# Patient Record
Sex: Male | Born: 1950
Health system: Southern US, Community
[De-identification: ages and names within clinical notes are randomized; demographics above are authoritative.]

## PROBLEM LIST (undated history)

## (undated) DIAGNOSIS — I499 Cardiac arrhythmia, unspecified: Secondary | ICD-10-CM

## (undated) DIAGNOSIS — I10 Essential (primary) hypertension: Secondary | ICD-10-CM

## (undated) DIAGNOSIS — I209 Angina pectoris, unspecified: Secondary | ICD-10-CM

## (undated) DIAGNOSIS — M1990 Unspecified osteoarthritis, unspecified site: Secondary | ICD-10-CM

## (undated) DIAGNOSIS — I4901 Ventricular fibrillation: Secondary | ICD-10-CM

## (undated) DIAGNOSIS — I2510 Atherosclerotic heart disease of native coronary artery without angina pectoris: Secondary | ICD-10-CM

## (undated) DIAGNOSIS — R911 Solitary pulmonary nodule: Secondary | ICD-10-CM

## (undated) DIAGNOSIS — I4819 Other persistent atrial fibrillation: Secondary | ICD-10-CM

## (undated) DIAGNOSIS — G4733 Obstructive sleep apnea (adult) (pediatric): Secondary | ICD-10-CM

## (undated) DIAGNOSIS — I517 Cardiomegaly: Secondary | ICD-10-CM

## (undated) DIAGNOSIS — I77810 Thoracic aortic ectasia: Secondary | ICD-10-CM

## (undated) DIAGNOSIS — I340 Nonrheumatic mitral (valve) insufficiency: Secondary | ICD-10-CM

## (undated) DIAGNOSIS — Z9989 Dependence on other enabling machines and devices: Secondary | ICD-10-CM

## (undated) DIAGNOSIS — E785 Hyperlipidemia, unspecified: Secondary | ICD-10-CM

## (undated) DIAGNOSIS — I351 Nonrheumatic aortic (valve) insufficiency: Secondary | ICD-10-CM

## (undated) HISTORY — PX: CARTILAGE SURGERY: SHX1303

## (undated) HISTORY — PX: KNEE ARTHROSCOPY: SHX127

## (undated) HISTORY — DX: Essential (primary) hypertension: I10

## (undated) HISTORY — PX: TOTAL KNEE ARTHROPLASTY: SHX125

## (undated) HISTORY — PX: RETINAL DETACHMENT SURGERY: SHX105

## (undated) HISTORY — DX: Other persistent atrial fibrillation: I48.19

## (undated) HISTORY — DX: Hyperlipidemia, unspecified: E78.5

## (undated) HISTORY — DX: Nonrheumatic aortic (valve) insufficiency: I35.1

## (undated) HISTORY — PX: JOINT REPLACEMENT: SHX530

## (undated) HISTORY — PX: CORONARY ANGIOPLASTY WITH STENT PLACEMENT: SHX49

## (undated) HISTORY — PX: CATARACT EXTRACTION W/ INTRAOCULAR LENS  IMPLANT, BILATERAL: SHX1307

## (undated) HISTORY — DX: Solitary pulmonary nodule: R91.1

## (undated) HISTORY — DX: Nonrheumatic mitral (valve) insufficiency: I34.0

## (undated) HISTORY — DX: Cardiomegaly: I51.7

## (undated) HISTORY — DX: Thoracic aortic ectasia: I77.810

## (undated) HISTORY — PX: COLONOSCOPY: SHX174

---

## 2014-12-05 DIAGNOSIS — Z23 Encounter for immunization: Secondary | ICD-10-CM | POA: Diagnosis not present

## 2015-03-13 DIAGNOSIS — M542 Cervicalgia: Secondary | ICD-10-CM | POA: Diagnosis not present

## 2015-03-13 DIAGNOSIS — I10 Essential (primary) hypertension: Secondary | ICD-10-CM | POA: Diagnosis not present

## 2015-03-13 DIAGNOSIS — R29898 Other symptoms and signs involving the musculoskeletal system: Secondary | ICD-10-CM | POA: Diagnosis not present

## 2015-03-13 DIAGNOSIS — G4733 Obstructive sleep apnea (adult) (pediatric): Secondary | ICD-10-CM | POA: Diagnosis not present

## 2015-03-16 DIAGNOSIS — I10 Essential (primary) hypertension: Secondary | ICD-10-CM | POA: Diagnosis not present

## 2015-03-24 DIAGNOSIS — I10 Essential (primary) hypertension: Secondary | ICD-10-CM | POA: Diagnosis not present

## 2015-04-04 DIAGNOSIS — G4733 Obstructive sleep apnea (adult) (pediatric): Secondary | ICD-10-CM | POA: Diagnosis not present

## 2015-04-11 DIAGNOSIS — R809 Proteinuria, unspecified: Secondary | ICD-10-CM | POA: Insufficient documentation

## 2015-04-11 DIAGNOSIS — I10 Essential (primary) hypertension: Secondary | ICD-10-CM | POA: Insufficient documentation

## 2015-04-11 DIAGNOSIS — E785 Hyperlipidemia, unspecified: Secondary | ICD-10-CM | POA: Diagnosis not present

## 2015-04-20 DIAGNOSIS — R809 Proteinuria, unspecified: Secondary | ICD-10-CM | POA: Diagnosis not present

## 2015-05-17 DIAGNOSIS — R351 Nocturia: Secondary | ICD-10-CM | POA: Diagnosis not present

## 2015-05-17 DIAGNOSIS — I10 Essential (primary) hypertension: Secondary | ICD-10-CM | POA: Diagnosis not present

## 2015-06-16 ENCOUNTER — Encounter (HOSPITAL_COMMUNITY): Payer: Self-pay

## 2015-06-16 ENCOUNTER — Inpatient Hospital Stay (HOSPITAL_COMMUNITY)
Admission: EM | Admit: 2015-06-16 | Discharge: 2015-06-20 | DRG: 246 | Disposition: A | Discharge status: Home / Self Care | Payer: Medicare Other | Attending: Cardiology | Admitting: Cardiology

## 2015-06-16 ENCOUNTER — Emergency Department (HOSPITAL_COMMUNITY): Payer: Medicare Other

## 2015-06-16 DIAGNOSIS — M7989 Other specified soft tissue disorders: Secondary | ICD-10-CM | POA: Diagnosis not present

## 2015-06-16 DIAGNOSIS — I10 Essential (primary) hypertension: Secondary | ICD-10-CM | POA: Diagnosis not present

## 2015-06-16 DIAGNOSIS — I480 Paroxysmal atrial fibrillation: Secondary | ICD-10-CM | POA: Diagnosis present

## 2015-06-16 DIAGNOSIS — Z955 Presence of coronary angioplasty implant and graft: Secondary | ICD-10-CM

## 2015-06-16 DIAGNOSIS — R911 Solitary pulmonary nodule: Secondary | ICD-10-CM

## 2015-06-16 DIAGNOSIS — I97710 Intraoperative cardiac arrest during cardiac surgery: Secondary | ICD-10-CM | POA: Diagnosis not present

## 2015-06-16 DIAGNOSIS — Z7902 Long term (current) use of antithrombotics/antiplatelets: Secondary | ICD-10-CM

## 2015-06-16 DIAGNOSIS — Z96652 Presence of left artificial knee joint: Secondary | ICD-10-CM | POA: Diagnosis present

## 2015-06-16 DIAGNOSIS — I200 Unstable angina: Secondary | ICD-10-CM | POA: Diagnosis present

## 2015-06-16 DIAGNOSIS — E785 Hyperlipidemia, unspecified: Secondary | ICD-10-CM | POA: Diagnosis present

## 2015-06-16 DIAGNOSIS — Z7982 Long term (current) use of aspirin: Secondary | ICD-10-CM

## 2015-06-16 DIAGNOSIS — I4811 Longstanding persistent atrial fibrillation: Secondary | ICD-10-CM | POA: Diagnosis present

## 2015-06-16 DIAGNOSIS — R079 Chest pain, unspecified: Secondary | ICD-10-CM | POA: Diagnosis not present

## 2015-06-16 DIAGNOSIS — I2510 Atherosclerotic heart disease of native coronary artery without angina pectoris: Secondary | ICD-10-CM

## 2015-06-16 DIAGNOSIS — I4819 Other persistent atrial fibrillation: Secondary | ICD-10-CM | POA: Diagnosis present

## 2015-06-16 DIAGNOSIS — I4901 Ventricular fibrillation: Secondary | ICD-10-CM | POA: Diagnosis present

## 2015-06-16 DIAGNOSIS — Z79899 Other long term (current) drug therapy: Secondary | ICD-10-CM

## 2015-06-16 DIAGNOSIS — I25110 Atherosclerotic heart disease of native coronary artery with unstable angina pectoris: Secondary | ICD-10-CM | POA: Diagnosis not present

## 2015-06-16 DIAGNOSIS — I4891 Unspecified atrial fibrillation: Secondary | ICD-10-CM | POA: Diagnosis not present

## 2015-06-16 DIAGNOSIS — IMO0001 Reserved for inherently not codable concepts without codable children: Secondary | ICD-10-CM

## 2015-06-16 DIAGNOSIS — R0789 Other chest pain: Secondary | ICD-10-CM | POA: Diagnosis not present

## 2015-06-16 HISTORY — DX: Solitary pulmonary nodule: R91.1

## 2015-06-16 HISTORY — DX: Ventricular fibrillation: I49.01

## 2015-06-16 HISTORY — DX: Atherosclerotic heart disease of native coronary artery without angina pectoris: I25.10

## 2015-06-16 HISTORY — DX: Reserved for inherently not codable concepts without codable children: IMO0001

## 2015-06-16 LAB — COMPREHENSIVE METABOLIC PANEL
ALBUMIN: 4.1 g/dL (ref 3.5–5.0)
ALK PHOS: 35 U/L — AB (ref 38–126)
ALT: 38 U/L (ref 17–63)
ANION GAP: 6 (ref 5–15)
AST: 29 U/L (ref 15–41)
BILIRUBIN TOTAL: 0.8 mg/dL (ref 0.3–1.2)
BUN: 26 mg/dL — AB (ref 6–20)
CALCIUM: 8.7 mg/dL — AB (ref 8.9–10.3)
CO2: 25 mmol/L (ref 22–32)
Chloride: 109 mmol/L (ref 101–111)
Creatinine, Ser: 0.96 mg/dL (ref 0.61–1.24)
GFR calc non Af Amer: >60 mL/min (ref >60)
GLUCOSE: 109 mg/dL — AB (ref 65–99)
Potassium: 3.9 mmol/L (ref 3.5–5.1)
Sodium: 140 mmol/L (ref 135–145)
Total Protein: 7 g/dL (ref 6.5–8.1)

## 2015-06-16 LAB — PROTIME-INR
INR: 1.09 (ref 0.00–1.49)
PROTHROMBIN TIME: 14.3 s (ref 11.6–15.2)

## 2015-06-16 LAB — I-STAT TROPONIN, ED
TROPONIN I, POC: 0 ng/mL (ref 0.00–0.08)
Troponin i, poc: 0.01 ng/mL (ref 0.00–0.08)

## 2015-06-16 LAB — CBC WITH DIFFERENTIAL/PLATELET
BASOS PCT: 0 % (ref 0–1)
Basophils Absolute: 0 10*3/uL (ref 0.0–0.1)
EOS PCT: 2 % (ref 0–5)
Eosinophils Absolute: 0.1 10*3/uL (ref 0.0–0.7)
HCT: 40.8 % (ref 39.0–52.0)
HEMOGLOBIN: 13.5 g/dL (ref 13.0–17.0)
LYMPHS PCT: 35 % (ref 12–46)
Lymphs Abs: 2.1 10*3/uL (ref 0.7–4.0)
MCH: 29.2 pg (ref 26.0–34.0)
MCHC: 33.1 g/dL (ref 30.0–36.0)
MCV: 88.1 fL (ref 78.0–100.0)
Monocytes Absolute: 0.5 10*3/uL (ref 0.1–1.0)
Monocytes Relative: 8 % (ref 3–12)
NEUTROS ABS: 3.3 10*3/uL (ref 1.7–7.7)
NEUTROS PCT: 55 % (ref 43–77)
PLATELETS: 152 10*3/uL (ref 150–400)
RBC: 4.63 MIL/uL (ref 4.22–5.81)
RDW: 13.7 % (ref 11.5–15.5)
WBC: 6 10*3/uL (ref 4.0–10.5)

## 2015-06-16 LAB — TROPONIN I
Troponin I: <0.03 ng/mL (ref <0.031)
Troponin I: <0.03 ng/mL (ref <0.031)
Troponin I: <0.03 ng/mL (ref <0.031)

## 2015-06-16 LAB — BRAIN NATRIURETIC PEPTIDE: B Natriuretic Peptide: 314.3 pg/mL — ABNORMAL HIGH (ref 0.0–100.0)

## 2015-06-16 LAB — TSH: TSH: 0.899 u[IU]/mL (ref 0.350–4.500)

## 2015-06-16 LAB — APTT: APTT: 31 s (ref 24–37)

## 2015-06-16 LAB — HEPARIN LEVEL (UNFRACTIONATED): HEPARIN UNFRACTIONATED: 0.22 [IU]/mL — AB (ref 0.30–0.70)

## 2015-06-16 IMAGING — CT CT ANGIO CHEST
2 of 6 series · 17 of 46 positions shown · IV contrast (omnipaque)
Comparison: No priors.

CLINICAL DATA: 63-year-old male with chest tightness and shortness
of breath. Feet swelling.

EXAM:
CT ANGIOGRAPHY CHEST WITH CONTRAST
TECHNIQUE: Multidetector CT imaging of the chest was performed using the
standard protocol during bolus administration of intravenous
contrast. Multiplanar CT image reconstructions and MIPs were
obtained to evaluate the vascular anatomy.
CONTRAST:  100mL OMNIPAQUE IOHEXOL 350 MG/ML SOLN

[Series 6: thins for pacs · axial · 0.88mm/px · z∈[-395,-106]mm · 14 of 317 slices shown]
[im 14/317  lung]
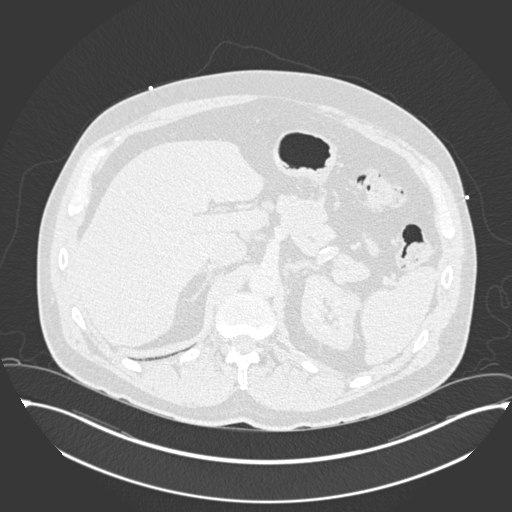
[im 42/317  soft-tissue]
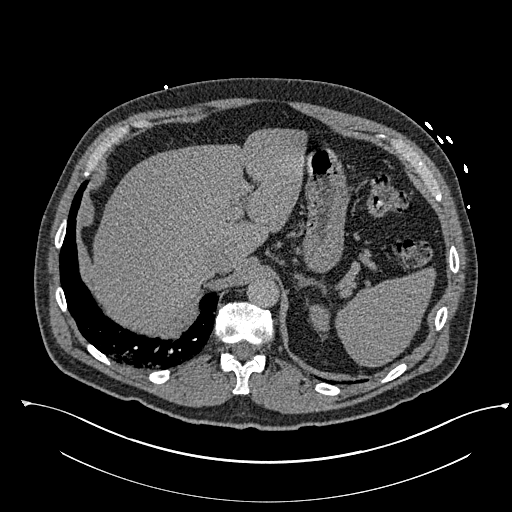
[im 55/317  lung]
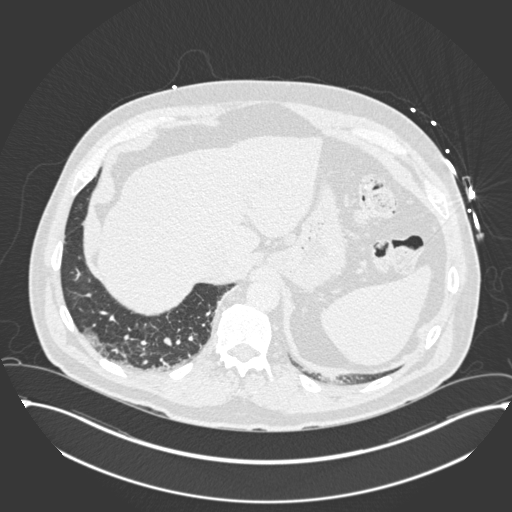
[im 83/317  soft-tissue]
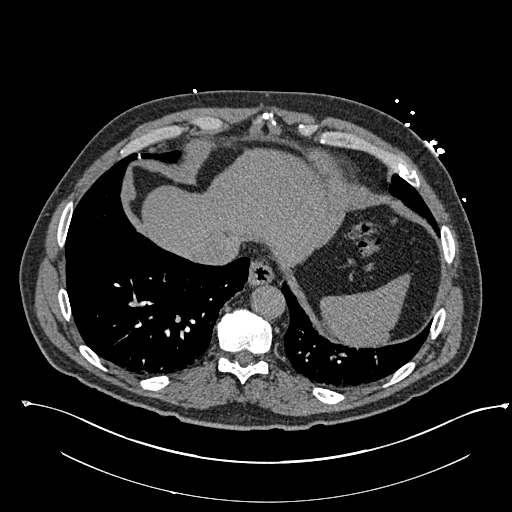
[im 110/317  lung]
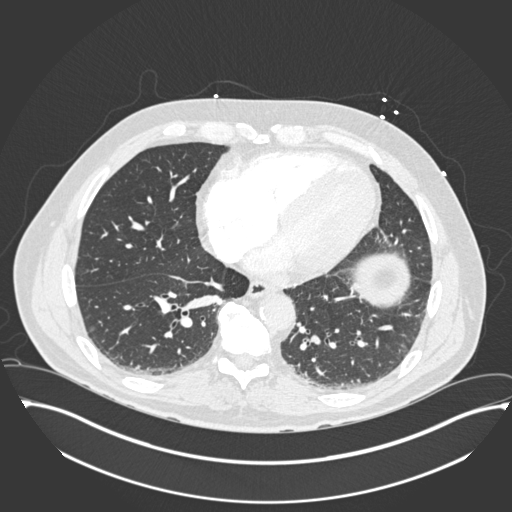
[im 124/317  soft-tissue]
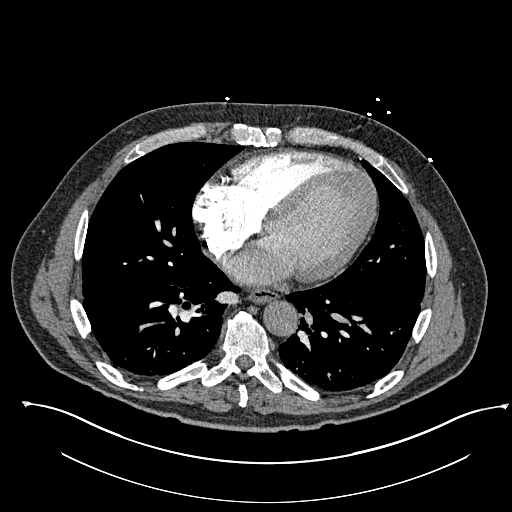
[im 152/317  lung]
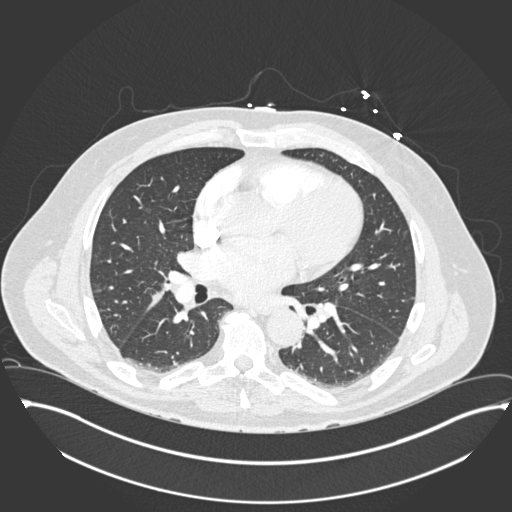
[im 165/317  soft-tissue]
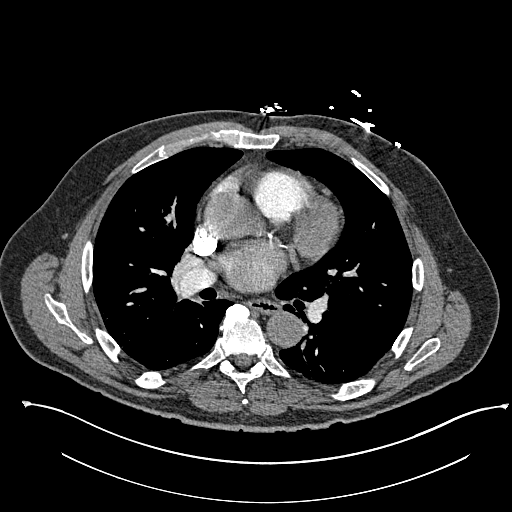
[im 193/317  lung]
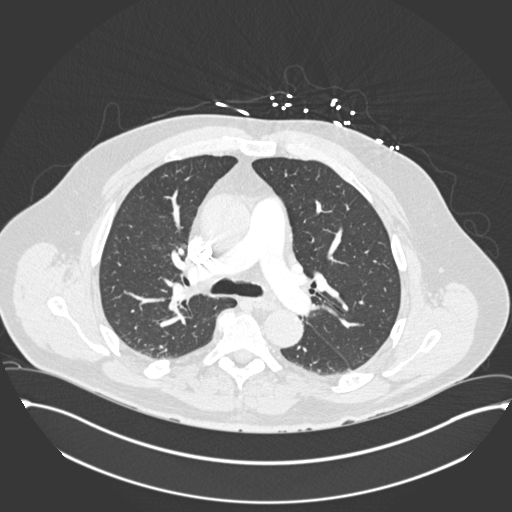
[im 207/317  soft-tissue]
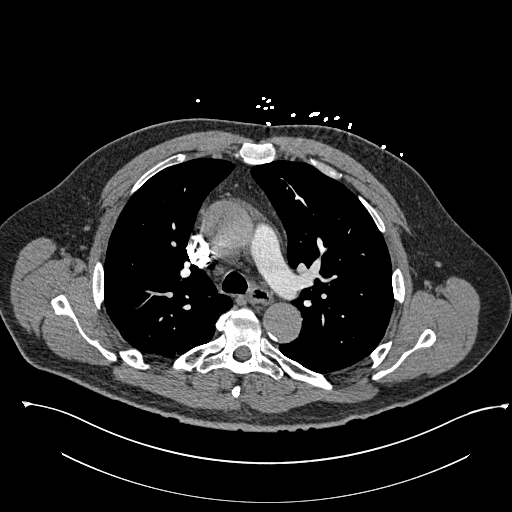
[im 234/317  lung]
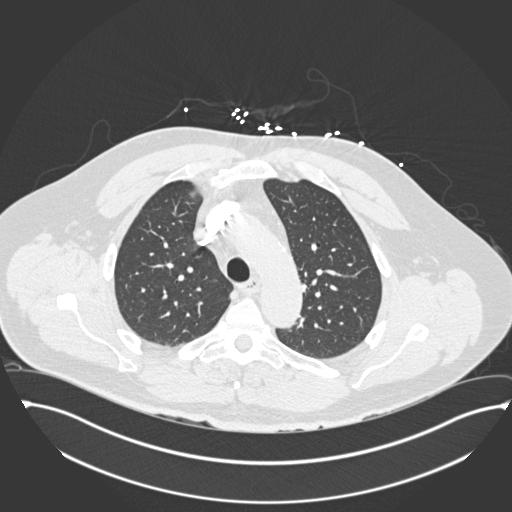
[im 262/317  soft-tissue]
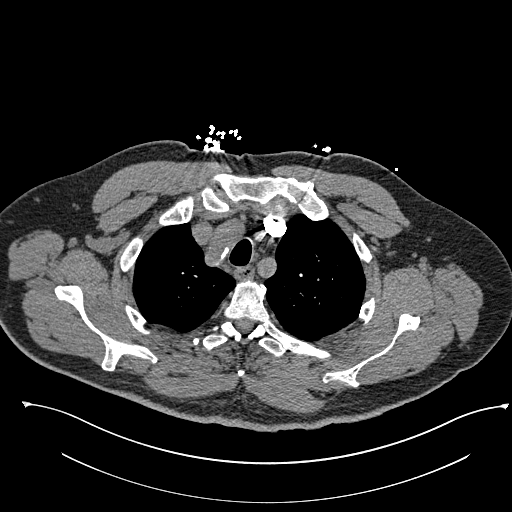
[im 275/317  lung]
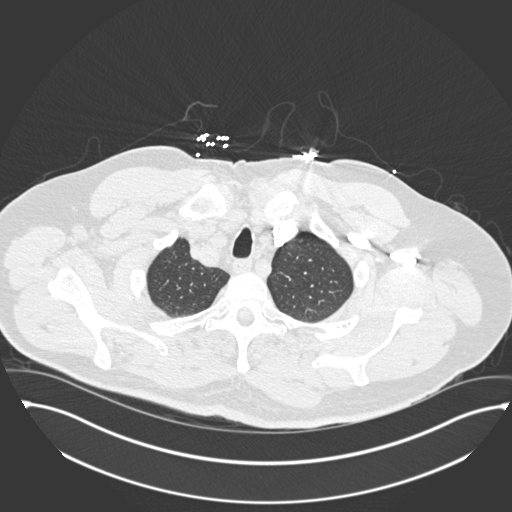
[im 303/317  soft-tissue]
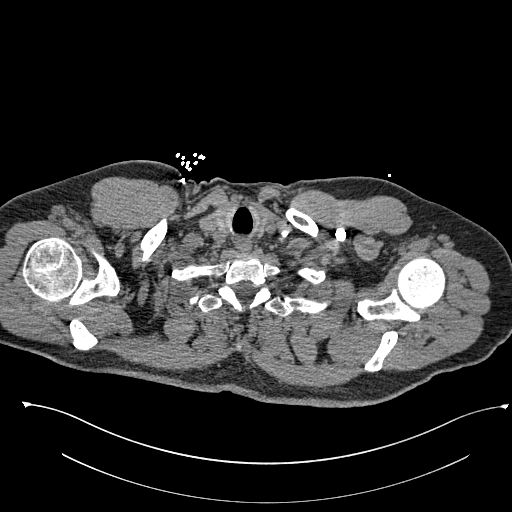

[Series 8: coronal mpr · coronal · 0.62mm/px · 3 of 151 slices shown]
[im 38/151  soft-tissue]
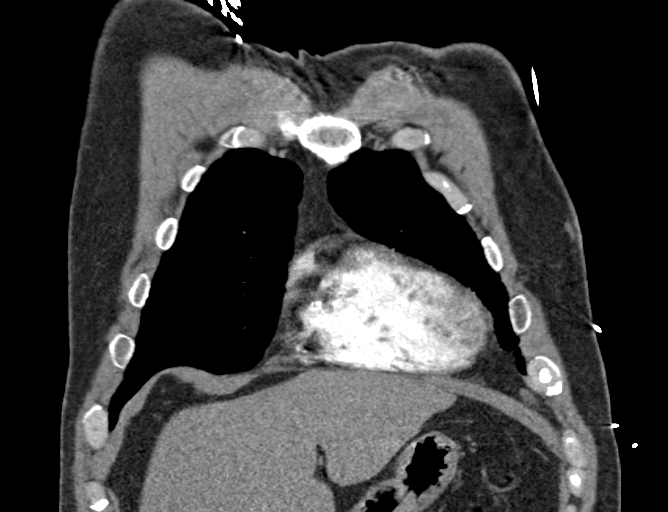
[im 76/151  soft-tissue]
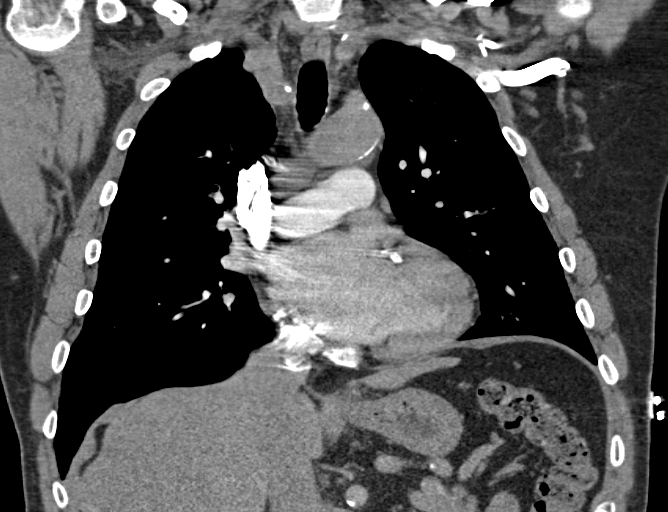
[im 113/151  soft-tissue]
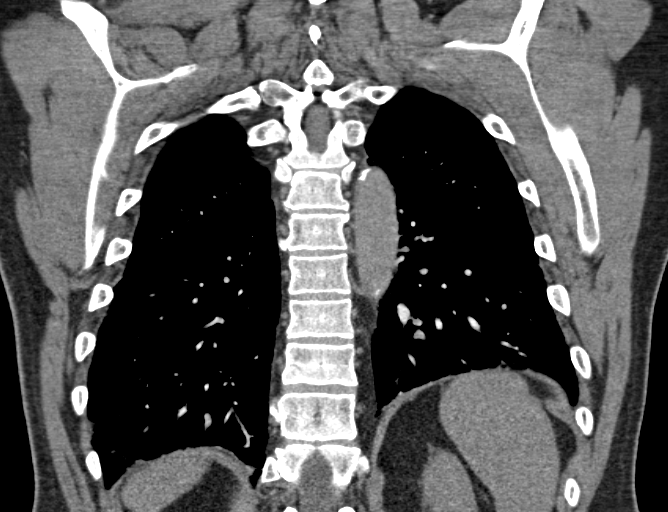

[17 of 46 positions shown; findings below may reference images not displayed]

FINDINGS: Mediastinum/Lymph Nodes: There are no filling defects within the
pulmonary arterial tree to suggest underlying pulmonary embolism.
Heart size is borderline enlarged. There is no significant
pericardial fluid, thickening or pericardial calcification. There is
atherosclerosis of the thoracic aorta, the great vessels of the
mediastinum and the coronary arteries, including calcified
atherosclerotic plaque in the left main, left anterior descending,
left circumflex and right coronary arteries. No pathologically
enlarged mediastinal or hilar lymph nodes. Esophagus is unremarkable
in appearance. No axillary lymphadenopathy.

Lungs/Pleura: In the central aspect of the right lower lobe (image
60 of series 7) there is a 1.5 x 1.2 cm smoothly marginated nodule.
No acute consolidative airspace disease. No pleural effusions. Mild
dependent subsegmental atelectasis is noted in the lower lobes of
the lungs bilaterally.

Upper Abdomen: Unremarkable.

Musculoskeletal/Soft Tissues: There are no aggressive appearing
lytic or blastic lesions noted in the visualized portions of the
skeleton.

Review of the MIP images confirms the above findings.
IMPRESSION: 1. No evidence of pulmonary embolism.
2. No acute findings in the thorax to account for the patient's
symptoms.
3. 1.5 x 1.2 cm smoothly marginated nodule in the central aspect of
the right lower lobe (image 60 of series 7). Correlation with
nonemergent PET-CT is recommended in the near future to exclude
neoplasm.
4. Atherosclerosis, including left main and 3 vessel coronary artery
disease. Please note that although the presence of coronary artery
calcium documents the presence of coronary artery disease, the
severity of this disease and any potential stenosis cannot be
assessed on this non-gated CT examination. Assessment for potential
risk factor modification, dietary therapy or pharmacologic therapy
may be warranted, if clinically indicated.

## 2015-06-16 MED ORDER — SODIUM CHLORIDE 0.9 % IV SOLN: INTRAVENOUS | Status: DC

## 2015-06-16 MED ORDER — HEPARIN BOLUS VIA INFUSION
4000.0000 [IU] | Freq: Once | INTRAVENOUS | Status: AC
  Administered 2015-06-16: 4000 [IU] via INTRAVENOUS
  Filled 2015-06-16: qty 4000

## 2015-06-16 MED ORDER — ATORVASTATIN CALCIUM 10 MG PO TABS
10.0000 mg | ORAL_TABLET | Freq: Every day | ORAL | Status: DC
  Administered 2015-06-16: 10 mg via ORAL
  Filled 2015-06-16: qty 1

## 2015-06-16 MED ORDER — HEPARIN (PORCINE) IN NACL 100-0.45 UNIT/ML-% IJ SOLN
1350.0000 [IU]/h | INTRAMUSCULAR | Status: DC
  Administered 2015-06-16 – 2015-06-17 (×2): 1350 [IU]/h via INTRAVENOUS
  Filled 2015-06-16: qty 250

## 2015-06-16 MED ORDER — SODIUM CHLORIDE 0.9 % IV SOLN
INTRAVENOUS | Status: DC
  Administered 2015-06-16 – 2015-06-18 (×6): via INTRAVENOUS

## 2015-06-16 MED ORDER — ONDANSETRON HCL 4 MG/2ML IJ SOLN
4.0000 mg | Freq: Four times a day (QID) | INTRAMUSCULAR | Status: DC | PRN
Start: 2015-06-16 — End: 2015-06-20

## 2015-06-16 MED ORDER — SODIUM CHLORIDE 0.9 % IV BOLUS (SEPSIS)
500.0000 mL | Freq: Once | INTRAVENOUS | Status: AC
  Administered 2015-06-16: 500 mL via INTRAVENOUS

## 2015-06-16 MED ORDER — METOPROLOL TARTRATE 25 MG PO TABS
25.0000 mg | ORAL_TABLET | Freq: Two times a day (BID) | ORAL | Status: DC
  Administered 2015-06-16 (×2): 25 mg via ORAL
  Filled 2015-06-16 (×2): qty 1

## 2015-06-16 MED ORDER — MORPHINE SULFATE 2 MG/ML IJ SOLN
2.0000 mg | INTRAMUSCULAR | Status: DC | PRN
  Administered 2015-06-18 – 2015-06-19 (×3): 2 mg via INTRAVENOUS
  Filled 2015-06-16 (×3): qty 1

## 2015-06-16 MED ORDER — ASPIRIN EC 325 MG PO TBEC
325.0000 mg | DELAYED_RELEASE_TABLET | Freq: Every day | ORAL | Status: DC
  Filled 2015-06-16: qty 1

## 2015-06-16 MED ORDER — HEPARIN (PORCINE) IN NACL 100-0.45 UNIT/ML-% IJ SOLN
1200.0000 [IU]/h | INTRAMUSCULAR | Status: DC
  Administered 2015-06-16: 1200 [IU]/h via INTRAVENOUS
  Filled 2015-06-16: qty 250

## 2015-06-16 MED ORDER — LOSARTAN POTASSIUM 50 MG PO TABS
100.0000 mg | ORAL_TABLET | Freq: Every day | ORAL | Status: DC
  Filled 2015-06-16: qty 2

## 2015-06-16 MED ORDER — GI COCKTAIL ~~LOC~~
30.0000 mL | Freq: Four times a day (QID) | ORAL | Status: DC | PRN
  Administered 2015-06-18 – 2015-06-19 (×2): 30 mL via ORAL
  Filled 2015-06-16 (×5): qty 30

## 2015-06-16 MED ORDER — NITROGLYCERIN 0.4 MG SL SUBL
0.4000 mg | SUBLINGUAL_TABLET | SUBLINGUAL | Status: DC | PRN
  Administered 2015-06-16: 0.4 mg via SUBLINGUAL
  Filled 2015-06-16: qty 1

## 2015-06-16 MED ORDER — SODIUM CHLORIDE (HYPERTONIC) 2 % OP SOLN: 1.0000 [drp] | OPHTHALMIC | Status: DC | PRN

## 2015-06-16 MED ORDER — OMEGA-3-ACID ETHYL ESTERS 1 G PO CAPS
1.0000 g | ORAL_CAPSULE | Freq: Two times a day (BID) | ORAL | Status: DC
  Administered 2015-06-16 – 2015-06-20 (×8): 1 g via ORAL
  Filled 2015-06-16 (×8): qty 1

## 2015-06-16 MED ORDER — FISH OIL 500 MG PO CAPS: 2000.0000 mg | ORAL_CAPSULE | Freq: Every day | ORAL | Status: DC

## 2015-06-16 MED ORDER — ACETAMINOPHEN 325 MG PO TABS: 650.0000 mg | ORAL_TABLET | ORAL | Status: DC | PRN

## 2015-06-16 MED ORDER — IOHEXOL 350 MG/ML SOLN
100.0000 mL | Freq: Once | INTRAVENOUS | Status: AC | PRN
  Administered 2015-06-16: 100 mL via INTRAVENOUS

## 2015-06-16 NOTE — ED Notes (Signed)
Awake. Verbally responsive. A/O x4. Resp even and unlabored. No audible adventitious breath sounds noted. ABC's intact. A.FIb on monitor. Family at bedside.

## 2015-06-16 NOTE — ED Provider Notes (Signed)
CSN: [id number]     Arrival date & time 06/16/15  0808 History   First MD Initiated Contact with Patient 06/16/15 0830     Chief Complaint  Patient presents with  . Chest Pain  . Shortness of Breath  . Foot Swelling     (Consider location/radiation/quality/duration/timing/severity/associated sxs/prior Treatment) HPI Troy [name] is a 64 y.o. male with history of hypertension and coronary artery disease, presents to emergency department complaining of chest tightness, shortness of breath, dizziness onset yesterday. Patient states he is watching television when his chest tightness began. He states pain is in the center of the chest, it is worse with deep breathing. He states he is unable to walk but 10 steps before getting short of breath. He states he has some dizziness as well. He denies diaphoresis. He denies cough or chest congestion. He denies nausea, vomiting. He states symptoms feel like when he had his stent placed approximately 8 years ago in [place]. He states he has not seen cardiologist or primary care doctor several years. He states he did just have physical few months ago and everything was normal. He denies recent stress test. He denies any recent EKGs. He states he has noticed over last several months that his heart rate would be high even without exerting himself. He denies palpitations however. He states that he travels a lot by car and has been driving back and forth to [place]. He reports mild swelling in bilateral legs. He denies any calf pain. Patient take 700 mg of aspirin this morning prior to coming in.  Past Medical History  Diagnosis Date  . Hypertension    Past Surgical History  Procedure Laterality Date  . Replacement total knee Left   . Coronary angioplasty with stent placement     History reviewed. No pertinent family history. History  Substance Use Topics  . Smoking status: Never Smoker   . Smokeless tobacco: Not on file  . Alcohol Use: Yes   Comment: occ    Review of Systems  Constitutional: Negative for fever and chills.  Respiratory: Positive for chest tightness and shortness of breath. Negative for cough.   Cardiovascular: Positive for chest pain and leg swelling. Negative for palpitations.  Gastrointestinal: Negative for nausea, vomiting, abdominal pain, diarrhea and abdominal distention.  Genitourinary: Negative for dysuria, urgency, frequency and hematuria.  Musculoskeletal: Negative for myalgias, arthralgias, neck pain and neck stiffness.  Skin: Negative for rash.  Allergic/Immunologic: Negative for immunocompromised state.  Neurological: Positive for dizziness and light-headedness. Negative for weakness, numbness and headaches.  All other systems reviewed and are negative.     Allergies  Review of patient's allergies indicates not on file.  Home Medications   Prior to Admission medications   Not on File   BP 151/103 mmHg  Pulse 118  Temp(Src) 97.5 F (36.4 C) (Oral)  Resp 19  SpO2 94% Physical Exam  Constitutional: He is oriented to person, place, and time. He appears well-developed and well-nourished. No distress.  HENT:  Head: Normocephalic and atraumatic.  Eyes: Conjunctivae are normal.  Neck: Neck supple.  Cardiovascular: An irregularly irregular rhythm present. Tachycardia present.   No murmur heard. Pulmonary/Chest: Effort normal. No respiratory distress. He has no wheezes. He has no rales.  Abdominal: Soft. Bowel sounds are normal. He exhibits no distension. There is no tenderness. There is no rebound.  Musculoskeletal: He exhibits no edema.  Neurological: He is alert and oriented to person, place, and time.  Skin: Skin is warm and dry.  Nursing note and vitals reviewed.   ED Course  Procedures (including critical care time) Labs Review Labs Reviewed  COMPREHENSIVE METABOLIC PANEL - Abnormal; Notable for the following:    Glucose, Bld 109 (*)    BUN 26 (*)    Calcium 8.7 (*)     Alkaline Phosphatase 35 (*)    All other components within normal limits  BRAIN NATRIURETIC PEPTIDE - Abnormal; Notable for the following:    B Natriuretic Peptide 314.3 (*)    All other components within normal limits  CBC WITH DIFFERENTIAL/PLATELET  I-STAT TROPOININ, ED  I-STAT TROPOININ, ED    Imaging Review Ct Angio Chest Pe W/cm &/or Wo Cm  06/16/2015   CLINICAL DATA:  64 year old male with chest tightness and shortness of breath. Feet swelling.  EXAM: CT ANGIOGRAPHY CHEST WITH CONTRAST  TECHNIQUE: Multidetector CT imaging of the chest was performed using the standard protocol during bolus administration of intravenous contrast. Multiplanar CT image reconstructions and MIPs were obtained to evaluate the vascular anatomy.  CONTRAST:  140mL OMNIPAQUE IOHEXOL 350 MG/ML SOLN  COMPARISON:  No priors.  FINDINGS: Mediastinum/Lymph Nodes: There are no filling defects within the pulmonary arterial tree to suggest underlying pulmonary embolism. Heart size is borderline enlarged. There is no significant pericardial fluid, thickening or pericardial calcification. There is atherosclerosis of the thoracic aorta, the great vessels of the mediastinum and the coronary arteries, including calcified atherosclerotic plaque in the left main, left anterior descending, left circumflex and right coronary arteries. No pathologically enlarged mediastinal or hilar lymph nodes. Esophagus is unremarkable in appearance. No axillary lymphadenopathy.  Lungs/Pleura: In the central aspect of the right lower lobe (image 60 of series 7) there is a 1.5 x 1.2 cm smoothly marginated nodule. No acute consolidative airspace disease. No pleural effusions. Mild dependent subsegmental atelectasis is noted in the lower lobes of the lungs bilaterally.  Upper Abdomen: Unremarkable.  Musculoskeletal/Soft Tissues: There are no aggressive appearing lytic or blastic lesions noted in the visualized portions of the skeleton.  Review of the MIP images  confirms the above findings.  IMPRESSION: 1. No evidence of pulmonary embolism. 2. No acute findings in the thorax to account for the patient's symptoms. 3. 1.5 x 1.2 cm smoothly marginated nodule in the central aspect of the right lower lobe (image 60 of series 7). Correlation with nonemergent PET-CT is recommended in the near future to exclude neoplasm. 4. Atherosclerosis, including left main and 3 vessel coronary artery disease. Please note that although the presence of coronary artery calcium documents the presence of coronary artery disease, the severity of this disease and any potential stenosis cannot be assessed on this non-gated CT examination. Assessment for potential risk factor modification, dietary therapy or pharmacologic therapy may be warranted, if clinically indicated.   Electronically Signed   By: Vinnie Langton M.D.   On: 06/16/2015 11:14     EKG Interpretation   Date/Time:  Saturday June 16 2015 08:19:23 EDT Ventricular Rate:  109 PR Interval:    QRS Duration: 105 QT Interval:  361 QTC Calculation: 486 R Axis:   45 Text Interpretation:  Atrial fibrillation Borderline prolonged QT interval  Non-specific abnormality, ST segment, and/or T-wave No previous ECGs  available Confirmed by KNOTT MD, DANIEL ([id number]) on 06/16/2015 8:44:56 AM      MDM   Final diagnoses:  Chest pain, unspecified chest pain type  Atrial fibrillation, unspecified    Patient emergency department complaining of chest tightness, pleuritic pain, shortness of breath, dizziness. Patient travels  long distances by car, he is tachycardic, oxygen saturation in low 90s on the monitor, concerning for possible PE. Will get CT angio of chest. Patient also was found to be in new onset of atrial fibrillation. Unclear how long he has been in this rhythm. Will check labs including troponin, BNP, patient took 700 mg aspirin this morning.  11:45 AM Spoke with Dr. Aundra Dubin, asked medicine to admit, they will consult  tomorrow morning. Pt's VS normal, pain improved with nitro.  Pt's CT as above, no PE. Will get delta trop, call cardiology. Symptoms concerning for unstable angina.  Spoke with cardiology, though consult tomorrow. Asked medicine to admit. Will call triad.      Jeannett Senior, PA-C 06/17/15 1954  Leo Grosser, MD 06/17/15 [phone]

## 2015-06-16 NOTE — ED Notes (Signed)
Awake. Verbally responsive. A/O x4. Resp even and unlabored. No audible adventitious breath sounds noted. ABC's intact. A. Fib on monitor. IV saline lock patent and intact. Family at bedside.

## 2015-06-16 NOTE — Progress Notes (Signed)
ANTICOAGULATION CONSULT NOTE - Initial Consult  Pharmacy Consult for heparin drip.  Indication: chest pain/ACS  Allergies  Allergen Reactions  . Gluten Meal Other (See Comments)    Celiac disease  . Whey     Celiac disease  . Tizanidine Anxiety    Depression    Patient Measurements: Height: 5\' 11"  (180.3 cm) Weight: 241 lb (109.317 kg) IBW/kg (Calculated) : 75.3 Heparin Dosing Weight: 100.7 kg   Vital Signs: Temp: 97.6 F (36.4 C) (08/06 1351) Temp Source: Oral (08/06 1351) BP: 125/89 mmHg (08/06 1351) Pulse Rate: 89 (08/06 1351)  Labs:  Recent Labs  06/16/15 0850  HGB 13.5  HCT 40.8  PLT 152  CREATININE 0.96    Estimated Creatinine Clearance: 96.5 mL/min (by C-G formula based on Cr of 0.96).   Medical History: Past Medical History  Diagnosis Date  . Hypertension     Medications:  Scheduled:  . aspirin EC  325 mg Oral Daily  . atorvastatin  10 mg Oral q1800  . heparin  4,000 Units Intravenous Once  . losartan  100 mg Oral Daily  . metoprolol tartrate  25 mg Oral BID  . omega-3 acid ethyl esters  1 g Oral BID    Assessment: Pt is  64 y.o. male with a PMH of establishedcoronary artery disease undergoing percutaneous intervention in 2010 and [place], [place]. Pt complains of chest discomfort that has become worse in last 24 hours. Concern for unstable angina. Pt also has been newly diagnosed with atrial fibrillation. No PTA anti-coagulants. Pt on aspirin as outpatient. CBC WNL. APTT, PT/INR WNL.     Goal of Therapy:  Heparin level 0.3-0.7 units/ml Monitor platelets by anticoagulation protocol: Yes   Plan:  Give 4000 units bolus x 1, and start heparin infusion at 1200 units/hr. Will order anti-Xa 6 hours after start of infusion and has been ordered daily. CBC ordered daily. Abigail Butts Lynkin Saini 06/16/15, 14:40

## 2015-06-16 NOTE — ED Notes (Signed)
Patient transported to CT 

## 2015-06-16 NOTE — Progress Notes (Signed)
ANTICOAGULATION CONSULT NOTE - Follow Up Consult  Pharmacy Consult for Heparin Indication: chest pain/ACS and atrial fibrillation  Allergies  Allergen Reactions  . Gluten Meal Other (See Comments)    Celiac disease  . Whey     Celiac disease  . Tizanidine Anxiety    Depression    Patient Measurements: Height: 5\' 11"  (180.3 cm) Weight: 241 lb (109.317 kg) IBW/kg (Calculated) : 75.3 Heparin Dosing Weight:   Vital Signs: Temp: 98.4 F (36.9 C) (08/06 2126) Temp Source: Oral (08/06 2126) BP: 119/81 mmHg (08/06 2126) Pulse Rate: 103 (08/06 2126)  Labs:  Recent Labs  06/16/15 0850 06/16/15 1406 06/16/15 1418 06/16/15 1708 06/16/15 1945 06/16/15 2116  HGB 13.5  --   --   --   --   --   HCT 40.8  --   --   --   --   --   PLT 152  --   --   --   --   --   APTT  --   --  31  --   --   --   LABPROT  --   --  14.3  --   --   --   INR  --   --  1.09  --   --   --   HEPARINUNFRC  --   --   --   --   --  0.22*  CREATININE 0.96  --   --   --   --   --   TROPONINI  --  <0.03  --  <0.03 <0.03  --     Estimated Creatinine Clearance: 96.5 mL/min (by C-G formula based on Cr of 0.96).   Medications:  Infusions:  . sodium chloride 125 mL/hr at 06/16/15 2319  . heparin 1,350 Units/hr (06/16/15 2319)    Assessment: Patient with heparin level low.  No heparin issues per RN.   Goal of Therapy:  Heparin level 0.3-0.7 units/ml Monitor platelets by anticoagulation protocol: Yes   Plan:  Increase heparin to 1350 units/hr Recheck level at [place], Shea Stakes [place] 06/16/2015,11:22 PM

## 2015-06-16 NOTE — ED Notes (Signed)
Pt c/o central chest tightness, stabbing chest pain w/ deep breathing, SOB w/ exertion, and BLE swelling x 1 day.  Pain score 5/10.  Pt reports that he was watching tv when the symptoms started.  Pt reports having stents placed 5-6 years ago.

## 2015-06-16 NOTE — H&P (Addendum)
[facility] History and Physical  Troy [name] DXA:[id number] DOB: January 19, 1951 DOA: 06/16/2015  Referring physician:  PCP: No PCP Per Patient   Chief Complaint: Chest Pain  HPI: Troy [name] is a 64 y.o. male with a past medical history of establishedcoronary artery disease undergoing percutaneous intervention in 2010 and [place], [place]. He presents to the emergency department with complaints of intermittent chest pain and shortness of breath the past 2-3 months becoming worse in the past 24 hours. He complains of retrosternal chest pain characterized as tight/pressure like, radiating to his right shoulder, precipitated by physical exertion. He also complains of significant dyspnea on exertion and states becoming significantly short of breath with ambulating 10-20 feet. He denies associated nausea or vomiting. In the emergency department he was found to be in atrial fibrillation. Workup included a CT scan of lungs with IV contrast which not reveal evidence for pulmonary embolism. He states that current symptoms are similar to symptoms he had back in 2010 and led to his cardiac catheterization.                                                                                                                           Review of Systems:  Constitutional:  No weight loss, night sweats, Fevers, chills, fatigue.  HEENT:  No headaches, Difficulty swallowing,Tooth/dental problems,Sore throat,  No sneezing, itching, ear ache, nasal congestion, post nasal drip,  Cardio-vascular:  Positive for chest pain, Orthopnea, PND, swelling in lower extremities, anasarca, dizziness, palpitations  GI:  No heartburn, indigestion, abdominal pain, nausea, vomiting, diarrhea, change in bowel habits, loss of appetite  Resp:  Positive for shortness of breath with exertion or at rest. No excess mucus, no productive cough, No non-productive cough, No coughing up of blood.No change in color of mucus.No  wheezing.No chest wall deformity  Skin:  no rash or lesions.  GU:  no dysuria, change in color of urine, no urgency or frequency. No flank pain.  Musculoskeletal:  No joint pain or swelling. No decreased range of motion. No back pain.  Psych:  No change in mood or affect. No depression or anxiety. No memory loss.   Past Medical History  Diagnosis Date  . Hypertension    Past Surgical History  Procedure Laterality Date  . Replacement total knee Left   . Coronary angioplasty with stent placement     Social History:  reports that he has never smoked. He does not have any smokeless tobacco history on file. He reports that he drinks alcohol. He reports that he does not use illicit drugs.  Allergies  Allergen Reactions  . Whey     Celiac disease  . Tizanidine Anxiety    Depression    History reviewed. No pertinent family history.   Prior to Admission medications   Medication Sig Start Date End Date Taking? Authorizing Provider  aspirin 325 MG EC tablet Take 700 mg by mouth once.   Yes Historical Provider, MD  atenolol (TENORMIN)  25 MG tablet Take 25 mg by mouth daily.   Yes Historical Provider, MD  losartan-hydrochlorothiazide (HYZAAR) 100-25 MG per tablet Take 1 tablet by mouth daily.   Yes Historical Provider, MD  Omega-3 Fatty Acids (FISH OIL) 500 MG CAPS Take 2,000 mg by mouth daily.    Yes Historical Provider, MD  sodium chloride (MURO 128) 2 % ophthalmic solution Place 1 drop into both eyes every 4 (four) hours as needed for irritation.   Yes Historical Provider, MD  atorvastatin (LIPITOR) 10 MG tablet Take 10 mg by mouth daily.  04/11/15 04/10/16  Historical Provider, MD   Physical Exam: Filed Vitals:   06/16/15 1030 06/16/15 1100 06/16/15 1115 06/16/15 1130  BP: 122/78 129/90 115/65 130/98  Pulse: 88 95 96 79  Temp:    97.5 F (36.4 C)  TempSrc:    Oral  Resp: 0 14 17 14   SpO2: 88% 94% 89% 94%    Wt Readings from Last 3 Encounters:  No data found for Wt     General:  Appears calm and comfortable, currently in no acute distress Eyes: PERRL, normal lids, irises & conjunctiva ENT: grossly normal hearing, lips & tongue Neck: no LAD, masses or thyromegaly Cardiovascular: Tachycardic, regular rate and rhythm, no m/r/g. His left ribs and right pedal edema Telemetry: SR, no arrhythmias  Respiratory: CTA bilaterally, no w/r/r. Normal respiratory effort. Abdomen: soft, ntnd Skin: no rash or induration seen on limited exam Musculoskeletal: grossly normal tone BUE/BLE Psychiatric: grossly normal mood and affect, speech fluent and appropriate Neurologic: grossly non-focal.          Labs on Admission:  Basic Metabolic Panel:  Recent Labs Lab 06/16/15 0850  NA 140  K 3.9  CL 109  CO2 25  GLUCOSE 109*  BUN 26*  CREATININE 0.96  CALCIUM 8.7*   Liver Function Tests:  Recent Labs Lab 06/16/15 0850  AST 29  ALT 38  ALKPHOS 35*  BILITOT 0.8  PROT 7.0  ALBUMIN 4.1   No results for input(s): LIPASE, AMYLASE in the last 168 hours. No results for input(s): AMMONIA in the last 168 hours. CBC:  Recent Labs Lab 06/16/15 0850  WBC 6.0  NEUTROABS 3.3  HGB 13.5  HCT 40.8  MCV 88.1  PLT 152   Cardiac Enzymes: No results for input(s): CKTOTAL, CKMB, CKMBINDEX, TROPONINI in the last 168 hours.  BNP (last 3 results)  Recent Labs  06/16/15 0850  BNP 314.3*    ProBNP (last 3 results) No results for input(s): PROBNP in the last 8760 hours.  CBG: No results for input(s): GLUCAP in the last 168 hours.  Radiological Exams on Admission: Ct Angio Chest Pe W/cm &/or Wo Cm  06/16/2015   CLINICAL DATA:  64 year old male with chest tightness and shortness of breath. Feet swelling.  EXAM: CT ANGIOGRAPHY CHEST WITH CONTRAST  TECHNIQUE: Multidetector CT imaging of the chest was performed using the standard protocol during bolus administration of intravenous contrast. Multiplanar CT image reconstructions and MIPs were obtained to evaluate  the vascular anatomy.  CONTRAST:  168mL OMNIPAQUE IOHEXOL 350 MG/ML SOLN  COMPARISON:  No priors.  FINDINGS: Mediastinum/Lymph Nodes: There are no filling defects within the pulmonary arterial tree to suggest underlying pulmonary embolism. Heart size is borderline enlarged. There is no significant pericardial fluid, thickening or pericardial calcification. There is atherosclerosis of the thoracic aorta, the great vessels of the mediastinum and the coronary arteries, including calcified atherosclerotic plaque in the left main, left anterior descending, left circumflex  and right coronary arteries. No pathologically enlarged mediastinal or hilar lymph nodes. Esophagus is unremarkable in appearance. No axillary lymphadenopathy.  Lungs/Pleura: In the central aspect of the right lower lobe (image 60 of series 7) there is a 1.5 x 1.2 cm smoothly marginated nodule. No acute consolidative airspace disease. No pleural effusions. Mild dependent subsegmental atelectasis is noted in the lower lobes of the lungs bilaterally.  Upper Abdomen: Unremarkable.  Musculoskeletal/Soft Tissues: There are no aggressive appearing lytic or blastic lesions noted in the visualized portions of the skeleton.  Review of the MIP images confirms the above findings.  IMPRESSION: 1. No evidence of pulmonary embolism. 2. No acute findings in the thorax to account for the patient's symptoms. 3. 1.5 x 1.2 cm smoothly marginated nodule in the central aspect of the right lower lobe (image 60 of series 7). Correlation with nonemergent PET-CT is recommended in the near future to exclude neoplasm. 4. Atherosclerosis, including left main and 3 vessel coronary artery disease. Please note that although the presence of coronary artery calcium documents the presence of coronary artery disease, the severity of this disease and any potential stenosis cannot be assessed on this non-gated CT examination. Assessment for potential risk factor modification, dietary  therapy or pharmacologic therapy may be warranted, if clinically indicated.   Electronically Signed   By: Vinnie Langton M.D.   On: 06/16/2015 11:14    EKG: Independently reviewed. A. fib  Assessment/Plan Active Problems:   A-fib   Chest pain   Essential hypertension   1. Suspected unstable angina. Patient with established history of coronary artery disease presenting with chest pain having typical features. He describes chest pressure occurring intermittently over the past several months worse in the last 24 hours. He reports having significant exertional dyspnea and states current symptoms are similar to symptoms back in 2010. He has an established history of coronary artery disease undergoing cardiac catheterization in 2010 with percutaneous intervention: Procedure was done in [place], [place] at [facility]. Will place patient on IV heparin with performance in consultation. Will continue metoprolol, Cozaar, Lipitor, aspirin and as needed nitroglycerin. Cardiology consulted. 2. Newly diagnosed atrial fibrillation. Patient presented with chest with evidence of symptoms, felt to be in A. fib in the emergency department. He was unaware of having a history of atrial fibrillation. Have ordered a TSH along with transthoracic echocardiogram. Cardiology has been consulted as a concern for possibility of unstable angina. Will consider beta blocker therapy with metoprolol 25 mg by mouth twice a day. Admit to telemetry. 3. Hypertension. We'll place him on metoprolol 25 mg by mouth twice a day along with valsartan 1 mg by mouth daily.  Blood pressures are stable in the emergency department. 4. Dyslipidemia. Check fasting lipid panel, continue Lipitor 10 mg by mouth daily 5. Lung Nodule. Radiology reporting a 1.5 x 1.2 cm nodule, recommending PET-CT in the near future  6. DVT prophylaxis. Patient started on IV heparin   Code Status: Full Code Family Communication: I spoke to his wife who  was present at bedside Disposition Plan: Will place in tele  Time spent: 35min  Troy [name] [facility] Pager [phone]

## 2015-06-16 NOTE — ED Notes (Signed)
Pt requested not to take any more Nitro at this time d/t feelings of throat soreness and coughing. Rates chest tightness 4/10.

## 2015-06-17 ENCOUNTER — Inpatient Hospital Stay (HOSPITAL_COMMUNITY): Payer: Medicare Other

## 2015-06-17 DIAGNOSIS — I200 Unstable angina: Secondary | ICD-10-CM

## 2015-06-17 DIAGNOSIS — R079 Chest pain, unspecified: Secondary | ICD-10-CM | POA: Diagnosis not present

## 2015-06-17 DIAGNOSIS — I481 Persistent atrial fibrillation: Secondary | ICD-10-CM | POA: Diagnosis not present

## 2015-06-17 DIAGNOSIS — I25110 Atherosclerotic heart disease of native coronary artery with unstable angina pectoris: Principal | ICD-10-CM

## 2015-06-17 DIAGNOSIS — I10 Essential (primary) hypertension: Secondary | ICD-10-CM

## 2015-06-17 DIAGNOSIS — I2510 Atherosclerotic heart disease of native coronary artery without angina pectoris: Secondary | ICD-10-CM

## 2015-06-17 DIAGNOSIS — I480 Paroxysmal atrial fibrillation: Secondary | ICD-10-CM | POA: Diagnosis not present

## 2015-06-17 LAB — LIPID PANEL
Cholesterol: 188 mg/dL (ref 0–200)
HDL: 34 mg/dL — ABNORMAL LOW (ref >40)
LDL Cholesterol: 120 mg/dL — ABNORMAL HIGH (ref 0–99)
Total CHOL/HDL Ratio: 5.5 RATIO
Triglycerides: 169 mg/dL — ABNORMAL HIGH (ref <150)
VLDL: 34 mg/dL (ref 0–40)

## 2015-06-17 LAB — BASIC METABOLIC PANEL
Anion gap: 8 (ref 5–15)
BUN: 17 mg/dL (ref 6–20)
CALCIUM: 8.6 mg/dL — AB (ref 8.9–10.3)
CO2: 24 mmol/L (ref 22–32)
CREATININE: 0.94 mg/dL (ref 0.61–1.24)
Chloride: 109 mmol/L (ref 101–111)
GFR calc Af Amer: >60 mL/min (ref >60)
GFR calc non Af Amer: >60 mL/min (ref >60)
GLUCOSE: 100 mg/dL — AB (ref 65–99)
POTASSIUM: 4.2 mmol/L (ref 3.5–5.1)
Sodium: 141 mmol/L (ref 135–145)

## 2015-06-17 LAB — CBC
HCT: 38.7 % — ABNORMAL LOW (ref 39.0–52.0)
Hemoglobin: 12.7 g/dL — ABNORMAL LOW (ref 13.0–17.0)
MCH: 28.9 pg (ref 26.0–34.0)
MCHC: 32.8 g/dL (ref 30.0–36.0)
MCV: 88.2 fL (ref 78.0–100.0)
Platelets: 124 10*3/uL — ABNORMAL LOW (ref 150–400)
RBC: 4.39 MIL/uL (ref 4.22–5.81)
RDW: 13.6 % (ref 11.5–15.5)
WBC: 6.2 10*3/uL (ref 4.0–10.5)

## 2015-06-17 LAB — HEPARIN LEVEL (UNFRACTIONATED)
HEPARIN UNFRACTIONATED: 0.26 [IU]/mL — AB (ref 0.30–0.70)
Heparin Unfractionated: 0.21 IU/mL — ABNORMAL LOW (ref 0.30–0.70)
Heparin Unfractionated: 0.38 IU/mL (ref 0.30–0.70)

## 2015-06-17 MED ORDER — SODIUM CHLORIDE 0.9 % WEIGHT BASED INFUSION
1.0000 mL/kg/h | INTRAVENOUS | Status: DC
  Administered 2015-06-18: 1 mL/kg/h via INTRAVENOUS

## 2015-06-17 MED ORDER — SODIUM CHLORIDE 0.9 % IJ SOLN: 3.0000 mL | INTRAMUSCULAR | Status: DC | PRN

## 2015-06-17 MED ORDER — SODIUM CHLORIDE 0.9 % IJ SOLN
3.0000 mL | Freq: Two times a day (BID) | INTRAMUSCULAR | Status: DC
  Administered 2015-06-18: 3 mL via INTRAVENOUS

## 2015-06-17 MED ORDER — SODIUM CHLORIDE 0.9 % WEIGHT BASED INFUSION
3.0000 mL/kg/h | INTRAVENOUS | Status: DC
  Administered 2015-06-18: 3 mL/kg/h via INTRAVENOUS

## 2015-06-17 MED ORDER — ATORVASTATIN CALCIUM 80 MG PO TABS
80.0000 mg | ORAL_TABLET | Freq: Every day | ORAL | Status: DC
  Administered 2015-06-17 – 2015-06-19 (×3): 80 mg via ORAL
  Filled 2015-06-17 (×2): qty 1
  Filled 2015-06-17: qty 2

## 2015-06-17 MED ORDER — LOSARTAN POTASSIUM 50 MG PO TABS
50.0000 mg | ORAL_TABLET | Freq: Every day | ORAL | Status: DC
  Administered 2015-06-17 – 2015-06-20 (×4): 50 mg via ORAL
  Filled 2015-06-17 (×4): qty 1

## 2015-06-17 MED ORDER — METOPROLOL TARTRATE 25 MG PO TABS
50.0000 mg | ORAL_TABLET | Freq: Two times a day (BID) | ORAL | Status: DC
  Administered 2015-06-17 – 2015-06-20 (×7): 50 mg via ORAL
  Filled 2015-06-17: qty 2
  Filled 2015-06-17: qty 1
  Filled 2015-06-17: qty 2
  Filled 2015-06-17 (×2): qty 1
  Filled 2015-06-17 (×2): qty 2

## 2015-06-17 MED ORDER — METOPROLOL TARTRATE 1 MG/ML IV SOLN
5.0000 mg | Freq: Once | INTRAVENOUS | Status: AC
  Administered 2015-06-17: 5 mg via INTRAVENOUS
  Filled 2015-06-17: qty 5

## 2015-06-17 MED ORDER — ASPIRIN EC 81 MG PO TBEC
81.0000 mg | DELAYED_RELEASE_TABLET | Freq: Every day | ORAL | Status: DC
  Administered 2015-06-17 – 2015-06-20 (×2): 81 mg via ORAL
  Filled 2015-06-17 (×3): qty 1

## 2015-06-17 MED ORDER — SODIUM CHLORIDE 0.9 % IV SOLN: 250.0000 mL | INTRAVENOUS | Status: DC | PRN

## 2015-06-17 MED ORDER — HEPARIN (PORCINE) IN NACL 100-0.45 UNIT/ML-% IJ SOLN
1500.0000 [IU]/h | INTRAMUSCULAR | Status: DC
  Administered 2015-06-17: 1500 [IU]/h via INTRAVENOUS

## 2015-06-17 MED ORDER — HEPARIN (PORCINE) IN NACL 100-0.45 UNIT/ML-% IJ SOLN
1700.0000 [IU]/h | INTRAMUSCULAR | Status: DC
  Administered 2015-06-17: 1700 [IU]/h via INTRAVENOUS
  Filled 2015-06-17: qty 250

## 2015-06-17 MED ORDER — ASPIRIN 81 MG PO CHEW
81.0000 mg | CHEWABLE_TABLET | ORAL | Status: AC
  Administered 2015-06-18: 81 mg via ORAL
  Filled 2015-06-17: qty 1

## 2015-06-17 NOTE — Plan of Care (Signed)
Problem: Consults Goal: Cardiac Cath Patient Education (See Patient Education module for education specifics.) Outcome: Completed/Met Date Met:  06/17/15 Refused video, pt states he's already had cath before, denies wanting to watch video again.

## 2015-06-17 NOTE — Progress Notes (Signed)
  Echocardiogram 2D Echocardiogram has been performed.  Lysle Rubens 06/17/2015, 12:34 PM

## 2015-06-17 NOTE — Consult Note (Addendum)
Admit date: 06/16/2015 Referring Physician  Dr. Coralyn Pear Primary Physician  None Primary Cardiologist  None Reason for Consultation  Chest pain  HPI: Troy [name] is a 64 y.o. male with a past medical history of coronary artery disease s/p percutaneous intervention in 2010 and [place], [place]. Yesterday he presented to the emergency department with complaints of intermittent chest pain and shortness of breath for the past 2-3 months becoming worse in the past 24 hours. He complains of retrosternal chest pain characterized as tight/pressure like, radiating to his right shoulder, precipitated by physical exertion. He also complains of significant dyspnea on exertion and states becoming significantly short of breath with ambulating 10-20 feet. He denies associated nausea or vomiting. In the emergency department he was found to be in atrial fibrillation. Workup included a Chest CT angio which not reveal evidence for pulmonary embolism. He states that current symptoms are similar to symptoms he had back in 2010 and led to his cardiac catheterization.Initial troponin was normal x 2 and Cardiology is now asked to consult.       PMH:   Past Medical History  Diagnosis Date  . Hypertension      PSH:   Past Surgical History  Procedure Laterality Date  . Replacement total knee Left   . Coronary angioplasty with stent placement      Allergies:  Gluten meal; Whey; and Tizanidine Prior to Admit Meds:   Prescriptions prior to admission  Medication Sig Dispense Refill Last Dose  . aspirin 325 MG EC tablet Take 700 mg by mouth once.   06/16/2015 at Unknown time  . atenolol (TENORMIN) 25 MG tablet Take 25 mg by mouth daily.   06/16/2015 at 0400  . losartan-hydrochlorothiazide (HYZAAR) 100-25 MG per tablet Take 1 tablet by mouth daily.   06/16/2015 at 0400  . Omega-3 Fatty Acids (FISH OIL) 500 MG CAPS Take 2,000 mg by mouth daily.    06/16/2015 at Unknown time  . sodium chloride (MURO 128) 2 %  ophthalmic solution Place 1 drop into both eyes every 4 (four) hours as needed for irritation.   Past Week at Unknown time  . atorvastatin (LIPITOR) 10 MG tablet Take 10 mg by mouth daily.       Fam HX:   History reviewed. No pertinent family history. Social HX:    History   Social History  . Marital Status: Married    Spouse Name: N/A  . Number of Children: N/A  . Years of Education: N/A   Occupational History  . Not on file.   Social History Main Topics  . Smoking status: Never Smoker   . Smokeless tobacco: Not on file  . Alcohol Use: Yes     Comment: occ  . Drug Use: No  . Sexual Activity: Not on file   Other Topics Concern  . Not on file   Social History Narrative  . No narrative on file     ROS:  All 11 ROS were addressed and are negative except what is stated in the HPI  Physical Exam: Blood pressure 128/99, pulse 104, temperature 97.9 F (36.6 C), temperature source Oral, resp. rate 16, height 5\' 11"  (1.803 m), weight 241 lb (109.317 kg), SpO2 97 %.    General: Well developed, well nourished, in no acute distress Head: Eyes PERRLA, No xanthomas.   Normal cephalic and atramatic  Lungs:   Clear bilaterally to auscultation and percussion. Heart:   HRRR S1 S2 Pulses are 2+ & equal.  No carotid bruit. No JVD.  No abdominal bruits. No femoral bruits. Abdomen: Bowel sounds are positive, abdomen soft and non-tender without masses Extremities:   No clubbing, cyanosis or edema.  DP +1 Neuro: Alert and oriented X 3. Psych:  Good affect, responds appropriately    Labs:   Lab Results  Component Value Date   WBC 6.2 06/17/2015   HGB 12.7* 06/17/2015   HCT 38.7* 06/17/2015   MCV 88.2 06/17/2015   PLT 124* 06/17/2015    Recent Labs Lab 06/16/15 0850 06/17/15 0605  NA 140 141  K 3.9 4.2  CL 109 109  CO2 25 24  BUN 26* 17  CREATININE 0.96 0.94  CALCIUM 8.7* 8.6*  PROT 7.0  --   BILITOT 0.8  --   ALKPHOS 35*  --   ALT 38  --   AST 29  --     GLUCOSE 109* 100*   No results found for: PTT Lab Results  Component Value Date   INR 1.09 06/16/2015   Lab Results  Component Value Date   TROPONINI <0.03 06/16/2015    No results found for: CHOL No results found for: HDL No results found for: LDLCALC No results found for: TRIG No results found for: CHOLHDL No results found for: LDLDIRECT    Radiology:  Ct Angio Chest Pe W/cm &/or Wo Cm  06/16/2015   CLINICAL DATA:  64 year old male with chest tightness and shortness of breath. Feet swelling.  EXAM: CT ANGIOGRAPHY CHEST WITH CONTRAST  TECHNIQUE: Multidetector CT imaging of the chest was performed using the standard protocol during bolus administration of intravenous contrast. Multiplanar CT image reconstructions and MIPs were obtained to evaluate the vascular anatomy.  CONTRAST:  147mL OMNIPAQUE IOHEXOL 350 MG/ML SOLN  COMPARISON:  No priors.  FINDINGS: Mediastinum/Lymph Nodes: There are no filling defects within the pulmonary arterial tree to suggest underlying pulmonary embolism. Heart size is borderline enlarged. There is no significant pericardial fluid, thickening or pericardial calcification. There is atherosclerosis of the thoracic aorta, the great vessels of the mediastinum and the coronary arteries, including calcified atherosclerotic plaque in the left main, left anterior descending, left circumflex and right coronary arteries. No pathologically enlarged mediastinal or hilar lymph nodes. Esophagus is unremarkable in appearance. No axillary lymphadenopathy.  Lungs/Pleura: In the central aspect of the right lower lobe (image 60 of series 7) there is a 1.5 x 1.2 cm smoothly marginated nodule. No acute consolidative airspace disease. No pleural effusions. Mild dependent subsegmental atelectasis is noted in the lower lobes of the lungs bilaterally.  Upper Abdomen: Unremarkable.  Musculoskeletal/Soft Tissues: There are no aggressive appearing lytic or blastic lesions noted in the visualized  portions of the skeleton.  Review of the MIP images confirms the above findings.  IMPRESSION: 1. No evidence of pulmonary embolism. 2. No acute findings in the thorax to account for the patient's symptoms. 3. 1.5 x 1.2 cm smoothly marginated nodule in the central aspect of the right lower lobe (image 60 of series 7). Correlation with nonemergent PET-CT is recommended in the near future to exclude neoplasm. 4. Atherosclerosis, including left main and 3 vessel coronary artery disease. Please note that although the presence of coronary artery calcium documents the presence of coronary artery disease, the severity of this disease and any potential stenosis cannot be assessed on this non-gated CT examination. Assessment for potential risk factor modification, dietary therapy or pharmacologic therapy may be warranted, if clinically indicated.   Electronically Signed   By: Quillian Quince  Entrikin M.D.   On: 06/16/2015 11:14    EKG:  Atrial fibrillation with CVR and nonspecific ST abnormality  ASSESSMENT/PLAN: 1. Unstable angina - chest pain c/w exertional angina identical to prior angina prompting stent placement.  EKG with nonspecific ST changes.  Chest CT showed 3 vessel CAD including the LM.  Recommend, based on symptoms, that we proceed with left heart cath in the am to redefine coronary anatomy.  Cardiac catheterization was discussed with the patient fully including risks on myocardial infarction, death, stroke, bleeding, arrhythmia, dye allergy, renal insufficiency or bleeding.  All patient questions and concerns were discussed and the patient understands and is willing to proceed.  Will check FLP.  Continue ASA/IV heparin gtt/statin/BB. 2.  New onset atrial fibrillation with CVR of unknown duration.  This patients CHA2DS2-VASc Score and unadjusted Ischemic Stroke Rate (% per year) is equal to 3.2 % stroke rate/year from a score of 3.  Above score calculated as 1 point each if present [CHF, HTN, DM,  Vascular=MI/PAD/Aortic Plaque, Age if 65-74, or Male].  Above score calculated as 2 points each if present [Age > 75, or Stroke/TIA/TE]. Will continue IV Heparin gtt for now and transition to NOAC after cath.  Will need 4 weeks of anticoagulation prior to DCCV. 3.  HTN - controlled on ARB and BB 4. Dyslipdemia - LDL 120 and goal < 70 - increase Lipitor to 80mg  daily 5.  1.5 x 1.2 cm smoothly marginated nodule in the central aspect of the right lower lobe. Radiology recommends correlation with nonemergent PET-CT to exclude neoplasm.  I have recommended that Pulmonary be consulted for further w/u.  This will need to be addressed if he is found to have 3 vessel CAD and needs CABG.  Sueanne Margarita, MD  06/17/2015  8:51 AM

## 2015-06-17 NOTE — Progress Notes (Signed)
[facility] PROGRESS NOTE  Troy [name] IWP:[id number] DOB: 1951/10/10 DOA: 06/16/2015 PCP: No PCP Per Patient  Assessment/Plan: 1. Unstable Angina -Patient with an established history of CAD s/p cardiac catheterization in 2010 with PCI -Presenting with CP having typical features.  -CT chest showed evidence of atherosclerosis of left main and 3 vessels.  -Cardiology consulted -Will continue IV heparin along with metoprolol 50 mg by mouth twice a day and Cozaar 50 mg by mouth daily -Patient will undergo cardiac catheterization in a.m.  2.  Newly diagnosed atrial fibrillation -There is concern that this could be related to ischemia -Plan for patient undergo heart cath in a.m. -Increase his metoprolol from 25-50 g by mouth twice a day given ventricular rates in the low 100's -We'll continue to monitor on telemetry  3.  Lung nodule. -CT scan showed a 1.5 x 1.27 m nodule is radiology recommended follow-up with PET scan. Case discussed with cardiology recommended obtaining a pulmonary consult who recommended repeating a CT scan of lungs without contrast in 2-3 months to evaluate for progression or regression of nodule. Patient does not have a smoking history  4.  Dyslipidemia.  -Continue statin  Code Status: Full Code Family Communication:  Disposition Plan: Plan for heart cath   Consultants:  Cardiology  PCCM   Antibiotics:    HPI/Subjective: Patient is a pleasant 64 year old gentleman with an established history of coronary artery disease status post percutaneous intervention in 2010 admitted to the medicine service on 06/16/2015 when he presented with complaints of chest pain. Patient reported symptoms precipitated by exertion and associate with shortness of breath. CT scan of lungs with IV contrast shows atherosclerosis of left main and 3 coronary arteries. Patient was started on IV heparin with cardiology consultation. Give above findings cardiology recommending Heart  Aath in am.  Objective: Filed Vitals:   06/17/15 0512  BP: 128/99  Pulse: 104  Temp: 97.9 F (36.6 C)  Resp: 16    Intake/Output Summary (Last 24 hours) at 06/17/15 1156 Last data filed at 06/17/15 0800  Gross per 24 hour  Intake 2524.79 ml  Output    200 ml  Net 2324.79 ml   Filed Weights   06/16/15 1333  Weight: 109.317 kg (241 lb)    Exam:   General:  No acute distress, currently CP free.   Cardiovascular: Irregular rate and rhythm, normal S1S2  Respiratory: Normal inspiratory effort, no respiratory distress. Did not auscultate crackles, wheezes, rales.   Abdomen: Soft, NT ND  Musculoskeletal: Having trace pedal edema   Data Reviewed: Basic Metabolic Panel:  Recent Labs Lab 06/16/15 0850 06/17/15 0605  NA 140 141  K 3.9 4.2  CL 109 109  CO2 25 24  GLUCOSE 109* 100*  BUN 26* 17  CREATININE 0.96 0.94  CALCIUM 8.7* 8.6*   Liver Function Tests:  Recent Labs Lab 06/16/15 0850  AST 29  ALT 38  ALKPHOS 35*  BILITOT 0.8  PROT 7.0  ALBUMIN 4.1   No results for input(s): LIPASE, AMYLASE in the last 168 hours. No results for input(s): AMMONIA in the last 168 hours. CBC:  Recent Labs Lab 06/16/15 0850 06/17/15 0708  WBC 6.0 6.2  NEUTROABS 3.3  --   HGB 13.5 12.7*  HCT 40.8 38.7*  MCV 88.1 88.2  PLT 152 124*   Cardiac Enzymes:  Recent Labs Lab 06/16/15 1406 06/16/15 1708 06/16/15 1945  TROPONINI <0.03 <0.03 <0.03   BNP (last 3 results)  Recent Labs  06/16/15 0850  BNP  314.3*    ProBNP (last 3 results) No results for input(s): PROBNP in the last 8760 hours.  CBG: No results for input(s): GLUCAP in the last 168 hours.  No results found for this or any previous visit (from the past 240 hour(s)).   Studies: Ct Angio Chest Pe W/cm &/or Wo Cm  06/16/2015   CLINICAL DATA:  64 year old male with chest tightness and shortness of breath. Feet swelling.  EXAM: CT ANGIOGRAPHY CHEST WITH CONTRAST  TECHNIQUE: Multidetector CT imaging  of the chest was performed using the standard protocol during bolus administration of intravenous contrast. Multiplanar CT image reconstructions and MIPs were obtained to evaluate the vascular anatomy.  CONTRAST:  15mL OMNIPAQUE IOHEXOL 350 MG/ML SOLN  COMPARISON:  No priors.  FINDINGS: Mediastinum/Lymph Nodes: There are no filling defects within the pulmonary arterial tree to suggest underlying pulmonary embolism. Heart size is borderline enlarged. There is no significant pericardial fluid, thickening or pericardial calcification. There is atherosclerosis of the thoracic aorta, the great vessels of the mediastinum and the coronary arteries, including calcified atherosclerotic plaque in the left main, left anterior descending, left circumflex and right coronary arteries. No pathologically enlarged mediastinal or hilar lymph nodes. Esophagus is unremarkable in appearance. No axillary lymphadenopathy.  Lungs/Pleura: In the central aspect of the right lower lobe (image 60 of series 7) there is a 1.5 x 1.2 cm smoothly marginated nodule. No acute consolidative airspace disease. No pleural effusions. Mild dependent subsegmental atelectasis is noted in the lower lobes of the lungs bilaterally.  Upper Abdomen: Unremarkable.  Musculoskeletal/Soft Tissues: There are no aggressive appearing lytic or blastic lesions noted in the visualized portions of the skeleton.  Review of the MIP images confirms the above findings.  IMPRESSION: 1. No evidence of pulmonary embolism. 2. No acute findings in the thorax to account for the patient's symptoms. 3. 1.5 x 1.2 cm smoothly marginated nodule in the central aspect of the right lower lobe (image 60 of series 7). Correlation with nonemergent PET-CT is recommended in the near future to exclude neoplasm. 4. Atherosclerosis, including left main and 3 vessel coronary artery disease. Please note that although the presence of coronary artery calcium documents the presence of coronary artery  disease, the severity of this disease and any potential stenosis cannot be assessed on this non-gated CT examination. Assessment for potential risk factor modification, dietary therapy or pharmacologic therapy may be warranted, if clinically indicated.   Electronically Signed   By: Vinnie Langton M.D.   On: 06/16/2015 11:14    Scheduled Meds: . [START ON 06/18/2015] aspirin  81 mg Oral Pre-Cath  . aspirin EC  81 mg Oral Daily  . atorvastatin  80 mg Oral q1800  . losartan  50 mg Oral Daily  . metoprolol tartrate  50 mg Oral BID  . omega-3 acid ethyl esters  1 g Oral BID  . sodium chloride  3 mL Intravenous Q12H   Continuous Infusions: . sodium chloride 125 mL/hr at 06/17/15 0659  . [START ON 06/18/2015] sodium chloride     Followed by  . [START ON 06/18/2015] sodium chloride    . heparin 1,500 Units/hr (06/17/15 0700)    Active Problems:   A-fib   Chest pain   Essential hypertension   Unstable angina   CAD (coronary artery disease), native coronary artery    Time spent: 35 min    Kelvin Cellar  [facility] Pager [phone]. If 7PM-7AM, please contact night-coverage at [email or web address], password [facility] 06/17/2015, 11:56 AM  LOS:  1 day

## 2015-06-17 NOTE — Consult Note (Signed)
Name: Troy [name] MRN: [id number] DOB: [birth year]    ADMISSION DATE:  06/16/2015 CONSULTATION DATE:  06/17/15  REFERRING MD :  Hospitalist Service  CHIEF COMPLAINT:  Consult for assessment of lung nodule  BRIEF PATIENT DESCRIPTION: Patient with known history of coronary artery disease. Presented with sharp chest pain and dyspnea especially with exertion. Found incidentally to have a right lower lobe lung nodule.  SIGNIFICANT EVENTS  8/6 Admit  STUDIES:  CTA CHEST 8/6 - personally reviewed by me. Smooth nodule in right lower lobe measuring at most 1.5 cm. Adjacent pulmonary vein & airway. No pathologic mediastinal adenopathy. No pulmonary embolism.  HISTORY OF PRESENT ILLNESS:  64 year old male admitted with atrial fibrillation & ongoing dyspnea for approximately 4 days as well as a "sharp stabbing pain" in his anterior chest bilaterally. The patient also reports bilateral pedal edema. He did experience some near syncope with exertion. He reports he is only able to walk 20-30 feet due to his dyspnea. Denies any recent weight changes. Denies any subjective fever, chills, or sweats. He reports he had a cough lasting for 1-2 months but ended approximately one month ago. He denies any history of bronchitis or pneumonia. Patient underwent CT angiogram of the chest on admission which showed no evidence of PE but did have a smoothly marginated right lower lobe nodule.  PAST MEDICAL HISTORY :   has a past medical history of Hypertension.  has past surgical history that includes Replacement total knee (Left) and Coronary angioplasty with stent. Prior to Admission medications   Medication Sig Start Date End Date Taking? Authorizing Provider  aspirin 325 MG EC tablet Take 700 mg by mouth once.   Yes Historical Provider, MD  atenolol (TENORMIN) 25 MG tablet Take 25 mg by mouth daily.   Yes Historical Provider, MD  losartan-hydrochlorothiazide (HYZAAR) 100-25 MG per tablet Take 1 tablet by mouth  daily.   Yes Historical Provider, MD  Omega-3 Fatty Acids (FISH OIL) 500 MG CAPS Take 2,000 mg by mouth daily.    Yes Historical Provider, MD  sodium chloride (MURO 128) 2 % ophthalmic solution Place 1 drop into both eyes every 4 (four) hours as needed for irritation.   Yes Historical Provider, MD  atorvastatin (LIPITOR) 10 MG tablet Take 10 mg by mouth daily.  04/11/15 04/10/16  Historical Provider, MD   Allergies  Allergen Reactions  . Gluten Meal Other (See Comments)    Celiac disease  . Whey     Celiac disease  . Tizanidine Anxiety    Depression    FAMILY HISTORY:  family history is not on file. SOCIAL HISTORY:  reports that he has never smoked. He does not have any smokeless tobacco history on file. He reports that he drinks alcohol. He reports that he does not use illicit drugs.  REVIEW OF SYSTEMS:  No rashes or abnormal bruising. No nausea, vomiting, or diarrhea. A pertinent 14 point review of systems is negative except as per the history of presenting illness.  SUBJECTIVE:   VITAL SIGNS: Temp:  [97.6 F (36.4 C)-98.4 F (36.9 C)] 97.9 F (36.6 C) (08/07 0512) Pulse Rate:  [51-122] 104 (08/07 0512) Resp:  [12-20] 16 (08/07 0512) BP: (119-128)/(81-99) 128/99 mmHg (08/07 0512) SpO2:  [90 %-97 %] 97 % (08/07 0512) Weight:  [241 lb (109.317 kg)] 241 lb (109.317 kg) (08/06 1333)  PHYSICAL EXAMINATION: General:  Awake. Alert. No acute distress. Sitting watching TV. Currently preparing for echocardiogram.  Integument:  Warm & dry. No rash on  exposed skin. No bruising. Lymphatics:  No appreciated cervical or supraclavicular lymphadenoapthy. HEENT:  Moist mucus membranes. No oral ulcers. No scleral injection or icterus. PERRL. Cardiovascular:  Regular rate. No edema. No appreciable JVD.  Pulmonary:  Good aeration & clear to auscultation bilaterally. Symmetric chest wall expansion. No accessory muscle use. Abdomen: Soft. Normal bowel sounds. Nondistended. Grossly  nontender. Musculoskeletal:  Normal bulk and tone. Hand grip strength 5/5 bilaterally. No joint deformity or effusion appreciated. Neurological:  CN 2-12 grossly in tact. No meningismus. Moving all 4 extremities equally.  Psychiatric:  Mood and affect congruent. Speech normal rhythm, rate & tone.    Recent Labs Lab 06/16/15 0850 06/17/15 0605  NA 140 141  K 3.9 4.2  CL 109 109  CO2 25 24  BUN 26* 17  CREATININE 0.96 0.94  GLUCOSE 109* 100*    Recent Labs Lab 06/16/15 0850 06/17/15 0708  HGB 13.5 12.7*  HCT 40.8 38.7*  WBC 6.0 6.2  PLT 152 124*   Ct Angio Chest Pe W/cm &/or Wo Cm  06/16/2015   CLINICAL DATA:  63 year old male with chest tightness and shortness of breath. Feet swelling.  EXAM: CT ANGIOGRAPHY CHEST WITH CONTRAST  TECHNIQUE: Multidetector CT imaging of the chest was performed using the standard protocol during bolus administration of intravenous contrast. Multiplanar CT image reconstructions and MIPs were obtained to evaluate the vascular anatomy.  CONTRAST:  145mL OMNIPAQUE IOHEXOL 350 MG/ML SOLN  COMPARISON:  No priors.  FINDINGS: Mediastinum/Lymph Nodes: There are no filling defects within the pulmonary arterial tree to suggest underlying pulmonary embolism. Heart size is borderline enlarged. There is no significant pericardial fluid, thickening or pericardial calcification. There is atherosclerosis of the thoracic aorta, the great vessels of the mediastinum and the coronary arteries, including calcified atherosclerotic plaque in the left main, left anterior descending, left circumflex and right coronary arteries. No pathologically enlarged mediastinal or hilar lymph nodes. Esophagus is unremarkable in appearance. No axillary lymphadenopathy.  Lungs/Pleura: In the central aspect of the right lower lobe (image 60 of series 7) there is a 1.5 x 1.2 cm smoothly marginated nodule. No acute consolidative airspace disease. No pleural effusions. Mild dependent subsegmental  atelectasis is noted in the lower lobes of the lungs bilaterally.  Upper Abdomen: Unremarkable.  Musculoskeletal/Soft Tissues: There are no aggressive appearing lytic or blastic lesions noted in the visualized portions of the skeleton.  Review of the MIP images confirms the above findings.  IMPRESSION: 1. No evidence of pulmonary embolism. 2. No acute findings in the thorax to account for the patient's symptoms. 3. 1.5 x 1.2 cm smoothly marginated nodule in the central aspect of the right lower lobe (image 60 of series 7). Correlation with nonemergent PET-CT is recommended in the near future to exclude neoplasm. 4. Atherosclerosis, including left main and 3 vessel coronary artery disease. Please note that although the presence of coronary artery calcium documents the presence of coronary artery disease, the severity of this disease and any potential stenosis cannot be assessed on this non-gated CT examination. Assessment for potential risk factor modification, dietary therapy or pharmacologic therapy may be warranted, if clinically indicated.   Electronically Signed   By: Vinnie Langton M.D.   On: 06/16/2015 11:14    ASSESSMENT / PLAN: 1. Right lower lobe nodule: Recommend repeat CT scan of the chest without contrast in 2-3 months to evaluate for progression or regression of nodule given his relatively recent cough/upper respiratory illness as this could be simply residual inflammation. Patient denies  having ever had a CT scan of the chest before. Discussed PET/CT imaging with the patient and if he wishes to be aggressive this could be performed as an outpatient as the nodule is large enough. Biopsy may be technically difficult given proximity to pulmonary vein. 2. Atrial fibrillation: Per primary service and cardiology. 3. Follow-up: Patient should have follow-up evaluation either with his primary care provider or our office upon discharge to address his wishes with regards to PET/CT versus repeat CT scan  of the chest without contrast in 2-3 months.  Sonia Baller Ashok Cordia, M.D. [facility] Pager:  [phone] After 3pm or if no response, call [phone] 06/17/2015, 12:01 PM

## 2015-06-17 NOTE — Progress Notes (Signed)
ANTICOAGULATION CONSULT NOTE - [place] for heparin infusion  Indication: chest pain/ACS, new onset atrial fibrillation  Allergies  Allergen Reactions  . Gluten Meal Other (See Comments)    Celiac disease  . Whey     Celiac disease  . Tizanidine Anxiety    Depression    Patient Measurements: Height: 5\' 11"  (180.3 cm) Weight: 241 lb (109.317 kg) IBW/kg (Calculated) : 75.3 Heparin Dosing Weight: 98.7 kg   Vital Signs: Temp: 97.4 F (36.3 C) (08/07 1500) Temp Source: Oral (08/07 1500) BP: 126/88 mmHg (08/07 1500) Pulse Rate: 105 (08/07 1500)  Labs:  Recent Labs  06/16/15 0850 06/16/15 1406 06/16/15 1418 06/16/15 1708 06/16/15 1945 06/16/15 2116 06/17/15 0605 06/17/15 0708 06/17/15 1418  HGB 13.5  --   --   --   --   --   --  12.7*  --   HCT 40.8  --   --   --   --   --   --  38.7*  --   PLT 152  --   --   --   --   --   --  124*  --   APTT  --   --  31  --   --   --   --   --   --   LABPROT  --   --  14.3  --   --   --   --   --   --   INR  --   --  1.09  --   --   --   --   --   --   HEPARINUNFRC  --   --   --   --   --  0.22* 0.21*  --  0.26*  CREATININE 0.96  --   --   --   --   --  0.94  --   --   TROPONINI  --  <0.03  --  <0.03 <0.03  --   --   --   --     Estimated Creatinine Clearance: 98.5 mL/min (by C-G formula based on Cr of 0.94).   Medical History: Past Medical History  Diagnosis Date  . Hypertension     Assessment: 45 y/oM with a PMH of CAD s/p PCI in 2010 in [place], [place]. Pt complains of chest discomfort that has become worse in last 24 hours. Concern for unstable angina. Pt also has been newly diagnosed with atrial fibrillation. No PTA anti-coagulants. Pt on aspirin as outpatient. Baseline CBC, aPTT, PT/INR WNL. Pharmacy consulted to dose heparin infusion for this patient.  Today, 06/17/2015:  0605 HL = 0.21, subtherapeutic, heparin infusion increased from 1350 units/hr to 1500 units/hr  1418 HL =  0.26, subtherapeutic, will increase heparin infusion per plan below  Hgb slightly decreased to 12.7, Pltc decreased to 124K  No bleeding issues or line complications reported per nursing   Plan for cardiac catheterization tomorrow per Cardiology.    Goal of Therapy:  Heparin level 0.3-0.7 units/ml Monitor platelets by anticoagulation protocol: Yes   Plan:   Increase heparin infusion to 1700 units/hr.  Check heparin level 6 hours after rate change.  Daily CBC and heparin level while on heparin infusion.  Monitor closely for s/s of bleeding.   Lindell Spar, PharmD, BCPS Pager: [phone] 06/17/2015 4:19 PM

## 2015-06-18 ENCOUNTER — Encounter (HOSPITAL_COMMUNITY)
Admission: EM | Disposition: A | Discharge status: Home / Self Care | Payer: Self-pay | Source: Home / Self Care | Attending: Internal Medicine

## 2015-06-18 ENCOUNTER — Encounter (HOSPITAL_COMMUNITY): Payer: Self-pay | Admitting: Cardiovascular Disease

## 2015-06-18 DIAGNOSIS — Z955 Presence of coronary angioplasty implant and graft: Secondary | ICD-10-CM | POA: Diagnosis not present

## 2015-06-18 DIAGNOSIS — I10 Essential (primary) hypertension: Secondary | ICD-10-CM | POA: Diagnosis not present

## 2015-06-18 DIAGNOSIS — I97710 Intraoperative cardiac arrest during cardiac surgery: Secondary | ICD-10-CM | POA: Diagnosis not present

## 2015-06-18 DIAGNOSIS — R079 Chest pain, unspecified: Secondary | ICD-10-CM | POA: Diagnosis not present

## 2015-06-18 DIAGNOSIS — Z79899 Other long term (current) drug therapy: Secondary | ICD-10-CM | POA: Diagnosis not present

## 2015-06-18 DIAGNOSIS — Z96652 Presence of left artificial knee joint: Secondary | ICD-10-CM | POA: Diagnosis present

## 2015-06-18 DIAGNOSIS — E785 Hyperlipidemia, unspecified: Secondary | ICD-10-CM | POA: Diagnosis present

## 2015-06-18 DIAGNOSIS — I25110 Atherosclerotic heart disease of native coronary artery with unstable angina pectoris: Secondary | ICD-10-CM | POA: Diagnosis not present

## 2015-06-18 DIAGNOSIS — I200 Unstable angina: Secondary | ICD-10-CM | POA: Diagnosis not present

## 2015-06-18 DIAGNOSIS — I4901 Ventricular fibrillation: Secondary | ICD-10-CM | POA: Diagnosis not present

## 2015-06-18 DIAGNOSIS — R911 Solitary pulmonary nodule: Secondary | ICD-10-CM | POA: Diagnosis present

## 2015-06-18 DIAGNOSIS — I2510 Atherosclerotic heart disease of native coronary artery without angina pectoris: Secondary | ICD-10-CM | POA: Diagnosis not present

## 2015-06-18 DIAGNOSIS — I480 Paroxysmal atrial fibrillation: Secondary | ICD-10-CM | POA: Diagnosis not present

## 2015-06-18 DIAGNOSIS — M7989 Other specified soft tissue disorders: Secondary | ICD-10-CM | POA: Diagnosis present

## 2015-06-18 DIAGNOSIS — Z7982 Long term (current) use of aspirin: Secondary | ICD-10-CM | POA: Diagnosis not present

## 2015-06-18 DIAGNOSIS — Z7902 Long term (current) use of antithrombotics/antiplatelets: Secondary | ICD-10-CM | POA: Diagnosis not present

## 2015-06-18 HISTORY — PX: CARDIAC CATHETERIZATION: SHX172

## 2015-06-18 LAB — CBC
HCT: 37.1 % — ABNORMAL LOW (ref 39.0–52.0)
HEMOGLOBIN: 12.5 g/dL — AB (ref 13.0–17.0)
MCH: 29.8 pg (ref 26.0–34.0)
MCHC: 33.7 g/dL (ref 30.0–36.0)
MCV: 88.5 fL (ref 78.0–100.0)
Platelets: 123 10*3/uL — ABNORMAL LOW (ref 150–400)
RBC: 4.19 MIL/uL — ABNORMAL LOW (ref 4.22–5.81)
RDW: 13.7 % (ref 11.5–15.5)
WBC: 7.7 10*3/uL (ref 4.0–10.5)

## 2015-06-18 LAB — BASIC METABOLIC PANEL
ANION GAP: 4 — AB (ref 5–15)
BUN: 15 mg/dL (ref 6–20)
CO2: 26 mmol/L (ref 22–32)
CREATININE: 0.95 mg/dL (ref 0.61–1.24)
Calcium: 8.4 mg/dL — ABNORMAL LOW (ref 8.9–10.3)
Chloride: 110 mmol/L (ref 101–111)
GFR calc Af Amer: >60 mL/min (ref >60)
GFR calc non Af Amer: >60 mL/min (ref >60)
GLUCOSE: 104 mg/dL — AB (ref 65–99)
Potassium: 4.6 mmol/L (ref 3.5–5.1)
Sodium: 140 mmol/L (ref 135–145)

## 2015-06-18 LAB — HEPARIN LEVEL (UNFRACTIONATED): HEPARIN UNFRACTIONATED: 0.4 [IU]/mL (ref 0.30–0.70)

## 2015-06-18 SURGERY — LEFT HEART CATH AND CORONARY ANGIOGRAPHY

## 2015-06-18 MED ORDER — MIDAZOLAM HCL 2 MG/2ML IJ SOLN
INTRAMUSCULAR | Status: DC | PRN
  Administered 2015-06-18: 2 mg via INTRAVENOUS

## 2015-06-18 MED ORDER — SODIUM CHLORIDE 0.9 % IV SOLN: 250.0000 mL | INTRAVENOUS | Status: DC | PRN

## 2015-06-18 MED ORDER — SODIUM CHLORIDE 0.9 % IJ SOLN: 3.0000 mL | INTRAMUSCULAR | Status: DC | PRN

## 2015-06-18 MED ORDER — HEPARIN SODIUM (PORCINE) 1000 UNIT/ML IJ SOLN
INTRAMUSCULAR | Status: AC
  Filled 2015-06-18: qty 1

## 2015-06-18 MED ORDER — LIDOCAINE HCL (PF) 1 % IJ SOLN
INTRAMUSCULAR | Status: AC
  Filled 2015-06-18: qty 30

## 2015-06-18 MED ORDER — SODIUM CHLORIDE 0.9 % IJ SOLN: 3.0000 mL | Freq: Two times a day (BID) | INTRAMUSCULAR | Status: DC

## 2015-06-18 MED ORDER — IOHEXOL 350 MG/ML SOLN
INTRAVENOUS | Status: DC | PRN
  Administered 2015-06-18: 120 mL via INTRAVENOUS

## 2015-06-18 MED ORDER — HEPARIN SODIUM (PORCINE) 1000 UNIT/ML IJ SOLN
INTRAMUSCULAR | Status: DC | PRN
  Administered 2015-06-18: 5000 [IU] via INTRAVENOUS

## 2015-06-18 MED ORDER — SODIUM CHLORIDE 0.9 % IV SOLN
INTRAVENOUS | Status: AC
Start: 2015-06-18 — End: 2015-06-18
  Administered 2015-06-18: 14:00:00 via INTRAVENOUS

## 2015-06-18 MED ORDER — ANGIOPLASTY BOOK
Freq: Once | Status: AC
  Administered 2015-06-18: 18:00:00
  Filled 2015-06-18: qty 1

## 2015-06-18 MED ORDER — HEPARIN (PORCINE) IN NACL 2-0.9 UNIT/ML-% IJ SOLN
INTRAMUSCULAR | Status: AC
  Filled 2015-06-18: qty 1000

## 2015-06-18 MED ORDER — VERAPAMIL HCL 2.5 MG/ML IV SOLN
INTRAVENOUS | Status: AC
  Filled 2015-06-18: qty 2

## 2015-06-18 MED ORDER — ANGIOPLASTY BOOK
Freq: Once | Status: AC
  Filled 2015-06-18: qty 1

## 2015-06-18 MED ORDER — LORAZEPAM 2 MG/ML IJ SOLN
1.0000 mg | Freq: Once | INTRAMUSCULAR | Status: AC
  Administered 2015-06-18: 1 mg via INTRAVENOUS
  Filled 2015-06-18: qty 1

## 2015-06-18 MED ORDER — METOPROLOL TARTRATE 1 MG/ML IV SOLN
5.0000 mg | Freq: Once | INTRAVENOUS | Status: AC
  Administered 2015-06-18: 5 mg via INTRAVENOUS
  Filled 2015-06-18: qty 5

## 2015-06-18 MED ORDER — CLOPIDOGREL BISULFATE 75 MG PO TABS
600.0000 mg | ORAL_TABLET | Freq: Once | ORAL | Status: AC
  Administered 2015-06-18: 600 mg via ORAL
  Filled 2015-06-18: qty 8

## 2015-06-18 MED ORDER — CLOPIDOGREL BISULFATE 75 MG PO TABS: 75.0000 mg | ORAL_TABLET | Freq: Every day | ORAL | Status: DC

## 2015-06-18 MED ORDER — LORAZEPAM 0.5 MG PO TABS
0.5000 mg | ORAL_TABLET | Freq: Once | ORAL | Status: AC
  Administered 2015-06-18: 0.5 mg via ORAL
  Filled 2015-06-18: qty 1

## 2015-06-18 MED ORDER — NITROGLYCERIN 1 MG/10 ML FOR IR/CATH LAB
INTRA_ARTERIAL | Status: AC
  Filled 2015-06-18: qty 10

## 2015-06-18 MED ORDER — OFF THE BEAT BOOK
Freq: Once | Status: AC
  Administered 2015-06-18: 23:00:00
  Filled 2015-06-18: qty 1

## 2015-06-18 MED ORDER — HEPARIN (PORCINE) IN NACL 100-0.45 UNIT/ML-% IJ SOLN
1950.0000 [IU]/h | INTRAMUSCULAR | Status: DC
  Administered 2015-06-18: 1700 [IU]/h via INTRAVENOUS
  Filled 2015-06-18: qty 250

## 2015-06-18 MED ORDER — ASPIRIN 81 MG PO CHEW: 81.0000 mg | CHEWABLE_TABLET | ORAL | Status: DC

## 2015-06-18 MED ORDER — MIDAZOLAM HCL 2 MG/2ML IJ SOLN
INTRAMUSCULAR | Status: AC
  Filled 2015-06-18: qty 4

## 2015-06-18 MED ORDER — ASPIRIN 81 MG PO CHEW: 81.0000 mg | CHEWABLE_TABLET | Freq: Every day | ORAL | Status: DC

## 2015-06-18 MED ORDER — ALPRAZOLAM 0.25 MG PO TABS
0.2500 mg | ORAL_TABLET | ORAL | Status: AC
  Administered 2015-06-18: 0.25 mg via ORAL
  Filled 2015-06-18: qty 1

## 2015-06-18 MED ORDER — VERAPAMIL HCL 2.5 MG/ML IV SOLN
INTRAVENOUS | Status: DC | PRN
  Administered 2015-06-18: 13:00:00 via INTRA_ARTERIAL

## 2015-06-18 MED ORDER — NITROGLYCERIN 1 MG/10 ML FOR IR/CATH LAB
INTRA_ARTERIAL | Status: DC | PRN
  Administered 2015-06-18: 200 ug via INTRACORONARY

## 2015-06-18 MED ORDER — FENTANYL CITRATE (PF) 100 MCG/2ML IJ SOLN
INTRAMUSCULAR | Status: DC | PRN
  Administered 2015-06-18: 50 ug via INTRAVENOUS

## 2015-06-18 MED ORDER — SODIUM CHLORIDE 0.9 % WEIGHT BASED INFUSION: 1.0000 mL/kg/h | INTRAVENOUS | Status: DC

## 2015-06-18 MED ORDER — SODIUM CHLORIDE 0.9 % WEIGHT BASED INFUSION: 3.0000 mL/kg/h | INTRAVENOUS | Status: DC

## 2015-06-18 MED ORDER — FENTANYL CITRATE (PF) 100 MCG/2ML IJ SOLN
INTRAMUSCULAR | Status: AC
  Filled 2015-06-18: qty 4

## 2015-06-18 SURGICAL SUPPLY — 14 items

## 2015-06-18 NOTE — Progress Notes (Signed)
ANTICOAGULATION CONSULT NOTE - Follow Up Consult  Pharmacy Consult for Heparin Indication: chest pain/ACS, new onset atrial fibrillation  Allergies  Allergen Reactions  . Gluten Meal Other (See Comments)    Celiac disease  . Whey     Celiac disease  . Tizanidine Anxiety    Depression    Patient Measurements: Height: 5\' 11"  (180.3 cm) Weight: 241 lb (109.317 kg) IBW/kg (Calculated) : 75.3 Heparin Dosing Weight:   Vital Signs: Temp: 98 F (36.7 C) (08/08 0546) Temp Source: Oral (08/08 0546) BP: 139/89 mmHg (08/08 0546) Pulse Rate: 123 (08/08 0546)  Labs:  Recent Labs  06/16/15 0850 06/16/15 1406 06/16/15 1418 06/16/15 1708 06/16/15 1945  06/17/15 0605 06/17/15 0708 06/17/15 1418 06/17/15 2252 06/18/15 0446  HGB 13.5  --   --   --   --   --   --  12.7*  --   --  12.5*  HCT 40.8  --   --   --   --   --   --  38.7*  --   --  37.1*  PLT 152  --   --   --   --   --   --  124*  --   --  123*  APTT  --   --  31  --   --   --   --   --   --   --   --   LABPROT  --   --  14.3  --   --   --   --   --   --   --   --   INR  --   --  1.09  --   --   --   --   --   --   --   --   HEPARINUNFRC  --   --   --   --   --   < > 0.21*  --  0.26* 0.38 0.40  CREATININE 0.96  --   --   --   --   --  0.94  --   --   --  0.95  TROPONINI  --  <0.03  --  <0.03 <0.03  --   --   --   --   --   --   < > = values in this interval not displayed.  Estimated Creatinine Clearance: 97.5 mL/min (by C-G formula based on Cr of 0.95).   Medications:  Infusions:  . sodium chloride 125 mL/hr at 06/18/15 0541  . sodium chloride 3 mL/kg/hr (06/18/15 0604)   Followed by  . sodium chloride    . heparin 1,700 Units/hr (06/17/15 2131)    Assessment: Patient with 2nd heparin level at goal.  No heparin issues noted.  Goal of Therapy:  Heparin level 0.3-0.7 units/ml Monitor platelets by anticoagulation protocol: Yes   Plan:  Continue heparin drip at current rate Recheck level with 8/9 AM  labs  Tyler Deis, Shea Stakes [place] 06/18/2015,6:25 AM

## 2015-06-18 NOTE — Interval H&P Note (Signed)
History and Physical Interval Note:  06/18/2015 12:21 PM  Troy [name]  has presented today for cardiac cath with the diagnosis of unstable angina. The various methods of treatment have been discussed with the patient and family. After consideration of risks, benefits and other options for treatment, the patient has consented to  Procedure(s): Left Heart Cath and Coronary Angiography (N/A) as a surgical intervention .  The patient's history has been reviewed, patient examined, no change in status, stable for surgery.  I have reviewed the patient's chart and labs.  Questions were answered to the patient's satisfaction.    Cath Lab Visit (complete for each Cath Lab visit)  Clinical Evaluation Leading to the Procedure:   ACS: No.  Non-ACS:    Anginal Classification: CCS III  Anti-ischemic medical therapy: Minimal Therapy (1 class of medications)  Non-Invasive Test Results: No non-invasive testing performed  Prior CABG: No previous CABG         Troy [name]

## 2015-06-18 NOTE — Progress Notes (Signed)
TR BAND REMOVAL  LOCATION:  right radial  DEFLATED PER PROTOCOL:  Yes.    TIME BAND OFF / DRESSING APPLIED:   1700   SITE UPON ARRIVAL:   Level 0  SITE AFTER BAND REMOVAL:  Level 0  REVERSE ALLEN'S TEST:    positive  CIRCULATION SENSATION AND MOVEMENT:  Within Normal Limits  Yes.    COMMENTS:

## 2015-06-18 NOTE — Progress Notes (Signed)
ANTICOAGULATION CONSULT NOTE - Follow Up Consult  Pharmacy Consult for Heparin Indication: chest pain/ACS, new onset atrial fibrillation  Allergies  Allergen Reactions  . Gluten Meal Other (See Comments)    Celiac disease  . Whey     Celiac disease  . Tizanidine Anxiety    Depression    Patient Measurements: Height: 5\' 11"  (180.3 cm) Weight: 241 lb (109.317 kg) IBW/kg (Calculated) : 75.3 Heparin Dosing Weight: 98 kg  Vital Signs: Temp: 98.9 F (37.2 C) (08/08 1339) Temp Source: Oral (08/08 1339) BP: 127/84 mmHg (08/08 1339) Pulse Rate: 84 (08/08 1339)  Labs:  Recent Labs  06/16/15 0850 06/16/15 1406 06/16/15 1418 06/16/15 1708 06/16/15 1945  06/17/15 0605 06/17/15 0708 06/17/15 1418 06/17/15 2252 06/18/15 0446  HGB 13.5  --   --   --   --   --   --  12.7*  --   --  12.5*  HCT 40.8  --   --   --   --   --   --  38.7*  --   --  37.1*  PLT 152  --   --   --   --   --   --  124*  --   --  123*  APTT  --   --  31  --   --   --   --   --   --   --   --   LABPROT  --   --  14.3  --   --   --   --   --   --   --   --   INR  --   --  1.09  --   --   --   --   --   --   --   --   HEPARINUNFRC  --   --   --   --   --   < > 0.21*  --  0.26* 0.38 0.40  CREATININE 0.96  --   --   --   --   --  0.94  --   --   --  0.95  TROPONINI  --  <0.03  --  <0.03 <0.03  --   --   --   --   --   --   < > = values in this interval not displayed.  Estimated Creatinine Clearance: 97.5 mL/min (by C-G formula based on Cr of 0.95).   Medications:  Infusions:  . sodium chloride      Assessment: Pt is 64 y.o. male with a PMH of coronary artery disease who is s/p cath 8/8.  Pt needs PCI (planned for 8/9) of RCA and LAD.  To restart heparin 6 hours after TR band removed.  Pt was previously therapeutic on heparin at 1700 units/hr.  Goal of Therapy:  Heparin level 0.3-0.7 units/ml Monitor platelets by anticoagulation protocol: Yes   Plan:  At 2130, restart heparin at 1700  units/hr. Heparin level in 8 hours (8/9 at 0530) Heparin level and CBC daily while on heparin.  [employer], Pharm.D., BCPS Clinical Pharmacist Pager [phone] 06/18/2015 2:07 PM

## 2015-06-18 NOTE — H&P (View-Only) (Signed)
Admit date: 06/16/2015 Referring Physician  Dr. Coralyn Pear Primary Physician  None Primary Cardiologist  None Reason for Consultation  Chest pain  HPI: Troy [name] is a 64 y.o. male with a past medical history of coronary artery disease s/p percutaneous intervention in 2010 and [place], [place]. Yesterday he presented to the emergency department with complaints of intermittent chest pain and shortness of breath for the past 2-3 months becoming worse in the past 24 hours. He complains of retrosternal chest pain characterized as tight/pressure like, radiating to his right shoulder, precipitated by physical exertion. He also complains of significant dyspnea on exertion and states becoming significantly short of breath with ambulating 10-20 feet. He denies associated nausea or vomiting. In the emergency department he was found to be in atrial fibrillation. Workup included a Chest CT angio which not reveal evidence for pulmonary embolism. He states that current symptoms are similar to symptoms he had back in 2010 and led to his cardiac catheterization.Initial troponin was normal x 2 and Cardiology is now asked to consult.       PMH:   Past Medical History  Diagnosis Date  . Hypertension      PSH:   Past Surgical History  Procedure Laterality Date  . Replacement total knee Left   . Coronary angioplasty with stent placement      Allergies:  Gluten meal; Whey; and Tizanidine Prior to Admit Meds:   Prescriptions prior to admission  Medication Sig Dispense Refill Last Dose  . aspirin 325 MG EC tablet Take 700 mg by mouth once.   06/16/2015 at Unknown time  . atenolol (TENORMIN) 25 MG tablet Take 25 mg by mouth daily.   06/16/2015 at 0400  . losartan-hydrochlorothiazide (HYZAAR) 100-25 MG per tablet Take 1 tablet by mouth daily.   06/16/2015 at 0400  . Omega-3 Fatty Acids (FISH OIL) 500 MG CAPS Take 2,000 mg by mouth daily.    06/16/2015 at Unknown time  . sodium chloride (MURO 128) 2 %  ophthalmic solution Place 1 drop into both eyes every 4 (four) hours as needed for irritation.   Past Week at Unknown time  . atorvastatin (LIPITOR) 10 MG tablet Take 10 mg by mouth daily.       Fam HX:   History reviewed. No pertinent family history. Social HX:    History   Social History  . Marital Status: Married    Spouse Name: N/A  . Number of Children: N/A  . Years of Education: N/A   Occupational History  . Not on file.   Social History Main Topics  . Smoking status: Never Smoker   . Smokeless tobacco: Not on file  . Alcohol Use: Yes     Comment: occ  . Drug Use: No  . Sexual Activity: Not on file   Other Topics Concern  . Not on file   Social History Narrative  . No narrative on file     ROS:  All 11 ROS were addressed and are negative except what is stated in the HPI  Physical Exam: Blood pressure 128/99, pulse 104, temperature 97.9 F (36.6 C), temperature source Oral, resp. rate 16, height 5\' 11"  (1.803 m), weight 241 lb (109.317 kg), SpO2 97 %.    General: Well developed, well nourished, in no acute distress Head: Eyes PERRLA, No xanthomas.   Normal cephalic and atramatic  Lungs:   Clear bilaterally to auscultation and percussion. Heart:   HRRR S1 S2 Pulses are 2+ & equal.  No carotid bruit. No JVD.  No abdominal bruits. No femoral bruits. Abdomen: Bowel sounds are positive, abdomen soft and non-tender without masses Extremities:   No clubbing, cyanosis or edema.  DP +1 Neuro: Alert and oriented X 3. Psych:  Good affect, responds appropriately    Labs:   Lab Results  Component Value Date   WBC 6.2 06/17/2015   HGB 12.7* 06/17/2015   HCT 38.7* 06/17/2015   MCV 88.2 06/17/2015   PLT 124* 06/17/2015    Recent Labs Lab 06/16/15 0850 06/17/15 0605  NA 140 141  K 3.9 4.2  CL 109 109  CO2 25 24  BUN 26* 17  CREATININE 0.96 0.94  CALCIUM 8.7* 8.6*  PROT 7.0  --   BILITOT 0.8  --   ALKPHOS 35*  --   ALT 38  --   AST 29  --     GLUCOSE 109* 100*   No results found for: PTT Lab Results  Component Value Date   INR 1.09 06/16/2015   Lab Results  Component Value Date   TROPONINI <0.03 06/16/2015    No results found for: CHOL No results found for: HDL No results found for: LDLCALC No results found for: TRIG No results found for: CHOLHDL No results found for: LDLDIRECT    Radiology:  Ct Angio Chest Pe W/cm &/or Wo Cm  06/16/2015   CLINICAL DATA:  64 year old male with chest tightness and shortness of breath. Feet swelling.  EXAM: CT ANGIOGRAPHY CHEST WITH CONTRAST  TECHNIQUE: Multidetector CT imaging of the chest was performed using the standard protocol during bolus administration of intravenous contrast. Multiplanar CT image reconstructions and MIPs were obtained to evaluate the vascular anatomy.  CONTRAST:  162mL OMNIPAQUE IOHEXOL 350 MG/ML SOLN  COMPARISON:  No priors.  FINDINGS: Mediastinum/Lymph Nodes: There are no filling defects within the pulmonary arterial tree to suggest underlying pulmonary embolism. Heart size is borderline enlarged. There is no significant pericardial fluid, thickening or pericardial calcification. There is atherosclerosis of the thoracic aorta, the great vessels of the mediastinum and the coronary arteries, including calcified atherosclerotic plaque in the left main, left anterior descending, left circumflex and right coronary arteries. No pathologically enlarged mediastinal or hilar lymph nodes. Esophagus is unremarkable in appearance. No axillary lymphadenopathy.  Lungs/Pleura: In the central aspect of the right lower lobe (image 60 of series 7) there is a 1.5 x 1.2 cm smoothly marginated nodule. No acute consolidative airspace disease. No pleural effusions. Mild dependent subsegmental atelectasis is noted in the lower lobes of the lungs bilaterally.  Upper Abdomen: Unremarkable.  Musculoskeletal/Soft Tissues: There are no aggressive appearing lytic or blastic lesions noted in the visualized  portions of the skeleton.  Review of the MIP images confirms the above findings.  IMPRESSION: 1. No evidence of pulmonary embolism. 2. No acute findings in the thorax to account for the patient's symptoms. 3. 1.5 x 1.2 cm smoothly marginated nodule in the central aspect of the right lower lobe (image 60 of series 7). Correlation with nonemergent PET-CT is recommended in the near future to exclude neoplasm. 4. Atherosclerosis, including left main and 3 vessel coronary artery disease. Please note that although the presence of coronary artery calcium documents the presence of coronary artery disease, the severity of this disease and any potential stenosis cannot be assessed on this non-gated CT examination. Assessment for potential risk factor modification, dietary therapy or pharmacologic therapy may be warranted, if clinically indicated.   Electronically Signed   By: Quillian Quince  Entrikin M.D.   On: 06/16/2015 11:14    EKG:  Atrial fibrillation with CVR and nonspecific ST abnormality  ASSESSMENT/PLAN: 1. Unstable angina - chest pain c/w exertional angina identical to prior angina prompting stent placement.  EKG with nonspecific ST changes.  Chest CT showed 3 vessel CAD including the LM.  Recommend, based on symptoms, that we proceed with left heart cath in the am to redefine coronary anatomy.  Cardiac catheterization was discussed with the patient fully including risks on myocardial infarction, death, stroke, bleeding, arrhythmia, dye allergy, renal insufficiency or bleeding.  All patient questions and concerns were discussed and the patient understands and is willing to proceed.  Will check FLP.  Continue ASA/IV heparin gtt/statin/BB. 2.  New onset atrial fibrillation with CVR of unknown duration.  This patients CHA2DS2-VASc Score and unadjusted Ischemic Stroke Rate (% per year) is equal to 3.2 % stroke rate/year from a score of 3.  Above score calculated as 1 point each if present [CHF, HTN, DM,  Vascular=MI/PAD/Aortic Plaque, Age if 65-74, or Male].  Above score calculated as 2 points each if present [Age > 75, or Stroke/TIA/TE]. Will continue IV Heparin gtt for now and transition to NOAC after cath.  Will need 4 weeks of anticoagulation prior to DCCV. 3.  HTN - controlled on ARB and BB 4. Dyslipdemia - LDL 120 and goal < 70 - increase Lipitor to 80mg  daily 5.  1.5 x 1.2 cm smoothly marginated nodule in the central aspect of the right lower lobe. Radiology recommends correlation with nonemergent PET-CT to exclude neoplasm.  I have recommended that Pulmonary be consulted for further w/u.  This will need to be addressed if he is found to have 3 vessel CAD and needs CABG.  Sueanne Margarita, MD  06/17/2015  8:51 AM

## 2015-06-19 ENCOUNTER — Encounter (HOSPITAL_COMMUNITY): Payer: Self-pay | Admitting: Cardiology

## 2015-06-19 ENCOUNTER — Encounter (HOSPITAL_COMMUNITY)
Admission: EM | Disposition: A | Discharge status: Home / Self Care | Payer: Self-pay | Source: Home / Self Care | Attending: Internal Medicine

## 2015-06-19 HISTORY — PX: CARDIAC CATHETERIZATION: SHX172

## 2015-06-19 LAB — BASIC METABOLIC PANEL
Anion gap: 6 (ref 5–15)
BUN: 11 mg/dL (ref 6–20)
CALCIUM: 8.7 mg/dL — AB (ref 8.9–10.3)
CHLORIDE: 107 mmol/L (ref 101–111)
CO2: 26 mmol/L (ref 22–32)
Creatinine, Ser: 1.01 mg/dL (ref 0.61–1.24)
GFR calc Af Amer: >60 mL/min (ref >60)
Glucose, Bld: 106 mg/dL — ABNORMAL HIGH (ref 65–99)
POTASSIUM: 3.9 mmol/L (ref 3.5–5.1)
Sodium: 139 mmol/L (ref 135–145)

## 2015-06-19 LAB — CBC
HCT: 36 % — ABNORMAL LOW (ref 39.0–52.0)
HEMOGLOBIN: 12 g/dL — AB (ref 13.0–17.0)
MCH: 29.1 pg (ref 26.0–34.0)
MCHC: 33.3 g/dL (ref 30.0–36.0)
MCV: 87.4 fL (ref 78.0–100.0)
Platelets: 120 10*3/uL — ABNORMAL LOW (ref 150–400)
RBC: 4.12 MIL/uL — ABNORMAL LOW (ref 4.22–5.81)
RDW: 14 % (ref 11.5–15.5)
WBC: 6.9 10*3/uL (ref 4.0–10.5)

## 2015-06-19 LAB — HEPARIN LEVEL (UNFRACTIONATED): HEPARIN UNFRACTIONATED: 0.19 [IU]/mL — AB (ref 0.30–0.70)

## 2015-06-19 SURGERY — CORONARY STENT INTERVENTION

## 2015-06-19 MED ORDER — CLOPIDOGREL BISULFATE 75 MG PO TABS
ORAL_TABLET | ORAL | Status: DC | PRN
  Administered 2015-06-19: 75 mg via ORAL

## 2015-06-19 MED ORDER — CLOPIDOGREL BISULFATE 75 MG PO TABS
75.0000 mg | ORAL_TABLET | Freq: Every day | ORAL | Status: DC
  Administered 2015-06-20: 75 mg via ORAL
  Filled 2015-06-19 (×2): qty 1

## 2015-06-19 MED ORDER — FENTANYL CITRATE (PF) 100 MCG/2ML IJ SOLN
INTRAMUSCULAR | Status: AC
  Filled 2015-06-19: qty 4

## 2015-06-19 MED ORDER — VERAPAMIL HCL 2.5 MG/ML IV SOLN
INTRAVENOUS | Status: DC | PRN
  Administered 2015-06-19: 09:00:00 via INTRA_ARTERIAL

## 2015-06-19 MED ORDER — NITROGLYCERIN 1 MG/10 ML FOR IR/CATH LAB
INTRA_ARTERIAL | Status: AC
  Filled 2015-06-19: qty 10

## 2015-06-19 MED ORDER — SODIUM CHLORIDE 0.9 % WEIGHT BASED INFUSION
3.0000 mL/kg/h | INTRAVENOUS | Status: AC
  Administered 2015-06-19: 3 mL/kg/h via INTRAVENOUS

## 2015-06-19 MED ORDER — IOHEXOL 350 MG/ML SOLN
INTRAVENOUS | Status: DC | PRN
  Administered 2015-06-19: 225 mL via INTRACARDIAC

## 2015-06-19 MED ORDER — LIDOCAINE HCL (PF) 1 % IJ SOLN
INTRAMUSCULAR | Status: AC
  Filled 2015-06-19: qty 30

## 2015-06-19 MED ORDER — CLOPIDOGREL BISULFATE 75 MG PO TABS
ORAL_TABLET | ORAL | Status: AC
  Filled 2015-06-19: qty 1

## 2015-06-19 MED ORDER — BIVALIRUDIN 250 MG IV SOLR
250.0000 mg | INTRAVENOUS | Status: DC | PRN
  Administered 2015-06-19: 1.75 mg/kg/h via INTRAVENOUS

## 2015-06-19 MED ORDER — VERAPAMIL HCL 2.5 MG/ML IV SOLN
INTRAVENOUS | Status: AC
  Filled 2015-06-19: qty 2

## 2015-06-19 MED ORDER — MIDAZOLAM HCL 2 MG/2ML IJ SOLN
INTRAMUSCULAR | Status: DC | PRN
  Administered 2015-06-19: 1 mg via INTRAVENOUS
  Administered 2015-06-19: 2 mg via INTRAVENOUS

## 2015-06-19 MED ORDER — FENTANYL CITRATE (PF) 100 MCG/2ML IJ SOLN
INTRAMUSCULAR | Status: DC | PRN
  Administered 2015-06-19: 25 ug via INTRAVENOUS
  Administered 2015-06-19: 50 ug via INTRAVENOUS
  Administered 2015-06-19: 25 ug via INTRAVENOUS
  Administered 2015-06-19: 50 ug via INTRAVENOUS

## 2015-06-19 MED ORDER — SODIUM CHLORIDE 0.9 % IJ SOLN: 3.0000 mL | Freq: Two times a day (BID) | INTRAMUSCULAR | Status: DC

## 2015-06-19 MED ORDER — BIVALIRUDIN 250 MG IV SOLR
0.2500 mg/kg/h | INTRAVENOUS | Status: DC
  Filled 2015-06-19: qty 250

## 2015-06-19 MED ORDER — MIDAZOLAM HCL 2 MG/2ML IJ SOLN
INTRAMUSCULAR | Status: AC
  Filled 2015-06-19: qty 4

## 2015-06-19 MED ORDER — SODIUM CHLORIDE 0.9 % IV SOLN: 250.0000 mL | INTRAVENOUS | Status: DC | PRN

## 2015-06-19 MED ORDER — SODIUM CHLORIDE 0.9 % IJ SOLN: 3.0000 mL | INTRAMUSCULAR | Status: DC | PRN

## 2015-06-19 MED ORDER — NITROGLYCERIN 1 MG/10 ML FOR IR/CATH LAB
INTRA_ARTERIAL | Status: DC | PRN
  Administered 2015-06-19: 10:00:00

## 2015-06-19 MED ORDER — BIVALIRUDIN 250 MG IV SOLR
INTRAVENOUS | Status: AC
  Filled 2015-06-19: qty 250

## 2015-06-19 MED ORDER — HEPARIN (PORCINE) IN NACL 2-0.9 UNIT/ML-% IJ SOLN
INTRAMUSCULAR | Status: AC
  Filled 2015-06-19: qty 1500

## 2015-06-19 MED ORDER — BIVALIRUDIN BOLUS VIA INFUSION - CUPID
INTRAVENOUS | Status: DC | PRN
  Administered 2015-06-19: 81.75 mg via INTRAVENOUS

## 2015-06-19 SURGICAL SUPPLY — 19 items
BALLN TREK RX 2.25X12 (BALLOONS) ×2
BALLN ~~LOC~~ TREK RX 2.75X12 (BALLOONS) ×2
BALLN ~~LOC~~ TREK RX 3.0X20 (BALLOONS) ×2
BALLOON TREK RX 2.25X12 (BALLOONS) ×1 IMPLANT
BALLOON ~~LOC~~ TREK RX 2.75X12 (BALLOONS) ×1 IMPLANT
BALLOON ~~LOC~~ TREK RX 3.0X20 (BALLOONS) ×1 IMPLANT
CATH VISTA GUIDE 6FR XBLAD3.5 (CATHETERS) ×2 IMPLANT
DEVICE RAD COMP TR BAND LRG (VASCULAR PRODUCTS) ×2 IMPLANT
GLIDESHEATH SLEND SS 6F .021 (SHEATH) ×2 IMPLANT
GUIDE CATH RUNWAY 6FR HS (CATHETERS) ×2 IMPLANT
KIT ENCORE 26 ADVANTAGE (KITS) ×2 IMPLANT
KIT HEART LEFT (KITS) ×2 IMPLANT
PACK CARDIAC CATHETERIZATION (CUSTOM PROCEDURE TRAY) ×2 IMPLANT
STENT SYNERGY DES 2.5X16 (Permanent Stent) ×4 IMPLANT
STENT SYNERGY DES 3X24 (Permanent Stent) ×2 IMPLANT
TRANSDUCER W/STOPCOCK (MISCELLANEOUS) ×2 IMPLANT
TUBING CIL FLEX 10 FLL-RA (TUBING) ×2 IMPLANT
WIRE ASAHI PROWATER 180CM (WIRE) ×2 IMPLANT
WIRE SAFE-T 1.5MM-J .035X260CM (WIRE) ×2 IMPLANT

## 2015-06-19 NOTE — Progress Notes (Signed)
TR BAND REMOVAL  LOCATION:  right radial  DEFLATED PER PROTOCOL:  Yes.    TIME BAND OFF / DRESSING APPLIED:   1530   SITE UPON ARRIVAL:   Level 0  SITE AFTER BAND REMOVAL:  Level 0  REVERSE ALLEN'S TEST:    positive  CIRCULATION SENSATION AND MOVEMENT:  Within Normal Limits  Yes.    COMMENTS:

## 2015-06-19 NOTE — Progress Notes (Signed)
RT set up and placed patient on CPAP of 5cmH20. Patient is wearing a full face mask and is tolerating well at this time.

## 2015-06-19 NOTE — Care Management Important Message (Signed)
Important Message  Patient Details  Name: Troy [name] MRN: [id number] Date of Birth: [birth year]   Medicare Important Message Given:  Yes-second notification given    Delorse Lek 06/19/2015, 2:19 PM

## 2015-06-19 NOTE — Progress Notes (Signed)
Patient complains of sore abdomen, moderate to severe, which he thinks is due to celiac disease.  I asked him what he takes for this at home and he said that he doesn't take anything.

## 2015-06-19 NOTE — Progress Notes (Signed)
ANTICOAGULATION CONSULT NOTE - Follow Up Consult  Pharmacy Consult for Heparin Indication: chest pain/ACS, new onset atrial fibrillation  Allergies  Allergen Reactions  . Gluten Meal Other (See Comments)    Celiac disease  . Whey     Celiac disease  . Tizanidine Anxiety    Depression    Patient Measurements: Height: 5\' 11"  (180.3 cm) Weight: 240 lb 4.8 oz (109 kg) IBW/kg (Calculated) : 75.3 Heparin Dosing Weight: 98 kg  Vital Signs: Temp: 99.7 F (37.6 C) (08/09 0529) Temp Source: Oral (08/09 0529) BP: 140/86 mmHg (08/09 0529) Pulse Rate: 90 (08/09 0529)  Labs:  Recent Labs  06/16/15 1406 06/16/15 1418 06/16/15 1708 06/16/15 1945  06/17/15 0605 06/17/15 0708  06/17/15 2252 06/18/15 0446 06/19/15 0545  HGB  --   --   --   --   --   --  12.7*  --   --  12.5* 12.0*  HCT  --   --   --   --   --   --  38.7*  --   --  37.1* 36.0*  PLT  --   --   --   --   --   --  124*  --   --  123* 120*  APTT  --  31  --   --   --   --   --   --   --   --   --   LABPROT  --  14.3  --   --   --   --   --   --   --   --   --   INR  --  1.09  --   --   --   --   --   --   --   --   --   HEPARINUNFRC  --   --   --   --   < > 0.21*  --   < > 0.38 0.40 0.19*  CREATININE  --   --   --   --   --  0.94  --   --   --  0.95 1.01  TROPONINI <0.03  --  <0.03 <0.03  --   --   --   --   --   --   --   < > = values in this interval not displayed.  Estimated Creatinine Clearance: 91.6 mL/min (by C-G formula based on Cr of 1.01).   Medications:  Infusions:  . heparin 1,700 Units/hr (06/19/15 0700)    Assessment: Pt is 64 y.o. male with a PMH of coronary artery disease who is s/p cath 8/8.  Pt needs PCI (planned for 8/9) of RCA and LAD.  Heparin was restarted 6 hours after TR band removed.  HL low this morning.  Goal of Therapy:  Heparin level 0.3-0.7 units/ml Monitor platelets by anticoagulation protocol: Yes   Plan:  Increase heparin to 1950 units/hr Recheck HL vs f/u after  cath Heparin level and CBC daily while on heparin Monitor s/sx bleeding    Harvel Quale  06/19/2015 7:16 AM

## 2015-06-19 NOTE — Progress Notes (Signed)
Patient currently in cath lab.  Prior consult note reviewed.  PCCM evaluated for pulmonary nodule.  Office follow up arranged (see discharge section) based on prior plan with Dr. Ashok Cordia for decision regarding PET/CT vs repeat CT scan of chest with contrast in 2-3 months.  Will update patient on plan of care once out of cath lab.     PCCM will be available PRN.  Please call back if new needs arise.     Troy Gens, NP-C [facility] Pgr: [phone] or if no answer [phone] 06/19/2015, 9:18 AM

## 2015-06-19 NOTE — Interval H&P Note (Signed)
History and Physical Interval Note:  06/19/2015 8:41 AM  Troy [name]  has presented today for surgery, with the diagnosis of cad  The various methods of treatment have been discussed with the patient and family. After consideration of risks, benefits and other options for treatment, the patient has consented to  Procedure(s): Coronary Stent Intervention (N/A) as a surgical intervention .  The patient's history has been reviewed, patient examined, no change in status, stable for surgery.  I have reviewed the patient's chart and labs.  Questions were answered to the patient's satisfaction.   Cath Lab Visit (complete for each Cath Lab visit)  Clinical Evaluation Leading to the Procedure:   ACS: Yes.    Non-ACS:    Anginal Classification: CCS III  Anti-ischemic medical therapy: Minimal Therapy (1 class of medications)  Non-Invasive Test Results: No non-invasive testing performed  Prior CABG: No previous CABG        Collier Salina [facility] 06/19/2015 8:41 AM

## 2015-06-20 ENCOUNTER — Encounter (HOSPITAL_COMMUNITY): Payer: Self-pay | Admitting: Physician Assistant

## 2015-06-20 ENCOUNTER — Telehealth: Payer: Self-pay | Admitting: Cardiology

## 2015-06-20 ENCOUNTER — Other Ambulatory Visit: Payer: Self-pay | Admitting: Physician Assistant

## 2015-06-20 DIAGNOSIS — I480 Paroxysmal atrial fibrillation: Secondary | ICD-10-CM

## 2015-06-20 DIAGNOSIS — Z9861 Coronary angioplasty status: Secondary | ICD-10-CM

## 2015-06-20 DIAGNOSIS — I2510 Atherosclerotic heart disease of native coronary artery without angina pectoris: Secondary | ICD-10-CM

## 2015-06-20 DIAGNOSIS — I4901 Ventricular fibrillation: Secondary | ICD-10-CM | POA: Diagnosis present

## 2015-06-20 LAB — CBC
HCT: 35.1 % — ABNORMAL LOW (ref 39.0–52.0)
HEMOGLOBIN: 11.7 g/dL — AB (ref 13.0–17.0)
MCH: 29.2 pg (ref 26.0–34.0)
MCHC: 33.3 g/dL (ref 30.0–36.0)
MCV: 87.5 fL (ref 78.0–100.0)
Platelets: 112 10*3/uL — ABNORMAL LOW (ref 150–400)
RBC: 4.01 MIL/uL — ABNORMAL LOW (ref 4.22–5.81)
RDW: 14 % (ref 11.5–15.5)
WBC: 5.1 10*3/uL (ref 4.0–10.5)

## 2015-06-20 LAB — BASIC METABOLIC PANEL
Anion gap: 7 (ref 5–15)
BUN: 10 mg/dL (ref 6–20)
CO2: 24 mmol/L (ref 22–32)
Calcium: 8.6 mg/dL — ABNORMAL LOW (ref 8.9–10.3)
Chloride: 107 mmol/L (ref 101–111)
Creatinine, Ser: 0.88 mg/dL (ref 0.61–1.24)
GFR calc Af Amer: >60 mL/min (ref >60)
GFR calc non Af Amer: >60 mL/min (ref >60)
GLUCOSE: 102 mg/dL — AB (ref 65–99)
POTASSIUM: 4 mmol/L (ref 3.5–5.1)
Sodium: 138 mmol/L (ref 135–145)

## 2015-06-20 LAB — POCT ACTIVATED CLOTTING TIME: ACTIVATED CLOTTING TIME: 497 s

## 2015-06-20 MED ORDER — ATORVASTATIN CALCIUM 80 MG PO TABS: 80.0000 mg | ORAL_TABLET | Freq: Every day | ORAL | Status: DC

## 2015-06-20 MED ORDER — CLOPIDOGREL BISULFATE 75 MG PO TABS: 75.0000 mg | ORAL_TABLET | Freq: Every day | ORAL | Status: DC

## 2015-06-20 MED ORDER — CLOPIDOGREL BISULFATE 75 MG PO TABS
75.0000 mg | ORAL_TABLET | Freq: Every day | ORAL | Status: DC
Start: 2015-06-20 — End: 2016-11-06

## 2015-06-20 MED ORDER — METOPROLOL TARTRATE 50 MG PO TABS: 50.0000 mg | ORAL_TABLET | Freq: Two times a day (BID) | ORAL | Status: DC

## 2015-06-20 MED ORDER — NITROGLYCERIN 0.4 MG SL SUBL: 0.4000 mg | SUBLINGUAL_TABLET | SUBLINGUAL | Status: DC | PRN

## 2015-06-20 MED ORDER — LOSARTAN POTASSIUM 50 MG PO TABS
50.0000 mg | ORAL_TABLET | Freq: Every day | ORAL | Status: DC
Start: 2015-06-20 — End: 2015-07-18

## 2015-06-20 MED ORDER — ASPIRIN 81 MG PO TBEC: 81.0000 mg | DELAYED_RELEASE_TABLET | Freq: Every day | ORAL | Status: DC

## 2015-06-20 NOTE — Progress Notes (Signed)
RN stated patient does not tolerate CPAP. Has home machine but does not tolerate our machine. RT will monitor.

## 2015-06-20 NOTE — Discharge Instructions (Signed)
Angina Pectoris Angina pectoris is extreme discomfort in your chest, neck, or arm. Your doctor may call it just angina. It is caused by a lack of oxygen to your heart wall. It may feel like tightness or heavy pressure. It may feel like a crushing or squeezing pain. Some people say it feels like gas. It may go down your shoulders, back, and arms. Some people have symptoms other than pain. These include:  Tiredness.  Shortness of breath.  Cold sweats.  Feeling sick to your stomach (nausea). There are four types of angina:  Stable angina. This type often lasts the same amount of time each time it happens. Activity, stress, or excitement can bring it on. It often gets better after taking a medicine called nitroglycerin. This goes under your tongue.  Unstable angina. This type can happen when you are not active or even during sleep. It can suddenly get worse or happen more often. It may not get better after taking the special medicine. It can last up to 30 minutes.  Microvascular angina. This type is more common in women. It may be more severe or last longer than other types.  Prinzmetal angina. This type often happens when you are not active or in the early morning hours. HOME CARE   Only take medicines as told by your doctor.  Stay active or exercise more as told by your doctor.  Limit very hard activity as told by your doctor.  Limit heavy lifting as told by your doctor.  Keep a healthy weight.  Learn about and eat foods that are healthy for your heart.  Do not use any tobacco such as cigarettes, chewing tobacco, or e-cigarettes. GET HELP RIGHT AWAY IF:   You have chest, neck, deep shoulder, or arm pain or discomfort that lasts more than a few minutes.  You have chest, neck, deep shoulder, or arm pain or discomfort that goes away and comes back over and over again.  You have heavy sweating that seems to happen for no reason.  You have shortness of breath or trouble  breathing.  Your angina does not get better after a few minutes of rest.  Your angina does not get better after you take nitroglycerin medicine. These can all be symptoms of a heart attack. Get help right away. Call your local emergency service (911 in [place] Do not  drive yourself to the hospital. Do not  wait to for your symptoms to go away. MAKE SURE YOU:   Understand these instructions.  Will watch your condition.  Will get help right away if you are not doing well or get worse. Document Released: 04/14/2008 Document Revised: 11/01/2013 Document Reviewed: 02/28/2014 [facility] Patient Information 2015 [place], [place]. This information is not intended to replace advice given to you by your health care provider. Make sure you discuss any questions you have with your health care provider.  No driving for 24 hours. No lifting over 5 lbs for 1 week. No sexual activity for 1 week. Keep procedure site clean & dry. If you notice increased pain, swelling, bleeding or pus, call/return!  You may shower, but no soaking baths/hot tubs/pools for 1 week.

## 2015-06-20 NOTE — Progress Notes (Signed)
CARDIAC REHAB PHASE I   PRE:  Rate/Rhythm: 78 SR    BP: sitting 149/83    SaO2:   MODE:  Ambulation: 1000 ft   POST:  Rate/Rhythm: 103 ST    BP: sitting 161/98     SaO2:   Tolerated well, feels much better. Sts slight SOB with walking. HR up to 103 ST but pt sts he normally is up to 120. Receptive to ed with girlfriend present. Interested in [facility] and will send referral to [facility]. Understands importance of Plavix/ASA. [phone]  Darrick Meigs CES, ACSM 06/20/2015 8:59 AM

## 2015-06-20 NOTE — Telephone Encounter (Signed)
TCM   Appt with [name] 8/18 @ 9am (Troy [name])  TCM per call from [facility]

## 2015-06-20 NOTE — Progress Notes (Signed)
Subjective:  Feels well; no chest pain; Maintaining NSR  Objective:   Vital Signs : Filed Vitals:   06/19/15 1630 06/19/15 1915 06/19/15 2359 06/20/15 0344  BP: 138/74 139/83 148/98 141/94  Pulse:  83 76 74  Temp:  98.5 F (36.9 C) 99 F (37.2 C) 98.8 F (37.1 C)  TempSrc:  Oral Oral Oral  Resp:  20 20 18   Height:      Weight:   245 lb 2.4 oz (111.2 kg)   SpO2: 92% 97% 92% 94%    Intake/Output from previous day:  Intake/Output Summary (Last 24 hours) at 06/20/15 1005 Last data filed at 06/20/15 0344  Gross per 24 hour  Intake    120 ml  Output    725 ml  Net   -605 ml    I/O since admission:  Wt Readings from Last 3 Encounters:  06/19/15 245 lb 2.4 oz (111.2 kg)    Medications: . aspirin EC  81 mg Oral Daily  . atorvastatin  80 mg Oral q1800  . clopidogrel  75 mg Oral Q breakfast  . losartan  50 mg Oral Daily  . metoprolol tartrate  50 mg Oral BID  . omega-3 acid ethyl esters  1 g Oral BID       Physical Exam:   General appearance: alert, cooperative and no distress Neck: no adenopathy, no carotid bruit, no JVD, supple, symmetrical, trachea midline and thyroid not enlarged, symmetric, no tenderness/mass/nodules Lungs: clear to auscultation bilaterally Heart: regular rate and rhythm Abdomen: soft, non-tender; bowel sounds normal; no masses,  no organomegaly Extremities: no edema, redness or tenderness in the calves or thighs Pulses: 2+ and symmetric Skin:  erythema on back in contour of pad due to defibrillation for VF on 8/8 Neurologic: Grossly normal   Rate: 78-82  Rhythm: normal sinus rhythm  ECG (independently read by me): NSR at 73  Lab Results:   Recent Labs  06/18/15 0446 06/19/15 0545 06/20/15 0323  NA 140 139 138  K 4.6 3.9 4.0  CL 110 107 107  CO2 26 26 24   GLUCOSE 104* 106* 102*  BUN 15 11 10   CREATININE 0.95 1.01 0.88  CALCIUM 8.4* 8.7* 8.6*    Hepatic Function Latest Ref Rng 06/16/2015  Total Protein 6.5 - 8.1 g/dL 7.0    Albumin 3.5 - 5.0 g/dL 4.1  AST 15 - 41 U/L 29  ALT 17 - 63 U/L 38  Alk Phosphatase 38 - 126 U/L 35(L)  Total Bilirubin 0.3 - 1.2 mg/dL 0.8     Recent Labs  06/18/15 0446 06/19/15 0545 06/20/15 0323  WBC 7.7 6.9 5.1  HGB 12.5* 12.0* 11.7*  HCT 37.1* 36.0* 35.1*  MCV 88.5 87.4 87.5  PLT 123* 120* 112*    No results for input(s): TROPONINI in the last 72 hours.  Invalid input(s): CK, MB  Lab Results  Component Value Date   TSH 0.899 06/16/2015   No results for input(s): HGBA1C in the last 72 hours.  No results for input(s): PROT, ALBUMIN, AST, ALT, ALKPHOS, BILITOT, BILIDIR, IBILI in the last 72 hours. No results for input(s): INR in the last 72 hours. BNP (last 3 results)  Recent Labs  06/16/15 0850  BNP 314.3*    ProBNP (last 3 results) No results for input(s): PROBNP in the last 8760 hours.   Lipid Panel     Component Value Date/Time   CHOL 188 06/17/2015 0605   TRIG 169* 06/17/2015 0605   HDL 34*  06/17/2015 0605   CHOLHDL 5.5 06/17/2015 0605   VLDL 34 06/17/2015 0605   LDLCALC 120* 06/17/2015 0605      Coronary Findings    Dominance: Right   Left Anterior Descending  The vessel is moderate in size .   Marland Kitchen Prox LAD lesion, 40% stenosed.   Marland Kitchen PCI: A drug-eluting stent was placed.  . There is no residual stenosis post intervention.     . Mid LAD lesion, 90% stenosed. discrete .   Marland Kitchen PCI: The pre-interventional distal flow is normal (TIMI 3). Pre-stent angioplasty was performed. 2.25 x 12 mm balloon A drug-eluting stent was placed. Post-stent angioplasty was performed. 2.75 x 12 mm [place] balloon Maximum pressure: 16 atm. The post-interventional distal flow is normal (TIMI 3). The intervention was successful. At this lesion, a dissection occurred. After deployment of initial stent in the mid LAD the patient developed chest pain and angiography demonstrated evidence of a dissection extending proximally from the stent edge. A second 3.0 x 24 mm Synergy stent  was placed in overlapping fashion to cover the dissection. this was post dilated with a 3.0 mm [place] balloon to 16 atm. there was an excellent angiographic result with no residual dissection, TIMI 3 flow and 0% residual stenosis.  . Supplies used: STENT SYNERGY DES 2.5X16  . There is no residual stenosis post intervention.     . First Diagonal Branch   The vessel is moderate in size.     Ramus Intermedius  The vessel is moderate in size .   Marland Kitchen Ost Ramus to Ramus lesion, 30% stenosed. diffuse .     Left Circumflex   . Prox Cx to Mid Cx lesion, 20% stenosed. diffuse . The lesion was previously treated with a stent (unknown type) .   Marland Kitchen First Obtuse Marginal Branch   The vessel is small in size.   . Third Obtuse Marginal Branch   The vessel is moderate in size.     Right Coronary Artery   . Prox RCA lesion, 20% stenosed. diffuse .   Marland Kitchen Mid RCA to Dist RCA lesion, 35% stenosed. diffuse .   Marland Kitchen Right Posterior Descending Artery   . Ost RPDA lesion, 95% stenosed. discrete .   Marland Kitchen PCI: The pre-interventional distal flow is normal (TIMI 3). Pre-stent angioplasty was performed. 2.25 x 12 mm balloon A drug-eluting stent was placed. Post-stent angioplasty was performed. 2.75 x 12 mm [place] balloon Maximum pressure: 16 atm. The post-interventional distal flow is normal (TIMI 3). The intervention was successful. No complications occurred at this lesion.  . Supplies used: STENT SYNERGY DES 2.5X16  . There is no residual stenosis post intervention.     . Right Posterior Atrioventricular Branch   . Post Atrio lesion, 40% stenosed. discrete .      Coronary Diagrams    Diagnostic Diagram           Post-Intervention Diagram            Implants    Name ID Temporary Type Supply   STENT SYNERGY DES 2.5X16 - [id number] No Permanent Stent STENT SYNERGY DES 2.5X16   STENT SYNERGY DES 2.5X16 - [id number] No Permanent Stent STENT SYNERGY DES 2.5X16   STENT SYNERGY DES 3X24 - [id number]  [id number] No Permanent Stent STENT SYNERGY DES 3X24    Hemo Data       Most Recent Value   AO Systolic Pressure  778 mmHg   AO Diastolic Pressure  82 mmHg  AO Mean  103 mmHg     Assessment/Plan:   Principal Problem:   Unstable angina Active Problems:   A-fib   Essential hypertension   CAD S/P PCI in [place] 2010   Dyslipidemia  1. CAD: s/p remote PCI/stent to LCX in 2010; s/p 2 vessel PCI to PDA and LAD with synergy stents yesterday; on ASA/Plavix 2. S/P VF at diagnostic cath with RCA/conus injection with defibrillation to NSR and resolution of prior AF 3. PAF: now in sinus; no prior history except on admission; converted with defibrillation as noted. Since sinus rhythm now will continue DAPT and initiate anticoagulation if recurrent AF. 4. HTN: 5. RLL pulmonary nodule: needs pulm f/u as outpatient 6. HLD: now on atorvastatin 80 mg; target LDL <70  Plan dc today with outpatient cardiology and pulmonary f/u   Troy Sine, MD, [facility] 06/20/2015, 10:05 AM

## 2015-06-20 NOTE — Discharge Summary (Signed)
Discharge Summary   Patient ID: Troy [name],  MRN: [id number], DOB/AGE: 1951/06/03 64 y.o.  Admit date: 06/16/2015 Discharge date: 06/20/2015  Primary Care Provider: No PCP Per Patient Primary Cardiologist: Dr. Radford [name]  Discharge Diagnoses Principal Problem:   Unstable angina Active Problems:   A-fib   Essential hypertension   CAD S/P PCI in [place] 2010   Dyslipidemia   Ventricular fibrillation   Allergies Allergies  Allergen Reactions  . Gluten Meal Other (See Comments)    Celiac disease  . Whey     Celiac disease  . Tizanidine Anxiety    Depression    Procedures  Echocardiogram 06/17/2015 LV EF: 50% -  55%  ------------------------------------------------------------------- Indications:   Chest pain 786.51.  ------------------------------------------------------------------- History:  PMH:  Atrial fibrillation. Risk factors: Hypertension.  ------------------------------------------------------------------- Study Conclusions  - Left ventricle: The cavity size was normal. Systolic function was normal. The estimated ejection fraction was in the range of 50% to 55%. Images were inadequate for LV wall motion assessment. - Aortic valve: Trileaflet; mildly thickened, mildly calcified leaflets. There was trivial regurgitation. - Mitral valve: There was mild to moderate regurgitation. - Pulmonic valve: There was trivial regurgitation.     Cardiac catheterization #1 06/18/2015 Conclusion     Ost RPDA lesion, 95% stenosed.  Post Atrio lesion, 40% stenosed.  Mid RCA to Dist RCA lesion, 35% stenosed.  Prox RCA lesion, 20% stenosed.  Prox Cx to Mid Cx lesion, 20% stenosed. The lesion was previously treated with a stent (unknown type) .  Ost Ramus to Ramus lesion, 30% stenosed.  Mid LAD lesion, 90% stenosed.  There is mild left ventricular systolic dysfunction.  1. Severe triple vessel CAD with patent stent mid Circumflex, severe mid LAD  stenosis, severe distal RCA/PDA stenosis.  2. Unstable angina 3. Ventricular fibrillation during diagnostic catheterization 4. Mild LV systolic dysfunction  Recommendations: Will plan PCI of the RCA and LAD tomorrow. Will load with Plavix tonight. Resume heparin 6 hours post sheath pull. PCI will be delayed due to the complications during the diagnostic procedure (v fib arrest).      Cardiac catheterization #2 06/19/2015 Conclusion     Post Atrio lesion, 40% stenosed.  Mid RCA to Dist RCA lesion, 35% stenosed.  Prox RCA lesion, 20% stenosed.  Prox Cx to Mid Cx lesion, 20% stenosed. The lesion was previously treated with a stent (unknown type) .  Ost Ramus to Ramus lesion, 30% stenosed.  Ost RPDA lesion, 95% stenosed. There is a 0% residual stenosis post intervention.  A drug-eluting stent was placed.  Mid LAD lesion, 90% stenosed. There is a 0% residual stenosis post intervention.  A drug-eluting stent was placed.  Prox LAD lesion, 40% stenosed. There is a 0% residual stenosis post intervention.  A drug-eluting stent was placed.      Hospital Course  [name] is 64yo male with PMH of CAD s/p PCI in 2010 in [place], [place]. He presented to the ED on 06/16/2015 with complaints of intermittent chest pain or shortness breath for the past 2-3 month. He also complained of retrosternal chest pain. The chest discomfort was a tight pressure-like sensation radiating to his right shoulder exacerbated by physical exertion. He also endorsed dyspnea on exertion. While in the ED, he was noted to be in atrial fibrillation. Workup included chest CTA which is negative for PE however did reveal 1.5 x 1.2 cm smoothly marginated nodule in the central aspect of the right lower lobe with recommendation correlation with nonemergent PET-CT to exclude  a neoplasm. It was unclear how long he has been in atrial fibrillation which appears to be new for him. He was placed on IV heparin. His symptom was  concerning for unstable angina. CT of the chest showed evidence of atherosclerosis of the left main and 3 vessels. After discussing various benefit and risk, it was decided for him to undergo diagnostic cardiac catheterization.  Echocardiogram was started on 06/17/2015 which showed EF 50-55%, mild-to-moderate MR. He underwent a scheduled cardiac catheterization on 06/18/2015 which showed severe triple vessel CAD with patent stent in the mid LCx, severe mid LAD stenosis, severe distal RCA/PDA stenosis. Patient had ventricular fibrillation during diagnostic cardiac catheterization which was terminated with single defibrillation at 200 J. The shock inadvertently restored him to normal sinus rhythm. He was loaded with Plavix was intention of staged PCI. He underwent staged PCI on 8/9, 95% ostial RPDA stenosis was treated with DES (STENT SYNERGY DES 2.5X16), 90% mid LAD lesion was also treated with DES which also covers to 40% proximal LAD lesion (STENT SYNERGY DES 2.5X16). As for his pulmonary nodule, pulmonary critical care service was consulted. Outpatient follow-up has been arranged with Dr. Milinda [name] regarding PUD/CT versus repeat CT scan of the chest with contrast in 2-3 month.   He was seen in the morning of 06/20/2015, at which time he was doing well without further chest pain. He has been maintaining sinus rhythm. He is deemed stable for discharge from cardiology perspective. He will follow-up with pulmonary service as outpatient. I will arrange two-week follow-up in the office. I have discussed with Dr. Claiborne [name], we will hold off on initiating systemic anticoagulation at this time given his need for DAPT and risk of bleeding. It is possible that atrial fibrillation occurred in the setting of ischemia. I will arrange 30 day outpatient event monitor to see if he has any recurrence.   Discharge Vitals Blood pressure 141/94, pulse 74, temperature 98.8 F (37.1 C), temperature source Oral, resp. rate 18, height 5\' 11"   (1.803 m), weight 245 lb 2.4 oz (111.2 kg), SpO2 94 %.  Filed Weights   06/16/15 1333 06/19/15 0039 06/19/15 2359  Weight: 241 lb (109.317 kg) 240 lb 4.8 oz (109 kg) 245 lb 2.4 oz (111.2 kg)    Labs  CBC  Recent Labs  06/19/15 0545 06/20/15 0323  WBC 6.9 5.1  HGB 12.0* 11.7*  HCT 36.0* 35.1*  MCV 87.4 87.5  PLT 120* 852*   Basic Metabolic Panel  Recent Labs  06/19/15 0545 06/20/15 0323  NA 139 138  K 3.9 4.0  CL 107 107  CO2 26 24  GLUCOSE 106* 102*  BUN 11 10  CREATININE 1.01 0.88  CALCIUM 8.7* 8.6*    Disposition  Pt is being discharged home today in good condition.  Follow-up Plans & Appointments      Follow-up Information    Follow up with Tera Partridge, MD On 07/06/2015.   Specialty:  Pulmonary Disease   Why:  Appt at 10:00 AM for pulmonary follow up.     Contact information:   [address] [place] [address] [phone]       Follow up with [name], PA-C On 06/28/2015.   Specialties:  Cardiology, Radiology   Why:  9:00am. Cardiology followup   Contact information:   [address] [place] [address] [phone]       Follow up with [facility] On 06/21/2015.   Why:  10:30am. Set up 30  day event monitor   Contact information:   [place] [address] [place] [address] [phone]      Discharge Medications    Medication List    STOP taking these medications        losartan-hydrochlorothiazide 100-25 MG per tablet  Commonly known as:  HYZAAR      TAKE these medications        aspirin 81 MG EC tablet  Take 1 tablet (81 mg total) by mouth daily.  Notes to Patient:  Prevents clotting at stent and heart attack      CHANGED DOSE FROM 325 MG TO 81 MG     atorvastatin 80 MG tablet  Commonly known as:  LIPITOR  Take 1 tablet (80 mg total) by mouth daily at 6 PM.     clopidogrel 75 MG tablet  Commonly known as:  PLAVIX  Take 1 tablet (75 mg total) by mouth daily  with breakfast.     Fish Oil 500 MG Caps  Take 2,000 mg by mouth daily.  Notes to Patient:  Check with pharmacist regarding dose     losartan 50 MG tablet  Commonly known as:  COZAAR  Take 1 tablet (50 mg total) by mouth daily.     metoprolol 50 MG tablet  Commonly known as:  LOPRESSOR  Take 1 tablet (50 mg total) by mouth 2 (two) times daily.     nitroGLYCERIN 0.4 MG SL tablet  Commonly known as:  NITROSTAT  Place 1 tablet (0.4 mg total) under the tongue every 5 (five) minutes as needed for chest pain.     sodium chloride 2 % ophthalmic solution  Commonly known as:  MURO 128  Place 1 drop into both eyes every 4 (four) hours as needed for irritation.        Duration of Discharge Encounter   Greater than 30 minutes including physician time.  Hilbert Corrigan PA-C Pager: [phone] 06/20/2015, 12:04 PM

## 2015-06-21 ENCOUNTER — Ambulatory Visit (INDEPENDENT_AMBULATORY_CARE_PROVIDER_SITE_OTHER): Payer: Medicare Other

## 2015-06-21 DIAGNOSIS — I480 Paroxysmal atrial fibrillation: Secondary | ICD-10-CM

## 2015-06-27 ENCOUNTER — Encounter: Payer: Self-pay | Admitting: Physician Assistant

## 2015-06-27 DIAGNOSIS — I340 Nonrheumatic mitral (valve) insufficiency: Secondary | ICD-10-CM | POA: Insufficient documentation

## 2015-06-27 DIAGNOSIS — R911 Solitary pulmonary nodule: Secondary | ICD-10-CM | POA: Insufficient documentation

## 2015-06-27 NOTE — Progress Notes (Signed)
Cardiology Office Note Date:  06/28/2015  Patient ID:  Troy, [name] 1950-11-26, MRN [id number] PCP:  No PCP Per Patient  Cardiologist:  Radford Pax  Chief Complaint: f/u stenting  History of Present Illness: Troy [name] is a 64 y.o. male with history of CAD s/p PCI in 2010 in [place], HTN, dyslipidemia, sleep apnea, recently diagnosed atrial fib, pulmonary nodule, mild-mod MR, ventricular fibrillation who presents for post-hospital followup. He was admitted 8/6-8/10 with chest pain and SOB for 2-3 months. While in the ED, he was noted to be in atrial fibrillation of unknown duration. Workup included chest CTA which was negative for PE however did reveal 1.5 x 1.2 cm smoothly marginated nodule in the central aspect of the right lower lobe with recommendation correlation with nonemergent PET-CT to exclude a neoplasm. He was placed on IV heparin for AF and concern for [place]. Echocardiogram was started on 06/17/2015 which showed EF 50-55%, mild-to-moderate MR. CT of the chest showed evidence of atherosclerosis of the left main and 3 vessels. He subsequently underwent cath showing severe triple vessel CAD with patent stent in the mid LCx, severe mid LAD stenosis, severe distal RCA/PDA stenosis. He had ventricular fibrillation during diagnostic cardiac catheterization which was terminated with single defibrillation at 200 J. The shock inadvertently restored him to normal sinus rhythm. He was loaded with Plavix was intention of staged PCI. He underwent staged PCI on 8/9 and his 95% ostial RPDA stenosis was treated with DES (STENT SYNERGY DES 2.5X16), 90% mid LAD lesion was treated with DES which also covered to 40% proximal LAD lesion (STENT SYNERGY DES 2.5X16). It was felt that his AF was precipitated by ischemia thus the decision was made to hold off systemic anticoagulation pending further recurrence. 30 day event monitor was set up. As for his pulmonary nodule, pulmonary critical care service was  consulted. Outpatient follow-up has been arranged with Dr. Milinda Hirschfeld regarding PUD/CT versus repeat CT scan of the chest with contrast in 2-3 month. Last labs notable for Cr 0.88, Hgb 12.0 with platelet 120, LDL 120. Atorvastatin was titrated to 80mg  daily.  He presents for follow-up feeling well. He is wearing an event monitor. Denies any further recurrence of CP, SOB or palpitations. No LEE, presyncope or syncope. He visited the cardiac rehab center once but found the music way too loud so he has elected to gradually increase his activity on his own. Denies any bleeding.   Past Medical History  Diagnosis Date  . Hypertension   . Sleep apnea, obstructive   . CAD (coronary artery disease)     a. s/p PCI in 2010 in [place]. b. Unstable angina/LHC 12/12/2977 complicated by V fib arrest after contrast injection. Cath 06/19/2015 s/p DES to mid LAD and RPDA.  Marland Kitchen PAF (paroxysmal atrial fibrillation)     a. noted to be in new a-fib when arrived with unstable angina. Converted to NSR after being shocked in the cath lab for vfib. Holding off on starting systemic anticoagulation since it may have occured in the setting ischemia. Pending 30 day event monitor.   . Pulmonary nodule   . Ventricular fibrillation     a. occured during cath 06/18/2015.  Marland Kitchen Dyslipidemia   . Pulmonary nodule     a. 1.5 x 1.2 cm smoothly marginated nodule in the central aspect of the right lower lobe with recommendation correlation with nonemergent PET-CT to exclude a neoplasm - f/u pulm planned.  . Mitral regurgitation     a. Mild-mod by echo 06/2015.  Past Surgical History  Procedure Laterality Date  . Replacement total knee Left   . Coronary angioplasty with stent placement    . Cardiac catheterization N/A 06/18/2015    Procedure: Left Heart Cath and Coronary Angiography;  Surgeon: Burnell Blanks, MD;  Location: [facility];  Service: Cardiovascular;  Laterality: N/A;  . Cardiac catheterization N/A 06/19/2015     Procedure: Coronary Stent Intervention;  Surgeon: Peter M Martinique, MD;  Location: [facility];  Service: Cardiovascular;  Laterality: N/A;    Current Outpatient Prescriptions  Medication Sig Dispense Refill  . aspirin EC 81 MG EC tablet Take 1 tablet (81 mg total) by mouth daily.    Marland Kitchen atorvastatin (LIPITOR) 80 MG tablet Take 1 tablet (80 mg total) by mouth daily at 6 PM. 90 tablet 3  . clopidogrel (PLAVIX) 75 MG tablet Take 1 tablet (75 mg total) by mouth daily with breakfast. 90 tablet 3  . losartan (COZAAR) 50 MG tablet Take 1 tablet (50 mg total) by mouth daily. 90 tablet 3  . metoprolol tartrate (LOPRESSOR) 50 MG tablet Take 1 tablet (50 mg total) by mouth 2 (two) times daily. 60 tablet 11  . nitroGLYCERIN (NITROSTAT) 0.4 MG SL tablet Place 1 tablet (0.4 mg total) under the tongue every 5 (five) minutes as needed for chest pain. 25 tablet 3  . Omega-3 Fatty Acids (FISH OIL) 500 MG CAPS Take 2,000 mg by mouth daily.     . sodium chloride (MURO 128) 2 % ophthalmic solution Place 1 drop into both eyes every 4 (four) hours as needed for irritation.     No current facility-administered medications for this visit.    Allergies:   Gluten meal; Tizanidine; Whey; and Morphine and related   Social History:  The patient  reports that he has never smoked. He has never used smokeless tobacco. He reports that he drinks alcohol. He reports that he does not use illicit drugs.   Family History:  The patient's family history includes Cancer in his mother; Diabetes in his mother and sister; Drug abuse in his father; Heart attack in his father.  ROS:  Please see the history of present illness.    All other systems are reviewed and otherwise negative.   PHYSICAL EXAM:  VS:  BP 136/88 mmHg  Pulse 62  Ht 5\' 11"  (1.803 m)  Wt 234 lb 6.4 oz (106.323 kg)  BMI 32.71 kg/m2 BMI: Body mass index is 32.71 kg/(m^2). Well nourished, well developed WM in no acute distress HEENT: normocephalic,  atraumatic Neck: no JVD, carotid bruits or masses Cardiac:  normal S1, S2; RRR; no murmurs, rubs, or gallops Lungs:  clear to auscultation bilaterally, no wheezing, rhonchi or rales Abd: soft, nontender, no hepatomegaly, + BS MS: no deformity or atrophy Ext: no edema. right radial cath site without hematoma or ecchymosis; good pulse Skin: warm and dry, no rash Neuro:  moves all extremities spontaneously, no focal abnormalities noted, follows commands Psych: euthymic mood, full affect   EKG:  Done today shows NSR 62bpm, nonspecific TW changes in III, avF (similar to prior), otherwise nonacute; QTC 474ms  Recent Labs: 06/16/2015: ALT 38; B Natriuretic Peptide 314.3*; TSH 0.899 06/20/2015: BUN 10; Creatinine, Ser 0.88; Hemoglobin 11.7*; Platelets 112*; Potassium 4.0; Sodium 138  06/17/2015: Cholesterol 188; HDL 34*; LDL Cholesterol 120*; Total CHOL/HDL Ratio 5.5; Triglycerides 169*; VLDL 34   Estimated Creatinine Clearance: 103.8 mL/min (by C-G formula based on Cr of 0.88).   Wt Readings from  Last 3 Encounters:  06/28/15 234 lb 6.4 oz (106.323 kg)  06/19/15 245 lb 2.4 oz (111.2 kg)     Other studies reviewed: Additional studies/records reviewed today include: summarized above  ASSESSMENT AND PLAN:  1. CAD s/p PCI as above, c/b VF in cath lab s/p shock - doing very well post-PCI. Continue ASA, BB, statin, Plavix. 2. Atrial fibrillation - recently diagnosed of unclear duration, inadvertently converted to NSR after being shocked for VF. Possibly due to ischemia -- the decision was made to hold off systemic anticoagulation pending further recurrence as discussed above. 30 day event monitor was set up for surveillance. Recent TSH WNL. Await those results. 3. Essential hypertension - controlled today. 4. Dyslipidemia - f/u lipids/LFTs in 2 months when he comes back for his follow-up. 5. Thrombocytopenia - ? Procedurally related. No h/o alcohol abuse. Will recheck today to trend.  Disposition:  F/u with Dr. Radford Pax in 2 months. He also has f/u planned with pulm for his pulm nodule.  Current medicines are reviewed at length with the patient today.  The patient did not have any concerns regarding medicines. I did not bill a TOC visit because I do not see documented phone call conversation with the patient.  Raechel Ache PA-C 06/28/2015 9:12 AM     [facility] [address] [place] [address] [phone] (office)  [phone] (fax)

## 2015-06-28 ENCOUNTER — Telehealth: Payer: Self-pay | Admitting: *Deleted

## 2015-06-28 ENCOUNTER — Ambulatory Visit (INDEPENDENT_AMBULATORY_CARE_PROVIDER_SITE_OTHER): Payer: Medicare Other | Admitting: Physician Assistant

## 2015-06-28 ENCOUNTER — Other Ambulatory Visit: Payer: Self-pay | Admitting: Physician Assistant

## 2015-06-28 ENCOUNTER — Encounter: Payer: Self-pay | Admitting: Physician Assistant

## 2015-06-28 VITALS — BP 136/88 | HR 62 | Ht 71.0 in | Wt 234.4 lb

## 2015-06-28 DIAGNOSIS — Z9861 Coronary angioplasty status: Secondary | ICD-10-CM | POA: Diagnosis not present

## 2015-06-28 DIAGNOSIS — E785 Hyperlipidemia, unspecified: Secondary | ICD-10-CM | POA: Diagnosis not present

## 2015-06-28 DIAGNOSIS — I480 Paroxysmal atrial fibrillation: Secondary | ICD-10-CM | POA: Diagnosis not present

## 2015-06-28 DIAGNOSIS — I2510 Atherosclerotic heart disease of native coronary artery without angina pectoris: Secondary | ICD-10-CM

## 2015-06-28 DIAGNOSIS — D696 Thrombocytopenia, unspecified: Secondary | ICD-10-CM | POA: Diagnosis not present

## 2015-06-28 DIAGNOSIS — I10 Essential (primary) hypertension: Secondary | ICD-10-CM

## 2015-06-28 LAB — CBC WITH DIFFERENTIAL/PLATELET
Basophils Absolute: 0 10*3/uL (ref 0.0–0.1)
Basophils Relative: 0.4 % (ref 0.0–3.0)
EOS ABS: 0.1 10*3/uL (ref 0.0–0.7)
Eosinophils Relative: 1.3 % (ref 0.0–5.0)
HEMATOCRIT: 41 % (ref 39.0–52.0)
Hemoglobin: 13.8 g/dL (ref 13.0–17.0)
LYMPHS ABS: 1.7 10*3/uL (ref 0.7–4.0)
LYMPHS PCT: 24.1 % (ref 12.0–46.0)
MCHC: 33.8 g/dL (ref 30.0–36.0)
MCV: 86.5 fl (ref 78.0–100.0)
Monocytes Absolute: 0.6 10*3/uL (ref 0.1–1.0)
Monocytes Relative: 8.8 % (ref 3.0–12.0)
NEUTROS ABS: 4.5 10*3/uL (ref 1.4–7.7)
Neutrophils Relative %: 65.4 % (ref 43.0–77.0)
PLATELETS: 194 10*3/uL (ref 150.0–400.0)
RBC: 4.74 Mil/uL (ref 4.22–5.81)
RDW: 13.8 % (ref 11.5–15.5)
WBC: 6.9 10*3/uL (ref 4.0–10.5)

## 2015-06-28 MED ORDER — METOPROLOL TARTRATE 50 MG PO TABS: 75.0000 mg | ORAL_TABLET | Freq: Two times a day (BID) | ORAL | Status: DC

## 2015-06-28 MED ORDER — ASPIRIN 81 MG PO TBEC: 81.0000 mg | DELAYED_RELEASE_TABLET | Freq: Every day | ORAL | Status: DC

## 2015-06-28 MED ORDER — APIXABAN 5 MG PO TABS: 5.0000 mg | ORAL_TABLET | Freq: Two times a day (BID) | ORAL | Status: DC

## 2015-06-28 NOTE — Telephone Encounter (Signed)
Reviewed tracing with Dr. Caryl Comes. Showed atrial fib 100-120. I called and spoke with patient. He was laying down for a nap when the AF was first noted. He does think he felt some palpitations, but denied any CP, SOB or feeling bad. His HR still feels slightly elevated. D/w Dr. Caryl Comes [facility] DOD) and Dr. Martinique (did PCI). At this point we'll plan to start Eliquis 5mg  BID, continue Plavix, and discontinue aspirin after 30 days post-stent. I discussed this recommendation with the patient, including bleeding risks and importance of observation of symptoms of bleeding. We also discussed alternatives like Coumadin. He is agreeable to NOAC (Eliquis) Last day of aspirin will be 07/20/15 - he is aware. Have sent in rx for Eliquis to [facility] [place] to begin tonight - he believes his usual pharmacy [facility] is closed by now. We will also increase metoprolol to 75mg  BID (he requested I send this rx instead into [facility] in [place] since he has this med at home already and can just increase with the tablets he has for now). He verbalized understanding of plan. I told the patient I would review with Dr. Radford Pax to see if she had any further thoughts on management. Will forward this message her way.  Dayna Dunn PA-C

## 2015-06-28 NOTE — Telephone Encounter (Signed)
Agree with plan and will need to be seen by me or extender in a few weeks

## 2015-06-28 NOTE — Telephone Encounter (Signed)
Received a call from [place] regarding 30 day event monitor  Patient recording shows Atrial Fibrillation Called and spoke with patient and he stated at the time of the recording he was actually napping Denies any symptoms and states he feels fine He did take his blood pressure with home monitor and it read HR at 130 and blood pressure in 140's/90's Wife took radial pulse and heart rate 130 Did have the patient press the record button to send over current reading to [place] Discussed with Dayna D PA and she will discuss with Dr Caryl Comes

## 2015-06-28 NOTE — Patient Instructions (Signed)
Medication Instructions:  Your physician recommends that you continue on your current medications as directed. Please refer to the Current Medication list given to you today.   Labwork: Cbc today  Your physician recommends that you return for a FASTING lipid profile and lft in 2 months   Testing/Procedures: None   Follow-Up: Your physician recommends that you schedule a follow-up appointment in: 2 months with Dr.Turner   Any Other Special Instructions Will Be Listed Below (If Applicable).

## 2015-06-29 NOTE — Telephone Encounter (Signed)
Patient stated feels like he is back in NSR and heart rate and blood pressure are good

## 2015-06-29 NOTE — Telephone Encounter (Signed)
Advised patient, verbalized understanding Samples and copay cards for Eliqus left at front desk  Patient stated he called last night and left message he would not be able to afford Eliquis, has no drug coverage Scheduled follow up appointment

## 2015-06-29 NOTE — Telephone Encounter (Signed)
Please let patient know Dr. Radford Pax agrees with plan - please see her note. Needs to be seen by her or extender in a few weeks. Please have patient monitor his HR and call if running >100 after increase of metoprolol.  Forest Redwine PA-C

## 2015-07-03 NOTE — Telephone Encounter (Signed)
No documentation patient was contacted for TCM follow up, but he was seen on 8/18 by [name], PA. Closing encounter.

## 2015-07-05 ENCOUNTER — Telehealth: Payer: Self-pay | Admitting: Pulmonary Disease

## 2015-07-05 NOTE — Telephone Encounter (Signed)
IMAGING CTA CHEST 06/16/15 (personally reviewed by me):  No pulmonary embolus. No pathologic mediastinal adenopathy. Air present in esophagus to the cervical spine. No pleural effusion or thickening. No pericardial effusion. 1.5x1.2cm rounded nodular density in the fissure adjacent the pulmonary artery & right lower lobe bronchus. No other nodules or opacities appreciated.  LABS 06/28/15 CBC:  6.9/13.8/41.0/194

## 2015-07-06 ENCOUNTER — Encounter: Payer: Self-pay | Admitting: Pulmonary Disease

## 2015-07-06 ENCOUNTER — Ambulatory Visit (INDEPENDENT_AMBULATORY_CARE_PROVIDER_SITE_OTHER): Payer: Medicare Other | Admitting: Pulmonary Disease

## 2015-07-06 VITALS — BP 136/84 | HR 64 | Ht 71.0 in | Wt 235.0 lb

## 2015-07-06 DIAGNOSIS — IMO0001 Reserved for inherently not codable concepts without codable children: Secondary | ICD-10-CM

## 2015-07-06 DIAGNOSIS — R911 Solitary pulmonary nodule: Secondary | ICD-10-CM

## 2015-07-06 DIAGNOSIS — I2510 Atherosclerotic heart disease of native coronary artery without angina pectoris: Secondary | ICD-10-CM | POA: Diagnosis not present

## 2015-07-06 DIAGNOSIS — Z9861 Coronary angioplasty status: Secondary | ICD-10-CM | POA: Diagnosis not present

## 2015-07-06 DIAGNOSIS — R042 Hemoptysis: Secondary | ICD-10-CM | POA: Insufficient documentation

## 2015-07-06 DIAGNOSIS — J3089 Other allergic rhinitis: Secondary | ICD-10-CM | POA: Insufficient documentation

## 2015-07-06 NOTE — Patient Instructions (Signed)
1.  We are going to repeat a Chest CT the week of 9/6 to followup on your lung nodule. 2.  I will see you back in 3-4 weeks to review the results of the CT scan.

## 2015-07-06 NOTE — Progress Notes (Signed)
Subjective:    Patient ID: Troy [name], male    DOB: [birth year], 64 y.o.   MRN: [id number]  HPI This is a follow-up visit from recent hospitalization with a right sided lung nodule seen on chest imaging. I performed consultation during that admission prior to left heart catheterization. He had 3 DES placed. He reports that for a month & half prior to his hospitalization he had a "violent" cough that would sometimes produce a post tussive emesis. He also had some blood mixed in with his mucus. He reports his cough ended just prior to his hospital admission. He denies any fever, chills, or sweats. He has had no further hemoptysis. He reports he still has some dyspnea even after his hospitalization. He notes dyspnea now with exercise. He has been doing a 20-30 minute walk daily. He denies any wheezing. No fever, chills, or sweats. No dysphagia or odynophagia. No abdominal pain or diarrhea. No melena or hematochezia. He has an occaisonal headache. No focal vision loss, weakness, numbness or tingling. He does have some numbness in his hands bilaterally if he sleeps with them on his chest but this resolves. No adenopathy in his neck, groin, or axilla.  Review of Systems He reports a chronic post-nasal drip. He denies any sinus congestion or pressure. No dysuria or hematuria. He reports frequent urination.  A pertinent 14 point review of systems is negative except as per the history of presenting illness.  Allergies  Allergen Reactions  . Gluten Meal Other (See Comments)    REACTION: Celiac disease  . Tizanidine Anxiety    Depression  . Whey Other (See Comments)    REACTION: Celiac disease  . Morphine And Related Other (See Comments)    REACTION: "REALLY BAD STOMACH PAIN"   Past Medical History  Diagnosis Date  . Hypertension   . Sleep apnea, obstructive   . CAD (coronary artery disease)     a. s/p PCI in 2010 in [place]. b. Unstable angina/LHC 03/16/8468 complicated by V fib arrest after  contrast injection. Cath 06/19/2015 s/p DES to mid LAD and RPDA.  Marland Kitchen PAF (paroxysmal atrial fibrillation)     a. noted to be in new a-fib when arrived with unstable angina. Converted to NSR after being shocked in the cath lab for vfib. Holding off on starting systemic anticoagulation since it may have occured in the setting ischemia. Pending 30 day event monitor.   . Pulmonary nodule   . Ventricular fibrillation     a. occured during cath 06/18/2015.  Marland Kitchen Dyslipidemia   . Pulmonary nodule     a. 1.5 x 1.2 cm smoothly marginated nodule in the central aspect of the right lower lobe with recommendation correlation with nonemergent PET-CT to exclude a neoplasm - f/u pulm planned.  . Mitral regurgitation     a. Mild-mod by echo 06/2015.   Past Surgical History  Procedure Laterality Date  . Replacement total knee Left   . Coronary angioplasty with stent placement    . Cardiac catheterization N/A 06/18/2015    Procedure: Left Heart Cath and Coronary Angiography;  Surgeon: Burnell Blanks, MD;  Location: [facility];  Service: Cardiovascular;  Laterality: N/A;  . Cardiac catheterization N/A 06/19/2015    Procedure: Coronary Stent Intervention;  Surgeon: Peter M Martinique, MD;  Location: [facility];  Service: Cardiovascular;  Laterality: N/A;   Family History  Problem Relation Age of Onset  . Cancer Mother   . Heart attack Father   .  Diabetes Mother   . Diabetes Sister   . Drug abuse Father    Social History   Social History  . Marital Status: Married    Spouse Name: N/A  . Number of Children: N/A  . Years of Education: N/A   Social History Main Topics  . Smoking status: Never Smoker   . Smokeless tobacco: Never Used  . Alcohol Use: 0.0 oz/week    0 Standard drinks or equivalent per week     Comment: occ  . Drug Use: No  . Sexual Activity: Not Asked   Other Topics Concern  . None   Social History Narrative      Objective:   Physical Exam Blood pressure 136/84,  pulse 64, height 5\' 11"  (1.803 m), weight 235 lb (106.595 kg), SpO2 98 %. General:  Awake. Alert. No acute distress. Caucasian male.  Integument:  Warm & dry. No rash on exposed skin. No bruising. Lymphatics:  No appreciated cervical or supraclavicular lymphadenoapthy. HEENT:  Moist mucus membranes. No oral ulcers. No scleral injection or icterus. Moderate bilateral nasal turbinate swelling left greater than right. PERRL. Cardiovascular:  Regular rate. No edema. No appreciable JVD.  Pulmonary:  Good aeration & clear to auscultation bilaterally. Symmetric chest wall expansion. No accessory muscle use. Abdomen: Soft. Normal bowel sounds. Nondistended. Grossly nontender. Musculoskeletal:  Normal bulk and tone. Hand grip strength 5/5 bilaterally. No joint deformity or effusion appreciated. Neurological:  CN 2-12 grossly in tact. No meningismus. Moving all 4 extremities equally. Symmetric patellar deep tendon reflexes. Psychiatric:  Mood and affect congruent. Speech normal rhythm, rate & tone.   IMAGING CTA CHEST 06/16/15 (personally reviewed by me): No pulmonary embolus. No pathologic mediastinal adenopathy. Air present in esophagus to the cervical spine. No pleural effusion or thickening. No pericardial effusion. 1.5x1.2cm rounded nodular density in the fissure adjacent the pulmonary artery & right lower lobe bronchus. No other nodules or opacities appreciated.  LABS 06/28/15 CBC: 6.9/13.8/41.0/194    Assessment & Plan:  64 year old male status post recent hospitalization found to have a right lung nodule. I suspect this is likely residual inflammatory changes after his illness over the preceding month. He did previously have hemoptysis that has resolved completely. The patient underwent left heart catheterization with drug-eluting stent placement for coronary artery disease. We discussed PET/CT scan and repeat chest CT scan but at this time given his history I feel that repeating a CT scan  approximately 4 weeks after his original is the most reasonable course of action. I instructed the patient contact me if you have any further questions or concerns. I personally reviewed his chest CT with him today during his office visit.  1. Right lung nodule: Plan to repeat chest CT without contrast the week of 9/6. Further imaging with PET/CT depending upon this result. 2. Hemoptysis: Resolved. Likely due to prior infectious process. 3. Follow-up: Patient to return to clinic in 3-4 weeks after his CT scan.

## 2015-07-10 ENCOUNTER — Encounter: Payer: Medicare Other | Admitting: Cardiology

## 2015-07-12 DIAGNOSIS — R911 Solitary pulmonary nodule: Secondary | ICD-10-CM

## 2015-07-12 HISTORY — DX: Solitary pulmonary nodule: R91.1

## 2015-07-13 ENCOUNTER — Encounter: Payer: Self-pay | Admitting: Cardiology

## 2015-07-17 ENCOUNTER — Encounter: Payer: Self-pay | Admitting: *Deleted

## 2015-07-18 ENCOUNTER — Other Ambulatory Visit: Payer: Self-pay | Admitting: Nurse Practitioner

## 2015-07-18 ENCOUNTER — Ambulatory Visit (INDEPENDENT_AMBULATORY_CARE_PROVIDER_SITE_OTHER): Payer: Medicare Other | Admitting: Nurse Practitioner

## 2015-07-18 ENCOUNTER — Ambulatory Visit (INDEPENDENT_AMBULATORY_CARE_PROVIDER_SITE_OTHER)
Admission: RE | Admit: 2015-07-18 | Discharge: 2015-07-18 | Disposition: A | Discharge status: Home / Self Care | Payer: Medicare Other | Source: Ambulatory Visit | Attending: Pulmonary Disease | Admitting: Pulmonary Disease

## 2015-07-18 ENCOUNTER — Encounter: Payer: Self-pay | Admitting: Nurse Practitioner

## 2015-07-18 VITALS — BP 142/90 | HR 55 | Ht 71.0 in | Wt 235.8 lb

## 2015-07-18 DIAGNOSIS — R918 Other nonspecific abnormal finding of lung field: Secondary | ICD-10-CM | POA: Diagnosis not present

## 2015-07-18 DIAGNOSIS — R911 Solitary pulmonary nodule: Secondary | ICD-10-CM | POA: Diagnosis not present

## 2015-07-18 DIAGNOSIS — Z6832 Body mass index (BMI) 32.0-32.9, adult: Secondary | ICD-10-CM | POA: Diagnosis not present

## 2015-07-18 DIAGNOSIS — I10 Essential (primary) hypertension: Secondary | ICD-10-CM

## 2015-07-18 DIAGNOSIS — E785 Hyperlipidemia, unspecified: Secondary | ICD-10-CM | POA: Diagnosis not present

## 2015-07-18 DIAGNOSIS — I2510 Atherosclerotic heart disease of native coronary artery without angina pectoris: Secondary | ICD-10-CM | POA: Diagnosis not present

## 2015-07-18 DIAGNOSIS — Z1389 Encounter for screening for other disorder: Secondary | ICD-10-CM | POA: Diagnosis not present

## 2015-07-18 DIAGNOSIS — IMO0001 Reserved for inherently not codable concepts without codable children: Secondary | ICD-10-CM

## 2015-07-18 DIAGNOSIS — Z9861 Coronary angioplasty status: Secondary | ICD-10-CM

## 2015-07-18 DIAGNOSIS — I25118 Atherosclerotic heart disease of native coronary artery with other forms of angina pectoris: Secondary | ICD-10-CM | POA: Diagnosis not present

## 2015-07-18 DIAGNOSIS — I480 Paroxysmal atrial fibrillation: Secondary | ICD-10-CM

## 2015-07-18 DIAGNOSIS — F418 Other specified anxiety disorders: Secondary | ICD-10-CM | POA: Diagnosis not present

## 2015-07-18 LAB — CBC WITH DIFFERENTIAL/PLATELET
BASOS PCT: 0.3 % (ref 0.0–3.0)
Basophils Absolute: 0 10*3/uL (ref 0.0–0.1)
EOS ABS: 0.1 10*3/uL (ref 0.0–0.7)
EOS PCT: 1.7 % (ref 0.0–5.0)
HEMATOCRIT: 40.1 % (ref 39.0–52.0)
Hemoglobin: 13.6 g/dL (ref 13.0–17.0)
Lymphocytes Relative: 28.2 % (ref 12.0–46.0)
Lymphs Abs: 1.7 10*3/uL (ref 0.7–4.0)
MCHC: 33.8 g/dL (ref 30.0–36.0)
MCV: 86.5 fl (ref 78.0–100.0)
Monocytes Absolute: 0.6 10*3/uL (ref 0.1–1.0)
Monocytes Relative: 10.1 % (ref 3.0–12.0)
NEUTROS ABS: 3.5 10*3/uL (ref 1.4–7.7)
Neutrophils Relative %: 59.7 % (ref 43.0–77.0)
PLATELETS: 121 10*3/uL — AB (ref 150.0–400.0)
RBC: 4.64 Mil/uL (ref 4.22–5.81)
RDW: 14 % (ref 11.5–15.5)
WBC: 5.9 10*3/uL (ref 4.0–10.5)

## 2015-07-18 LAB — BASIC METABOLIC PANEL
BUN: 21 mg/dL (ref 6–23)
CHLORIDE: 107 meq/L (ref 96–112)
CO2: 28 meq/L (ref 19–32)
Calcium: 9.3 mg/dL (ref 8.4–10.5)
Creatinine, Ser: 0.85 mg/dL (ref 0.40–1.50)
GFR: 95.93 mL/min (ref >60.00)
Glucose, Bld: 94 mg/dL (ref 70–99)
POTASSIUM: 4 meq/L (ref 3.5–5.1)
Sodium: 142 mEq/L (ref 135–145)

## 2015-07-18 IMAGING — CT CT CHEST W/O CM
2 of 4 series · 15 of 36 positions shown, 18 images · IV contrast (Omnipaque 300)
Comparison: Chest CT angiogram [DATE]

CLINICAL DATA: Right hilar lesions seen on recent chest CT

EXAM:
CT CHEST WITHOUT CONTRAST
TECHNIQUE: Multidetector CT imaging of the chest was performed following the
standard protocol without IV contrast.

[Series 2: chest routine with · axial · 0.78mm/px · z∈[-318,-42]mm · 12 of 65 slices shown, 15 images]
[im 5/65  mediastinal]
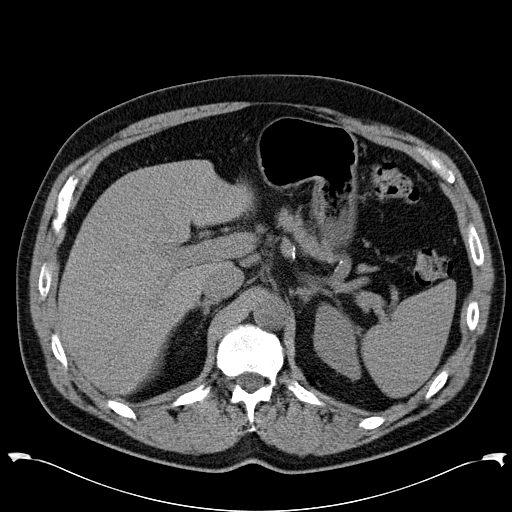
[im 5/65  lung]
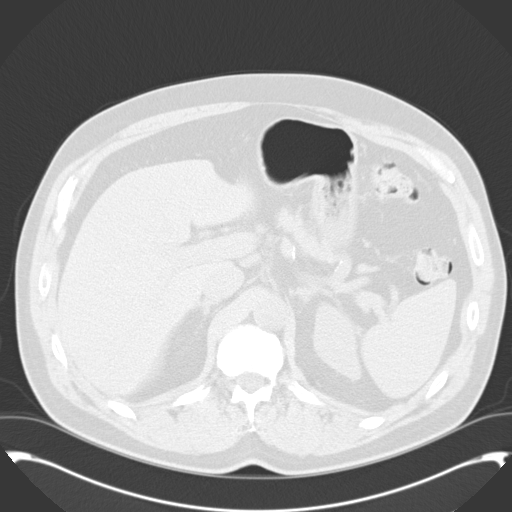
[im 10/65  lung]
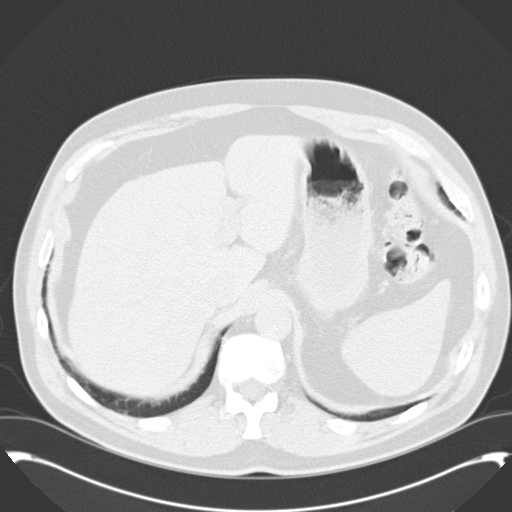
[im 15/65  lung]
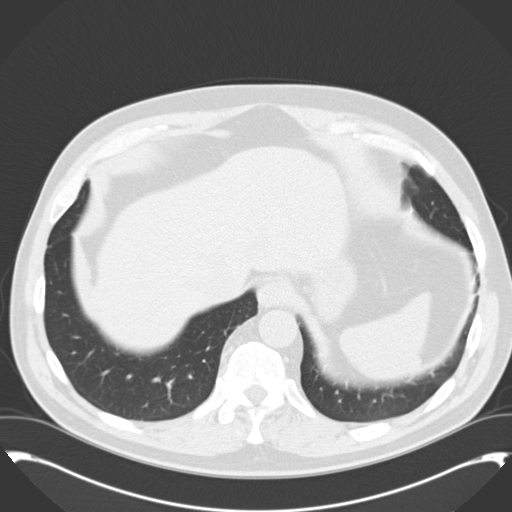
[im 20/65  lung]
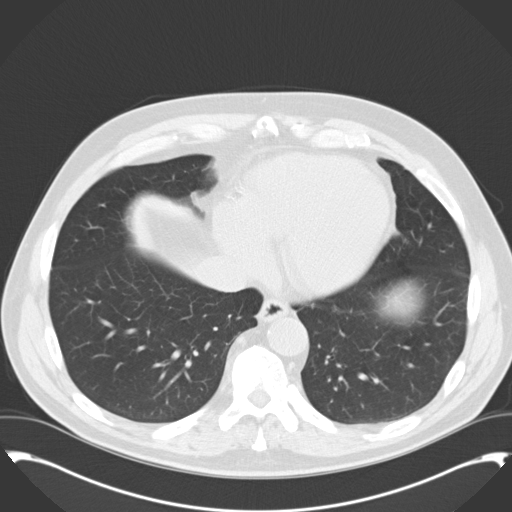
[im 25/65  mediastinal]
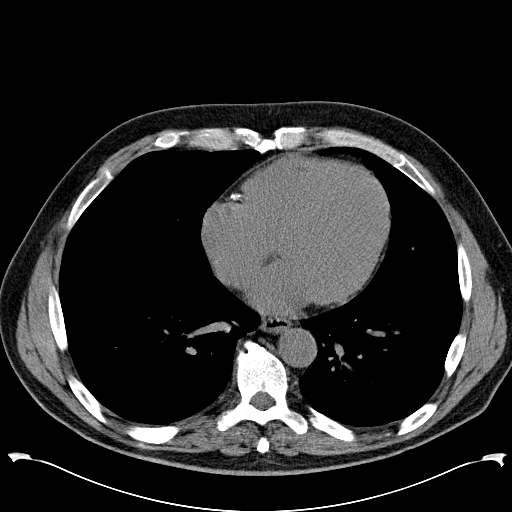
[im 25/65  lung]
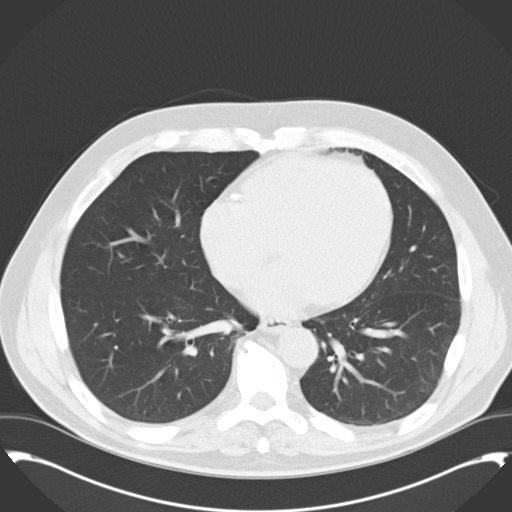
[im 30/65  lung]
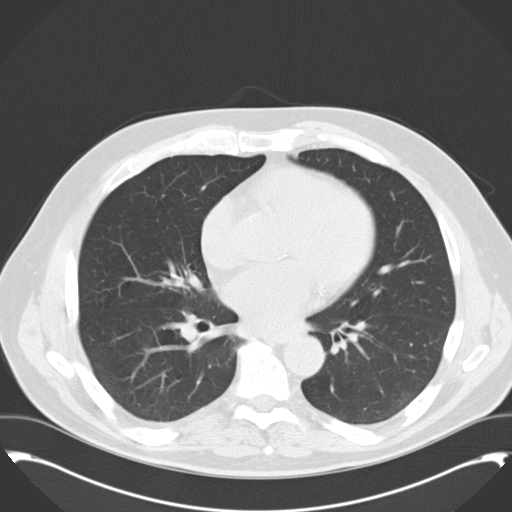
[im 35/65  lung]
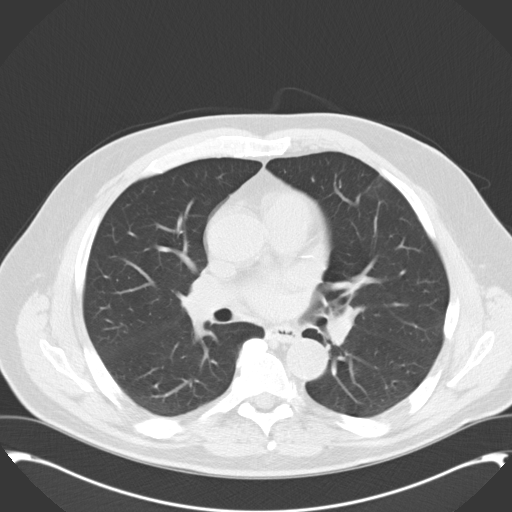
[im 40/65  lung]
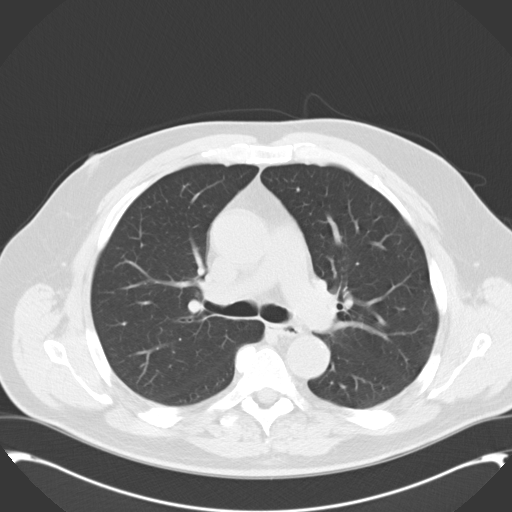
[im 45/65  mediastinal]
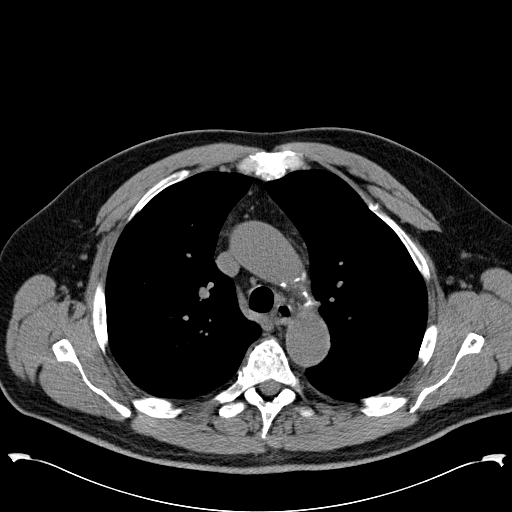
[im 45/65  lung]
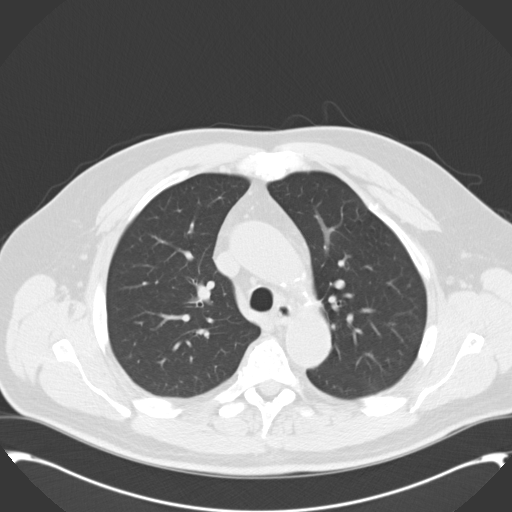
[im 50/65  lung]
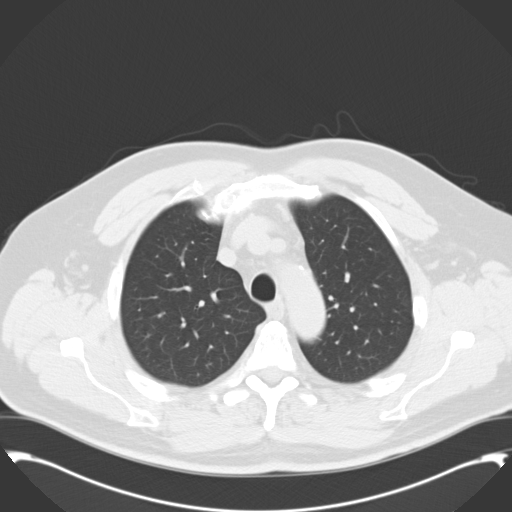
[im 55/65  lung]
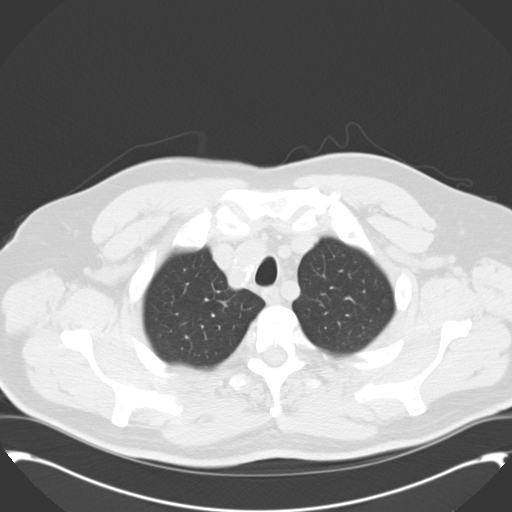
[im 60/65  lung]
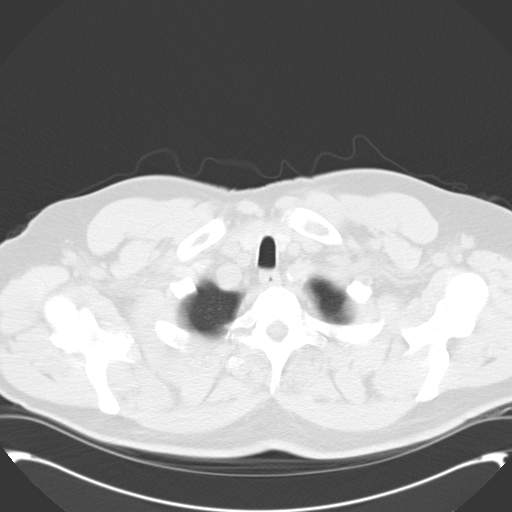

[Series 602: cor · coronal · 0.78mm/px · 3 of 125 slices shown]
[im 25/125  lung]
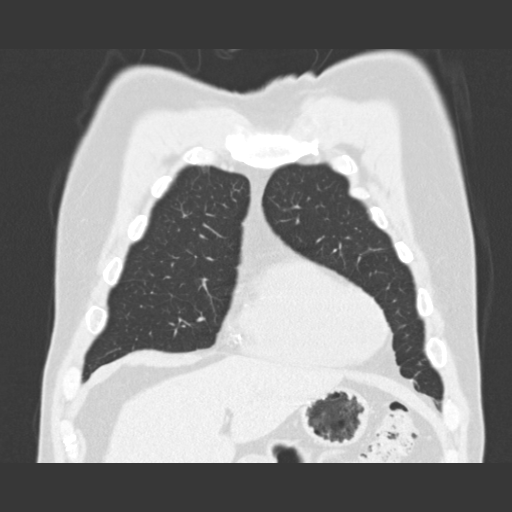
[im 50/125  lung]
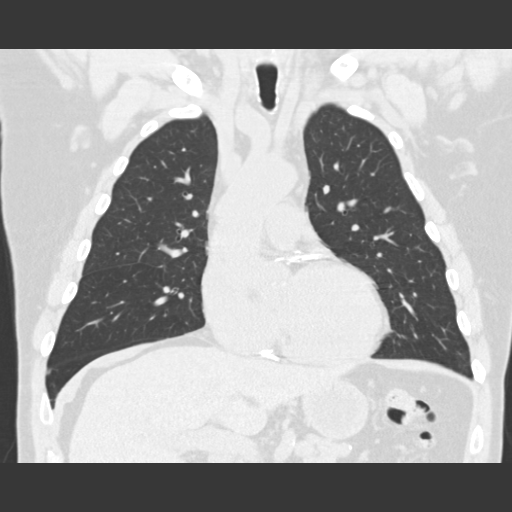
[im 75/125  lung]
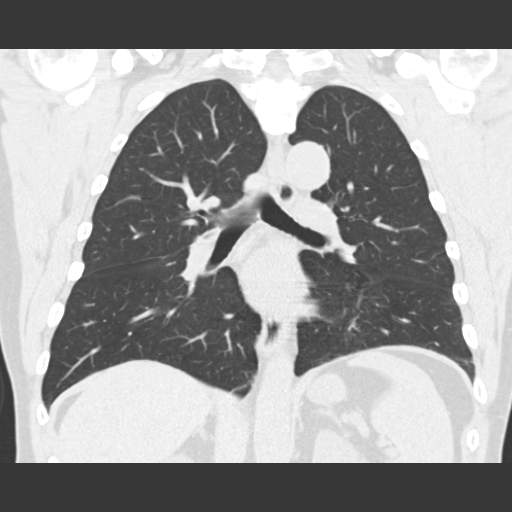

[15 of 36 positions shown; findings below may reference images not displayed]

FINDINGS: The nodular opacity in the central aspect of the right lower lobe
noted on the prior study is appreciable on axial slice 35 series 3.
This opacity measures 1.3 x 1.3 cm on this study, not felt to be
appreciably changed allowing for slight difference in scan plane
between the 2 studies. No new parenchymal lung lesion is identified.
No edema or consolidation.

Subcentimeter mediastinal lymph nodes remain, not meeting size
criteria for pathologic significance. There is no adenopathy on this
study by size criteria.

Thyroid appears normal. No thoracic aortic aneurysm. There is
atherosclerotic change in the aortic arch region.

There is extensive coronary artery calcification, unchanged from 1
month prior. Pericardium is not thickened.

Visualized upper abdominal structures appear unremarkable except for
atherosclerotic change. There are no blastic or lytic bone lesions.
There is degenerative change in the thoracic spine.
IMPRESSION: Stable nodular lesion right lower lobe centrally immediately
adjacent to central pulmonary vessels on the right. No new
parenchymal lung opacity. No adenopathy by size criteria. Widespread
atherosclerotic calcification noted.

Given persistence of this nodular lesion on the right, correlation
with PET-CT may well be advisable.

## 2015-07-18 MED ORDER — LOSARTAN POTASSIUM 100 MG PO TABS: 100.0000 mg | ORAL_TABLET | Freq: Every day | ORAL | Status: DC

## 2015-07-18 NOTE — Patient Instructions (Signed)
Medication Instructions:  Your physician has recommended you make the following change in your medication:  1) INCREASE Losartan to 100mg  daily. An Rx has been sent to your pharmacy 2) STOP Aspirin on 07/20/15   Labwork: Bmet and Cbc today  Testing/Procedures: None ordered  Follow-Up: Follow up as planned with Dr.Turner in October  Any Other Special Instructions Will Be Listed Below (If Applicable).

## 2015-07-18 NOTE — Progress Notes (Signed)
Patient Name: Troy [name] Date of Encounter: 07/18/2015  Primary Care Provider:  No PCP Per Patient Primary Cardiologist:  T. Radford Pax, MD   Chief Complaint  64 year old male status post recent LAD and PDA stenting with subsequent finding of paroxysmal A. fib who presents for follow-up.   Past Medical History   Past Medical History  Diagnosis Date  . Essential hypertension   . Sleep apnea, obstructive   . CAD (coronary artery disease)     a. s/p PCI in 2010 in [place]. b. Unstable angina/LHC 03/11/8412 complicated by V fib arrest after contrast injection. Cath 06/19/2015 s/p DES to mid LAD and RPDA.  Marland Kitchen PAF (paroxysmal atrial fibrillation)     a. 06/2015 noted to be in new a-fib when arrived with unstable angina. Converted to NSR after being shocked in the cath lab for vfib;  b. 06/2015 Eliquis initiated as PAF noted on event monitor.  . Pulmonary nodule   . Ventricular fibrillation     a. occured during cath 06/18/2015.  Marland Kitchen Dyslipidemia   . Pulmonary nodule     a. 1.5 x 1.2 cm smoothly marginated nodule in the central aspect of the right lower lobe with recommendation correlation with nonemergent PET-CT to exclude a neoplasm - f/u pulm planned.  . Mitral regurgitation     a. Mild-mod by echo 06/2015.  . Lung nodule < 6cm on CT 06/16/15    Right lung   Past Surgical History  Procedure Laterality Date  . Replacement total knee Left   . Coronary angioplasty with stent placement    . Cardiac catheterization N/A 06/18/2015    Procedure: Left Heart Cath and Coronary Angiography;  Surgeon: Burnell Blanks, MD;  Location: [facility];  Service: Cardiovascular;  Laterality: N/A;  . Cardiac catheterization N/A 06/19/2015    Procedure: Coronary Stent Intervention;  Surgeon: Peter M Martinique, MD;  Location: [facility];  Service: Cardiovascular;  Laterality: N/A;    Allergies  Allergies  Allergen Reactions  . Gluten Meal Other (See Comments)    REACTION: Celiac disease  .  Tizanidine Anxiety    Depression  . Whey Other (See Comments)    REACTION: Celiac disease  . Morphine And Related Other (See Comments)    REACTION: "REALLY BAD STOMACH PAIN"    HPI  64 year old male with the above complex problem list. He was hospitalized in August with unstable angina and found to have LAD and PDA disease. He was also an atrial fibrillation at the time. During his catheterization and stenting, he developed ventricular fibrillation requiring defibrillation. This resulted in conversion to sinus rhythm. He had no further A. fib during his hospitalization and subsequently followed up in clinic, at which point he was doing well. He was wearing an event monitor and this subsequently showed paroxysmal atrial fibrillation, which was asymptomatic. Following review, he was placed on eliquis therapy. Since then, he has done well without any recurrence of chest pain, dyspnea, or palpitations. He is unaware as to whether or not if he's had anymore A. fib. He is tolerating eliquis well. He denies PND, orthopnea, dizziness, syncope, edema, or early satiety. He has noticed that his blood pressure remains elevated in the 140s to 150s on a regular basis at home.   Home Medications  Prior to Admission medications   Medication Sig Start Date End Date Taking? Authorizing Provider  apixaban (ELIQUIS) 5 MG TABS tablet Take 1 tablet (5 mg total) by mouth 2 (two) times daily. 06/28/15  Yes Dayna N Dunn, PA-C  atorvastatin (LIPITOR) 80 MG tablet Take 1 tablet (80 mg total) by mouth daily at 6 PM. 06/20/15  Yes Almyra Deforest, PA  clopidogrel (PLAVIX) 75 MG tablet Take 1 tablet (75 mg total) by mouth daily with breakfast. 06/20/15  Yes Almyra Deforest, PA  losartan (COZAAR) 100 MG tablet Take 1 tablet (100 mg total) by mouth daily. 07/18/15  Yes Rogelia Mire, NP  metoprolol (LOPRESSOR) 50 MG tablet Take 1.5 tablets (75 mg total) by mouth 2 (two) times daily. 06/28/15  Yes Dayna N Dunn, PA-C  nitroGLYCERIN  (NITROSTAT) 0.4 MG SL tablet Place 1 tablet (0.4 mg total) under the tongue every 5 (five) minutes as needed for chest pain. 06/20/15  Yes Almyra Deforest, PA  Omega-3 Fatty Acids (FISH OIL) 500 MG CAPS Take 2,000 mg by mouth daily.    Yes Historical Provider, MD    Review of Systems  He denies chest pain, palpitations, dyspnea, pnd, orthopnea, n, v, dizziness, syncope, edema, weight gain, or early satiety.   All other systems reviewed and are otherwise negative except as noted above.  Physical Exam  VS:  BP 142/90 mmHg  Pulse 55  Ht 5\' 11"  (1.803 m)  Wt 235 lb 12.8 oz (106.958 kg)  BMI 32.90 kg/m2 , BMI Body mass index is 32.9 kg/(m^2). GEN: Well nourished, well developed, in no acute distress. HEENT: normal. Neck: Supple, no JVD, carotid bruits, or masses. Cardiac: RRR, no murmurs, rubs, or gallops. No clubbing, cyanosis, edema.  Radials/DP/PT 2+ and equal bilaterally.  Respiratory:  Respirations regular and unlabored, clear to auscultation bilaterally. GI: Soft, nontender, nondistended, BS + x 4. MS: no deformity or atrophy. Skin: warm and dry, no rash. Neuro:  Strength and sensation are intact. Psych: Normal affect.  Accessory Clinical Findings  ECG - sinus bradycardia, 55, no acute ST or T changes.   Assessment & Plan  1.  Coronary artery disease: Status post recent LAD and PDA stenting. He is doing well without chest pain or dyspnea. He remains on aspirin, statin, Plavix, beta blocker, and ARB therapy. As eliquis has been added to his regimen in the setting of paroxysmal atrial fibrillation, he has been advised to discontinue aspirin on 9/9.  2. Paroxysmal atrial fibrillation: He is in sinus rhythm today. He is unaware of any recurrence of atrial fibrillation. He remains on beta blocker and eliquis therapy. As above, he will stop aspirin on September 9.  3. Essential hypertension: Blood pressure is elevated today and has been trending in the 140s and 150s at home. I will increase  his losartan to 100 mg daily.  4. Hyperlipidemia: Continue high potency statin therapy.  5. Disposition: Follow-up with Dr. Radford Pax in 2-3 months or sooner as necessary. I will check a CBC and basic metabolic panel today given new start of eliquis.  Murray Hodgkins, NP 07/18/2015, 1:40 PM

## 2015-07-20 ENCOUNTER — Telehealth: Payer: Self-pay | Admitting: Pulmonary Disease

## 2015-07-20 DIAGNOSIS — R911 Solitary pulmonary nodule: Secondary | ICD-10-CM

## 2015-07-20 NOTE — Telephone Encounter (Signed)
Spoke with the patient regarding persistence of 1.3 cm right lower lobe nodule. Plan for PET/CT imaging to better assess this nodule for hypermetabolic activity.

## 2015-07-20 NOTE — Telephone Encounter (Signed)
Called and spoke to pt. Pt states he is returning Dr. Ammie Dalton call about results. Pt stated the best number he can be reached at is [phone] anytime between now and 2100.   Dr. Ashok Cordia please advise. Thanks.

## 2015-07-23 ENCOUNTER — Telehealth: Payer: Self-pay | Admitting: Pulmonary Disease

## 2015-07-23 DIAGNOSIS — R911 Solitary pulmonary nodule: Secondary | ICD-10-CM

## 2015-07-23 NOTE — Telephone Encounter (Signed)
Per Dr. Ammie Dalton TE note on 9/9.  He spoke with patient and gave patient results. Needs PET scan - per Dr. Ammie Dalton note.   Order entered for PET Scan. Nothing further needed.

## 2015-07-23 NOTE — Telephone Encounter (Signed)
Needs to contact his Cardiologist to adjust his medications. JN

## 2015-07-23 NOTE — Telephone Encounter (Signed)
Patient calling because his blood pressure 160/90; it has been a little above and little below.  He is currently taking Plavix, Losartan, Metoprolol.   [place] out town early in the morning.  Needs done today.  Allergies  Allergen Reactions  . Gluten Meal Other (See Comments)    REACTION: Celiac disease  . Tizanidine Anxiety    Depression  . Whey Other (See Comments)    REACTION: Celiac disease  . Morphine And Related Other (See Comments)    REACTION: "REALLY BAD STOMACH PAIN"

## 2015-07-23 NOTE — Telephone Encounter (Signed)
Patient notified.  Nothing further needed. 

## 2015-07-25 ENCOUNTER — Encounter: Payer: Self-pay | Admitting: Cardiology

## 2015-07-25 NOTE — Telephone Encounter (Signed)
F/U       Pt calling stating that he is returning [place] phone call.

## 2015-07-25 NOTE — Telephone Encounter (Signed)
This encounter was created in error - please disregard.

## 2015-07-26 ENCOUNTER — Telehealth: Payer: Self-pay

## 2015-07-26 MED ORDER — METOPROLOL TARTRATE 100 MG PO TABS: 100.0000 mg | ORAL_TABLET | Freq: Two times a day (BID) | ORAL | Status: DC

## 2015-07-26 NOTE — Telephone Encounter (Signed)
Notes Recorded by Sueanne Margarita, MD on 07/26/2015 at 11:45 AM Increase lopressor to 100mg  BID for better BP control  Instructed patient to INCREASE LOPRESSOR to 100 mg BID for better BP control. Patient understands to call if his BP gets low or he starts having symptoms. Refill sent to pharmacy of choice. Patient agrees with treatment plan.

## 2015-07-26 NOTE — Telephone Encounter (Signed)
-----   Message from Sueanne Margarita, MD sent at 07/25/2015 12:34 PM EDT ----- Heart monitor showed PAF with none since 8/19 - please find out if he has had any further palpitations

## 2015-07-31 ENCOUNTER — Ambulatory Visit (HOSPITAL_COMMUNITY): Payer: Medicare Other

## 2015-08-01 ENCOUNTER — Encounter (HOSPITAL_COMMUNITY): Payer: Medicare Other

## 2015-08-02 ENCOUNTER — Ambulatory Visit: Payer: Medicare Other | Admitting: Pulmonary Disease

## 2015-08-02 DIAGNOSIS — J019 Acute sinusitis, unspecified: Secondary | ICD-10-CM | POA: Diagnosis not present

## 2015-08-02 DIAGNOSIS — Z6832 Body mass index (BMI) 32.0-32.9, adult: Secondary | ICD-10-CM | POA: Diagnosis not present

## 2015-08-02 DIAGNOSIS — Z23 Encounter for immunization: Secondary | ICD-10-CM | POA: Diagnosis not present

## 2015-08-02 DIAGNOSIS — R05 Cough: Secondary | ICD-10-CM | POA: Diagnosis not present

## 2015-08-03 ENCOUNTER — Other Ambulatory Visit (INDEPENDENT_AMBULATORY_CARE_PROVIDER_SITE_OTHER): Payer: Medicare Other | Admitting: *Deleted

## 2015-08-03 DIAGNOSIS — I10 Essential (primary) hypertension: Secondary | ICD-10-CM | POA: Diagnosis not present

## 2015-08-03 LAB — CBC
HCT: 43.2 % (ref 39.0–52.0)
HEMOGLOBIN: 14.6 g/dL (ref 13.0–17.0)
MCHC: 33.8 g/dL (ref 30.0–36.0)
MCV: 87 fl (ref 78.0–100.0)
Platelets: 144 10*3/uL — ABNORMAL LOW (ref 150.0–400.0)
RBC: 4.97 Mil/uL (ref 4.22–5.81)
RDW: 14.4 % (ref 11.5–15.5)
WBC: 5 10*3/uL (ref 4.0–10.5)

## 2015-08-07 ENCOUNTER — Ambulatory Visit (HOSPITAL_COMMUNITY)
Admission: RE | Admit: 2015-08-07 | Discharge: 2015-08-07 | Disposition: A | Discharge status: Home / Self Care | Payer: Medicare Other | Source: Ambulatory Visit | Attending: Pulmonary Disease | Admitting: Pulmonary Disease

## 2015-08-07 DIAGNOSIS — R911 Solitary pulmonary nodule: Secondary | ICD-10-CM | POA: Diagnosis not present

## 2015-08-07 DIAGNOSIS — Z79899 Other long term (current) drug therapy: Secondary | ICD-10-CM | POA: Diagnosis not present

## 2015-08-07 LAB — GLUCOSE, CAPILLARY: GLUCOSE-CAPILLARY: 101 mg/dL — AB (ref 65–99)

## 2015-08-07 IMAGING — PT NM PET TUM IMG INITIAL (PI) SKULL BASE T - THIGH
8 series · 25 of 25 positions shown · non-contrast
Comparison: Chest CTs on [DATE] and [DATE]

CLINICAL DATA: Initial treatment strategy for right lung nodule.

EXAM:
NUCLEAR MEDICINE PET SKULL BASE TO THIGH
TECHNIQUE: 11.8 mCi F-18 FDG was injected intravenously. Full-ring PET imaging
was performed from the skull base to thigh after the radiotracer. CT
data was obtained and used for attenuation correction and anatomic
localization.
FASTING BLOOD GLUCOSE:  Value: 101 mg/dl

[Series 3: pet sk_thigh ac · axial · 5.0mm · 4.07mm/px · z∈[-876,+56]mm · 4 of 234 slices shown]
[im 1/234]
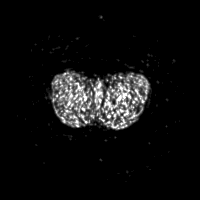
[im 78/234]
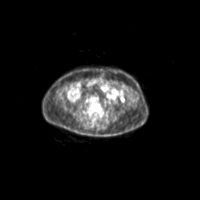
[im 156/234]
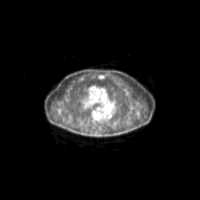
[im 234/234]
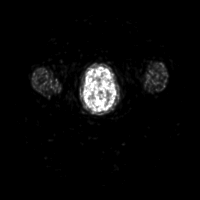

[Series 4: ct sk_thigh 5.0 b31f · axial · 5.0mm · 0.98mm/px · z∈[-876,+56]mm · 5 of 234 slices shown]
[im 1/234]
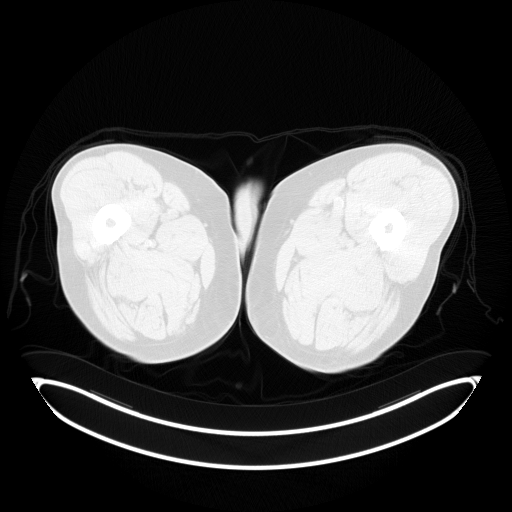
[im 59/234]
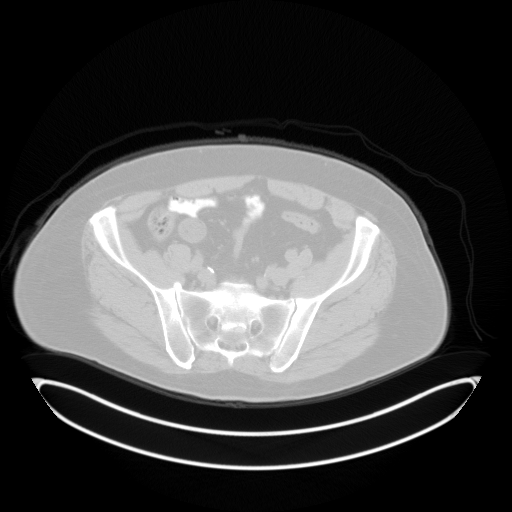
[im 117/234]
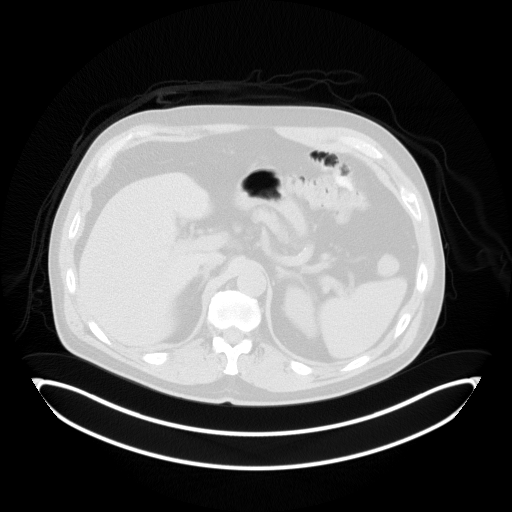
[im 175/234]
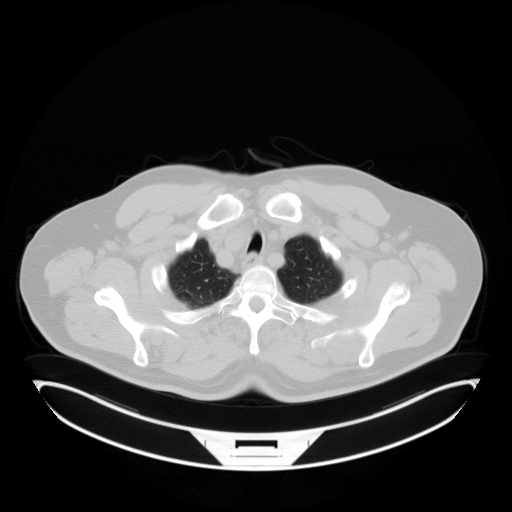
[im 234/234  brain]
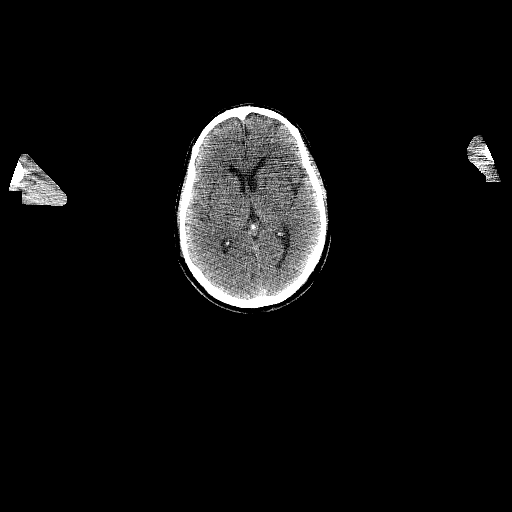

[Series 6: ct sk_thigh 5.0 b70f lung_(id) · axial · 5.0mm · 0.81mm/px · z∈[-424,-132]mm · 2 of 74 slices shown]
[im 1/74  bone]
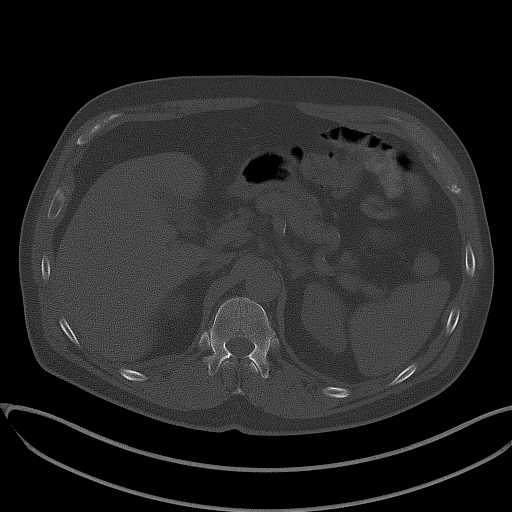
[im 74/74  bone]
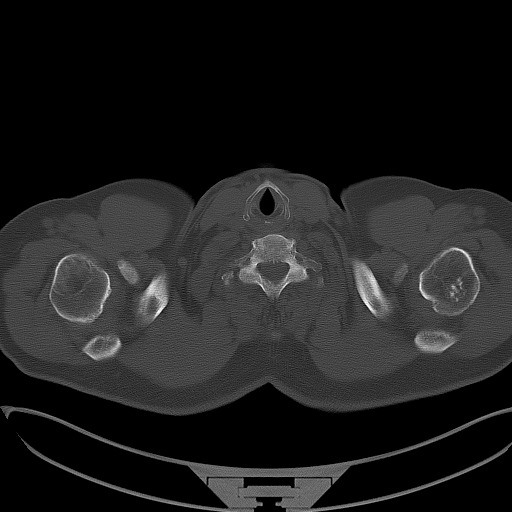

[Series 8: pet sk_thigh nac · axial · 5.0mm · 4.07mm/px · z∈[-876,+56]mm · 5 of 234 slices shown]
[im 1/234]
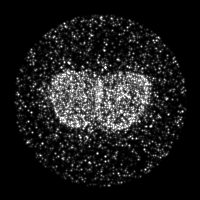
[im 59/234]
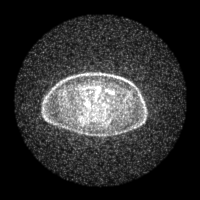
[im 117/234]
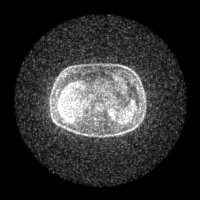
[im 175/234]
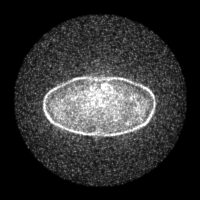
[im 234/234]
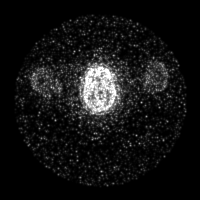

[Series 604: mip collection<mip range> · coronal · 1.93mm/px · 1 of 32 slices shown]
[im 1/32]
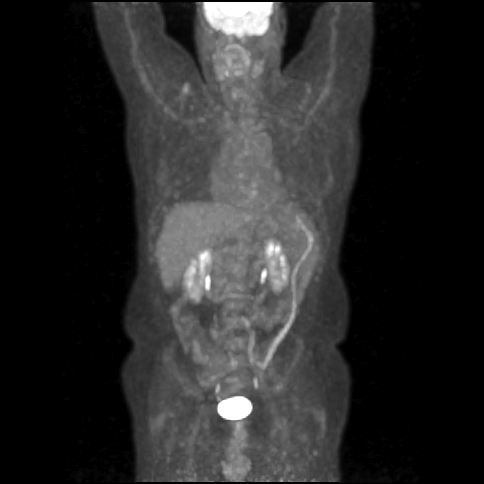

[Series 605: range-ct sk_thigh 5.0 (id)<alpha range> · 2 of 93 slices shown (1 of 2)]
[im 1/93]
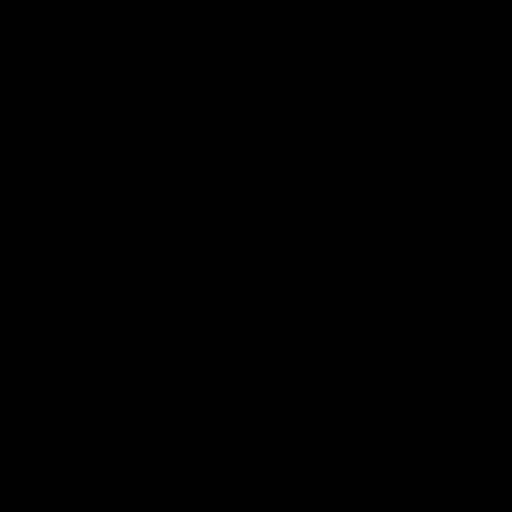
[im 93/93]
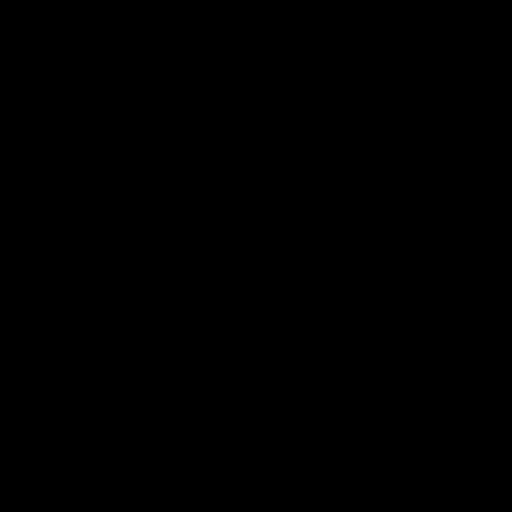

[Series 606: range-ct sk_thigh 5.0 (id)<alpha range> · 5 of 220 slices shown (2 of 2)]
[im 1/220]
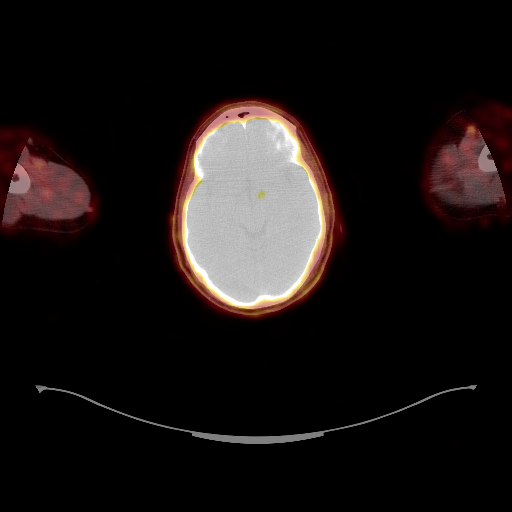
[im 55/220]
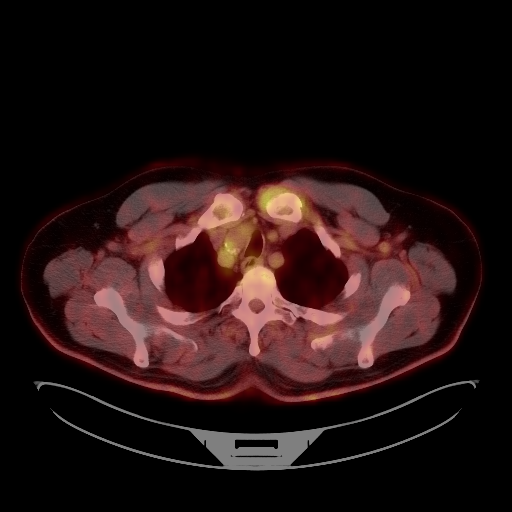
[im 110/220]
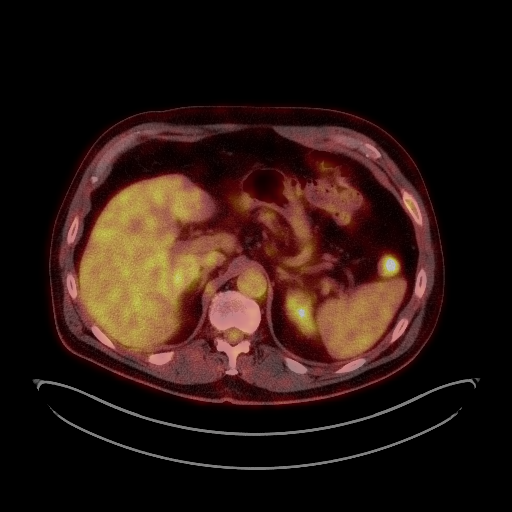
[im 165/220]
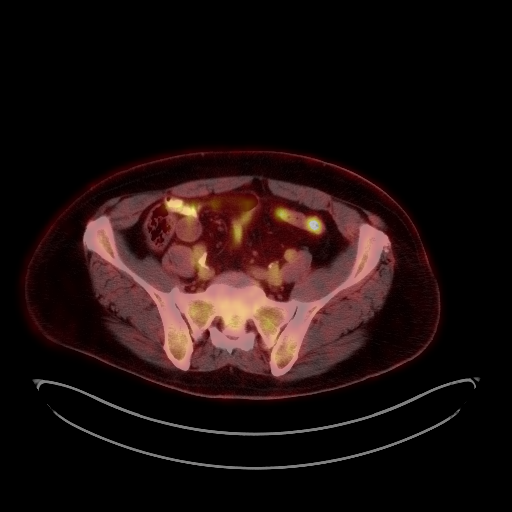
[im 220/220]
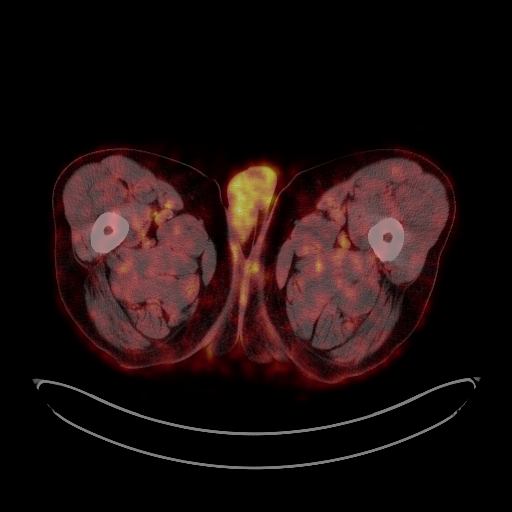

[Series 1038: results mm oncology reading · 1.02mm/px · 1 of 1 slices shown]
[im 1/1]
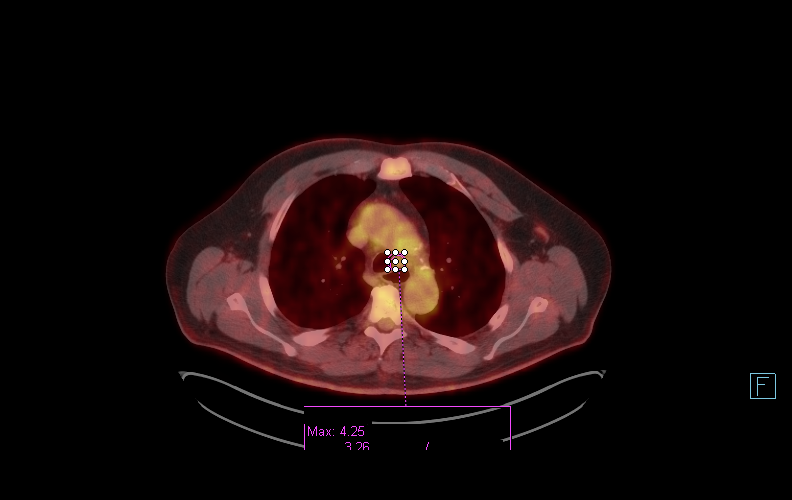

[25 of 25 positions shown; findings below may reference images not displayed]

FINDINGS: NECK

No hypermetabolic lymph nodes in the neck.

CHEST

8 mm mediastinal lymph node is seen in the AP window which shows
mild hypermetabolic activity with SUV max of 4.3. No pathologically
enlarged lymph nodes identified.

1.2 cm right perihilar nodule or lymph node on image 42 of series 6
shows no hypermetabolic activity above background hilar metabolic
activity, suggesting a benign etiology.

ABDOMEN/PELVIS

No abnormal hypermetabolic activity within the liver, pancreas,
adrenal glands, or spleen. No hypermetabolic lymph nodes in the
abdomen or pelvis.

SKELETON

No focal hypermetabolic activity to suggest skeletal metastasis.
IMPRESSION: 1.2 cm right perihilar nodule or lymph node shows no hypermetabolic
activity above background, suggesting a benign etiology. Continued
followup by chest CT with contrast recommended in 6 months.

8 mm mildly hypermetabolic mediastinal lymph node in AP window,
which is nonspecific and may be reactive in etiology. Continued
attention on follow-up CT also recommended.

## 2015-08-07 MED ORDER — FLUDEOXYGLUCOSE F - 18 (FDG) INJECTION
11.7600 | Freq: Once | INTRAVENOUS | Status: DC | PRN
  Administered 2015-08-07: 11.76 via INTRAVENOUS
  Filled 2015-08-07: qty 11.76

## 2015-08-14 ENCOUNTER — Telehealth: Payer: Self-pay | Admitting: Pulmonary Disease

## 2015-08-14 ENCOUNTER — Other Ambulatory Visit: Payer: Self-pay | Admitting: Pulmonary Disease

## 2015-08-14 ENCOUNTER — Other Ambulatory Visit: Payer: Medicare Other

## 2015-08-14 DIAGNOSIS — R911 Solitary pulmonary nodule: Secondary | ICD-10-CM

## 2015-08-14 NOTE — Telephone Encounter (Signed)
Spoke with the patient regarding results of PET/CT imaging. Right lung nodule is not metabolically active. Left peritracheal nodule which is subcentimeter is likely reactive from the patient's recent upper respiratory illness which both he and his girlfriend have been recovering from for the last 3 weeks. Plan to repeat chest CT scan without contrast in 3 months. Patient's appointment will be rescheduled for immediately following his CT scan. I instructed him to contact my office for any further questions or concerns.

## 2015-08-15 ENCOUNTER — Other Ambulatory Visit (INDEPENDENT_AMBULATORY_CARE_PROVIDER_SITE_OTHER): Payer: Medicare Other | Admitting: *Deleted

## 2015-08-15 DIAGNOSIS — I10 Essential (primary) hypertension: Secondary | ICD-10-CM | POA: Diagnosis not present

## 2015-08-15 LAB — LIPID PANEL
CHOL/HDL RATIO: 2.5 ratio (ref <=5.0)
CHOLESTEROL: 94 mg/dL — AB (ref 125–200)
HDL: 37 mg/dL — AB (ref >=40)
LDL Cholesterol: 42 mg/dL (ref <130)
TRIGLYCERIDES: 74 mg/dL (ref <150)
VLDL: 15 mg/dL (ref <30)

## 2015-08-15 LAB — HEPATIC FUNCTION PANEL
ALT: 44 U/L (ref 9–46)
AST: 28 U/L (ref 10–35)
Albumin: 4.2 g/dL (ref 3.6–5.1)
Alkaline Phosphatase: 43 U/L (ref 40–115)
Bilirubin, Direct: 0.1 mg/dL (ref <=0.2)
Indirect Bilirubin: 0.4 mg/dL (ref 0.2–1.2)
TOTAL PROTEIN: 6.5 g/dL (ref 6.1–8.1)
Total Bilirubin: 0.5 mg/dL (ref 0.2–1.2)

## 2015-08-16 ENCOUNTER — Ambulatory Visit: Payer: Medicare Other | Admitting: Cardiology

## 2015-08-21 ENCOUNTER — Ambulatory Visit: Payer: Medicare Other | Admitting: Pulmonary Disease

## 2015-08-27 ENCOUNTER — Telehealth: Payer: Self-pay | Admitting: Cardiology

## 2015-08-27 NOTE — Telephone Encounter (Signed)
Walk in pt form-questions about Cardiac Rehab-[place] back Tuesday 10/19.

## 2015-08-28 NOTE — Progress Notes (Signed)
Cardiology Office Note   Date:  08/29/2015   ID:  Troy [name], DOB July 10, 1951, MRN [id number]  PCP:  Velna Hatchet, MD    Chief Complaint  Patient presents with  . Coronary Artery Disease  . Atrial Fibrillation  . Hypertension      History of Present Illness: 64 year old male with the above complex problem list. He was hospitalized in August with unstable angina and found to have LAD and PDA disease. He was also an atrial fibrillation at the time. During his catheterization and stenting, he developed ventricular fibrillation requiring defibrillation. This resulted in conversion to sinus rhythm. He had no further A. fib during his hospitalization and subsequently followed up in clinic, at which point he was doing well. He was wearing an event monitor and this subsequently showed paroxysmal atrial fibrillation, which was asymptomatic. Following review, he was placed on eliquis therapy. Since then, he has done well without any recurrence of chest pain, dyspnea, or palpitations. He is unaware as to whether or not if he's had anymore A. fib. He is tolerating eliquis well. He denies PND, orthopnea, dizziness, syncope, edema, or early satiety.      Past Medical History  Diagnosis Date  . Essential hypertension   . Sleep apnea, obstructive   . CAD (coronary artery disease)     a. s/p PCI in 2010 in [place]. b. Unstable angina/LHC 01/15/9023 complicated by V fib arrest after contrast injection. Cath 06/19/2015 s/p DES to mid LAD and RPDA.  Marland Kitchen PAF (paroxysmal atrial fibrillation)     a. 06/2015 noted to be in new a-fib when arrived with unstable angina. Converted to NSR after being shocked in the cath lab for vfib;  b. 06/2015 Eliquis initiated as PAF noted on event monitor.  . Pulmonary nodule   . Ventricular fibrillation     a. occured during cath 06/18/2015.  Marland Kitchen Dyslipidemia   . Pulmonary nodule     a. 1.5 x 1.2 cm smoothly marginated nodule in the central aspect of  the right lower lobe with recommendation correlation with nonemergent PET-CT to exclude a neoplasm - f/u pulm planned.  . Mitral regurgitation     a. Mild-mod by echo 06/2015.  . Lung nodule < 6cm on CT 06/16/15    Right lung    Past Surgical History  Procedure Laterality Date  . Replacement total knee Left   . Coronary angioplasty with stent placement    . Cardiac catheterization N/A 06/18/2015    Procedure: Left Heart Cath and Coronary Angiography;  Surgeon: Burnell Blanks, MD;  Location: [place];  Service: Cardiovascular;  Laterality: N/A;  . Cardiac catheterization N/A 06/19/2015    Procedure: Coronary Stent Intervention;  Surgeon: Peter M Martinique, MD;  Location: [facility];  Service: Cardiovascular;  Laterality: N/A;     Current Outpatient Prescriptions  Medication Sig Dispense Refill  . apixaban (ELIQUIS) 5 MG TABS tablet Take 1 tablet (5 mg total) by mouth 2 (two) times daily. 60 tablet 3  . atorvastatin (LIPITOR) 80 MG tablet Take 1 tablet (80 mg total) by mouth daily at 6 PM. 90 tablet 3  . clopidogrel (PLAVIX) 75 MG tablet Take 1 tablet (75 mg total) by mouth daily with breakfast. 90 tablet 3  . losartan (COZAAR) 100 MG tablet Take 1 tablet (100 mg total) by mouth daily. 30 tablet 5  . metoprolol (LOPRESSOR) 100 MG  tablet Take 1 tablet (100 mg total) by mouth 2 (two) times daily. 180 tablet 3  . nitroGLYCERIN (NITROSTAT) 0.4 MG SL tablet Place 1 tablet (0.4 mg total) under the tongue every 5 (five) minutes as needed for chest pain. 25 tablet 3  . Omega-3 Fatty Acids (FISH OIL) 500 MG CAPS Take 2,000 mg by mouth daily.      No current facility-administered medications for this visit.    Allergies:   Gluten meal; Tizanidine; Whey; and Morphine and related    Social History:  The patient  reports that he has never smoked. He has never used smokeless tobacco. He reports that he drinks alcohol. He reports that he does not use illicit drugs.   Family  History:  The patient's family history includes Diabetes in his sister; Drug abuse in his father; Emphysema in his mother; Heart attack in his father, maternal grandfather, and paternal grandfather; Lung cancer in his mother; Stroke in his mother.    ROS:  Please see the history of present illness.   Otherwise, review of systems are positive for none.   All other systems are reviewed and negative.    PHYSICAL EXAM: VS:  BP 128/88 mmHg  Pulse 62  Ht 5\' 11"  (1.803 m)  Wt 234 lb (106.142 kg)  BMI 32.65 kg/m2  SpO2 99% , BMI Body mass index is 32.65 kg/(m^2). GEN: Well nourished, well developed, in no acute distress HEENT: normal Neck: no JVD, carotid bruits, or masses Cardiac: RRR; no murmurs, rubs, or gallops,no edema  Respiratory:  clear to auscultation bilaterally, normal work of breathing GI: soft, nontender, nondistended, + BS MS: no deformity or atrophy Skin: warm and dry, no rash Neuro:  Strength and sensation are intact Psych: euthymic mood, full affect   EKG:  EKG is not ordered today.    Recent Labs: 06/16/2015: B Natriuretic Peptide 314.3*; TSH 0.899 07/18/2015: BUN 21; Creatinine, Ser 0.85; Potassium 4.0; Sodium 142 08/03/2015: Hemoglobin 14.6; Platelets 144.0* 08/15/2015: ALT 44    Lipid Panel    Component Value Date/Time   CHOL 94* 08/15/2015 0912   TRIG 74 08/15/2015 0912   HDL 37* 08/15/2015 0912   CHOLHDL 2.5 08/15/2015 0912   VLDL 15 08/15/2015 0912   LDLCALC 42 08/15/2015 0912      Wt Readings from Last 3 Encounters:  08/29/15 234 lb (106.142 kg)  07/18/15 235 lb 12.8 oz (106.958 kg)  07/06/15 235 lb (106.595 kg)    Assessment & Plan  1. Coronary artery disease: Status post  LAD and PDA stenting. He is doing well without chest pain or dyspnea. He remains on  statin, Plavix, beta blocker, and ARB therapy. He is not on ASA as eliquis has been added to his regimen in the setting of paroxysmal atrial fibrillation. 2. Paroxysmal atrial fibrillation: He is  in sinus rhythm today. He is unaware of any recurrence of atrial fibrillation. He remains on beta blocker and eliquis therapy.  3. Essential hypertension: Controlled on BB and ARB 4. Hyperlipidemia: Continue high potency statin therapy.  LDL at goal     Current medicines are reviewed at length with the patient today.  The patient does not have concerns regarding medicines.  The following changes have been made:  no change  Labs/ tests ordered today: See above Assessment and Plan No orders of the defined types were placed in this encounter.     Disposition:   FU with me in 6 months  Signed, Sueanne Margarita, MD  08/29/2015  8:29 AM    [facility] [place], [place], [place]  [address] Phone: [phone]; Fax: [phone]

## 2015-08-29 ENCOUNTER — Ambulatory Visit: Payer: Medicare Other | Admitting: Pulmonary Disease

## 2015-08-29 ENCOUNTER — Ambulatory Visit (INDEPENDENT_AMBULATORY_CARE_PROVIDER_SITE_OTHER): Payer: Medicare Other | Admitting: Cardiology

## 2015-08-29 VITALS — BP 128/88 | HR 62 | Ht 71.0 in | Wt 234.0 lb

## 2015-08-29 DIAGNOSIS — Z9861 Coronary angioplasty status: Secondary | ICD-10-CM

## 2015-08-29 DIAGNOSIS — I10 Essential (primary) hypertension: Secondary | ICD-10-CM | POA: Diagnosis not present

## 2015-08-29 DIAGNOSIS — I480 Paroxysmal atrial fibrillation: Secondary | ICD-10-CM

## 2015-08-29 DIAGNOSIS — I2510 Atherosclerotic heart disease of native coronary artery without angina pectoris: Secondary | ICD-10-CM | POA: Diagnosis not present

## 2015-08-29 DIAGNOSIS — E785 Hyperlipidemia, unspecified: Secondary | ICD-10-CM

## 2015-08-29 NOTE — Patient Instructions (Signed)

## 2015-09-04 ENCOUNTER — Telehealth (HOSPITAL_COMMUNITY): Payer: Self-pay | Admitting: *Deleted

## 2015-09-07 DIAGNOSIS — H35372 Puckering of macula, left eye: Secondary | ICD-10-CM | POA: Diagnosis not present

## 2015-09-07 DIAGNOSIS — H1789 Other corneal scars and opacities: Secondary | ICD-10-CM | POA: Diagnosis not present

## 2015-09-07 DIAGNOSIS — H31002 Unspecified chorioretinal scars, left eye: Secondary | ICD-10-CM | POA: Diagnosis not present

## 2015-09-07 DIAGNOSIS — H52203 Unspecified astigmatism, bilateral: Secondary | ICD-10-CM | POA: Diagnosis not present

## 2015-09-13 DIAGNOSIS — H43811 Vitreous degeneration, right eye: Secondary | ICD-10-CM | POA: Diagnosis not present

## 2015-09-13 DIAGNOSIS — H35372 Puckering of macula, left eye: Secondary | ICD-10-CM | POA: Diagnosis not present

## 2015-09-27 DIAGNOSIS — Z125 Encounter for screening for malignant neoplasm of prostate: Secondary | ICD-10-CM | POA: Diagnosis not present

## 2015-09-27 DIAGNOSIS — I10 Essential (primary) hypertension: Secondary | ICD-10-CM | POA: Diagnosis not present

## 2015-09-27 DIAGNOSIS — E784 Other hyperlipidemia: Secondary | ICD-10-CM | POA: Diagnosis not present

## 2015-10-01 DIAGNOSIS — Z6832 Body mass index (BMI) 32.0-32.9, adult: Secondary | ICD-10-CM | POA: Diagnosis not present

## 2015-10-01 DIAGNOSIS — F419 Anxiety disorder, unspecified: Secondary | ICD-10-CM | POA: Diagnosis not present

## 2015-10-01 DIAGNOSIS — Z0000 Encounter for general adult medical examination without abnormal findings: Secondary | ICD-10-CM | POA: Diagnosis not present

## 2015-10-01 DIAGNOSIS — I10 Essential (primary) hypertension: Secondary | ICD-10-CM | POA: Diagnosis not present

## 2015-10-01 DIAGNOSIS — I480 Paroxysmal atrial fibrillation: Secondary | ICD-10-CM | POA: Diagnosis not present

## 2015-10-01 DIAGNOSIS — I25118 Atherosclerotic heart disease of native coronary artery with other forms of angina pectoris: Secondary | ICD-10-CM | POA: Diagnosis not present

## 2015-10-01 DIAGNOSIS — R918 Other nonspecific abnormal finding of lung field: Secondary | ICD-10-CM | POA: Diagnosis not present

## 2015-10-01 DIAGNOSIS — E784 Other hyperlipidemia: Secondary | ICD-10-CM | POA: Diagnosis not present

## 2015-11-15 ENCOUNTER — Ambulatory Visit (INDEPENDENT_AMBULATORY_CARE_PROVIDER_SITE_OTHER)
Admission: RE | Admit: 2015-11-15 | Discharge: 2015-11-15 | Disposition: A | Discharge status: Home / Self Care | Payer: Medicare Other | Source: Ambulatory Visit | Attending: Pulmonary Disease | Admitting: Pulmonary Disease

## 2015-11-15 DIAGNOSIS — R911 Solitary pulmonary nodule: Secondary | ICD-10-CM | POA: Diagnosis not present

## 2015-11-15 IMAGING — CT CT CHEST W/O CM
2 of 3 series · 15 of 36 positions shown, 18 images · non-contrast
Comparison: PET-CT [DATE] and chest CT [DATE] as well as
[DATE]

CLINICAL DATA: Routine follow-up pulmonary nodule.

EXAM:
CT CHEST WITHOUT CONTRAST
TECHNIQUE: Multidetector CT imaging of the chest was performed following the
standard protocol without IV contrast.

[Series 2: thorax · axial · 0.88mm/px · z∈[-407,-127]mm · 12 of 66 slices shown, 15 images]
[im 5/66  mediastinal]
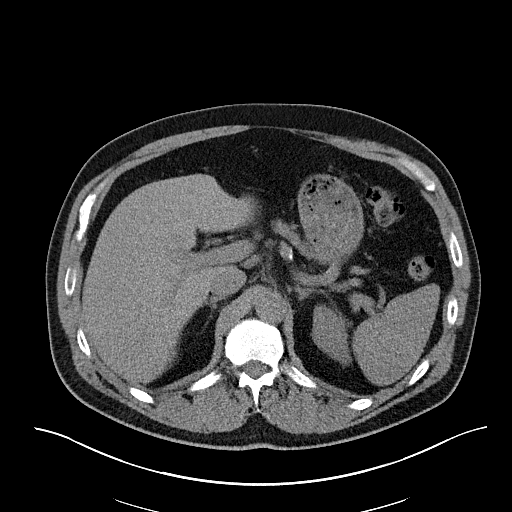
[im 5/66  lung]
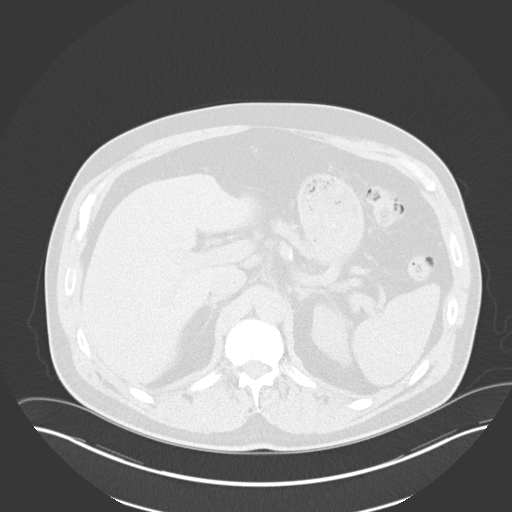
[im 10/66  lung]
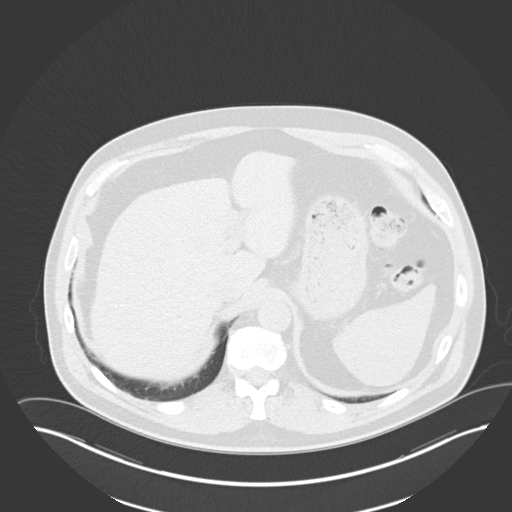
[im 15/66  lung]
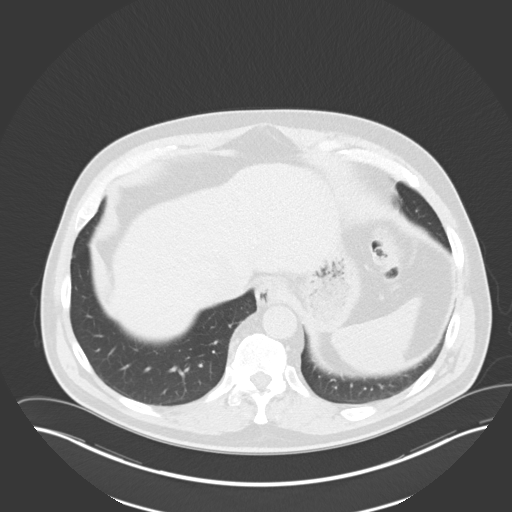
[im 20/66  lung]
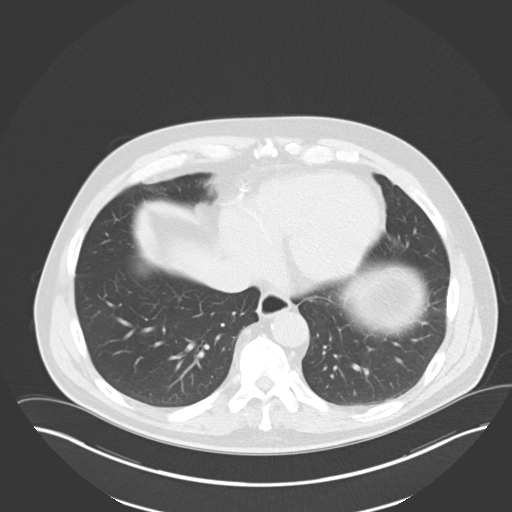
[im 25/66  mediastinal]
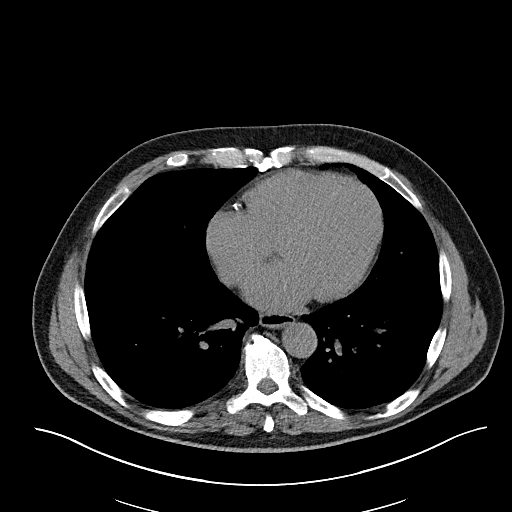
[im 25/66  lung]
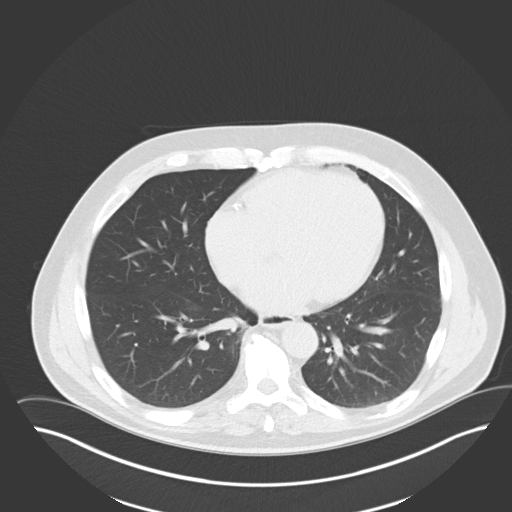
[im 29/66  lung]
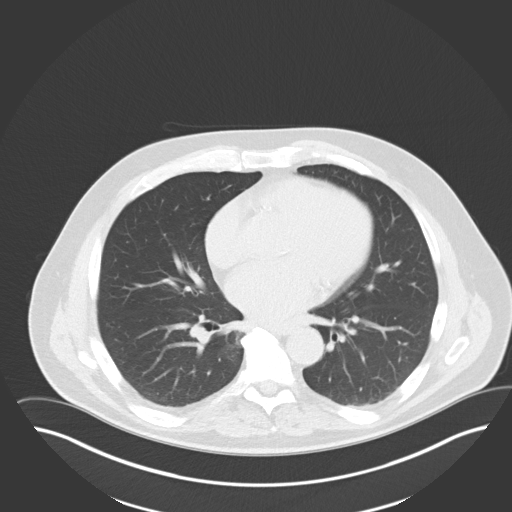
[im 37/66  lung]
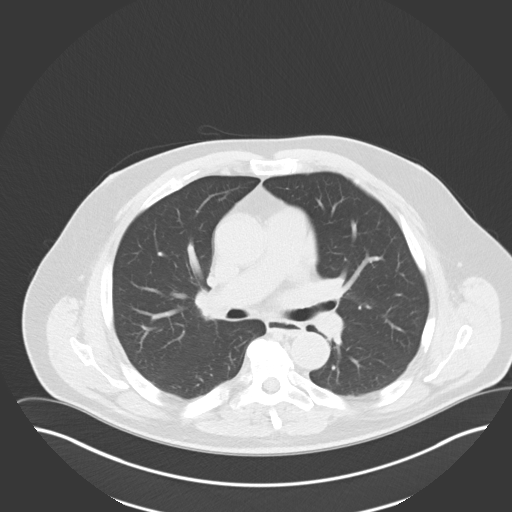
[im 41/66  lung]
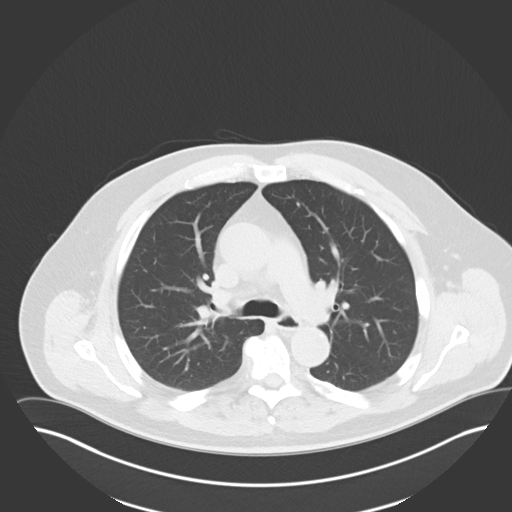
[im 46/66  mediastinal]
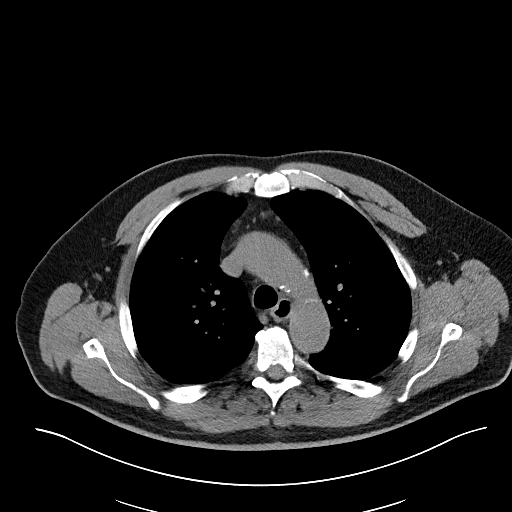
[im 46/66  lung]
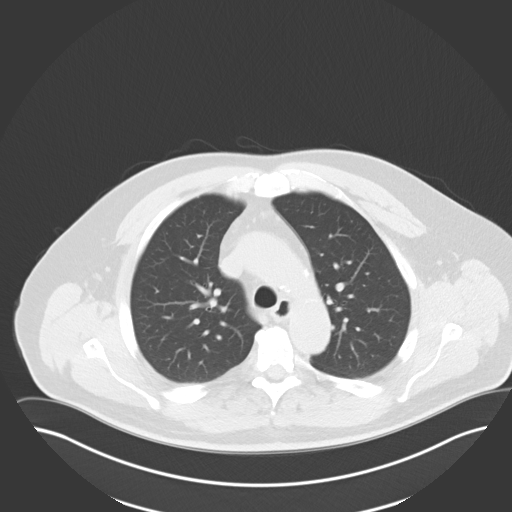
[im 51/66  lung]
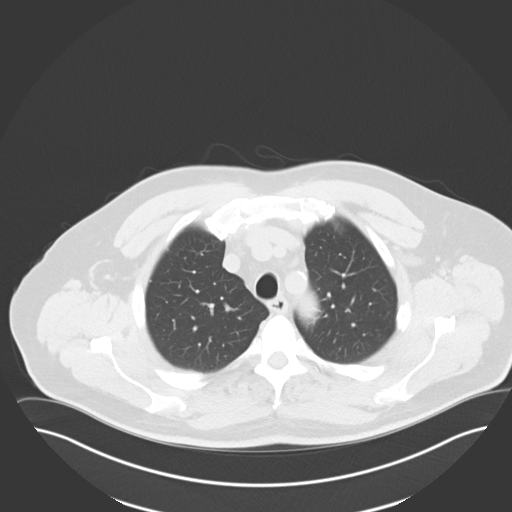
[im 56/66  lung]
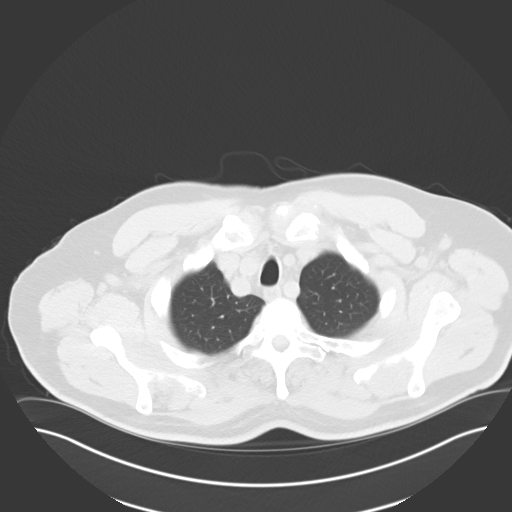
[im 61/66  lung]
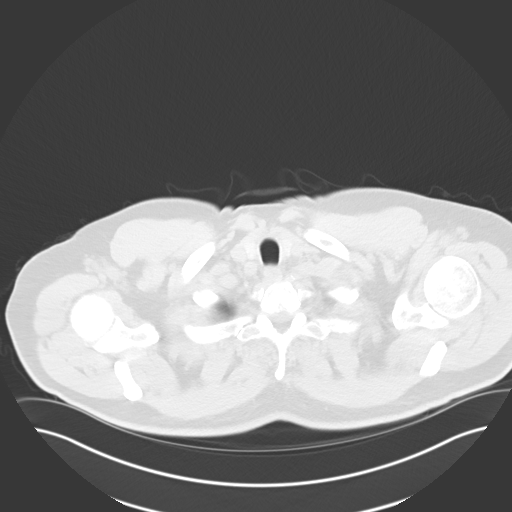

[Series 5: coronal · coronal · 0.62mm/px · 3 of 137 slices shown]
[im 28/137  lung]
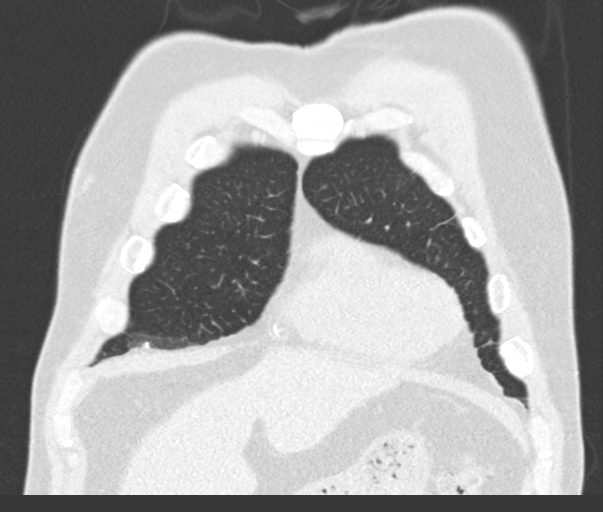
[im 55/137  lung]
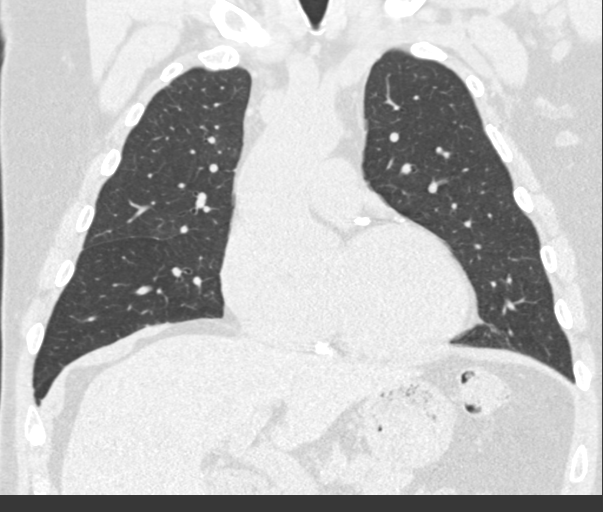
[im 82/137  lung]
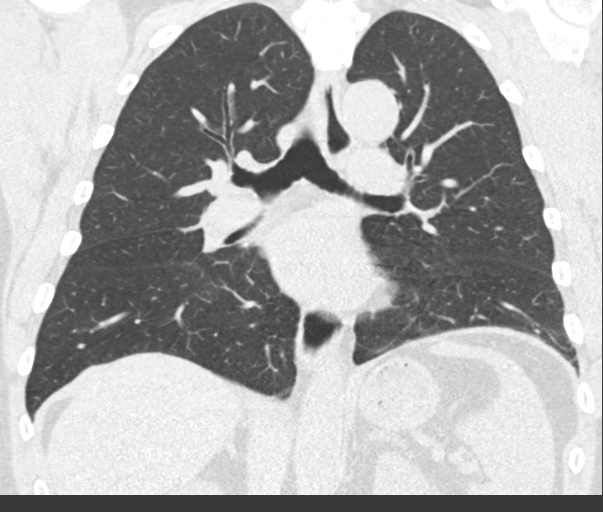

[15 of 36 positions shown; findings below may reference images not displayed]

FINDINGS: Lungs are well inflated without evidence of effusion or
consolidation. Again noted is a small nodular density over the
central right infrahilar region abutting the medial aspect of the
major fissure. This measures 1.2 cm in transverse dimension
unchanged from [DATE] with no suspicious uptake on PET-CT
[DATE]. Visually, this appears slightly smaller overall compared
to the [DATE] exam. No other nodules identified. Airways are
normal.

Heart is normal in size. There is evidence of left main and 3 vessel
coronary artery disease with suggestion of stents over the left
anterior descending and lateral circumflex coronary arteries. Mild
dilatation of the ascending thoracic aorta measuring 3.9 cm in AP
diameter unchanged. No evidence of mediastinal or axillary
adenopathy. Remaining mediastinal structures are within normal.

Images through the upper abdomen demonstrate no focal abnormality.
Mild degenerate change of the spine.
IMPRESSION: 1.2 cm nodule over the right infrahilar region unchanged from
[DATE] and without suspicious uptake on PET-CT [DATE].
Recommend an additional six-month followup chest CT with the goal of
documenting stability over 2 years.

Atherosclerotic coronary artery disease. Coronary stent placement as
described.

Ectasia of the ascending thoracic aorta measuring 3.9 cm in AP
diameter unchanged. Recommend annual imaging followup by CTA or MRA.
This recommendation follows [E4]
ACCF/AHA/AATS/ACR/ASA/SCA/JIM/JIM/JIM/JIM Guidelines for the
Diagnosis and Management of Patients with Thoracic Aortic Disease.
Circulation.[E4]; 121: [id number].

## 2015-11-27 ENCOUNTER — Encounter: Payer: Self-pay | Admitting: Pulmonary Disease

## 2015-11-27 ENCOUNTER — Ambulatory Visit (INDEPENDENT_AMBULATORY_CARE_PROVIDER_SITE_OTHER): Payer: Medicare Other | Admitting: Pulmonary Disease

## 2015-11-27 VITALS — BP 150/90 | HR 70 | Ht 71.0 in | Wt 237.8 lb

## 2015-11-27 DIAGNOSIS — IMO0001 Reserved for inherently not codable concepts without codable children: Secondary | ICD-10-CM

## 2015-11-27 DIAGNOSIS — R911 Solitary pulmonary nodule: Secondary | ICD-10-CM

## 2015-11-27 NOTE — Patient Instructions (Signed)
1. Call me if you have any new breathing problems before your next appointment. 2. We will arrange a CT scan of your chest in August 2017. 3. I will see you back in August after CT scan is complete.  TESTS ORDERED: 1. CT chest without contrast August 2017

## 2015-11-27 NOTE — Progress Notes (Signed)
Subjective:    Patient ID: Troy [name], male    DOB: 1951-10-16, 65 y.o.   MRN: [id number]  C.C.:  Follow-up for Right Lung Nodule.  HPI Right Lung Nodule:  First noted on CTA of the chest August 2016. No hypermetabolic activity on PET CT scan from September 2016. No evidence of enlargement on most recent CT scan this month. He reports some slight upper chest discomfort that is chronic with deep inspiration. He is continues to exercise without any problems. Denies any hemoptysis.  Review of Systems No fever, chills or sweats. Chronic tenderness in his cervical lymph nodes. No noticeable adenopathy in his neck, groin, or axilla.   Allergies  Allergen Reactions  . Gluten Meal Other (See Comments)    REACTION: Celiac disease  . Tizanidine Anxiety    Depression  . Whey Other (See Comments)    REACTION: Celiac disease  . Morphine And Related Other (See Comments)    REACTION: "REALLY BAD STOMACH PAIN"   Past Medical History  Diagnosis Date  . Essential hypertension   . Sleep apnea, obstructive   . CAD (coronary artery disease)     a. s/p PCI in 2010 in [place]. b. Unstable angina/LHC [id number] complicated by V fib arrest after contrast injection. Cath 06/19/2015 s/p DES to mid LAD and RPDA.  Marland Kitchen PAF (paroxysmal atrial fibrillation) ([place])     a. 06/2015 noted to be in new a-fib when arrived with unstable angina. Converted to NSR after being shocked in the cath lab for vfib;  b. 06/2015 Eliquis initiated as PAF noted on event monitor.  . Pulmonary nodule   . Ventricular fibrillation ([place])     a. occured during cath 06/18/2015.  Marland Kitchen Dyslipidemia   . Pulmonary nodule     a. 1.5 x 1.2 cm smoothly marginated nodule in the central aspect of the right lower lobe with recommendation correlation with nonemergent PET-CT to exclude a neoplasm - f/u pulm planned.  . Mitral regurgitation     a. Mild-mod by echo 06/2015.  . Lung nodule < 6cm on CT 06/16/15    Right lung   Past Surgical History    Procedure Laterality Date  . Replacement total knee Left   . Coronary angioplasty with stent placement    . Cardiac catheterization N/A 06/18/2015    Procedure: Left Heart Cath and Coronary Angiography;  Surgeon: Burnell Blanks, MD;  Location: [facility] [place] [facility];  Service: Cardiovascular;  Laterality: N/A;  . Cardiac catheterization N/A 06/19/2015    Procedure: Coronary Stent Intervention;  Surgeon: Peter M Martinique, MD;  Location: [facility];  Service: Cardiovascular;  Laterality: N/A;   Family History  Problem Relation Age of Onset  . Stroke Mother   . Emphysema Mother   . Lung cancer Mother   . Heart attack Father   . Drug abuse Father   . Diabetes Sister   . Heart attack Maternal Grandfather   . Heart attack Paternal Grandfather    Social History   Social History  . Marital Status: Married    Spouse Name: N/A  . Number of Children: N/A  . Years of Education: N/A   Social History Main Topics  . Smoking status: Never Smoker   . Smokeless tobacco: Never Used     Comment: Second-hand exposure through parents  . Alcohol Use: 0.0 oz/week    0 Standard drinks or equivalent per week     Comment: Social EtOH  . Drug Use: No  .  Sexual Activity: Not Asked   Other Topics Concern  . None   Social History Narrative   Originally he is from [place], [place]. He moved to [facility] in 2013. He has a dog & cat at home. No bird or mold exposure. Has a hot tub but hasn't used it in 4 months. 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       Objective:   Physical Exam BP 150/90 mmHg  Pulse 70  Ht 5\' 11"  (1.803 m)  Wt 237 lb 12.8 oz (107.865 kg)  BMI 33.18 kg/m2  SpO2 97% General:  Awake. Alert.[name] Caucasian male.  Integument:  Warm & dry. No rash on exposed skin.  Lymphatics:  No appreciated cervical or supraclavicular lymphadenoapthy. HEENT:  Moist mucus membranes. No oral ulcers. Minimal nasal turbinate swelling.  Cardiovascular:  Regular rate. No edema. Normal S1 & S2.  Pulmonary:   Clear bilaterally to auscultation. Normal work of breathing on room air. Speaking in complete sentences.  Abdomen: Soft. Normal bowel sounds. Nondistended. Grossly nontender. Musculoskeletal:  Normal bulk and tone. No joint deformity or effusion appreciated.  IMAGING CT CHEST W/O 11/15/15 (personally reviewed by me with the patient): No pathologic mediastinal adenopathy. 1.2 cm right hilar lymph node/nodule unchanged. No developing nodule or opacity. No pleural effusion or thickening. No pericardial effusion.  PET CT 08/07/15 (previously reviewed by me): 1.2 cm right perihilar nodule or lymph node without hypermetabolic activity above background. 8 mm mildly hypermetabolic lymph node in the AP window. No other suspicious areas of hypermetabolic activity noted.  CTA CHEST 06/16/15 (previously reviewed by me): No pulmonary embolus. No pathologic mediastinal adenopathy. Air present in esophagus to the cervical spine. No pleural effusion or thickening. No pericardial effusion. 1.5x1.2cm rounded nodular density in the fissure adjacent the pulmonary artery & right lower lobe bronchus. No other nodules or opacities appreciated.  LABS 06/28/15 CBC: 6.9/13.8/41.0/194    Assessment & Plan:  65 year old male with right hilar lung nodule. Lung nodule unchanged on repeat CT imaging this month compared with August 2016. Patient had no hypermetabolic activity within lung nodule on PET/CT imaging and his paratracheal hypermetabolic lymph node was likely secondary to acute illness at that time. Given the continued stability of the patient's lung nodule he is electing for further monitoring with CT imaging which I feel is reasonable. I instructed the patient to contact my office for any new breathing problems before his next appointment.  1. Right lung nodule: Repeat CT chest without contrast in August 2017. 3. Follow-up: Return to clinic in August 2017 after CT is complete or sooner if needed.

## 2015-12-07 DIAGNOSIS — H16401 Unspecified corneal neovascularization, right eye: Secondary | ICD-10-CM | POA: Diagnosis not present

## 2015-12-07 DIAGNOSIS — H1789 Other corneal scars and opacities: Secondary | ICD-10-CM | POA: Diagnosis not present

## 2016-04-14 ENCOUNTER — Other Ambulatory Visit: Payer: Self-pay | Admitting: Nurse Practitioner

## 2016-06-23 ENCOUNTER — Telehealth: Payer: Self-pay | Admitting: Pulmonary Disease

## 2016-06-23 ENCOUNTER — Inpatient Hospital Stay: Admission: RE | Admit: 2016-06-23 | Payer: Medicare Other | Source: Ambulatory Visit

## 2016-06-23 DIAGNOSIS — R0989 Other specified symptoms and signs involving the circulatory and respiratory systems: Secondary | ICD-10-CM

## 2016-06-23 NOTE — Telephone Encounter (Signed)
LMTCB for pt to follow up on missed CT appt.

## 2016-06-24 NOTE — Telephone Encounter (Signed)
lmtcb x2 for pt. 

## 2016-06-25 NOTE — Telephone Encounter (Signed)
lmtcb x3 for pt. Message will be closed per triage protocol.

## 2016-07-25 ENCOUNTER — Ambulatory Visit (INDEPENDENT_AMBULATORY_CARE_PROVIDER_SITE_OTHER)
Admission: RE | Admit: 2016-07-25 | Discharge: 2016-07-25 | Disposition: A | Discharge status: Home / Self Care | Payer: Medicare Other | Source: Ambulatory Visit | Attending: Pulmonary Disease | Admitting: Pulmonary Disease

## 2016-07-25 DIAGNOSIS — IMO0001 Reserved for inherently not codable concepts without codable children: Secondary | ICD-10-CM

## 2016-07-25 DIAGNOSIS — R911 Solitary pulmonary nodule: Secondary | ICD-10-CM | POA: Diagnosis not present

## 2016-07-25 IMAGING — CT CT CHEST W/O CM
2 of 3 series · 15 of 36 positions shown, 18 images · non-contrast
Comparison: [DATE], [DATE].

CLINICAL DATA: Follow-up lung nodule.

EXAM:
CT CHEST WITHOUT CONTRAST
TECHNIQUE: Multidetector CT imaging of the chest was performed following the
standard protocol without IV contrast.

[Series 2: thorax · axial · 0.89mm/px · z∈[-316,-40]mm · 12 of 164 slices shown, 15 images]
[im 13/164  mediastinal]
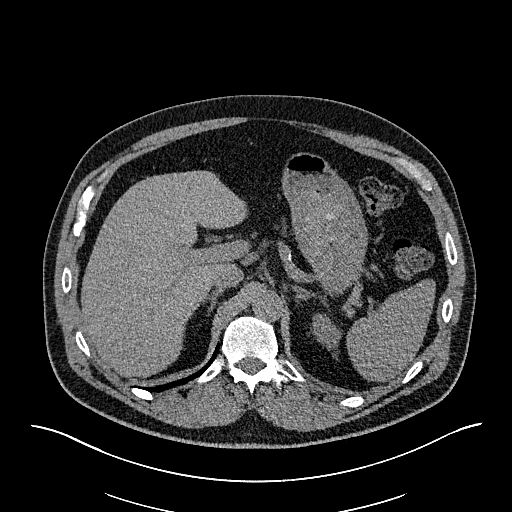
[im 13/164  lung]
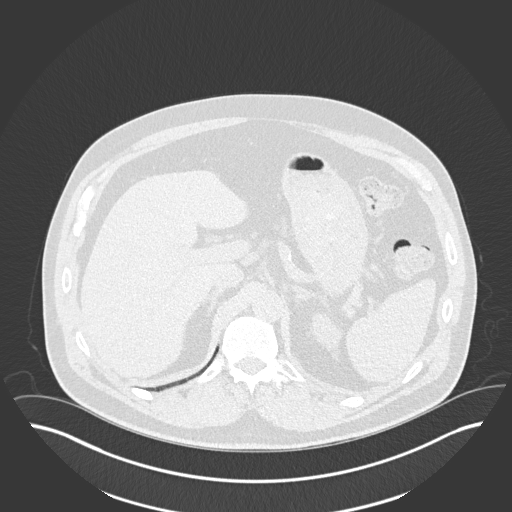
[im 25/164  lung]
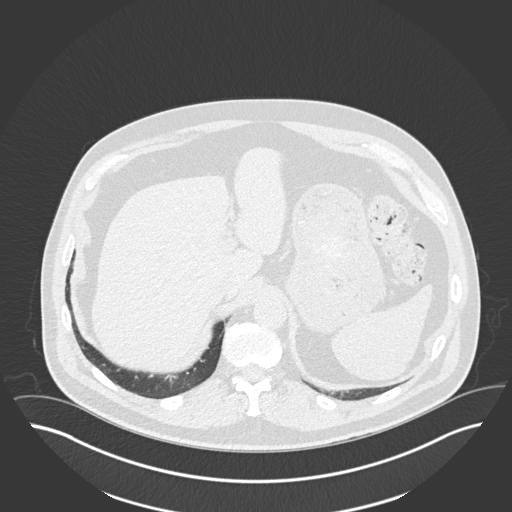
[im 37/164  lung]
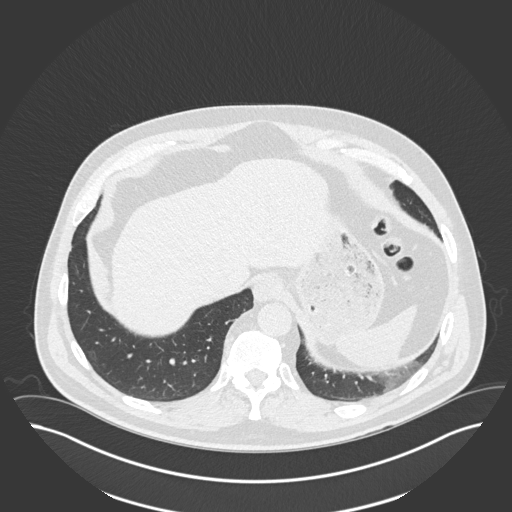
[im 49/164  lung]
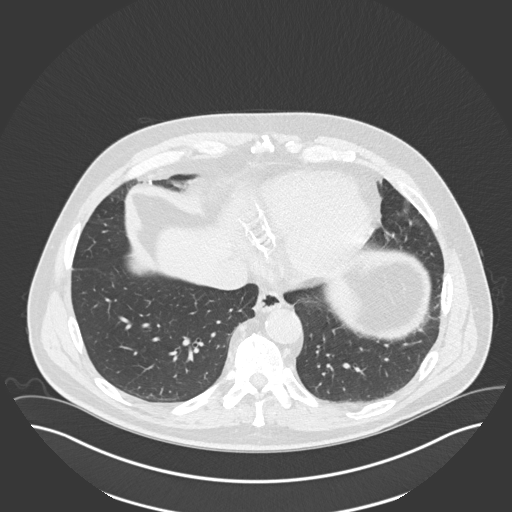
[im 61/164  mediastinal]
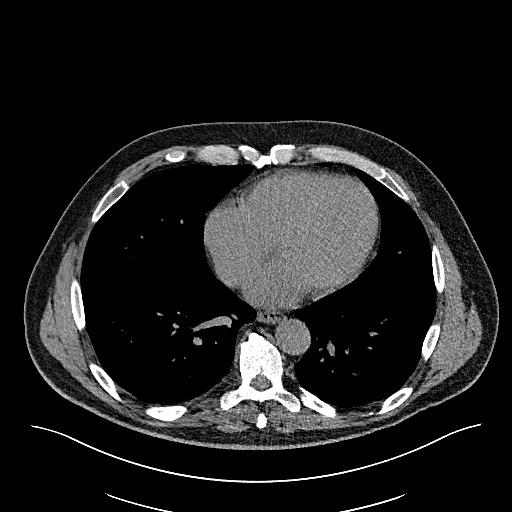
[im 61/164  lung]
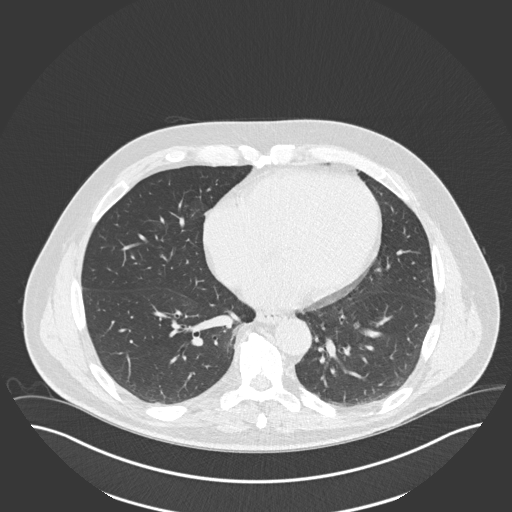
[im 73/164  lung]
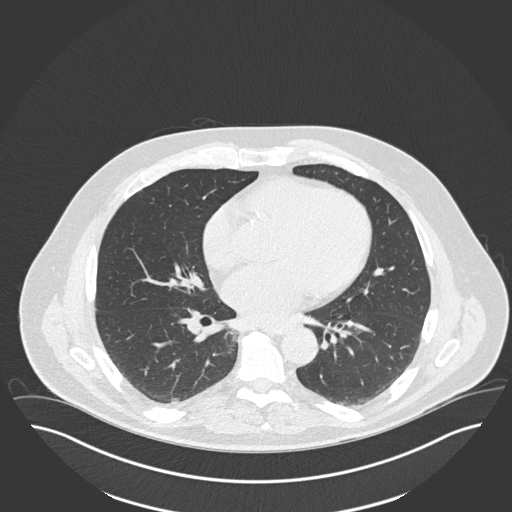
[im 91/164  lung]
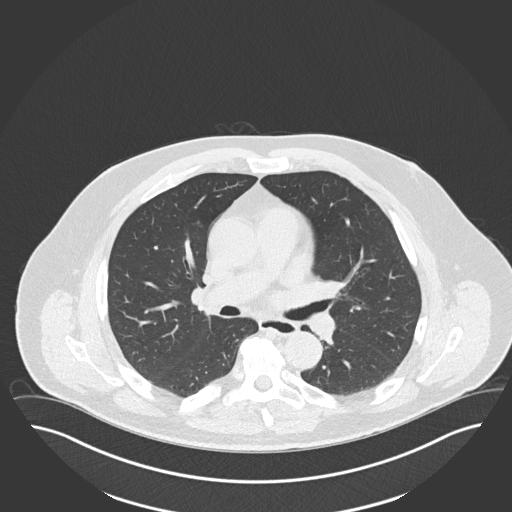
[im 103/164  lung]
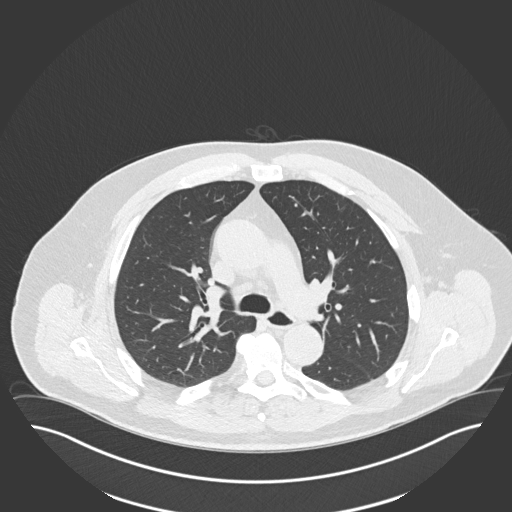
[im 115/164  mediastinal]
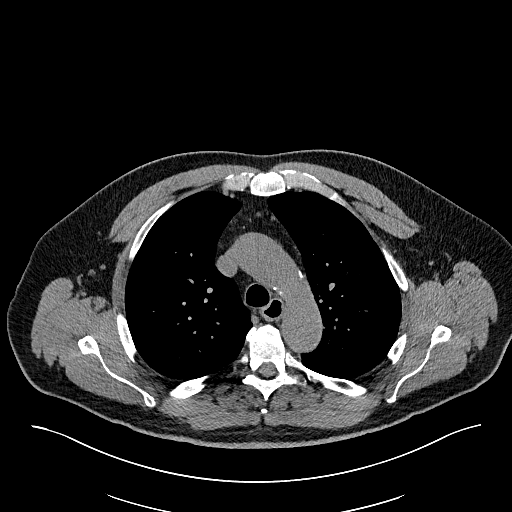
[im 115/164  lung]
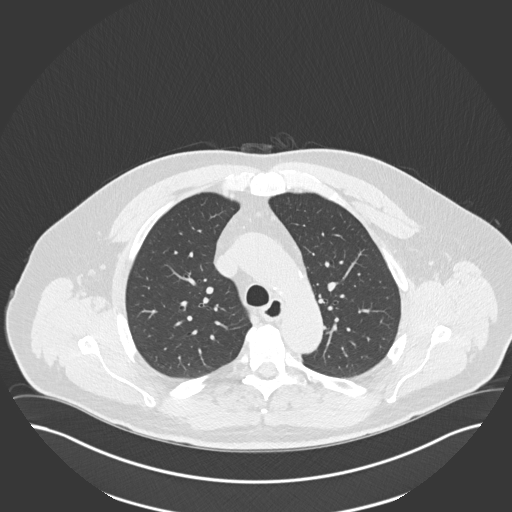
[im 127/164  lung]
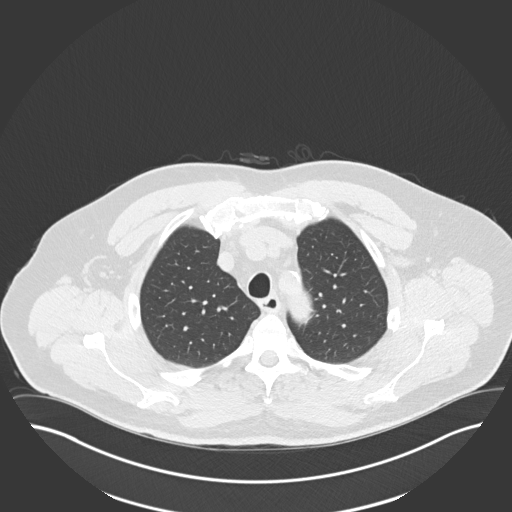
[im 139/164  lung]
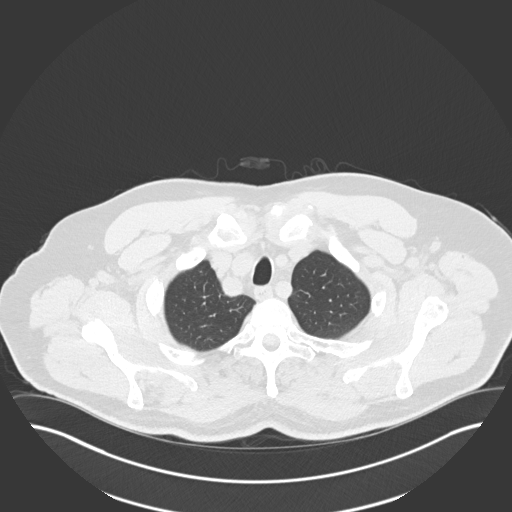
[im 151/164  lung]
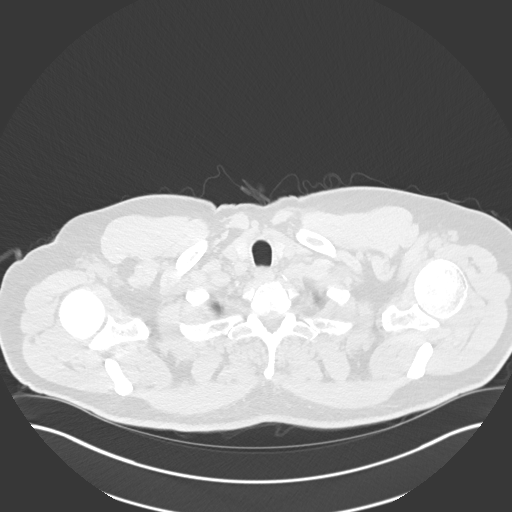

[Series 5: coronal · coronal · 0.64mm/px · 3 of 133 slices shown]
[im 27/133  lung]
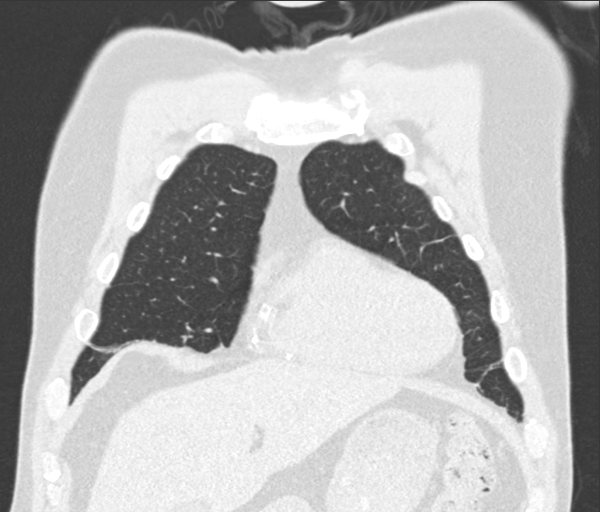
[im 53/133  lung]
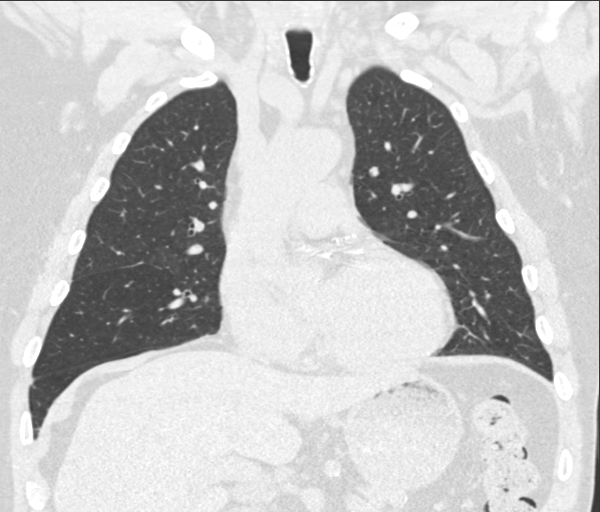
[im 80/133  lung]
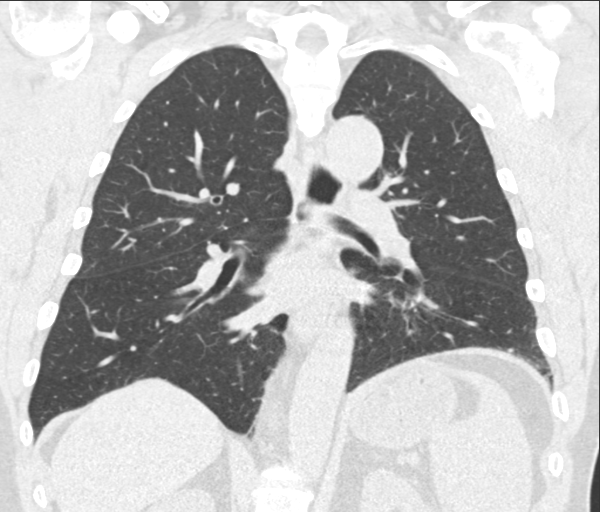

[15 of 36 positions shown; findings below may reference images not displayed]

FINDINGS: Cardiovascular: Heart is normal size. Aorta is normal caliber. Dense
diffuse coronary artery calcifications. Scattered aortic
calcifications.

Mediastinum/Nodes: No mediastinal, hilar, or axillary adenopathy.

Lungs/Pleura: Stable central 12 mm right lower lobe nodule adjacent
to the central right lower lobe pulmonary artery. No new or
enlarging pulmonary nodules. No confluent opacities. No effusions.

Upper Abdomen: Imaging into the upper abdomen shows no acute
findings.

Musculoskeletal: Chest wall soft tissues are unremarkable. No acute
bony abnormality or focal bone lesion.
IMPRESSION: Stable central right lower lobe 12 mm pulmonary nodule. This could
be followed with repeat CT in 1 year to ensure 2 year stability.

Advanced coronary artery calcification/disease.

## 2016-07-28 ENCOUNTER — Inpatient Hospital Stay: Admission: RE | Admit: 2016-07-28 | Payer: Medicare Other | Source: Ambulatory Visit

## 2016-08-01 ENCOUNTER — Telehealth: Payer: Self-pay | Admitting: Pulmonary Disease

## 2016-08-01 DIAGNOSIS — R911 Solitary pulmonary nodule: Secondary | ICD-10-CM

## 2016-08-01 NOTE — Telephone Encounter (Signed)
Troy Glazier, MD  Inge Rise, CMA        Please let the patient know that his lung nodule hasn't changed in size. There are no other new abnormalities on his CT scan either. He was supposed to have an appointment with me to discuss this but I don't see it scheduled. If he wishes to see the images and discuss it in person then please schedule him an appointment. Otherwise currently guidelines dictate we schedule a final repeat CT scan in August 2018 and he should definitely have a follow-up appointment with me after that one. Thanks.     I spoke with patient about results and he verbalized understanding and had no questions CT ordered for 06/2017

## 2016-08-18 ENCOUNTER — Other Ambulatory Visit: Payer: Self-pay | Admitting: Cardiology

## 2016-09-17 ENCOUNTER — Other Ambulatory Visit: Payer: Self-pay | Admitting: Cardiology

## 2016-09-24 ENCOUNTER — Other Ambulatory Visit: Payer: Self-pay | Admitting: Cardiology

## 2016-09-25 DIAGNOSIS — Z125 Encounter for screening for malignant neoplasm of prostate: Secondary | ICD-10-CM | POA: Diagnosis not present

## 2016-09-25 DIAGNOSIS — R739 Hyperglycemia, unspecified: Secondary | ICD-10-CM | POA: Diagnosis not present

## 2016-09-25 DIAGNOSIS — E784 Other hyperlipidemia: Secondary | ICD-10-CM | POA: Diagnosis not present

## 2016-09-25 DIAGNOSIS — I10 Essential (primary) hypertension: Secondary | ICD-10-CM | POA: Diagnosis not present

## 2016-10-01 DIAGNOSIS — Z6835 Body mass index (BMI) 35.0-35.9, adult: Secondary | ICD-10-CM | POA: Diagnosis not present

## 2016-10-01 DIAGNOSIS — I25118 Atherosclerotic heart disease of native coronary artery with other forms of angina pectoris: Secondary | ICD-10-CM | POA: Diagnosis not present

## 2016-10-01 DIAGNOSIS — Z0000 Encounter for general adult medical examination without abnormal findings: Secondary | ICD-10-CM | POA: Diagnosis not present

## 2016-10-01 DIAGNOSIS — E784 Other hyperlipidemia: Secondary | ICD-10-CM | POA: Diagnosis not present

## 2016-10-01 DIAGNOSIS — I10 Essential (primary) hypertension: Secondary | ICD-10-CM | POA: Diagnosis not present

## 2016-10-01 DIAGNOSIS — I480 Paroxysmal atrial fibrillation: Secondary | ICD-10-CM | POA: Diagnosis not present

## 2016-10-01 DIAGNOSIS — R918 Other nonspecific abnormal finding of lung field: Secondary | ICD-10-CM | POA: Diagnosis not present

## 2016-10-08 ENCOUNTER — Other Ambulatory Visit: Payer: Self-pay | Admitting: Cardiology

## 2016-10-17 ENCOUNTER — Other Ambulatory Visit: Payer: Self-pay | Admitting: Cardiology

## 2016-11-06 ENCOUNTER — Ambulatory Visit (INDEPENDENT_AMBULATORY_CARE_PROVIDER_SITE_OTHER): Payer: Medicare Other | Admitting: Physician Assistant

## 2016-11-06 ENCOUNTER — Encounter: Payer: Self-pay | Admitting: Physician Assistant

## 2016-11-06 ENCOUNTER — Telehealth: Payer: Self-pay

## 2016-11-06 VITALS — BP 150/82 | HR 115 | Ht 71.0 in | Wt 244.8 lb

## 2016-11-06 DIAGNOSIS — I4819 Other persistent atrial fibrillation: Secondary | ICD-10-CM

## 2016-11-06 DIAGNOSIS — I2510 Atherosclerotic heart disease of native coronary artery without angina pectoris: Secondary | ICD-10-CM | POA: Diagnosis not present

## 2016-11-06 DIAGNOSIS — E785 Hyperlipidemia, unspecified: Secondary | ICD-10-CM | POA: Diagnosis not present

## 2016-11-06 DIAGNOSIS — I481 Persistent atrial fibrillation: Secondary | ICD-10-CM | POA: Diagnosis not present

## 2016-11-06 DIAGNOSIS — Z7901 Long term (current) use of anticoagulants: Secondary | ICD-10-CM

## 2016-11-06 MED ORDER — RIVAROXABAN 20 MG PO TABS: 20.0000 mg | ORAL_TABLET | Freq: Every day | ORAL | 11 refills | Status: DC

## 2016-11-06 MED ORDER — DILTIAZEM HCL ER COATED BEADS 120 MG PO TB24: 120.0000 mg | ORAL_TABLET | Freq: Every day | ORAL | 3 refills | Status: DC

## 2016-11-06 MED ORDER — NITROGLYCERIN 0.4 MG SL SUBL: 0.4000 mg | SUBLINGUAL_TABLET | SUBLINGUAL | 3 refills | Status: DC | PRN

## 2016-11-06 NOTE — Patient Instructions (Addendum)
Medication Instructions:  START XARELTO 20MG  DAILY START CARDIZEM CD 120MG  DAILY  If you need a refill on your cardiac medications before your next appointment, please call your pharmacy.  Labwork: PLEASE HAVE PRIMARY FAX RECENT LAB WORK FOR [place] TO REVIEW TO [phone]  Testing/Procedures: Your physician has requested that you have an echocardiogram. Echocardiography is a painless test that uses sound waves to create images of your heart. It provides your doctor with information about the size and shape of your heart and how well your heart's chambers and valves are working. This procedure takes approximately one hour. There are no restrictions for this procedure.  Follow-Up: Your physician recommends that you schedule a follow-up appointment in: [place], PA-C  Your physician recommends that you schedule a follow-up appointment [place] MD TO [place]!!    Thank you for choosing [facility] at [employer], LPN RHONDA BARRETT, PA-C

## 2016-11-06 NOTE — Progress Notes (Signed)
Cardiology Office Note   Date:  11/06/2016   ID:  Troy [name], DOB [birth year], MRN [id number]  PCP:  Velna Hatchet, MD  Cardiologist:  Dr Julaine Fusi, PA-C    History of Present Illness: Troy [name] is a 65 y.o. male with a history of 06/2015 DES mLAD & RPDA w/ VF arrest, PCI in [place] 2010, HTN, HLD, MR, PAF, not anticoagulated, pulm nodule stable 07/2016  12/28 Pt came in to [facility] office w/ irreg HR, c/o recurrent afib>>appt for today made.  Troy [name] presents for evaluation of palpitations.   He does cardio 30 min/day, tries to get 15,000 steps/day. He does not get chest pain or SOB w/ exertion.   A month ago, on the exercise bike, his HR would not register. At other times, his HR would be 150 or 170. His Fitbit apparently does not read the HR accurately with the afib.   He has "white-coat" syndrome and his BP generally runs high when he first gets to the office, will improve over time.   6 months ago, he thought he was in atrial fib, he rested and symptoms went away.  He had a foot injury and was unable to exercise for 4-5 months. He gained weight and fitness went down. He has been exercising and eating better and has lost from 250 lbs down to current weight.   His previous afib was very symptomatic, and in the setting of untreated HTN/CAD. This has not been the case this time. He has not really felt DOE, no activity limitations. He noticed a decreased fitness level, but attributed that to his layoff after foot injury.   Today, he got his girlfriend to check his pulse. She noted it was irregular and rapid, hence he sought care.   Past Medical History:  Diagnosis Date  . CAD (coronary artery disease)    a. s/p PCI in 2010 in [place]. b. Unstable angina/LHC A999333 complicated by V fib arrest after contrast injection. Cath 06/19/2015 s/p DES to mid LAD and RPDA.  Marland Kitchen Dyslipidemia   . Essential hypertension   . Lung nodule < 6cm on CT 06/16/15   Right lung  . Mitral regurgitation    a. Mild-mod by echo 06/2015.  Marland Kitchen PAF (paroxysmal atrial fibrillation) ([place])    a. 06/2015 noted to be in new a-fib when arrived with unstable angina. Converted to NSR after being shocked in the cath lab for vfib;  b. 06/2015 Eliquis initiated as PAF noted on event monitor.  . Pulmonary nodule 07/2015   a. 1.5 x 1.2 cm smoothly marginated nodule in the central aspect of the right lower lobe with recommendation correlation with nonemergent PET-CT to exclude a neoplasm , stable by CT 07/2016  . Sleep apnea, obstructive   . Ventricular fibrillation ([place])    a. occured during cath 06/18/2015.    Past Surgical History:  Procedure Laterality Date  . CARDIAC CATHETERIZATION N/A 06/18/2015   Procedure: Left Heart Cath and Coronary Angiography;  Surgeon: Burnell Blanks, MD;  Location: [place] [facility];  Service: Cardiovascular;  Laterality: N/A;  . CARDIAC CATHETERIZATION N/A 06/19/2015   Procedure: Coronary Stent Intervention;  Surgeon: Peter M Martinique, MD;  Location: [place] [facility];  Service: Cardiovascular;  Laterality: N/A;  . CORONARY ANGIOPLASTY WITH STENT PLACEMENT    . REPLACEMENT TOTAL KNEE Left     Current Outpatient Prescriptions  Medication Sig Dispense Refill  . losartan (COZAAR) 100 MG tablet Take 1 tablet (  100 mg total) by mouth daily. 30 tablet 1  . metoprolol (LOPRESSOR) 100 MG tablet TAKE 1 TABLET BY MOUTH TWICE A DAY 60 tablet 1  . niacin 100 MG tablet Take 100 mg by mouth 2 (two) times daily with a meal.    . nitroGLYCERIN (NITROSTAT) 0.4 MG SL tablet Place 1 tablet (0.4 mg total) under the tongue every 5 (five) minutes as needed for chest pain. 25 tablet 3  . Omega-3 Fatty Acids (FISH OIL) 500 MG CAPS Take 2,000 mg by mouth daily.      No current facility-administered medications for this visit.     Allergies:   Gluten meal; Tizanidine; Whey; and Morphine and related    Social History:  The patient  reports that he has never  smoked. He has never used smokeless tobacco. He reports that he drinks alcohol. He reports that he does not use drugs.   Family History:  The patient's family history includes Diabetes in his sister; Drug abuse in his father; Emphysema in his mother; Heart attack in his father, maternal grandfather, and paternal grandfather; Lung cancer in his mother; Stroke in his mother.    ROS:  Please see the history of present illness. All other systems are reviewed and negative.    PHYSICAL EXAM: VS:  BP (!) 150/82   Pulse (!) 115   Ht 5\' 11"  (1.803 m)   Wt 244 lb 12.8 oz (111 kg)   BMI 34.14 kg/m  , BMI Body mass index is 34.14 kg/m. GEN: Well nourished, well developed, male in no acute distress  HEENT: normal for age  Neck: no JVD, no carotid bruit, no masses Cardiac: Irreg R&R; no murmur, no rubs, or gallops Respiratory:  clear to auscultation bilaterally, normal work of breathing GI: soft, nontender, nondistended, + BS MS: no deformity or atrophy; no edema; distal pulses are 2+ in all 4 extremities   Skin: warm and dry, no rash Neuro:  Strength and sensation are intact Psych: euthymic mood, full affect   EKG:  EKG is ordered today. The ekg ordered today demonstrates Atrial flutter, HR 115, no acute changes   Recent Labs: No results found for requested labs within last 8760 hours.    Lipid Panel    Component Value Date/Time   CHOL 94 (L) 08/15/2015 0912   TRIG 74 08/15/2015 0912   HDL 37 (L) 08/15/2015 0912   CHOLHDL 2.5 08/15/2015 0912   VLDL 15 08/15/2015 0912   LDLCALC 42 08/15/2015 0912     Wt Readings from Last 3 Encounters:  11/06/16 244 lb 12.8 oz (111 kg)  11/27/15 237 lb 12.8 oz (107.9 kg)  08/29/15 234 lb (106.1 kg)     Other studies Reviewed: Additional studies/ records that were reviewed today include: office notes, hospital records and testing.  ASSESSMENT AND PLAN:  1.  Parox Atrial flutter: unknown duration, may be a month or more.  Discussed  options, but advised him they all included anticoagulation. He prefers: anticoag, since he is on high dose of BB and baseline HR elevated, will add Cardizem CD 120 MG QD, check echo.   Follow up in 3 weeks or so with me or MD. If not in SR, can decide on DCCV +/- antiarrhythmic therapy.  He would probably not like the side effect profile of most anti-arrhythmics.  2. Anticoagulation: CHA2DS2VASc=3 (age x 1, CAD, HTN) Pt started on Xarelto  3. CAD: No ischemic sx, continue Plavix, BB, ARB. No ASA with Xarelto  4.  Dyslipidemia: Pt was on statin but stopped it and went to Omega 3s. He was advised that HDL is helped by that but statins are more effective on LDL.  He had a recent profile done per PCP. He will get [place] the results. He was advised that his goal LDL is < 70 and he needs to keep the ratio as low as possible.   Current medicines are reviewed at length with the patient today.  The patient does not have concerns regarding medicines.  The following changes have been made: add Cardizem CD 120 mg qd and Xarelto 20 mg qd  Labs/ tests ordered today include:   Orders Placed This Encounter  Procedures  . EKG 12-Lead  . ECHOCARDIOGRAM COMPLETE     Disposition:   FU with Dr Radford Pax  Signed, Rosaria Ferries, PA-C  11/06/2016 5:16 PM    [place] Phone: [phone]; Fax: [phone]  This note was written with the assistance of speech recognition software. Please excuse any transcriptional errors.

## 2016-11-06 NOTE — Telephone Encounter (Signed)
Patient walked in to office today complaining of afib for about 2 months.  Patient is not acutely ill - he denies CP and racing heart rate. He states he only has "slight shortness of breath" when he goes up stairs.  He simply states his HR is out of rhythm. He reports he takes no blood thinner - he used to take Eliquis but stopped after a month due to cost. He states he is not taking Plavix. He did confirm he is taking Metoprolol. Per walk-in policy, offered patient next available open appointment today at 1400 with Rosaria Ferries, PA. Gave patient address to [place] office.  He was grateful for assistance.

## 2016-11-07 ENCOUNTER — Telehealth: Payer: Self-pay | Admitting: Cardiology

## 2016-11-07 MED ORDER — WARFARIN SODIUM 5 MG PO TABS: 5.0000 mg | ORAL_TABLET | Freq: Every day | ORAL | 0 refills | Status: DC

## 2016-11-07 NOTE — Telephone Encounter (Signed)
Unable to reach patient but have left him a message to reaffirm [facility] instruction as relayed to me - patient should start on Eliquis (provided med unexpired - o/w start on coumadin). Advised in my message that regardless of anticoagulation therapy initiated, he should call us on Tuesday to discuss next steps.  Will hopefully be able to address options for calcium channel blocker/alt med therapy at that time.

## 2016-11-07 NOTE — Telephone Encounter (Signed)
Pt calling regarding an RX sent in by Rosaria Ferries PA yesterday to [facility] for blood thinner and another med. he doesn't know the name of either one. Both were too expensive and needs to see if can get something cheaper-pls call

## 2016-11-07 NOTE — Telephone Encounter (Signed)
Spoke with patient regarding warfarin therapy. He would like to proceed with warfarin, but wants to complete the Eliquis he has on hand. He is currently at a friend's home and unable to verify if Eliquis is in date or how many tablets he has remaining.   Instructed that if he can he should call us back today so that we can coordinate change to warfarin for him.   Advised that will need 3 day overlap and check INR on day 5-7 so will need to make appt. He states he understands and will call as soon as he can confirm Eliquis.

## 2016-11-07 NOTE — Telephone Encounter (Signed)
Pt seen 12/28 by Rosaria Ferries New dx of A Fib.  Spoke w this very pleasant gentleman regarding some concerns obtaining his meds.  Note that he is unable to afford his long acting cardizem or xarelto, prescribed yesterday at [place].  This is due to not having Rx coverage on insurance. Consequently, I discussed w him that we would likely need to investigate generic options. Noted that if he must take cardizem it would probably need to be a short acting generic form, but that I would clarify need for this vs other med option.  Noted that Xarelto, Eliquis, etc all are name brand and these are not going to be be feasible options for him. We discussed likely need for coumadin initiation and dosing via our coumadin clinic. Pt is agreeable to this option. Notes also he has leftover Eliquis which he had taken before and had stopped several months ago. Will need to make sure this med is unexpired but he believes he has approx 20-30 tablets on-hand.  Will seek review on options for meds by Ruthell Rummage.   Pt also wanted to note that he used the nitro SL prescribed at yesterday's appt. Had episode of angina which resolved w 2x tablets. I affirmed that he took this as instructed and he had no lingering symptoms. Will seek any follow up advice from [place].

## 2016-11-07 NOTE — Telephone Encounter (Signed)
Thank you :)

## 2016-11-07 NOTE — Telephone Encounter (Signed)
Discussed w Georgina Peer - Noted we still need to address cost of cardizem, ER form is probably too expensive but also may be case that any diltiazem formulation is going to be cost-prohibitive. Would he be candidate for BB? Suanne Marker, can you advise on this?

## 2016-11-10 ENCOUNTER — Encounter (HOSPITAL_COMMUNITY): Payer: Self-pay | Admitting: *Deleted

## 2016-11-10 ENCOUNTER — Inpatient Hospital Stay (HOSPITAL_COMMUNITY)
Admission: EM | Admit: 2016-11-10 | Discharge: 2016-11-17 | DRG: 287 | Disposition: A | Discharge status: Home / Self Care | Payer: Medicare Other | Attending: Cardiology | Admitting: Cardiology

## 2016-11-10 ENCOUNTER — Emergency Department (HOSPITAL_COMMUNITY): Payer: Medicare Other

## 2016-11-10 DIAGNOSIS — Z955 Presence of coronary angioplasty implant and graft: Secondary | ICD-10-CM | POA: Diagnosis not present

## 2016-11-10 DIAGNOSIS — R000 Tachycardia, unspecified: Secondary | ICD-10-CM | POA: Diagnosis not present

## 2016-11-10 DIAGNOSIS — G4733 Obstructive sleep apnea (adult) (pediatric): Secondary | ICD-10-CM | POA: Diagnosis present

## 2016-11-10 DIAGNOSIS — Z9861 Coronary angioplasty status: Secondary | ICD-10-CM | POA: Diagnosis not present

## 2016-11-10 DIAGNOSIS — Z7901 Long term (current) use of anticoagulants: Secondary | ICD-10-CM | POA: Diagnosis not present

## 2016-11-10 DIAGNOSIS — I4892 Unspecified atrial flutter: Secondary | ICD-10-CM | POA: Diagnosis not present

## 2016-11-10 DIAGNOSIS — I481 Persistent atrial fibrillation: Secondary | ICD-10-CM | POA: Diagnosis not present

## 2016-11-10 DIAGNOSIS — I4819 Other persistent atrial fibrillation: Secondary | ICD-10-CM | POA: Diagnosis present

## 2016-11-10 DIAGNOSIS — I10 Essential (primary) hypertension: Secondary | ICD-10-CM

## 2016-11-10 DIAGNOSIS — I340 Nonrheumatic mitral (valve) insufficiency: Secondary | ICD-10-CM | POA: Diagnosis present

## 2016-11-10 DIAGNOSIS — Z96652 Presence of left artificial knee joint: Secondary | ICD-10-CM | POA: Diagnosis not present

## 2016-11-10 DIAGNOSIS — R079 Chest pain, unspecified: Secondary | ICD-10-CM | POA: Diagnosis present

## 2016-11-10 DIAGNOSIS — E785 Hyperlipidemia, unspecified: Secondary | ICD-10-CM | POA: Diagnosis not present

## 2016-11-10 DIAGNOSIS — Z79899 Other long term (current) drug therapy: Secondary | ICD-10-CM | POA: Diagnosis not present

## 2016-11-10 DIAGNOSIS — I4891 Unspecified atrial fibrillation: Secondary | ICD-10-CM

## 2016-11-10 DIAGNOSIS — I2510 Atherosclerotic heart disease of native coronary artery without angina pectoris: Secondary | ICD-10-CM | POA: Diagnosis not present

## 2016-11-10 DIAGNOSIS — I200 Unstable angina: Secondary | ICD-10-CM | POA: Diagnosis not present

## 2016-11-10 DIAGNOSIS — F4310 Post-traumatic stress disorder, unspecified: Secondary | ICD-10-CM | POA: Diagnosis present

## 2016-11-10 DIAGNOSIS — I25110 Atherosclerotic heart disease of native coronary artery with unstable angina pectoris: Secondary | ICD-10-CM | POA: Diagnosis present

## 2016-11-10 DIAGNOSIS — R911 Solitary pulmonary nodule: Secondary | ICD-10-CM | POA: Diagnosis not present

## 2016-11-10 DIAGNOSIS — I4811 Longstanding persistent atrial fibrillation: Secondary | ICD-10-CM | POA: Diagnosis present

## 2016-11-10 LAB — CBC
HCT: 43.4 % (ref 39.0–52.0)
HEMOGLOBIN: 14.8 g/dL (ref 13.0–17.0)
MCH: 29.6 pg (ref 26.0–34.0)
MCHC: 34.1 g/dL (ref 30.0–36.0)
MCV: 86.8 fL (ref 78.0–100.0)
PLATELETS: 156 10*3/uL (ref 150–400)
RBC: 5 MIL/uL (ref 4.22–5.81)
RDW: 13.2 % (ref 11.5–15.5)
WBC: 7 10*3/uL (ref 4.0–10.5)

## 2016-11-10 LAB — I-STAT TROPONIN, ED: Troponin i, poc: 0.01 ng/mL (ref 0.00–0.08)

## 2016-11-10 LAB — TSH: TSH: 1.09 u[IU]/mL (ref 0.350–4.500)

## 2016-11-10 LAB — TROPONIN I: Troponin I: 0.03 ng/mL (ref <0.03)

## 2016-11-10 LAB — BASIC METABOLIC PANEL
ANION GAP: 9 (ref 5–15)
BUN: 26 mg/dL — ABNORMAL HIGH (ref 6–20)
CHLORIDE: 107 mmol/L (ref 101–111)
CO2: 24 mmol/L (ref 22–32)
Calcium: 9.4 mg/dL (ref 8.9–10.3)
Creatinine, Ser: 1.2 mg/dL (ref 0.61–1.24)
GFR calc non Af Amer: >60 mL/min (ref >60)
Glucose, Bld: 98 mg/dL (ref 65–99)
POTASSIUM: 4.6 mmol/L (ref 3.5–5.1)
Sodium: 140 mmol/L (ref 135–145)

## 2016-11-10 LAB — POCT I-STAT TROPONIN I: Troponin i, poc: 0.04 ng/mL (ref 0.00–0.08)

## 2016-11-10 IMAGING — CR DG CHEST 2V
2 series · 2 of 2 positions shown · non-contrast
Comparison: [DATE] CT

CLINICAL DATA: Atrial fibrillation x1 month with left upper chest
pain and dyspnea times 3-4 days.

EXAM:
CHEST  2 VIEW

[chest pa]
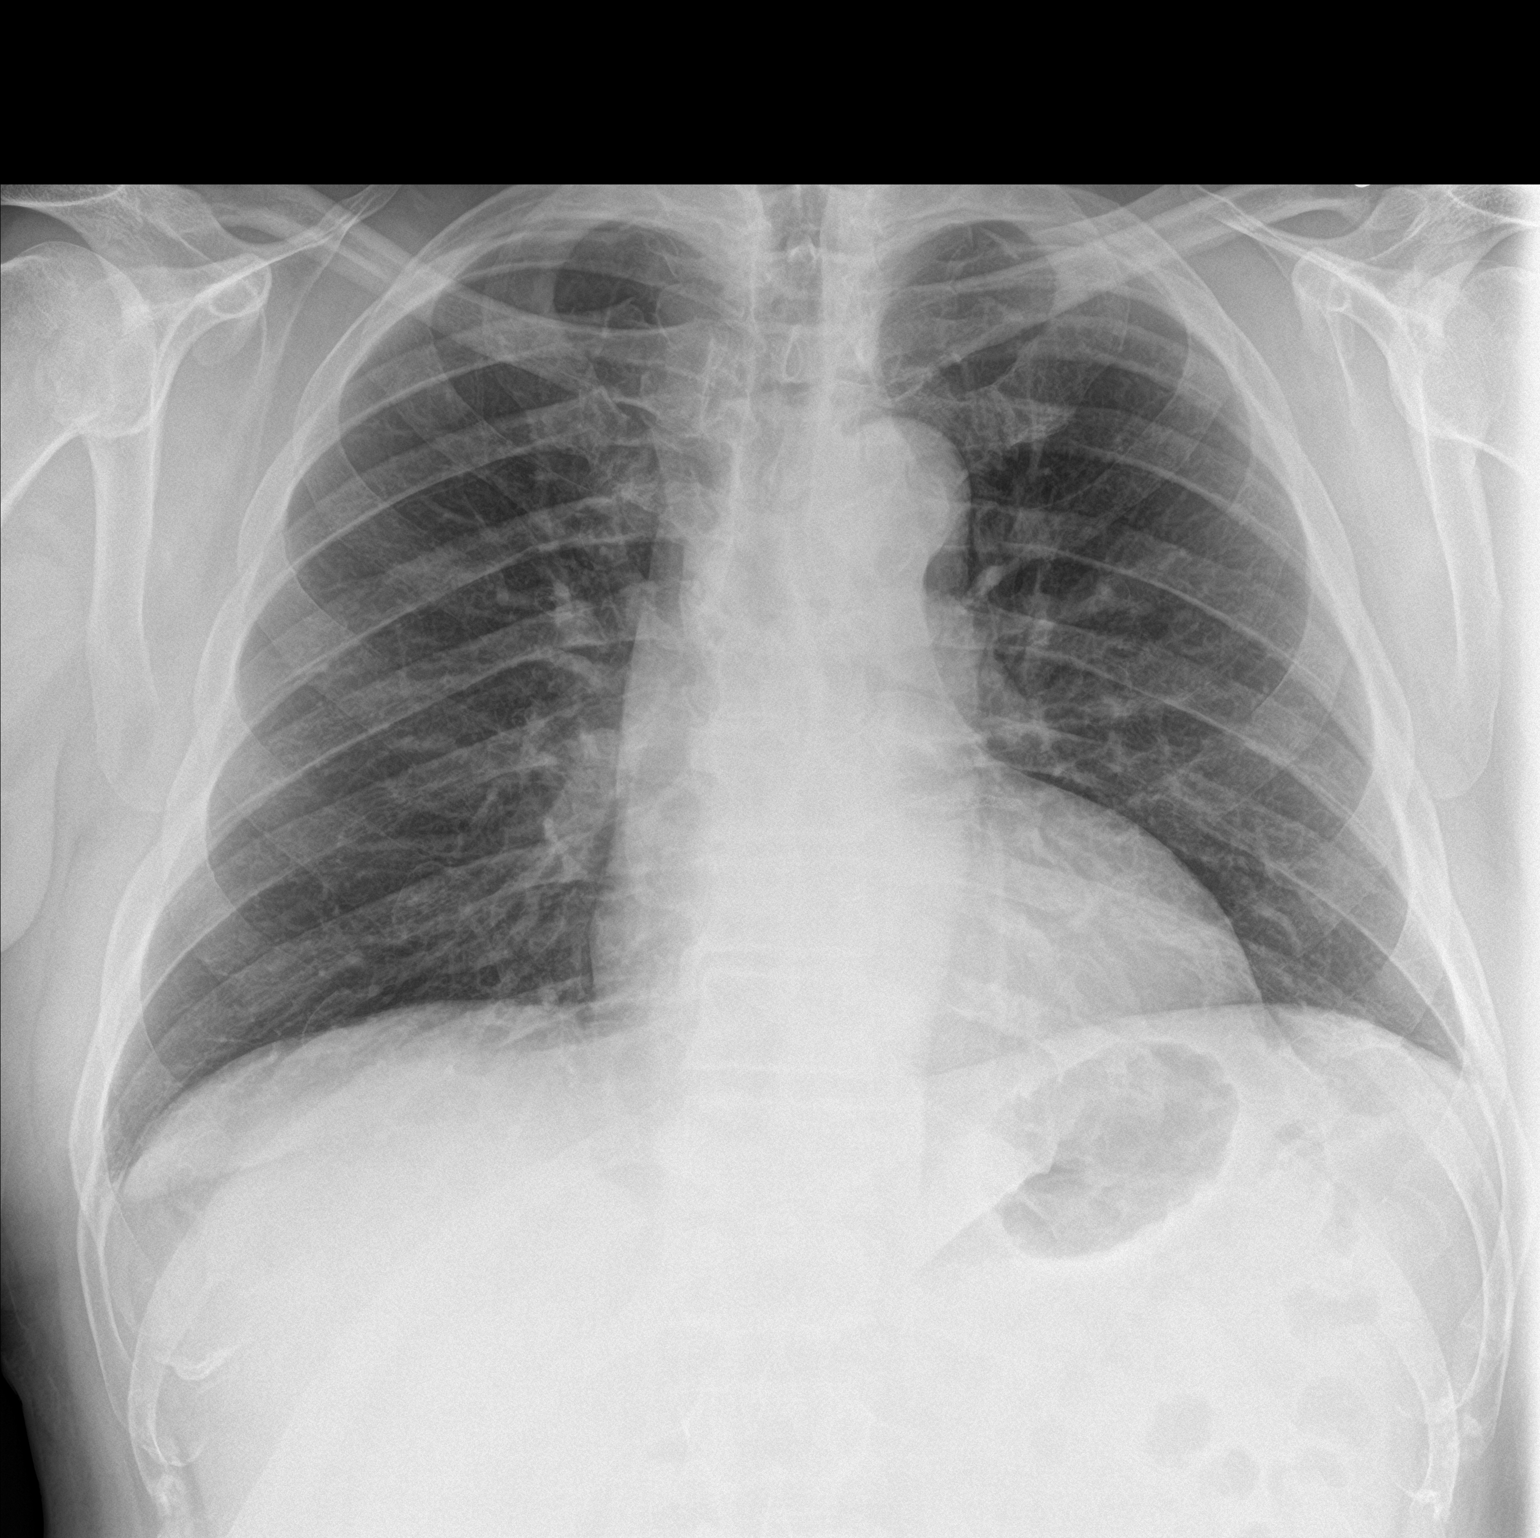

[chest lat]
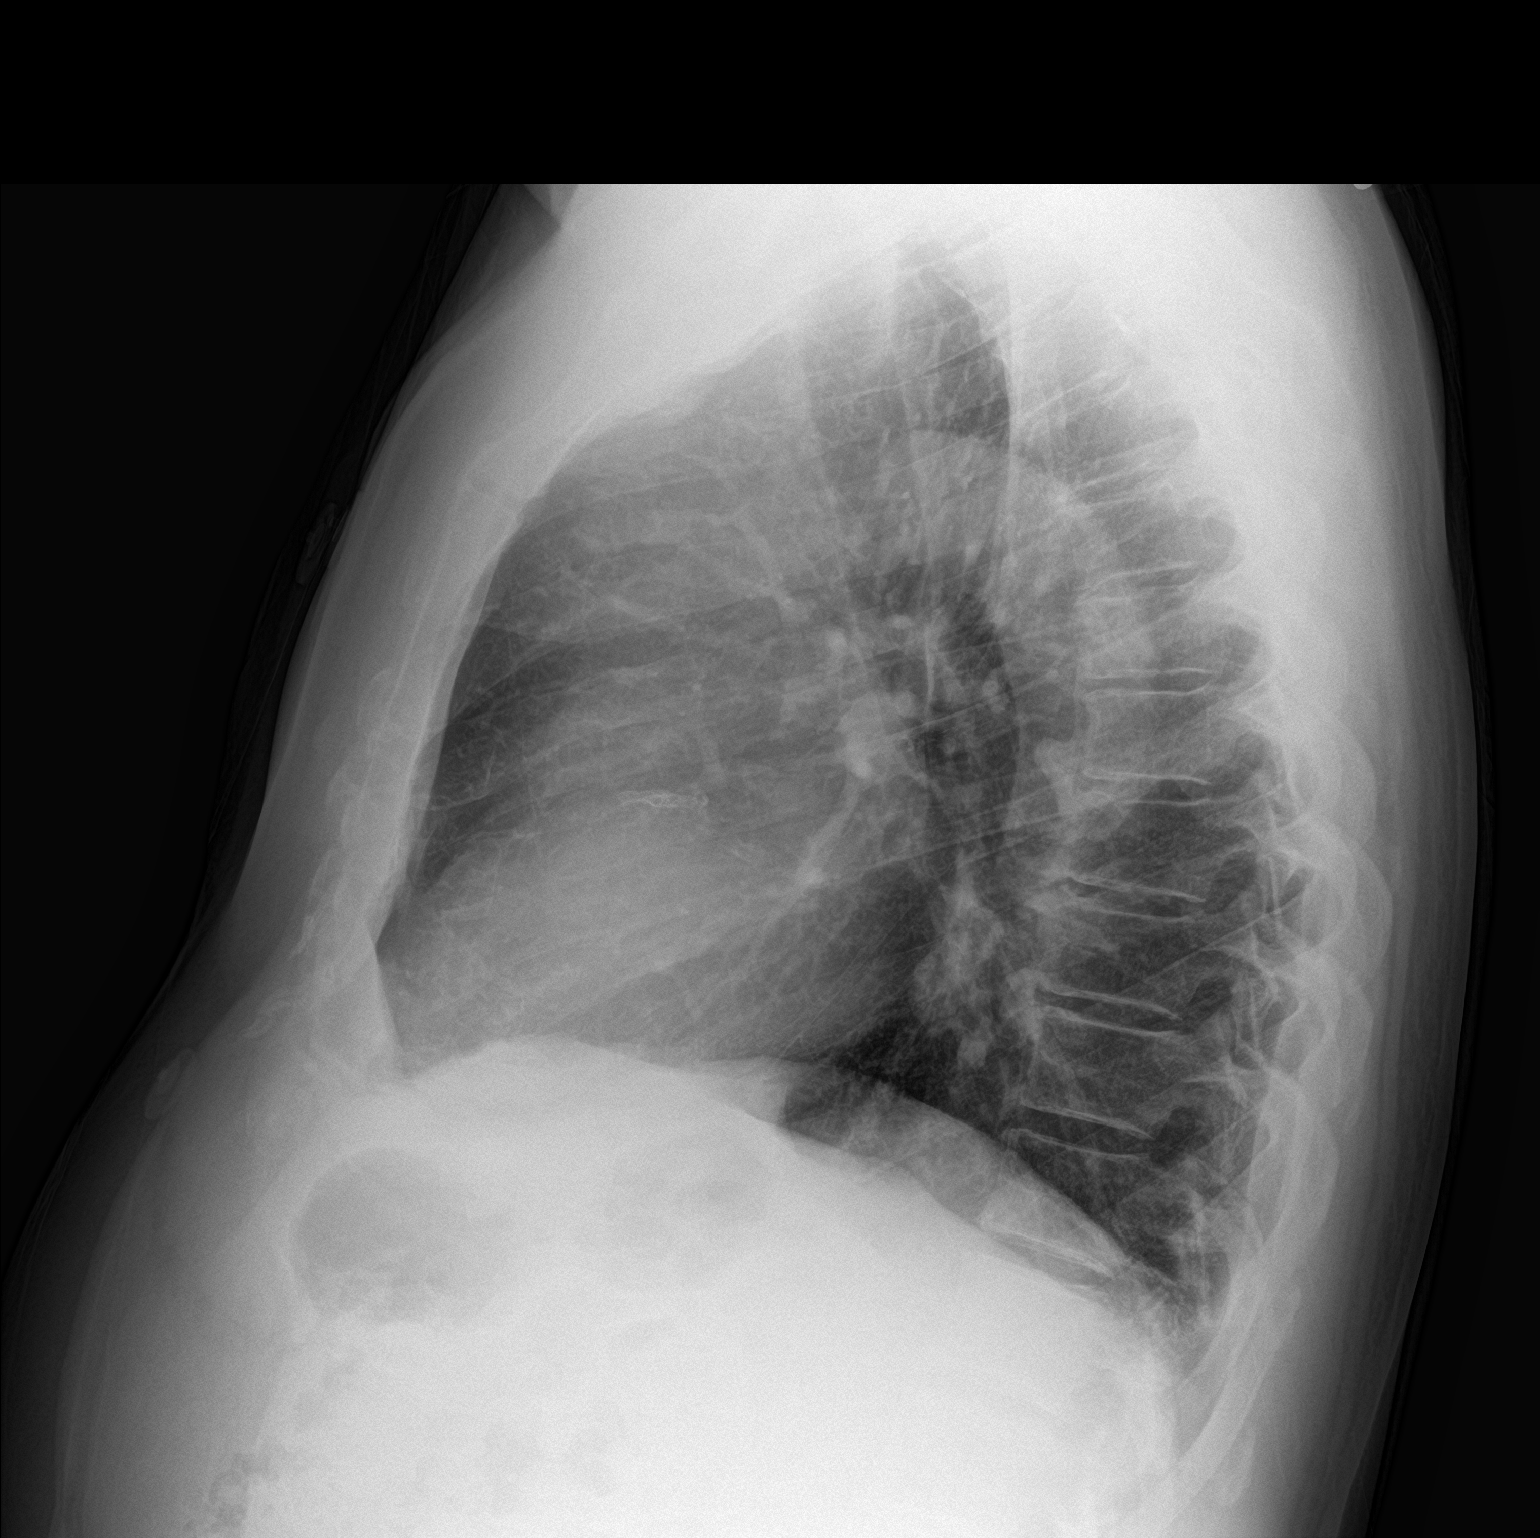

[2 of 2 positions shown; findings below may reference images not displayed]

FINDINGS: The heart size and mediastinal contours are within normal limits.
Mild atherosclerosis of the aortic arch. No aneurysm. Both lungs are
clear. 12 mm central nodular density in the right lower lobe seen by
CT is not apparent radiographically. The visualized skeletal
structures are unremarkable.
IMPRESSION: No active cardiopulmonary disease.  Aortic atherosclerosis.

## 2016-11-10 MED ORDER — DILTIAZEM HCL 100 MG IV SOLR
5.0000 mg/h | INTRAVENOUS | Status: DC
  Administered 2016-11-10 – 2016-11-13 (×5): 5 mg/h via INTRAVENOUS
  Filled 2016-11-10 (×6): qty 100

## 2016-11-10 MED ORDER — ASPIRIN EC 81 MG PO TBEC
81.0000 mg | DELAYED_RELEASE_TABLET | Freq: Every day | ORAL | Status: DC
  Administered 2016-11-11 – 2016-11-17 (×7): 81 mg via ORAL
  Filled 2016-11-10 (×7): qty 1

## 2016-11-10 MED ORDER — SODIUM CHLORIDE 0.9 % IV SOLN: 250.0000 mL | INTRAVENOUS | Status: DC | PRN

## 2016-11-10 MED ORDER — ADULT MULTIVITAMIN W/MINERALS CH
1.0000 | ORAL_TABLET | Freq: Every day | ORAL | Status: DC
  Administered 2016-11-11 – 2016-11-17 (×7): 1 via ORAL
  Filled 2016-11-10 (×7): qty 1

## 2016-11-10 MED ORDER — NITROGLYCERIN 0.4 MG SL SUBL: 0.4000 mg | SUBLINGUAL_TABLET | SUBLINGUAL | Status: DC | PRN

## 2016-11-10 MED ORDER — ACETAMINOPHEN 325 MG PO TABS: 650.0000 mg | ORAL_TABLET | ORAL | Status: DC | PRN

## 2016-11-10 MED ORDER — DILTIAZEM LOAD VIA INFUSION
20.0000 mg | Freq: Once | INTRAVENOUS | Status: AC
  Administered 2016-11-10: 20 mg via INTRAVENOUS
  Filled 2016-11-10: qty 20

## 2016-11-10 MED ORDER — ALPRAZOLAM 0.25 MG PO TABS
0.2500 mg | ORAL_TABLET | Freq: Two times a day (BID) | ORAL | Status: DC | PRN
  Administered 2016-11-11 (×2): 0.25 mg via ORAL
  Filled 2016-11-10 (×2): qty 1

## 2016-11-10 MED ORDER — NIACIN 100 MG PO TABS
100.0000 mg | ORAL_TABLET | Freq: Two times a day (BID) | ORAL | Status: DC
  Administered 2016-11-11 – 2016-11-17 (×13): 100 mg via ORAL
  Filled 2016-11-10 (×16): qty 1

## 2016-11-10 MED ORDER — APIXABAN 5 MG PO TABS
5.0000 mg | ORAL_TABLET | Freq: Two times a day (BID) | ORAL | Status: DC
  Administered 2016-11-10 – 2016-11-11 (×2): 5 mg via ORAL
  Filled 2016-11-10 (×2): qty 1

## 2016-11-10 MED ORDER — OMEGA-3-ACID ETHYL ESTERS 1 G PO CAPS
2.0000 g | ORAL_CAPSULE | Freq: Every day | ORAL | Status: DC
  Administered 2016-11-11 – 2016-11-13 (×3): 2 g via ORAL
  Filled 2016-11-10 (×3): qty 2

## 2016-11-10 MED ORDER — METOPROLOL TARTRATE 50 MG PO TABS
100.0000 mg | ORAL_TABLET | Freq: Two times a day (BID) | ORAL | Status: DC
  Administered 2016-11-10 – 2016-11-13 (×6): 100 mg via ORAL
  Filled 2016-11-10 (×6): qty 2

## 2016-11-10 MED ORDER — LOSARTAN POTASSIUM 50 MG PO TABS
100.0000 mg | ORAL_TABLET | Freq: Every day | ORAL | Status: DC
  Administered 2016-11-11 – 2016-11-17 (×7): 100 mg via ORAL
  Filled 2016-11-10 (×7): qty 2

## 2016-11-10 MED ORDER — ZOLPIDEM TARTRATE 5 MG PO TABS
5.0000 mg | ORAL_TABLET | Freq: Every evening | ORAL | Status: DC | PRN
  Administered 2016-11-10: 5 mg via ORAL
  Filled 2016-11-10 (×2): qty 1

## 2016-11-10 MED ORDER — ONDANSETRON HCL 4 MG/2ML IJ SOLN: 4.0000 mg | Freq: Four times a day (QID) | INTRAMUSCULAR | Status: DC | PRN

## 2016-11-10 NOTE — Progress Notes (Signed)
A troponin of 0.03 was called by labs. Cardiology notified.

## 2016-11-10 NOTE — ED Triage Notes (Signed)
Pt with chest pain on exertion for 1 week that increased yesterday and improved with nitro.  Afib with rate of 129 in triage.  States sob, but denies nausea.

## 2016-11-10 NOTE — ED Notes (Signed)
Sandwich and graham crackers given per RN's request.

## 2016-11-10 NOTE — ED Notes (Signed)
Attempted report. RN to call back 

## 2016-11-10 NOTE — ED Provider Notes (Signed)
[place] DEPT Provider Note   CSN: [id number] Arrival date & time: 11/10/16  1405     History   Chief Complaint Chief Complaint  Patient presents with  . Chest Pain    HPI Troy [name] is a 66 y.o. male.  Patient with hx cad, afib, c/o being in afib for the past several days. States saw his cardiologist 4 days ago, was given rx for 2 meds for afib, but states he could not afford them. States afib persists and in past few days is having recurrent chest pain. He describes it as being same as his prior heart related pain, hx cad.  States w minimal exertion he gets mid chest discomfort, and increased sob. No nv. No diaphoresis. Symptoms last several minutes duration.    The history is provided by the patient.  Chest Pain      Past Medical History:  Diagnosis Date  . CAD (coronary artery disease)    a. s/p PCI in 2010 in [place]. b. Unstable angina/LHC A999333 complicated by V fib arrest after contrast injection. Cath 06/19/2015 s/p DES to mid LAD and RPDA.  Marland Kitchen Dyslipidemia   . Essential hypertension   . Lung nodule < 6cm on CT 06/16/15   Right lung  . Mitral regurgitation    a. Mild-mod by echo 06/2015.  Marland Kitchen PAF (paroxysmal atrial fibrillation) ()    a. 06/2015 noted to be in new a-fib when arrived with unstable angina. Converted to NSR after being shocked in the cath lab for vfib;  b. 06/2015 Eliquis initiated as PAF noted on event monitor.  . Pulmonary nodule 07/2015   a. 1.5 x 1.2 cm smoothly marginated nodule in the central aspect of the right lower lobe with recommendation correlation with nonemergent PET-CT to exclude a neoplasm , stable by CT 07/2016  . Sleep apnea, obstructive   . Ventricular fibrillation ([place])    a. occured during cath 06/18/2015.    Patient Active Problem List   Diagnosis Date Noted  . Environmental and seasonal allergies 07/06/2015  . Pulmonary nodule 06/27/2015  . Mitral regurgitation   . Ventricular fibrillation ([place])   . Dyslipidemia  06/18/2015  . CAD S/P percutaneous coronary angioplasty 06/17/2015  . A-fib ([place]) 06/16/2015  . Essential (primary) hypertension 04/11/2015  . Abnormal presence of protein in urine 04/11/2015    Past Surgical History:  Procedure Laterality Date  . CARDIAC CATHETERIZATION N/A 06/18/2015   Procedure: Left Heart Cath and Coronary Angiography;  Surgeon: Burnell Blanks, MD;  Location: [place] [facility];  Service: Cardiovascular;  Laterality: N/A;  . CARDIAC CATHETERIZATION N/A 06/19/2015   Procedure: Coronary Stent Intervention;  Surgeon: Peter M Martinique, MD;  Location: [place] [facility];  Service: Cardiovascular;  Laterality: N/A;  . CORONARY ANGIOPLASTY WITH STENT PLACEMENT    . REPLACEMENT TOTAL KNEE Left        Home Medications    Prior to Admission medications   Medication Sig Start Date End Date Taking? Authorizing Provider  apixaban (ELIQUIS) 5 MG TABS tablet Take 5 mg by mouth 2 (two) times daily.   Yes Historical Provider, MD  losartan (COZAAR) 100 MG tablet Take 1 tablet (100 mg total) by mouth daily. 10/09/16  Yes Sueanne Margarita, MD  metoprolol (LOPRESSOR) 100 MG tablet TAKE 1 TABLET BY MOUTH TWICE A DAY 10/17/16  Yes Sueanne Margarita, MD  Multiple Vitamin (MULTIVITAMIN WITH MINERALS) TABS tablet Take 1 tablet by mouth daily.   Yes Historical Provider, MD  niacin  100 MG tablet Take 100 mg by mouth 2 (two) times daily with a meal.   Yes Historical Provider, MD  nitroGLYCERIN (NITROSTAT) 0.4 MG SL tablet Place 1 tablet (0.4 mg total) under the tongue every 5 (five) minutes as needed for chest pain. 11/06/16  Yes Rhonda G Barrett, PA-C  Omega-3 Fatty Acids (FISH OIL) 1000 MG CPDR Take 4,000 mg by mouth daily.    Yes Historical Provider, MD  oxymetazoline (AFRIN) 0.05 % nasal spray Place 1 spray into both nostrils 2 (two) times daily as needed for congestion.   Yes Historical Provider, MD  diltiazem (CARDIZEM LA) 120 MG 24 hr tablet Take 1 tablet (120 mg total) by mouth  daily. Patient not taking: Reported on 11/10/2016 11/06/16   Evelene Croon Barrett, PA-C  warfarin (COUMADIN) 5 MG tablet Take 1 tablet (5 mg total) by mouth daily. Patient not taking: Reported on 11/10/2016 11/07/16   Evelene Croon Barrett, PA-C    Family History Family History  Problem Relation Age of Onset  . Stroke Mother   . Emphysema Mother   . Lung cancer Mother   . Heart attack Father   . Drug abuse Father   . Heart attack Maternal Grandfather   . Heart attack Paternal Grandfather   . Diabetes Sister     Social History Social History  Substance Use Topics  . Smoking status: Never Smoker  . Smokeless tobacco: Never Used     Comment: Second-hand exposure through parents  . Alcohol use 0.0 oz/week     Comment: Social EtOH     Allergies   Gluten meal; Tizanidine; Whey; and Morphine and related   Review of Systems Review of Systems  Cardiovascular: Positive for chest pain.     Physical Exam Updated Vital Signs BP 102/69   Pulse 93   Temp 98.4 F (36.9 C) (Oral)   Resp (!) 27   Ht 5\' 11"  (1.803 m)   Wt 110.7 kg   SpO2 95%   BMI 34.03 kg/m   Physical Exam  Constitutional: He is oriented to person, place, and time. He appears well-developed and well-nourished. No distress.  HENT:  Mouth/Throat: Oropharynx is clear and moist.  Eyes: Conjunctivae are normal.  Neck: Neck supple. No tracheal deviation present.  Cardiovascular: Normal heart sounds and intact distal pulses.  Exam reveals no gallop and no friction rub.   No murmur heard. Tachycardic, irregular.   Pulmonary/Chest: Effort normal and breath sounds normal. No accessory muscle usage. No respiratory distress.  Abdominal: Soft. Bowel sounds are normal. He exhibits no distension. There is no tenderness.  Musculoskeletal: He exhibits no edema or tenderness.  Neurological: He is alert and oriented to person, place, and time.  Skin: Skin is warm and dry. He is not diaphoretic.  Psychiatric: He has a normal mood and  affect.  Nursing note and vitals reviewed.    ED Treatments / Results  Labs (all labs ordered are listed, but only abnormal results are displayed) Results for orders placed or performed during the hospital encounter of 0000000  Basic metabolic panel  Result Value Ref Range   Sodium 140 135 - 145 mmol/L   Potassium 4.6 3.5 - 5.1 mmol/L   Chloride 107 101 - 111 mmol/L   CO2 24 22 - 32 mmol/L   Glucose, Bld 98 65 - 99 mg/dL   BUN 26 (H) 6 - 20 mg/dL   Creatinine, Ser 1.20 0.61 - 1.24 mg/dL   Calcium 9.4 8.9 - 10.3 mg/dL  GFR calc non Af Amer >60 >60 mL/min   GFR calc Af Amer >60 >60 mL/min   Anion gap 9 5 - 15  CBC  Result Value Ref Range   WBC 7.0 4.0 - 10.5 K/uL   RBC 5.00 4.22 - 5.81 MIL/uL   Hemoglobin 14.8 13.0 - 17.0 g/dL   HCT 43.4 39.0 - 52.0 %   MCV 86.8 78.0 - 100.0 fL   MCH 29.6 26.0 - 34.0 pg   MCHC 34.1 30.0 - 36.0 g/dL   RDW 13.2 11.5 - 15.5 %   Platelets 156 150 - 400 K/uL  I-Stat Troponin, ED - 0, 3, 6 hours (not at [facility])  Result Value Ref Range   Troponin i, poc 0.01 0.00 - 0.08 ng/mL   Comment 3           Dg Chest 2 View  Result Date: 11/10/2016 CLINICAL DATA:  Atrial fibrillation x1 month with left upper chest pain and dyspnea times 3-4 days. EXAM: CHEST  2 VIEW COMPARISON:  07/25/2016 CT FINDINGS: The heart size and mediastinal contours are within normal limits. Mild atherosclerosis of the aortic arch. No aneurysm. Both lungs are clear. 12 mm central nodular density in the right lower lobe seen by CT is not apparent radiographically. The visualized skeletal structures are unremarkable. IMPRESSION: No active cardiopulmonary disease.  Aortic atherosclerosis. Electronically Signed   By: Ashley Royalty M.D.   On: 11/10/2016 15:57    EKG  EKG Interpretation  Date/Time:  Monday November 10 2016 14:09:36 EST Ventricular Rate:  129 PR Interval:    QRS Duration: 98 QT Interval:  328 QTC Calculation: 480 R Axis:   52 Text Interpretation:  Atrial fibrillation  with rapid ventricular response Non-specific ST-t changes Confirmed by Ashok Cordia  MD, Lennette Bihari ([id number]) on 11/10/2016 3:57:46 PM       Radiology Dg Chest 2 View  Result Date: 11/10/2016 CLINICAL DATA:  Atrial fibrillation x1 month with left upper chest pain and dyspnea times 3-4 days. EXAM: CHEST  2 VIEW COMPARISON:  07/25/2016 CT FINDINGS: The heart size and mediastinal contours are within normal limits. Mild atherosclerosis of the aortic arch. No aneurysm. Both lungs are clear. 12 mm central nodular density in the right lower lobe seen by CT is not apparent radiographically. The visualized skeletal structures are unremarkable. IMPRESSION: No active cardiopulmonary disease.  Aortic atherosclerosis. Electronically Signed   By: Ashley Royalty M.D.   On: 11/10/2016 15:57    Procedures Procedures (including critical care time)  Medications Ordered in ED Medications  diltiazem (CARDIZEM) 1 mg/mL load via infusion 20 mg (20 mg Intravenous Bolus from Bag 11/10/16 1530)    And  diltiazem (CARDIZEM) 100 mg in dextrose 5 % 100 mL (1 mg/mL) infusion (5 mg/hr Intravenous New Bag/Given 11/10/16 1529)     Initial Impression / Assessment and Plan / ED Course  I have reviewed the triage vital signs and the nursing notes.  Pertinent labs & imaging results that were available during my care of the patient were reviewed by me and considered in my medical decision making (see chart for details).  Clinical Course    Iv ns. Labs.   Reviewed nursing notes and prior charts for additional history.   Ecg. Cxr.  cardizem iv bolus and gtt.  No current cp.  Given recent increased chest pain w minimal exertion, rapid afib, cardiology consulted for admission.  Recheck rate improved. No cp.     Final Clinical Impressions(s) / ED Diagnoses  Final diagnoses:  None    New Prescriptions New Prescriptions   No medications on file     Lajean Saver, MD 11/10/16 1640

## 2016-11-11 ENCOUNTER — Inpatient Hospital Stay (HOSPITAL_COMMUNITY): Payer: Medicare Other

## 2016-11-11 ENCOUNTER — Encounter (HOSPITAL_COMMUNITY): Payer: Self-pay | Admitting: Certified Registered Nurse Anesthetist

## 2016-11-11 DIAGNOSIS — R079 Chest pain, unspecified: Secondary | ICD-10-CM

## 2016-11-11 DIAGNOSIS — I4891 Unspecified atrial fibrillation: Secondary | ICD-10-CM | POA: Diagnosis present

## 2016-11-11 LAB — COMPREHENSIVE METABOLIC PANEL
ALT: 27 U/L (ref 17–63)
AST: 19 U/L (ref 15–41)
Albumin: 3.5 g/dL (ref 3.5–5.0)
Alkaline Phosphatase: 35 U/L — ABNORMAL LOW (ref 38–126)
Anion gap: 7 (ref 5–15)
BUN: 17 mg/dL (ref 6–20)
CO2: 26 mmol/L (ref 22–32)
Calcium: 8.9 mg/dL (ref 8.9–10.3)
Chloride: 107 mmol/L (ref 101–111)
Creatinine, Ser: 0.98 mg/dL (ref 0.61–1.24)
GFR calc Af Amer: >60 mL/min (ref >60)
GFR calc non Af Amer: >60 mL/min (ref >60)
Glucose, Bld: 104 mg/dL — ABNORMAL HIGH (ref 65–99)
Potassium: 4 mmol/L (ref 3.5–5.1)
Sodium: 140 mmol/L (ref 135–145)
Total Bilirubin: 0.6 mg/dL (ref 0.3–1.2)
Total Protein: 6.4 g/dL — ABNORMAL LOW (ref 6.5–8.1)

## 2016-11-11 LAB — MRSA PCR SCREENING: MRSA by PCR: NEGATIVE

## 2016-11-11 LAB — TROPONIN I
Troponin I: 0.03 ng/mL (ref <0.03)
Troponin I: <0.03 ng/mL (ref <0.03)

## 2016-11-11 LAB — NM MYOCAR MULTI W/SPECT W/WALL MOTION / EF
Estimated workload: 1 METS
Exercise duration (min): 6 min
Exercise duration (sec): 13 s
Peak HR: 142 {beats}/min
Rest HR: 100 {beats}/min

## 2016-11-11 MED ORDER — SODIUM CHLORIDE 0.9% FLUSH
3.0000 mL | Freq: Two times a day (BID) | INTRAVENOUS | Status: DC
  Administered 2016-11-11 – 2016-11-16 (×9): 3 mL via INTRAVENOUS

## 2016-11-11 MED ORDER — SODIUM CHLORIDE 0.9 % IV SOLN: 250.0000 mL | INTRAVENOUS | Status: DC

## 2016-11-11 MED ORDER — SODIUM CHLORIDE 0.9% FLUSH: 3.0000 mL | INTRAVENOUS | Status: DC | PRN

## 2016-11-11 MED ORDER — TECHNETIUM TC 99M TETROFOSMIN IV KIT
10.0000 | PACK | Freq: Once | INTRAVENOUS | Status: AC | PRN
  Administered 2016-11-11: 10 via INTRAVENOUS

## 2016-11-11 MED ORDER — HEPARIN (PORCINE) IN NACL 100-0.45 UNIT/ML-% IJ SOLN
1400.0000 [IU]/h | INTRAMUSCULAR | Status: DC
  Administered 2016-11-11: 1800 [IU]/h via INTRAVENOUS
  Administered 2016-11-12: 1700 [IU]/h via INTRAVENOUS
  Administered 2016-11-13: 1600 [IU]/h via INTRAVENOUS
  Filled 2016-11-11 (×3): qty 250

## 2016-11-11 MED ORDER — REGADENOSON 0.4 MG/5ML IV SOLN
INTRAVENOUS | Status: AC
  Administered 2016-11-11: 08:00:00
  Filled 2016-11-11: qty 5

## 2016-11-11 MED ORDER — SODIUM CHLORIDE 0.9 % IV SOLN: INTRAVENOUS | Status: DC

## 2016-11-11 MED ORDER — AMINOPHYLLINE 25 MG/ML IV SOLN
INTRAVENOUS | Status: AC
  Filled 2016-11-11: qty 10

## 2016-11-11 MED ORDER — TECHNETIUM TC 99M TETROFOSMIN IV KIT
30.0000 | PACK | Freq: Once | INTRAVENOUS | Status: AC | PRN
  Administered 2016-11-11: 30 via INTRAVENOUS

## 2016-11-11 MED ORDER — NITROGLYCERIN 0.4 MG SL SUBL
SUBLINGUAL_TABLET | SUBLINGUAL | Status: AC
  Filled 2016-11-11: qty 1

## 2016-11-11 MED ORDER — REGADENOSON 0.4 MG/5ML IV SOLN
0.4000 mg | Freq: Once | INTRAVENOUS | Status: AC
  Administered 2016-11-11: 0.4 mg via INTRAVENOUS

## 2016-11-11 NOTE — Progress Notes (Addendum)
Stress test high risk with small ischemia at the apex. Needs cath. Discussed with Dr. Radford Pax. D/C Eliquis (last dose AM of 11/11/16). Heparin per pharmacy. Cancel TEE/DCCV tomorrow. Plan for cath Thursday 11/13/16  with Dr. Ellyn Hack at [facility].   Will need cath order tomorrow.

## 2016-11-11 NOTE — Progress Notes (Addendum)
Patient presented for Lexiscan. Tolerated procedure well. Pending final stress imaging result.   CHA2DS2VASc=3 (age x 1, CAD, HTN)

## 2016-11-11 NOTE — Progress Notes (Signed)
   11/11/16 1100  Clinical Encounter Type  Visited With Patient  Visit Type Initial  Referral From Nurse  Consult/Referral To Chaplain  Stress Factors  Patient Stress Factors None identified    Chaplain responded to [place] consult for AD dropped off ad paperwork and gave pager info. Will follow up this afternoon if pt or family has further questions [phone].

## 2016-11-11 NOTE — Progress Notes (Signed)
Patient Name: Troy [name] Date of Encounter: 11/11/2016  Primary Cardiologist: Dr. Sundra Aland Problem List     Active Problems:   Chest pain with moderate risk of acute coronary syndrome     Subjective   Seen in nuc med. Pending H&P. Noted in afib on cardizem. No complains.   Inpatient Medications    Scheduled Meds: . aminophylline      . nitroGLYCERIN      . regadenoson      . apixaban  5 mg Oral BID  . aspirin EC  81 mg Oral Daily  . losartan  100 mg Oral Daily  . metoprolol  100 mg Oral BID  . multivitamin with minerals  1 tablet Oral Daily  . niacin  100 mg Oral BID WC  . omega-3 acid ethyl esters  2 g Oral Daily   Continuous Infusions: . diltiazem (CARDIZEM) infusion 5 mg/hr (11/11/16 0237)   PRN Meds: sodium chloride, acetaminophen, ALPRAZolam, nitroGLYCERIN, ondansetron (ZOFRAN) IV, zolpidem   Vital Signs    Vitals:   11/11/16 0906 11/11/16 0907 11/11/16 0908 11/11/16 0910  BP: (!) 138/98 (!) 142/96 (!) 131/93   Pulse: (!) 136 (!) 133 (!) 133 (!) 116  Resp:      Temp:      TempSrc:      SpO2:      Weight:      Height:        Intake/Output Summary (Last 24 hours) at 11/11/16 0911 Last data filed at 11/11/16 0900  Gross per 24 hour  Intake               40 ml  Output              130 ml  Net              -90 ml   Filed Weights   11/10/16 1416  Weight: 244 lb (110.7 kg)    Physical Exam   GEN: Well nourished, well developed, in no acute distress.  HEENT: Grossly normal.  Neck: Supple, no JVD, carotid bruits, or masses. Cardiac: Ir Ir, no murmurs, rubs, or gallops. No clubbing, cyanosis, edema.  Radials/DP/PT 2+ and equal bilaterally.  Respiratory:  Respirations regular and unlabored, clear to auscultation bilaterally. GI: Soft, nontender, nondistended, BS + x 4. MS: no deformity or atrophy. Skin: warm and dry, no rash. Neuro:  Strength and sensation are intact. Psych: AAOx3.  Normal affect.  Labs    CBC  Recent Labs  11/10/16 1416  WBC 7.0  HGB 14.8  HCT 43.4  MCV 86.8  PLT A999333   Basic Metabolic Panel  Recent Labs  11/10/16 1416 11/11/16 0704  NA 140 140  K 4.6 4.0  CL 107 107  CO2 24 26  GLUCOSE 98 104*  BUN 26* 17  CREATININE 1.20 0.98  CALCIUM 9.4 8.9   Liver Function Tests  Recent Labs  11/11/16 0704  AST 19  ALT 27  ALKPHOS 35*  BILITOT 0.6  PROT 6.4*  ALBUMIN 3.5   No results for input(s): LIPASE, AMYLASE in the last 72 hours. Cardiac Enzymes  Recent Labs  11/10/16 1947 11/11/16 0035 11/11/16 0704  TROPONINI 0.03* 0.03* <0.03   BNP Invalid input(s): POCBNP D-Dimer No results for input(s): DDIMER in the last 72 hours. Hemoglobin A1C No results for input(s): HGBA1C in the last 72 hours. Fasting Lipid Panel No results for input(s): CHOL, HDL, LDLCALC, TRIG, CHOLHDL, LDLDIRECT in the last 72 hours.  Thyroid Function Tests  Recent Labs  11/10/16 1947  TSH 1.090    Telemetry    Unable to review as patient seen in nuc med   ECG    N/A  Radiology    Dg Chest 2 View  Result Date: 11/10/2016 CLINICAL DATA:  Atrial fibrillation x1 month with left upper chest pain and dyspnea times 3-4 days. EXAM: CHEST  2 VIEW COMPARISON:  07/25/2016 CT FINDINGS: The heart size and mediastinal contours are within normal limits. Mild atherosclerosis of the aortic arch. No aneurysm. Both lungs are clear. 12 mm central nodular density in the right lower lobe seen by CT is not apparent radiographically. The visualized skeletal structures are unremarkable. IMPRESSION: No active cardiopulmonary disease.  Aortic atherosclerosis. Electronically Signed   By: Ashley Royalty M.D.   On: 11/10/2016 15:57    Cardiac Studies   Pending echo  Patient Profile     Troy [name] is a 66 y.o. male with a history of 06/2015 DES mLAD & RPDA w/ VF arrest, PCI in [place] 2010, HTN, HLD, MR, PAF, not anticoagulated, pulm nodule stable 07/2016 who seen in clinic for atrial flutter 11/06/16 noted in atrial  flutter and started in cardizem CD 120 however not started due to code who came to ER 11/11/15 for chest pain and being admitted.   Assessment & Plan    1. Afib RVR - rate of 90 however with lexiscan rate increased to 120-130s. Increased cardizem to 7.20ml/hr. Continue Xarelto. Pending echo.  2. Chest pain - resolved. Troponin trend of 0.03-->0.03--><0.03. Pending nuc result.   Signed, Leanor Kail, PA  11/11/2016, 9:11 AM

## 2016-11-11 NOTE — H&P (Signed)
Admit date: 11/10/2016 Referring Physician: Dr. Ashok Cordia Primary Cardiologist: Dr. Radford Pax Chief complaint/reason for admission:chest pain and afib with RVR  HPI: Troy [name] is a 66 y.o. male with a history of 06/2015 DES mLAD & RPDA w/ VF arrest, PCI in [place] 2010, HTN, HLD, MR, PAF (not anticoagulated in the past), pulm nodule stable 07/2016.  Seen 12/28 in office w/ irreg HR and thought he had been in afib for a few days.  At that time he was seen by the extender and stated that he does cardio 30 min/day, tries to get 15,000 steps/day and he does not get chest pain or SOB w/ exertion. A month ago, on the exercise bike, his HR would not register. At other times, his HR would be 150 or 170. His Fitbit apparently does not read the HR accurately with the afib. 6 months ago, he thought he was in atrial fib, he rested and symptoms went away. He had a foot injury and was unable to exercise for 4-5 months. He gained weight and fitness went down. He has been exercising and eating better and has lost from 250 lbs down to current weight. His previous afib was very symptomatic, and in the setting of untreated HTN/CAD. This has not been the case this time. He has not really felt DOE, no activity limitations. He noticed a decreased fitness level, but attributed that to his layoff after foot injury.  He was found to be in afib with RVR at that OV and started on CCB and Eliquis.    He says that he could not afford the Cardizem but started the Eliquis.  He presented to the ER yesterday due th recurrent chest pain off and on since his OV.  He has never had pain like this and it comes and goes.  It is related to exertion some.  He says that he only had SOB with his CAD in the past.  He gets the discomfort with minimal exertion.  He has noticed some increased SOB but no nausea or diaphoresis.  In ER found to be in afib with RVR and Cardiology is now asked to consult.  Initial trop 0.03 and POC trop 0.01.  Labs otherwise  normal.  EKG with afib with RVR and nonspecific ST abnormality  PMH:    Past Medical History:  Diagnosis Date  . CAD (coronary artery disease)    a. s/p PCI in 2010 in [place]. b. Unstable angina/LHC A999333 complicated by V fib arrest after contrast injection. Cath 06/19/2015 s/p DES to mid LAD and RPDA.  Marland Kitchen Dyslipidemia   . Essential hypertension   . Lung nodule < 6cm on CT 06/16/15   Right lung  . Mitral regurgitation    a. Mild-mod by echo 06/2015.  Marland Kitchen PAF (paroxysmal atrial fibrillation) ([place])    a. 06/2015 noted to be in new a-fib when arrived with unstable angina. Converted to NSR after being shocked in the cath lab for vfib;  b. 06/2015 Eliquis initiated as PAF noted on event monitor.  . Pulmonary nodule 07/2015   a. 1.5 x 1.2 cm smoothly marginated nodule in the central aspect of the right lower lobe with recommendation correlation with nonemergent PET-CT to exclude a neoplasm , stable by CT 07/2016  . Sleep apnea, obstructive   . Ventricular fibrillation ([place])    a. occured during cath 06/18/2015.    PSH:    Past Surgical History:  Procedure Laterality Date  . CARDIAC CATHETERIZATION N/A 06/18/2015   Procedure:  Left Heart Cath and Coronary Angiography;  Surgeon: Burnell Blanks, MD;  Location: [facility];  Service: Cardiovascular;  Laterality: N/A;  . CARDIAC CATHETERIZATION N/A 06/19/2015   Procedure: Coronary Stent Intervention;  Surgeon: Peter M Martinique, MD;  Location: [facility];  Service: Cardiovascular;  Laterality: N/A;  . CORONARY ANGIOPLASTY WITH STENT PLACEMENT    . REPLACEMENT TOTAL KNEE Left     ALLERGIES:   Gluten meal; Tizanidine; Whey; and Morphine and related  Prior to Admit Meds:   Prescriptions Prior to Admission  Medication Sig Dispense Refill Last Dose  . apixaban (ELIQUIS) 5 MG TABS tablet Take 5 mg by mouth 2 (two) times daily.   11/10/2016 at 0900  . losartan (COZAAR) 100 MG tablet Take 1 tablet (100 mg total) by mouth daily. 30 tablet 1  11/10/2016 at Unknown time  . metoprolol (LOPRESSOR) 100 MG tablet TAKE 1 TABLET BY MOUTH TWICE A DAY 60 tablet 1 11/10/2016 at 0900  . Multiple Vitamin (MULTIVITAMIN WITH MINERALS) TABS tablet Take 1 tablet by mouth daily.   11/10/2016 at Unknown time  . niacin 100 MG tablet Take 100 mg by mouth 2 (two) times daily with a meal.   11/10/2016 at Unknown time  . nitroGLYCERIN (NITROSTAT) 0.4 MG SL tablet Place 1 tablet (0.4 mg total) under the tongue every 5 (five) minutes as needed for chest pain. 25 tablet 3 11/10/2016 at Unknown time  . Omega-3 Fatty Acids (FISH OIL) 1000 MG CPDR Take 4,000 mg by mouth daily.    11/10/2016 at Unknown time  . oxymetazoline (AFRIN) 0.05 % nasal spray Place 1 spray into both nostrils 2 (two) times daily as needed for congestion.   11/09/2016 at Unknown time  . diltiazem (CARDIZEM LA) 120 MG 24 hr tablet Take 1 tablet (120 mg total) by mouth daily. (Patient not taking: Reported on 11/10/2016) 90 tablet 3 Not Taking at Unknown time  . warfarin (COUMADIN) 5 MG tablet Take 1 tablet (5 mg total) by mouth daily. (Patient not taking: Reported on 11/10/2016) 30 tablet 0 Not Taking at Unknown time   Family HX:    Family History  Problem Relation Age of Onset  . Stroke Mother   . Emphysema Mother   . Lung cancer Mother   . Heart attack Father   . Drug abuse Father   . Heart attack Maternal Grandfather   . Heart attack Paternal Grandfather   . Diabetes Sister    Social HX:    Social History   Social History  . Marital status: Married    Spouse name: N/A  . Number of children: N/A  . Years of education: N/A   Occupational History  . Not on file.   Social History Main Topics  . Smoking status: Never Smoker  . Smokeless tobacco: Never Used     Comment: Second-hand exposure through parents  . Alcohol use 0.0 oz/week     Comment: Social EtOH  . Drug use: No  . Sexual activity: Not on file   Other Topics Concern  . Not on file   Social History Narrative   Originally he  is from [place], [place]. He moved to [facility] in 2013. He has a dog & cat at home. No bird or mold exposure. Has a hot tub but hasn't used it in 4 months. 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      ROS:  All 11 ROS were addressed and are negative except what is stated in the  HPI  PHYSICAL EXAM Vitals:   11/11/16 0914 11/11/16 1028  BP:  126/81  Pulse: (!) 124 (!) 109  Resp:    Temp:     General: Well developed, well nourished, in no acute distress Head: Eyes PERRLA, No xanthomas.   Normal cephalic and atramatic  Lungs:   Clear bilaterally to auscultation and percussion. Heart:   Irregularly irregular S1 S2 Pulses are 2+ & equal.            No carotid bruit. No JVD.  No abdominal bruits. No femoral bruits. Abdomen: Bowel sounds are positive, abdomen soft and non-tender without masses or                  Hernia's noted. Msk:  Back normal, normal gait Neuro Alert and oriented x 3 Psychiatry - normal affect  EKG:  Atrial fibrillation with RVR and nonspecific ST abnormality  ASSESSMENT/PLAN:   1.  Persistent atrial fibrillation with RVR - will continue on IV Cardizem gtt.  Continue Eliquis.  Titrate cardizem for rate control.  Continue lopressor 100mg  PO BID.  Check TSH.  2.  Chest pain that sounds somewhat atypical and he has not had CP in the past with his CAD.  His anginal equivalent is SOB which has not really increased much recently.  Trop neg.  EKG nonischemic.  Will cycle enzymes and if neg then NPO after MN for nuclear stress test in am.    3.  ASCAD with history of cath 06/2015 with DES mLAD & RPDA w/ VF arrest after contrast dye, PCI in [place] 2010.  His typical anginal equivalent is SOB not CP.  4.  HTN - BP borderline controlled.  Continue lopressor 100mg  BID and losartan 100mg  daily.  5.  Hyperlipidemia - LDL goal < 70.   Check FLP.  He is currently not on a statin. He was on high dose lipitor as last OV with me.  Will check med rec.  6.  Mild to moderate MR by echo 06/2016  Fransico Him, MD  11/11/2016  10:30 AM

## 2016-11-11 NOTE — Progress Notes (Signed)
ANTICOAGULATION CONSULT NOTE - Initial Consult  Pharmacy Consult for apixaban>>heparin Indication: chest pain/ACS and atrial fibrillation  Allergies  Allergen Reactions  . Gluten Meal Other (See Comments)    REACTION: Celiac disease  . Tizanidine Anxiety    Depression  . Whey Other (See Comments)    REACTION: Celiac disease  . Morphine And Related Other (See Comments)    REACTION: "REALLY BAD STOMACH PAIN"    Patient Measurements: Height: 5\' 11"  (180.3 cm) Weight: 244 lb (110.7 kg) IBW/kg (Calculated) : 75.3 Heparin Dosing Weight: 100kg  Vital Signs: Temp: 97.6 F (36.4 C) (01/02 1500) Temp Source: Oral (01/02 1500) BP: 115/68 (01/02 1500) Pulse Rate: 93 (01/02 1500)  Labs:  Recent Labs  11/10/16 1416 11/10/16 1947 11/11/16 0035 11/11/16 0704  HGB 14.8  --   --   --   HCT 43.4  --   --   --   PLT 156  --   --   --   CREATININE 1.20  --   --  0.98  TROPONINI  --  0.03* 0.03* <0.03    Estimated Creatinine Clearance: 92.6 mL/min (by C-G formula based on SCr of 0.98 mg/dL).   Medical History: Past Medical History:  Diagnosis Date  . CAD (coronary artery disease)    a. s/p PCI in 2010 in [place]. b. Unstable angina/LHC A999333 complicated by V fib arrest after contrast injection. Cath 06/19/2015 s/p DES to mid LAD and RPDA.  Marland Kitchen Dyslipidemia   . Essential hypertension   . Lung nodule < 6cm on CT 06/16/15   Right lung  . Mitral regurgitation    a. Mild-mod by echo 06/2015.  Marland Kitchen PAF (paroxysmal atrial fibrillation) ([place])    a. 06/2015 noted to be in new a-fib when arrived with unstable angina. Converted to NSR after being shocked in the cath lab for vfib;  b. 06/2015 Eliquis initiated as PAF noted on event monitor.  . Pulmonary nodule 07/2015   a. 1.5 x 1.2 cm smoothly marginated nodule in the central aspect of the right lower lobe with recommendation correlation with nonemergent PET-CT to exclude a neoplasm , stable by CT 07/2016  . Sleep apnea, obstructive   .  Ventricular fibrillation ([place])    a. occured during cath 06/18/2015.  Assessment: 66 year old male with history of afib on apixaban, dose given this morning. Now with positive stress test this afternoon, planning cath after apixaban washout on Thursday. Will start heparin tonight and check aptt/HL in am.   Goal of Therapy:  Heparin level 0.3-0.7 units/ml  aptt goal 66-102s Monitor platelets by anticoagulation protocol: Yes   Plan:  Start heparin at 1800 units/hr tonight (has required higher doses previous admit) Aptt/heparin level in am  Erin Hearing PharmD., BCPS Clinical Pharmacist Pager [phone] 11/11/2016 4:04 PM

## 2016-11-12 ENCOUNTER — Encounter (HOSPITAL_COMMUNITY)
Admission: EM | Disposition: A | Discharge status: Home / Self Care | Payer: Self-pay | Source: Home / Self Care | Attending: Cardiology

## 2016-11-12 DIAGNOSIS — I200 Unstable angina: Secondary | ICD-10-CM | POA: Insufficient documentation

## 2016-11-12 LAB — CBC
HEMATOCRIT: 42.2 % (ref 39.0–52.0)
Hemoglobin: 14.2 g/dL (ref 13.0–17.0)
MCH: 29.4 pg (ref 26.0–34.0)
MCHC: 33.6 g/dL (ref 30.0–36.0)
MCV: 87.4 fL (ref 78.0–100.0)
Platelets: 156 10*3/uL (ref 150–400)
RBC: 4.83 MIL/uL (ref 4.22–5.81)
RDW: 13.3 % (ref 11.5–15.5)
WBC: 5.7 10*3/uL (ref 4.0–10.5)

## 2016-11-12 LAB — HEPARIN LEVEL (UNFRACTIONATED): HEPARIN UNFRACTIONATED: 1.38 [IU]/mL — AB (ref 0.30–0.70)

## 2016-11-12 LAB — APTT
APTT: 109 s — AB (ref 24–36)
APTT: 112 s — AB (ref 24–36)

## 2016-11-12 SURGERY — ECHOCARDIOGRAM, TRANSESOPHAGEAL
Anesthesia: Moderate Sedation

## 2016-11-12 MED ORDER — ALPRAZOLAM 0.25 MG PO TABS
0.2500 mg | ORAL_TABLET | Freq: Three times a day (TID) | ORAL | Status: DC | PRN
  Administered 2016-11-12 – 2016-11-16 (×4): 0.25 mg via ORAL
  Filled 2016-11-12 (×4): qty 1

## 2016-11-12 MED ORDER — ATORVASTATIN CALCIUM 80 MG PO TABS
80.0000 mg | ORAL_TABLET | Freq: Every day | ORAL | Status: DC
  Administered 2016-11-12: 80 mg via ORAL
  Filled 2016-11-12: qty 1

## 2016-11-12 MED ORDER — PREDNISONE 50 MG PO TABS
60.0000 mg | ORAL_TABLET | ORAL | Status: AC
  Administered 2016-11-12: 60 mg via ORAL
  Filled 2016-11-12: qty 1

## 2016-11-12 MED ORDER — PREDNISONE 50 MG PO TABS
60.0000 mg | ORAL_TABLET | ORAL | Status: AC
  Administered 2016-11-13: 60 mg via ORAL
  Filled 2016-11-12: qty 1

## 2016-11-12 NOTE — Progress Notes (Addendum)
Patient Name: Troy [name] Date of Encounter: 11/12/2016  Primary Cardiologist: Dr. Sundra Aland Problem List     Active Problems:   CAD S/P percutaneous coronary angioplasty   Dyslipidemia   Essential (primary) hypertension   Chest pain with moderate risk of acute coronary syndrome   Atrial fibrillation with rapid ventricular response ([facility])   Chest pain     Subjective   No complaints.  Nuclear stress test high risk with apical ischemia.   Inpatient Medications    Scheduled Meds: . aspirin EC  81 mg Oral Daily  . losartan  100 mg Oral Daily  . metoprolol  100 mg Oral BID  . multivitamin with minerals  1 tablet Oral Daily  . niacin  100 mg Oral BID WC  . omega-3 acid ethyl esters  2 g Oral Daily  . sodium chloride flush  3 mL Intravenous Q12H   Continuous Infusions: . sodium chloride    . sodium chloride    . diltiazem (CARDIZEM) infusion 5 mg/hr (11/11/16 1621)  . heparin 1,700 Units/hr (11/12/16 0952)   PRN Meds: sodium chloride, acetaminophen, ALPRAZolam, nitroGLYCERIN, ondansetron (ZOFRAN) IV, sodium chloride flush, zolpidem   Vital Signs    Vitals:   11/11/16 2144 11/11/16 2324 11/12/16 0748 11/12/16 0902  BP: (!) 129/101  (!) 140/104 (!) 124/98  Pulse: 79  80 82  Resp:   (!) 25   Temp:  97.8 F (36.6 C) 97.6 F (36.4 C)   TempSrc:  Oral Oral   SpO2:   98%   Weight:      Height:        Intake/Output Summary (Last 24 hours) at 11/12/16 1113 Last data filed at 11/12/16 0800  Gross per 24 hour  Intake              492 ml  Output              400 ml  Net               92 ml   Filed Weights   11/10/16 1416  Weight: 244 lb (110.7 kg)    Physical Exam   GEN: Well nourished, well developed, in no acute distress.  HEENT: Grossly normal.  Neck: Supple, no JVD, carotid bruits, or masses. Cardiac: Ir Ir, no murmurs, rubs, or gallops. No clubbing, cyanosis, edema.  Radials/DP/PT 2+ and equal bilaterally.  Respiratory:  Respirations regular and  unlabored, clear to auscultation bilaterally. GI: Soft, nontender, nondistended, BS + x 4. MS: no deformity or atrophy. Skin: warm and dry, no rash. Neuro:  Strength and sensation are intact. Psych: AAOx3.  Normal affect.  Labs    CBC  Recent Labs  11/10/16 1416 11/12/16 0802  WBC 7.0 5.7  HGB 14.8 14.2  HCT 43.4 42.2  MCV 86.8 87.4  PLT 156 A999333   Basic Metabolic Panel  Recent Labs  11/10/16 1416 11/11/16 0704  NA 140 140  K 4.6 4.0  CL 107 107  CO2 24 26  GLUCOSE 98 104*  BUN 26* 17  CREATININE 1.20 0.98  CALCIUM 9.4 8.9   Liver Function Tests  Recent Labs  11/11/16 0704  AST 19  ALT 27  ALKPHOS 35*  BILITOT 0.6  PROT 6.4*  ALBUMIN 3.5   No results for input(s): LIPASE, AMYLASE in the last 72 hours. Cardiac Enzymes  Recent Labs  11/10/16 1947 11/11/16 0035 11/11/16 0704  TROPONINI 0.03* 0.03* <0.03   BNP Invalid input(s): POCBNP D-Dimer  No results for input(s): DDIMER in the last 72 hours. Hemoglobin A1C No results for input(s): HGBA1C in the last 72 hours. Fasting Lipid Panel No results for input(s): CHOL, HDL, LDLCALC, TRIG, CHOLHDL, LDLDIRECT in the last 72 hours. Thyroid Function Tests  Recent Labs  11/10/16 1947  TSH 1.090    Telemetry    Unable to review as patient seen in nuc med   ECG    N/A  Radiology    Dg Chest 2 View  Result Date: 11/10/2016 CLINICAL DATA:  Atrial fibrillation x1 month with left upper chest pain and dyspnea times 3-4 days. EXAM: CHEST  2 VIEW COMPARISON:  07/25/2016 CT FINDINGS: The heart size and mediastinal contours are within normal limits. Mild atherosclerosis of the aortic arch. No aneurysm. Both lungs are clear. 12 mm central nodular density in the right lower lobe seen by CT is not apparent radiographically. The visualized skeletal structures are unremarkable. IMPRESSION: No active cardiopulmonary disease.  Aortic atherosclerosis. Electronically Signed   By: Ashley Royalty M.D.   On: 11/10/2016  15:57   Nm Myocar Multi W/spect W/wall Motion / Ef  Result Date: 11/11/2016 CLINICAL DATA:  Six 66 year old male with chest pain a history of coronary catheterization with angioplasty. EXAM: MYOCARDIAL IMAGING WITH SPECT (REST AND PHARMACOLOGIC-STRESS) GATED LEFT VENTRICULAR WALL MOTION STUDY LEFT VENTRICULAR EJECTION FRACTION TECHNIQUE: Standard myocardial SPECT imaging was performed after resting intravenous injection of 10 mCi Tc-20m tetrofosmin. Subsequently, intravenous infusion of Lexiscan was performed under the supervision of the Cardiology staff. At peak effect of the drug, 30 mCi Tc-10m tetrofosmin was injected intravenously and standard myocardial SPECT imaging was performed. Quantitative gated imaging was also performed to evaluate left ventricular wall motion, and estimate left ventricular ejection fraction. COMPARISON:  None. FINDINGS: Perfusion: Small focus of decreased profusion at the apex on stress imaging which normalizes with rest imaging. Wall Motion: Global hypokinesia. No focal wall motion abnormality. Mild LEFT ventricular film dilatation. Left Ventricular Ejection Fraction: 33 % End diastolic volume 0000000 ml End systolic volume 93 ml IMPRESSION: 1. Small focus ischemia at the apex. 2. Global hypokinesia 3. Left ventricular ejection fraction 33% 4. Non invasive risk stratification*: High (risk categorization based on low ejection fraction). *2012 Appropriate Use Criteria for Coronary Revascularization Focused Update: J Am Coll Cardiol. N6492421. http://content.airportbarriers.com.aspx?articleid=[email or web address] Electronically Signed   By: Suzy Bouchard M.D.   On: 11/11/2016 12:44    Cardiac Studies   Pending echo  Patient Profile     Troy [name] is a 66 y.o. male with a history of 06/2015 DES mLAD & RPDA w/ VF arrest, PCI in [place] 2010, HTN, HLD, MR, PAF,  pulm nodule stable 07/2016 who seen in clinic for atrial flutter 11/06/16 noted in atrial flutter and started in cardizem  CD 120 however not started due to cost  who came to ER 11/11/15 for chest pain and being admitted.   Assessment & Plan    1.  Persistent atrial fibrillation with RVR - will continue on IV Cardizem gtt.  Eliquis on hold for cath.  Continue IV Heparin gtt.  Continue lopressor 100mg  PO BID. TSH ok. TEE/DCCV has been cancelled due to abnormal nuclear stress test.  2.  Chest pain that sounds somewhat atypical and he has not had CP in the past with his CAD.  His anginal equivalent is SOB which has not really increased much recently.  Trop neg.  EKG nonischemic. Nuclear stress test high risk with apical ischemia and reduced LVF with EF 33%.  Plan for left heart cath tomorrow.  Continue IV Heparin gtt/statin and BB.  Cardiac catheterization was discussed with the patient fully. The patient understands that risks include but are not limited to stroke (1 in 1000), death (1 in 59), kidney failure [usually temporary] (1 in 500), bleeding (1 in 200), allergic reaction [possibly serious] (1 in 200).  The patient understands and is willing to proceed.  He did have a Vfib arrest during his last diagnostic cath with ? Of dye allergy vs. Due to brisk coronary injection.  Will go ahead and treat for possible dye allergy with Prednisone, Pepcid and Benadryl.  3.  DCM ? Ischemic vs. Tachycardia induced.  He has known CAD and has had increased SOB which is his anginal equivalent and new CP that is worse with exertion.  For cath in am.  Continue BB and ARB.  Add aldactone 25mg  daily after cath.  3.  ASCAD with history of cath 06/2015 with DES mLAD & RPDA w/ VF arrest during diagnostic portion, PCI in [place] 2010.  His typical anginal equivalent is SOB not CP.  Continue ASA/BB and statin.   4.  HTN - BP borderline controlled.  Continue lopressor 100mg  BID and losartan 100mg  daily.  5.  Hyperlipidemia - LDL goal < 70.   Check FLP.  He is currently not on a statin. He was on high dose lipitor as last OV with me but wanted to  try an herbal approach.  Will start on Lipitor 80mg  daily and will need repeat FLP and ALT in 6 weeks.  6.  Mild to moderate MR by echo 06/2016  7.  PTSD - Xanax for anxiety  Signed, Fransico Him, MD  11/12/2016, 11:13 AM

## 2016-11-12 NOTE — Progress Notes (Signed)
ANTICOAGULATION CONSULT NOTE - Follow Up Consult  Pharmacy Consult for heparin Indication: chest pain/ACS and atrial fibrillation  Allergies  Allergen Reactions  . Gluten Meal Other (See Comments)    REACTION: Celiac disease  . Tizanidine Anxiety    Depression  . Whey Other (See Comments)    REACTION: Celiac disease  . Morphine And Related Other (See Comments)    REACTION: "REALLY BAD STOMACH PAIN"    Patient Measurements: Height: 5\' 11"  (180.3 cm) Weight: 244 lb (110.7 kg) IBW/kg (Calculated) : 75.3 Heparin Dosing Weight: 99.1 kg  Vital Signs: Temp: 98.1 F (36.7 C) (01/03 1645) Temp Source: Oral (01/03 1645) BP: 138/90 (01/03 1645) Pulse Rate: 110 (01/03 1645)  Labs:  Recent Labs  11/10/16 1416 11/10/16 1947 11/11/16 0035 11/11/16 0704 11/12/16 0802 11/12/16 1537  HGB 14.8  --   --   --  14.2  --   HCT 43.4  --   --   --  42.2  --   PLT 156  --   --   --  156  --   APTT  --   --   --   --  109* 112*  HEPARINUNFRC  --   --   --   --  1.38*  --   CREATININE 1.20  --   --  0.98  --   --   TROPONINI  --  0.03* 0.03* <0.03  --   --     Estimated Creatinine Clearance: 92.6 mL/min (by C-G formula based on SCr of 0.98 mg/dL). SCr stable   Assessment: Pt is a 41 YOM on PTA apixaban for AF. Last dose of apixaban given 1/2 1119. Pharmacy managing IV heparin while apixaban on hold for cath tomorrow.  APTT remains above goal at 112 sec despite previous rate decrease. No bleeding noted. Monitoring aPTT levels until heparin level and aPTT correlate.  Goal of Therapy:  Heparin level 0.3-0.7 units/ml  APTT 66-102s Monitor platelets by anticoagulation protocol: Yes   Plan:  Decrease heparin drip to 1600 units/hr Obtain 6 hour aPTT Obtain daily aPTT/HL/CBC Monitor for s/sx bleeding  Renold Genta, PharmD, BCPS Clinical Pharmacist Phone for tonight - [place] - [phone] 11/12/2016 5:33 PM

## 2016-11-12 NOTE — Progress Notes (Signed)
ANTICOAGULATION CONSULT NOTE - Follow Up Consult  Pharmacy Consult for heparin Indication: chest pain/ACS and atrial fibrillation  Allergies  Allergen Reactions  . Gluten Meal Other (See Comments)    REACTION: Celiac disease  . Tizanidine Anxiety    Depression  . Whey Other (See Comments)    REACTION: Celiac disease  . Morphine And Related Other (See Comments)    REACTION: "REALLY BAD STOMACH PAIN"    Patient Measurements: Height: 5\' 11"  (180.3 cm) Weight: 244 lb (110.7 kg) IBW/kg (Calculated) : 75.3 Heparin Dosing Weight: 99.1 kg  Vital Signs: Temp: 97.6 F (36.4 C) (01/03 0748) Temp Source: Oral (01/03 0748) BP: 124/98 (01/03 0902) Pulse Rate: 82 (01/03 0902)  Labs:  Recent Labs  11/10/16 1416 11/10/16 1947 11/11/16 0035 11/11/16 0704 11/12/16 0802  HGB 14.8  --   --   --  14.2  HCT 43.4  --   --   --  42.2  PLT 156  --   --   --  156  APTT  --   --   --   --  109*  HEPARINUNFRC  --   --   --   --  1.38*  CREATININE 1.20  --   --  0.98  --   TROPONINI  --  0.03* 0.03* <0.03  --     Estimated Creatinine Clearance: 92.6 mL/min (by C-G formula based on SCr of 0.98 mg/dL). SCr stable   Assessment: Pt is a 11 YOM on PTA apixaban for AF. Last dose of apixaban given 1/2 1119. Pt was started on heparin (d/t planned cath) 1800 units/hr on 1/2 based on previous therapeutic doses. Baseline HL and aPTT not obtained. On 1/3 AM aPTT elevated at 109 with HL elevated at 1.38 likely d/t recent apixaban dose.  No bleeding reported by nurse.  Goal of Therapy:  Heparin level 0.3-0.7 units/ml  APTT 66-102s Monitor platelets by anticoagulation protocol: Yes   Plan:  Decrease heparin drip to 1700 units/hr Obtain 6 hour aPTT Obtain daily aPTT/HL/CBC Monitor for s/sx bleeding  Cheral Almas, PharmD Candidate 11/12/2016,9:53 AM

## 2016-11-13 ENCOUNTER — Encounter (HOSPITAL_COMMUNITY)
Admission: EM | Disposition: A | Discharge status: Home / Self Care | Payer: Self-pay | Source: Home / Self Care | Attending: Cardiology

## 2016-11-13 ENCOUNTER — Encounter (HOSPITAL_COMMUNITY): Payer: Self-pay | Admitting: Cardiovascular Disease

## 2016-11-13 HISTORY — PX: CARDIAC CATHETERIZATION: SHX172

## 2016-11-13 LAB — HEPARIN LEVEL (UNFRACTIONATED): HEPARIN UNFRACTIONATED: 0.98 [IU]/mL — AB (ref 0.30–0.70)

## 2016-11-13 LAB — CBC
HEMATOCRIT: 40.5 % (ref 39.0–52.0)
HEMOGLOBIN: 13.5 g/dL (ref 13.0–17.0)
MCH: 28.7 pg (ref 26.0–34.0)
MCHC: 33.3 g/dL (ref 30.0–36.0)
MCV: 86.2 fL (ref 78.0–100.0)
Platelets: 179 10*3/uL (ref 150–400)
RBC: 4.7 MIL/uL (ref 4.22–5.81)
RDW: 13.1 % (ref 11.5–15.5)
WBC: 6.5 10*3/uL (ref 4.0–10.5)

## 2016-11-13 LAB — APTT
APTT: 92 s — AB (ref 24–36)
APTT: 109 s — AB (ref 24–36)
aPTT: 89 seconds — ABNORMAL HIGH (ref 24–36)

## 2016-11-13 LAB — PROTIME-INR
INR: 1.13
PROTHROMBIN TIME: 14.5 s (ref 11.4–15.2)

## 2016-11-13 SURGERY — LEFT HEART CATH AND CORONARY ANGIOGRAPHY

## 2016-11-13 MED ORDER — FAMOTIDINE IN NACL 20-0.9 MG/50ML-% IV SOLN
20.0000 mg | INTRAVENOUS | Status: AC
  Administered 2016-11-13: 20 mg via INTRAVENOUS
  Filled 2016-11-13: qty 50

## 2016-11-13 MED ORDER — MIDAZOLAM HCL 2 MG/2ML IJ SOLN
INTRAMUSCULAR | Status: AC
  Filled 2016-11-13: qty 2

## 2016-11-13 MED ORDER — ASPIRIN 81 MG PO CHEW
81.0000 mg | CHEWABLE_TABLET | ORAL | Status: AC
  Administered 2016-11-13: 81 mg via ORAL

## 2016-11-13 MED ORDER — SODIUM CHLORIDE 0.9 % IV SOLN
INTRAVENOUS | Status: AC
  Administered 2016-11-13: 16:00:00 via INTRAVENOUS

## 2016-11-13 MED ORDER — SODIUM CHLORIDE 0.9% FLUSH
3.0000 mL | Freq: Two times a day (BID) | INTRAVENOUS | Status: DC
  Administered 2016-11-13 – 2016-11-16 (×5): 3 mL via INTRAVENOUS

## 2016-11-13 MED ORDER — FENTANYL CITRATE (PF) 100 MCG/2ML IJ SOLN
INTRAMUSCULAR | Status: AC
  Filled 2016-11-13: qty 2

## 2016-11-13 MED ORDER — METHYLPREDNISOLONE SODIUM SUCC 125 MG IJ SOLR
INTRAMUSCULAR | Status: DC | PRN
  Administered 2016-11-13: 60 mg via INTRAVENOUS

## 2016-11-13 MED ORDER — VERAPAMIL HCL 2.5 MG/ML IV SOLN
INTRAVENOUS | Status: AC
  Filled 2016-11-13: qty 2

## 2016-11-13 MED ORDER — DIPHENHYDRAMINE HCL 50 MG/ML IJ SOLN
25.0000 mg | INTRAMUSCULAR | Status: AC
  Administered 2016-11-13: 25 mg via INTRAVENOUS
  Filled 2016-11-13: qty 1

## 2016-11-13 MED ORDER — NITROGLYCERIN 1 MG/10 ML FOR IR/CATH LAB
INTRA_ARTERIAL | Status: AC
  Filled 2016-11-13: qty 10

## 2016-11-13 MED ORDER — HEPARIN SODIUM (PORCINE) 1000 UNIT/ML IJ SOLN
INTRAMUSCULAR | Status: DC | PRN
  Administered 2016-11-13: 5000 [IU] via INTRAVENOUS

## 2016-11-13 MED ORDER — SODIUM CHLORIDE 0.9 % IV SOLN
250.0000 mL | INTRAVENOUS | Status: DC | PRN
  Administered 2016-11-17: 500 mL via INTRAVENOUS

## 2016-11-13 MED ORDER — ACETAMINOPHEN 325 MG PO TABS: 650.0000 mg | ORAL_TABLET | ORAL | Status: DC | PRN

## 2016-11-13 MED ORDER — ASPIRIN 81 MG PO CHEW: 81.0000 mg | CHEWABLE_TABLET | Freq: Every day | ORAL | Status: DC

## 2016-11-13 MED ORDER — ONDANSETRON HCL 4 MG/2ML IJ SOLN
4.0000 mg | Freq: Four times a day (QID) | INTRAMUSCULAR | Status: DC | PRN
Start: 2016-11-13 — End: 2016-11-17

## 2016-11-13 MED ORDER — LIDOCAINE HCL (PF) 1 % IJ SOLN
INTRAMUSCULAR | Status: AC
  Filled 2016-11-13: qty 30

## 2016-11-13 MED ORDER — LIDOCAINE HCL (PF) 1 % IJ SOLN
INTRAMUSCULAR | Status: DC | PRN
  Administered 2016-11-13: 2 mL

## 2016-11-13 MED ORDER — ASPIRIN 81 MG PO CHEW: 81.0000 mg | CHEWABLE_TABLET | ORAL | Status: DC

## 2016-11-13 MED ORDER — SODIUM CHLORIDE 0.9 % IV SOLN: INTRAVENOUS | Status: DC

## 2016-11-13 MED ORDER — APIXABAN 5 MG PO TABS
5.0000 mg | ORAL_TABLET | Freq: Two times a day (BID) | ORAL | Status: DC
  Administered 2016-11-13 – 2016-11-17 (×8): 5 mg via ORAL
  Filled 2016-11-13 (×8): qty 1

## 2016-11-13 MED ORDER — FENTANYL CITRATE (PF) 100 MCG/2ML IJ SOLN
INTRAMUSCULAR | Status: DC | PRN
  Administered 2016-11-13: 25 ug via INTRAVENOUS

## 2016-11-13 MED ORDER — HEPARIN SODIUM (PORCINE) 1000 UNIT/ML IJ SOLN
INTRAMUSCULAR | Status: AC
  Filled 2016-11-13: qty 1

## 2016-11-13 MED ORDER — VERAPAMIL HCL 2.5 MG/ML IV SOLN
INTRA_ARTERIAL | Status: DC | PRN
  Administered 2016-11-13: 5 mL via INTRA_ARTERIAL

## 2016-11-13 MED ORDER — SODIUM CHLORIDE 0.9% FLUSH: 3.0000 mL | INTRAVENOUS | Status: DC | PRN

## 2016-11-13 MED ORDER — ATORVASTATIN CALCIUM 80 MG PO TABS
80.0000 mg | ORAL_TABLET | Freq: Every day | ORAL | Status: DC
Start: 2016-11-13 — End: 2016-11-17
  Administered 2016-11-13 – 2016-11-17 (×5): 80 mg via ORAL
  Filled 2016-11-13 (×6): qty 1

## 2016-11-13 MED ORDER — HEPARIN (PORCINE) IN NACL 2-0.9 UNIT/ML-% IJ SOLN
INTRAMUSCULAR | Status: AC
  Filled 2016-11-13: qty 1000

## 2016-11-13 MED ORDER — HEPARIN (PORCINE) IN NACL 2-0.9 UNIT/ML-% IJ SOLN
INTRAMUSCULAR | Status: DC | PRN
  Administered 2016-11-13: 1000 mL

## 2016-11-13 MED ORDER — IOPAMIDOL (ISOVUE-370) INJECTION 76%
INTRAVENOUS | Status: AC
  Filled 2016-11-13: qty 100

## 2016-11-13 MED ORDER — METOPROLOL TARTRATE 50 MG PO TABS
125.0000 mg | ORAL_TABLET | Freq: Two times a day (BID) | ORAL | Status: DC
  Administered 2016-11-13 – 2016-11-14 (×2): 125 mg via ORAL
  Filled 2016-11-13 (×2): qty 1

## 2016-11-13 MED ORDER — METHYLPREDNISOLONE SODIUM SUCC 125 MG IJ SOLR
INTRAMUSCULAR | Status: AC
  Filled 2016-11-13: qty 2

## 2016-11-13 MED ORDER — SODIUM CHLORIDE 0.9 % IV SOLN
INTRAVENOUS | Status: DC
  Administered 2016-11-13: 06:00:00 via INTRAVENOUS

## 2016-11-13 MED ORDER — MIDAZOLAM HCL 2 MG/2ML IJ SOLN
INTRAMUSCULAR | Status: DC | PRN
  Administered 2016-11-13: 1 mg via INTRAVENOUS

## 2016-11-13 SURGICAL SUPPLY — 13 items
CATH INFINITI 5FR ANG PIGTAIL (CATHETERS) ×2 IMPLANT
CATH OPTITORQUE TIG 4.0 5F (CATHETERS) ×2 IMPLANT
DEVICE RAD COMP TR BAND LRG (VASCULAR PRODUCTS) ×2 IMPLANT
ELECT DEFIB PAD ADLT CADENCE (PAD) ×2 IMPLANT
GLIDESHEATH SLEND A-KIT 6F 22G (SHEATH) ×2 IMPLANT
GUIDEWIRE INQWIRE 1.5J.035X260 (WIRE) ×1 IMPLANT
INQWIRE 1.5J .035X260CM (WIRE) ×2
KIT HEART LEFT (KITS) ×2 IMPLANT
PACK CARDIAC CATHETERIZATION (CUSTOM PROCEDURE TRAY) ×2 IMPLANT
SYR MEDRAD MARK V 150ML (SYRINGE) IMPLANT
TRANSDUCER W/STOPCOCK (MISCELLANEOUS) ×2 IMPLANT
TUBING CIL FLEX 10 FLL-RA (TUBING) ×2 IMPLANT
WIRE HI TORQ VERSACORE-J 145CM (WIRE) ×2 IMPLANT

## 2016-11-13 NOTE — Interval H&P Note (Signed)
Cath Lab Visit (complete for each Cath Lab visit)  Clinical Evaluation Leading to the Procedure:   ACS: Yes.    Non-ACS:    Anginal Classification: CCS IV  Anti-ischemic medical therapy: Minimal Therapy (1 class of medications)  Non-Invasive Test Results: Intermediate-risk stress test findings: cardiac mortality 1-3%/year  Prior CABG: No previous CABG      History and Physical Interval Note:  11/13/2016 12:41 PM  Troy [name]  has presented today for surgery, with the diagnosis of abnormal stress test  The various methods of treatment have been discussed with the patient and family. After consideration of risks, benefits and other options for treatment, the patient has consented to  Procedure(s): Left Heart Cath and Coronary Angiography (N/A) as a surgical intervention .  The patient's history has been reviewed, patient examined, no change in status, stable for surgery.  I have reviewed the patient's chart and labs.  Questions were answered to the patient's satisfaction.     Quay Burow

## 2016-11-13 NOTE — H&P (View-Only) (Signed)
Patient Name: Troy [name] Date of Encounter: 11/13/2016  Primary Cardiologist: Dr. Sundra Aland Problem List     Active Problems:   CAD S/P percutaneous coronary angioplasty   Dyslipidemia   Essential (primary) hypertension   Chest pain with moderate risk of acute coronary syndrome   Atrial fibrillation with rapid ventricular response (HCC)   Chest pain   Unstable angina pectoris (HCC)     Subjective   No complaints.  Nuclear stress test high risk with apical ischemia.   Inpatient Medications    Scheduled Meds: . aspirin EC  81 mg Oral Daily  . atorvastatin  80 mg Oral q1800  . losartan  100 mg Oral Daily  . metoprolol  100 mg Oral BID  . multivitamin with minerals  1 tablet Oral Daily  . niacin  100 mg Oral BID WC  . omega-3 acid ethyl esters  2 g Oral Daily  . sodium chloride flush  3 mL Intravenous Q12H   Continuous Infusions: . sodium chloride    . sodium chloride    . sodium chloride 10 mL/hr at 11/13/16 0556  . diltiazem (CARDIZEM) infusion 5 mg/hr (11/13/16 0701)  . heparin 1,400 Units/hr (11/13/16 0701)   PRN Meds: sodium chloride, acetaminophen, ALPRAZolam, nitroGLYCERIN, ondansetron (ZOFRAN) IV, sodium chloride flush, zolpidem   Vital Signs    Vitals:   11/13/16 0400 11/13/16 0430 11/13/16 0500 11/13/16 0739  BP: 116/84  (!) 123/91 130/90  Pulse: 77 64 80 (!) 54  Resp: (!) 7 17 (!) 4 11  Temp:    98.3 F (36.8 C)  TempSrc:    Oral  SpO2: 96% 95% 95% 96%  Weight:      Height:        Intake/Output Summary (Last 24 hours) at 11/13/16 1043 Last data filed at 11/13/16 0831  Gross per 24 hour  Intake           963.56 ml  Output              926 ml  Net            37.56 ml   Filed Weights   11/10/16 1416  Weight: 244 lb (110.7 kg)    Physical Exam   GEN: Well nourished, well developed, in no acute distress.  HEENT: Grossly normal.  Neck: Supple, no JVD, carotid bruits, or masses. Cardiac: Ir Ir, no murmurs, rubs, or gallops. No  clubbing, cyanosis, edema.  Radials/DP/PT 2+ and equal bilaterally.  Respiratory:  Respirations regular and unlabored, clear to auscultation bilaterally. GI: Soft, nontender, nondistended, BS + x 4. MS: no deformity or atrophy. Skin: warm and dry, no rash. Neuro:  Strength and sensation are intact. Psych: AAOx3.  Normal affect.  Labs    CBC  Recent Labs  11/12/16 0802 11/13/16 0319  WBC 5.7 6.5  HGB 14.2 13.5  HCT 42.2 40.5  MCV 87.4 86.2  PLT 156 0000000   Basic Metabolic Panel  Recent Labs  11/10/16 1416 11/11/16 0704  NA 140 140  K 4.6 4.0  CL 107 107  CO2 24 26  GLUCOSE 98 104*  BUN 26* 17  CREATININE 1.20 0.98  CALCIUM 9.4 8.9   Liver Function Tests  Recent Labs  11/11/16 0704  AST 19  ALT 27  ALKPHOS 35*  BILITOT 0.6  PROT 6.4*  ALBUMIN 3.5   No results for input(s): LIPASE, AMYLASE in the last 72 hours. Cardiac Enzymes  Recent Labs  11/10/16 1947 11/11/16 0035  11/11/16 0704  TROPONINI 0.03* 0.03* <0.03   BNP Invalid input(s): POCBNP D-Dimer No results for input(s): DDIMER in the last 72 hours. Hemoglobin A1C No results for input(s): HGBA1C in the last 72 hours. Fasting Lipid Panel No results for input(s): CHOL, HDL, LDLCALC, TRIG, CHOLHDL, LDLDIRECT in the last 72 hours. Thyroid Function Tests  Recent Labs  11/10/16 1947  TSH 1.090    Telemetry    Unable to review as patient seen in nuc med   ECG    N/A  Radiology    No results found.  Cardiac Studies   Pending echo  Patient Profile     Rafer Kapral is a 66 y.o. male with a history of 06/2015 DES mLAD & RPDA w/ VF arrest, PCI in [place] 2010, HTN, HLD, MR, PAF,  pulm nodule stable 07/2016 who seen in clinic for atrial flutter 11/06/16 noted in atrial flutter and started in cardizem CD 120 however not started due to cost  who came to ER 11/11/15 for chest pain and being admitted.   Assessment & Plan    1.  Persistent atrial fibrillation with RVR - HR controlled.  Given LV  dysfunction will stop IV Cardizem gtt.  Increase Lopressor to 125mg  BID.  Eliquis on hold for cath.  Continue IV Heparin gtt.TSH ok. TEE/DCCV has been cancelled due to abnormal nuclear stress test.  2.  Chest pain that sounds somewhat atypical and he has not had CP in the past with his CAD.  His anginal equivalent is SOB which has not really increased much recently.  Trop neg.  EKG nonischemic. Nuclear stress test high risk with apical ischemia and reduced LVF with EF 33%.  Plan for left heart cath tomorrow.  Continue IV Heparin gtt/statin and BB.  Cardiac catheterization was discussed with the patient fully. The patient understands that risks include but are not limited to stroke (1 in 1000), death (1 in 41), kidney failure [usually temporary] (1 in 500), bleeding (1 in 200), allergic reaction [possibly serious] (1 in 200).  The patient understands and is willing to proceed.  He did have a Vfib arrest during his last diagnostic cath with ? Of dye allergy vs. Due to brisk coronary injection.  Will go ahead and treat for possible dye allergy with Prednisone, Pepcid and Benadryl.  3.  DCM ? Ischemic vs. Tachycardia induced.  EF 33%. He has known CAD and has had increased SOB which is his anginal equivalent and new CP that is worse with exertion.  For cath in am.  Continue BB and ARB.  Add aldactone 25mg  daily after cath.  3.  ASCAD with history of cath 06/2015 with DES mLAD & RPDA w/ VF arrest during diagnostic portion, PCI in [place] 2010.  His typical anginal equivalent is SOB not CP.  Continue ASA/BB and statin.   4.  HTN - BP borderline controlled.  Increase lopressor to125mg  BID and losartan 100mg  daily.  Add aldactone after cath.  5.  Hyperlipidemia - LDL goal < 70.   He was not on a statin at time of admission. He was on high dose lipitor as last OV with me but wanted to try an herbal approach. Restarted on Lipitor 80mg  daily and will need repeat FLP and ALT in 6 weeks.  6.  Mild to moderate MR by  echo 06/2016  7.  PTSD - Xanax for anxiety  Signed, Fransico Him, MD  11/13/2016, 10:43 AM

## 2016-11-13 NOTE — Progress Notes (Signed)
ANTICOAGULATION CONSULT NOTE - Follow Up Consult  Pharmacy Consult for heparin Indication: chest pain/ACS and atrial fibrillation  Labs:  Recent Labs  11/10/16 1416 11/10/16 1947 11/11/16 0035 11/11/16 0704  11/12/16 0802 11/12/16 1537 11/12/16 2327 11/13/16 0319  HGB 14.8  --   --   --   --  14.2  --   --  13.5  HCT 43.4  --   --   --   --  42.2  --   --  40.5  PLT 156  --   --   --   --  156  --   --  179  APTT  --   --   --   --   < > 109* 112* 92* 109*  HEPARINUNFRC  --   --   --   --   --  1.38*  --   --  0.98*  CREATININE 1.20  --   --  0.98  --   --   --   --   --   TROPONINI  --  0.03* 0.03* <0.03  --   --   --   --   --   < > = values in this interval not displayed.   Assessment: 66yo male now above goal on heparin after one PTT at goal.  Goal of Therapy:  aPTT 66-102 seconds   Plan:  Will decrease heparin gtt by 2 units/kg/hr to 1400 units/hr and check level in 6hr.  Wynona Neat, PharmD, BCPS  11/13/2016,4:44 AM

## 2016-11-13 NOTE — Progress Notes (Signed)
ANTICOAGULATION CONSULT NOTE - Follow Up Consult  Pharmacy Consult for heparin Indication: chest pain/ACS and atrial fibrillation  Labs:  Recent Labs  11/10/16 1416 11/10/16 1947 11/11/16 0035 11/11/16 0704 11/12/16 0802 11/12/16 1537 11/12/16 2327  HGB 14.8  --   --   --  14.2  --   --   HCT 43.4  --   --   --  42.2  --   --   PLT 156  --   --   --  156  --   --   APTT  --   --   --   --  109* 112* 92*  HEPARINUNFRC  --   --   --   --  1.38*  --   --   CREATININE 1.20  --   --  0.98  --   --   --   TROPONINI  --  0.03* 0.03* <0.03  --   --   --     Assessment/Plan:  66yo male therapeutic on heparin after rate change. Will continue gtt at current rate and confirm stable with am labs.   Wynona Neat, PharmD, BCPS  11/13/2016,12:14 AM

## 2016-11-13 NOTE — Progress Notes (Addendum)
Patient Name: Troy [name] Date of Encounter: 11/13/2016  Primary Cardiologist: Dr. Sundra Aland Problem List     Active Problems:   CAD S/P percutaneous coronary angioplasty   Dyslipidemia   Essential (primary) hypertension   Chest pain with moderate risk of acute coronary syndrome   Atrial fibrillation with rapid ventricular response (HCC)   Chest pain   Unstable angina pectoris (HCC)     Subjective   No complaints.  Nuclear stress test high risk with apical ischemia.   Inpatient Medications    Scheduled Meds: . aspirin EC  81 mg Oral Daily  . atorvastatin  80 mg Oral q1800  . losartan  100 mg Oral Daily  . metoprolol  100 mg Oral BID  . multivitamin with minerals  1 tablet Oral Daily  . niacin  100 mg Oral BID WC  . omega-3 acid ethyl esters  2 g Oral Daily  . sodium chloride flush  3 mL Intravenous Q12H   Continuous Infusions: . sodium chloride    . sodium chloride    . sodium chloride 10 mL/hr at 11/13/16 0556  . diltiazem (CARDIZEM) infusion 5 mg/hr (11/13/16 0701)  . heparin 1,400 Units/hr (11/13/16 0701)   PRN Meds: sodium chloride, acetaminophen, ALPRAZolam, nitroGLYCERIN, ondansetron (ZOFRAN) IV, sodium chloride flush, zolpidem   Vital Signs    Vitals:   11/13/16 0400 11/13/16 0430 11/13/16 0500 11/13/16 0739  BP: 116/84  (!) 123/91 130/90  Pulse: 77 64 80 (!) 54  Resp: (!) 7 17 (!) 4 11  Temp:    98.3 F (36.8 C)  TempSrc:    Oral  SpO2: 96% 95% 95% 96%  Weight:      Height:        Intake/Output Summary (Last 24 hours) at 11/13/16 1043 Last data filed at 11/13/16 0831  Gross per 24 hour  Intake           963.56 ml  Output              926 ml  Net            37.56 ml   Filed Weights   11/10/16 1416  Weight: 244 lb (110.7 kg)    Physical Exam   GEN: Well nourished, well developed, in no acute distress.  HEENT: Grossly normal.  Neck: Supple, no JVD, carotid bruits, or masses. Cardiac: Ir Ir, no murmurs, rubs, or gallops. No  clubbing, cyanosis, edema.  Radials/DP/PT 2+ and equal bilaterally.  Respiratory:  Respirations regular and unlabored, clear to auscultation bilaterally. GI: Soft, nontender, nondistended, BS + x 4. MS: no deformity or atrophy. Skin: warm and dry, no rash. Neuro:  Strength and sensation are intact. Psych: AAOx3.  Normal affect.  Labs    CBC  Recent Labs  11/12/16 0802 11/13/16 0319  WBC 5.7 6.5  HGB 14.2 13.5  HCT 42.2 40.5  MCV 87.4 86.2  PLT 156 0000000   Basic Metabolic Panel  Recent Labs  11/10/16 1416 11/11/16 0704  NA 140 140  K 4.6 4.0  CL 107 107  CO2 24 26  GLUCOSE 98 104*  BUN 26* 17  CREATININE 1.20 0.98  CALCIUM 9.4 8.9   Liver Function Tests  Recent Labs  11/11/16 0704  AST 19  ALT 27  ALKPHOS 35*  BILITOT 0.6  PROT 6.4*  ALBUMIN 3.5   No results for input(s): LIPASE, AMYLASE in the last 72 hours. Cardiac Enzymes  Recent Labs  11/10/16 1947 11/11/16 0035  11/11/16 0704  TROPONINI 0.03* 0.03* <0.03   BNP Invalid input(s): POCBNP D-Dimer No results for input(s): DDIMER in the last 72 hours. Hemoglobin A1C No results for input(s): HGBA1C in the last 72 hours. Fasting Lipid Panel No results for input(s): CHOL, HDL, LDLCALC, TRIG, CHOLHDL, LDLDIRECT in the last 72 hours. Thyroid Function Tests  Recent Labs  11/10/16 1947  TSH 1.090    Telemetry    Unable to review as patient seen in nuc med   ECG    N/A  Radiology    No results found.  Cardiac Studies   Pending echo  Patient Profile     Troy [name] is a 66 y.o. male with a history of 06/2015 DES mLAD & RPDA w/ VF arrest, PCI in [place] 2010, HTN, HLD, MR, PAF,  pulm nodule stable 07/2016 who seen in clinic for atrial flutter 11/06/16 noted in atrial flutter and started in cardizem CD 120 however not started due to cost  who came to ER 11/11/15 for chest pain and being admitted.   Assessment & Plan    1.  Persistent atrial fibrillation with RVR - HR controlled.  Given LV  dysfunction will stop IV Cardizem gtt.  Increase Lopressor to 125mg  BID.  Eliquis on hold for cath.  Continue IV Heparin gtt. CHASD2VASC score is 3.  TSH ok. TEE/DCCV has been cancelled due to abnormal nuclear stress test.  2.  Chest pain that sounds somewhat atypical and he has not had CP in the past with his CAD.  His anginal equivalent is SOB which has not really increased much recently.  Trop neg.  EKG nonischemic. Nuclear stress test high risk with apical ischemia and reduced LVF with EF 33%.  Plan for left heart cath tomorrow.  Continue IV Heparin gtt/statin and BB.  Cardiac catheterization was discussed with the patient fully. The patient understands that risks include but are not limited to stroke (1 in 1000), death (1 in 66), kidney failure [usually temporary] (1 in 500), bleeding (1 in 200), allergic reaction [possibly serious] (1 in 200).  The patient understands and is willing to proceed.  He did have a Vfib arrest during his last diagnostic cath with ? Of dye allergy vs. Due to brisk coronary injection.  Will go ahead and treat for possible dye allergy with Prednisone, Pepcid and Benadryl.  3.  DCM ? Ischemic vs. Tachycardia induced.  EF 33%. He has known CAD and has had increased SOB which is his anginal equivalent and new CP that is worse with exertion.  For cath in am.  Continue BB and ARB.  Add aldactone 25mg  daily after cath.  3.  ASCAD with history of cath 06/2015 with DES mLAD & RPDA w/ VF arrest during diagnostic portion, PCI in [place] 2010.  His typical anginal equivalent is SOB not CP.  Continue ASA/BB and statin.   4.  HTN - BP borderline controlled.  Increase lopressor to125mg  BID and losartan 100mg  daily.  Add aldactone after cath.  5.  Hyperlipidemia - LDL goal < 70.   He was not on a statin at time of admission. He was on high dose lipitor as last OV with me but wanted to try an herbal approach. Restarted on Lipitor 80mg  daily and will need repeat FLP and ALT in 6  weeks.  6.  Mild to moderate MR by echo 06/2016  7.  PTSD - Xanax for anxiety  Signed, Fransico Him, MD  11/13/2016, 10:43 AM

## 2016-11-14 LAB — BASIC METABOLIC PANEL
ANION GAP: 8 (ref 5–15)
BUN: 22 mg/dL — ABNORMAL HIGH (ref 6–20)
CHLORIDE: 108 mmol/L (ref 101–111)
CO2: 24 mmol/L (ref 22–32)
Calcium: 9.4 mg/dL (ref 8.9–10.3)
Creatinine, Ser: 1.13 mg/dL (ref 0.61–1.24)
GFR calc non Af Amer: >60 mL/min (ref >60)
GLUCOSE: 134 mg/dL — AB (ref 65–99)
Potassium: 4.7 mmol/L (ref 3.5–5.1)
Sodium: 140 mmol/L (ref 135–145)

## 2016-11-14 LAB — CBC
HEMATOCRIT: 40.6 % (ref 39.0–52.0)
HEMOGLOBIN: 13.8 g/dL (ref 13.0–17.0)
MCH: 29.3 pg (ref 26.0–34.0)
MCHC: 34 g/dL (ref 30.0–36.0)
MCV: 86.2 fL (ref 78.0–100.0)
Platelets: 182 10*3/uL (ref 150–400)
RBC: 4.71 MIL/uL (ref 4.22–5.81)
RDW: 12.9 % (ref 11.5–15.5)
WBC: 10 10*3/uL (ref 4.0–10.5)

## 2016-11-14 MED ORDER — METOPROLOL TARTRATE 50 MG PO TABS
150.0000 mg | ORAL_TABLET | Freq: Two times a day (BID) | ORAL | Status: DC
  Administered 2016-11-14 – 2016-11-17 (×6): 150 mg via ORAL
  Filled 2016-11-14 (×6): qty 1

## 2016-11-14 MED ORDER — METOPROLOL TARTRATE 25 MG PO TABS
25.0000 mg | ORAL_TABLET | Freq: Once | ORAL | Status: AC
  Administered 2016-11-14: 25 mg via ORAL
  Filled 2016-11-14: qty 1

## 2016-11-14 MED ORDER — OFF THE BEAT BOOK
Freq: Once | Status: AC
  Administered 2016-11-14: 21:00:00
  Filled 2016-11-14: qty 1

## 2016-11-14 MED ORDER — SODIUM CHLORIDE 0.9% FLUSH: 3.0000 mL | INTRAVENOUS | Status: DC | PRN

## 2016-11-14 MED ORDER — SODIUM CHLORIDE 0.9% FLUSH
3.0000 mL | Freq: Two times a day (BID) | INTRAVENOUS | Status: DC
  Administered 2016-11-14 – 2016-11-16 (×4): 3 mL via INTRAVENOUS

## 2016-11-14 NOTE — Telephone Encounter (Signed)
Spoke with patient - he's in [facility], had cath yesterday.  Thinks he has about 18 days worth of Eliquis at home.    Will call him back in 10 days to see how he's doing, determine if okay to make change from Eliquis to warfarin.  Patient voiced understanding.

## 2016-11-14 NOTE — Progress Notes (Addendum)
Patient Name: Troy [name] Date of Encounter: 11/14/2016  Primary Cardiologist: Dr. Sundra Aland Problem List     Active Problems:   CAD S/P percutaneous coronary angioplasty   Dyslipidemia   Essential (primary) hypertension   Chest pain with moderate risk of acute coronary syndrome   Atrial fibrillation with rapid ventricular response (HCC)   Chest pain   Unstable angina pectoris ([place])     Subjective   No complaints.  Nuclear stress test high risk with apical ischemia but cath showed  Patent stents.    Inpatient Medications    Scheduled Meds: . apixaban  5 mg Oral BID  . aspirin EC  81 mg Oral Daily  . atorvastatin  80 mg Oral q1800  . losartan  100 mg Oral Daily  . metoprolol  125 mg Oral BID  . multivitamin with minerals  1 tablet Oral Daily  . niacin  100 mg Oral BID WC  . sodium chloride flush  3 mL Intravenous Q12H  . sodium chloride flush  3 mL Intravenous Q12H   Continuous Infusions:  PRN Meds: sodium chloride, acetaminophen, acetaminophen, ALPRAZolam, ondansetron (ZOFRAN) IV, ondansetron (ZOFRAN) IV, sodium chloride flush, sodium chloride flush, zolpidem   Vital Signs    Vitals:   11/13/16 2300 11/14/16 0300 11/14/16 0700 11/14/16 0743  BP: (!) 137/99 (!) 127/98 (!) 143/98 (!) 143/98  Pulse: (!) 114 77  85  Resp: 17 15 18 17   Temp: 97.5 F (36.4 C) 97.5 F (36.4 C)    TempSrc: Oral Oral  Oral  SpO2: 97% 98%  96%  Weight:      Height:        Intake/Output Summary (Last 24 hours) at 11/14/16 1038 Last data filed at 11/14/16 0500  Gross per 24 hour  Intake            378.5 ml  Output              750 ml  Net           -371.5 ml   Filed Weights   11/10/16 1416  Weight: 244 lb (110.7 kg)    Physical Exam   GEN: Well nourished, well developed, in no acute distress.  HEENT: Grossly normal.  Neck: Supple, no JVD, carotid bruits, or masses. Cardiac: Ir Ir, no murmurs, rubs, or gallops. No clubbing, cyanosis, edema.  Radials/DP/PT 2+ and  equal bilaterally.  Respiratory:  Respirations regular and unlabored, clear to auscultation bilaterally. GI: Soft, nontender, nondistended, BS + x 4. MS: no deformity or atrophy. Skin: warm and dry, no rash. Neuro:  Strength and sensation are intact. Psych: AAOx3.  Normal affect.  Labs    CBC  Recent Labs  11/13/16 0319 11/14/16 0427  WBC 6.5 10.0  HGB 13.5 13.8  HCT 40.5 40.6  MCV 86.2 86.2  PLT 179 [id number]   Basic Metabolic Panel  Recent Labs  11/14/16 0427  NA 140  K 4.7  CL 108  CO2 24  GLUCOSE 134*  BUN 22*  CREATININE 1.13  CALCIUM 9.4   Liver Function Tests No results for input(s): AST, ALT, ALKPHOS, BILITOT, PROT, ALBUMIN in the last 72 hours. No results for input(s): LIPASE, AMYLASE in the last 72 hours. Cardiac Enzymes No results for input(s): CKTOTAL, CKMB, CKMBINDEX, TROPONINI in the last 72 hours. BNP Invalid input(s): POCBNP D-Dimer No results for input(s): DDIMER in the last 72 hours. Hemoglobin A1C No results for input(s): HGBA1C in the last 72 hours. Fasting Lipid  Panel No results for input(s): CHOL, HDL, LDLCALC, TRIG, CHOLHDL, LDLDIRECT in the last 72 hours. Thyroid Function Tests No results for input(s): TSH, T4TOTAL, T3FREE, THYROIDAB in the last 72 hours.  Invalid input(s): FREET3  Telemetry    Unable to review as patient seen in nuc med   ECG    N/A  Radiology    No results found.  Cardiac Studies   Cath 11/13/2016 Conclusion     Ost RPDA to RPDA lesion, 0 %stenosed.  Prox Cx to Mid Cx lesion, 0 %stenosed.  Prox LAD to Mid LAD lesion, 0 %stenosed.  Ost 1st Diag lesion, 80 %stenosed.  Mid RCA lesion, 60 %stenosed.     Patient Profile     Troy [name] is a 66 y.o. male with a history of 06/2015 DES mLAD & RPDA w/ VF arrest, PCI in [place] 2010, HTN, HLD, MR, PAF,  pulm nodule stable 07/2016 who seen in clinic for atrial flutter 11/06/16 noted in atrial flutter and started in cardizem CD 120 however not started due to  cost  who came to ER 11/11/15 for chest pain and being admitted.   Assessment & Plan    1.  Persistent atrial fibrillation with RVR - HR remains elevated.  Given LV dysfunction  IV Cardizem gtt stopped. Will increase Lopressor to 150mg  BID for better rate and BP control.  Eliquis restarted post cath.   CHASD2VASC score is 3.  TSH ok. No availability for TEE/DCCV today.  He is still not adequately rate controlled with HR 100-120's at rest.  Will increase Lopressor and try to get rate controlled over the weekend and plan for TEE/DCCV on Monday.  2.  Chest pain that sounds somewhat atypical and he has not had CP in the past with his CAD.  His anginal equivalent is SOB which has not really increased much recently.  Trop neg.  EKG nonischemic. Nuclear stress test high risk with apical ischemia and reduced LVF with EF 33%.   Cath yesterday showed patent stents.  LV dysfunction likely tachycardia induced .  3.  DCM  Tachycardia induced.  EF 33%.  Continue BB and ARB.  Add aldactone 12.5mg  daily.  Repeat echo in 2 months to make sure LVF has improved.  3.  ASCAD with history of cath 06/2015 with DES mLAD & RPDA w/ VF arrest during diagnostic portion, PCI in [place] 2010.  His typical anginal equivalent is SOB not CP.  Continue ASA/BB and statin.   4.  HTN - BP borderline controlled.  Increase lopressor to 150mg  BID and continue losartan 100mg  daily.  Add aldactone 12.5mg  daily.  5.  Hyperlipidemia - LDL goal < 70.   He was not on a statin at time of admission. He was on high dose lipitor as last OV with me but wanted to try an herbal approach. Restarted on Lipitor 80mg  daily and will need repeat FLP and ALT in 6 weeks.  6.  Mild to moderate MR by echo 06/2016  7.  PTSD - Xanax for anxiety  Will transfer to tele bed  Signed, Fransico Him, MD  11/14/2016, 10:38 AM

## 2016-11-14 NOTE — Progress Notes (Signed)
   TEE/DCCV scheduled for 11/17/2016 at 1300 with Dr. Aundra Dubin. NPO after midnight leading up to the procedure.   Signed, Erma Heritage, PA-C 11/14/2016, 11:36 AM

## 2016-11-14 NOTE — Discharge Instructions (Addendum)
Information on my medicine - ELIQUIS (apixaban)  This medication education was reviewed with me or my healthcare representative as part of my discharge preparation.  The pharmacist that spoke with me during my hospital stay was:  Cheral Almas, Student-PharmD; Gwenlyn Perking, PharmD  Why was Eliquis prescribed for you? Eliquis was prescribed for you to reduce the risk of a blood clot forming that can cause a stroke if you have a medical condition called atrial fibrillation (a type of irregular heartbeat).  What do You need to know about Eliquis ? Take your Eliquis TWICE DAILY - one tablet in the morning and one tablet in the evening with or without food. If you have difficulty swallowing the tablet whole please discuss with your pharmacist how to take the medication safely.  Take Eliquis exactly as prescribed by your doctor and DO NOT stop taking Eliquis without talking to the doctor who prescribed the medication.  Stopping may increase your risk of developing a stroke.  Refill your prescription before you run out.  After discharge, you should have regular check-up appointments with your healthcare provider that is prescribing your Eliquis.  In the future your dose may need to be changed if your kidney function or weight changes by a significant amount or as you get older.  What do you do if you miss a dose? If you miss a dose, take it as soon as you remember on the same day and resume taking twice daily.  Do not take more than one dose of ELIQUIS at the same time to make up a missed dose.  Important Safety Information A possible side effect of Eliquis is bleeding. You should call your healthcare provider right away if you experience any of the following: ? Bleeding from an injury or your nose that does not stop. ? Unusual colored urine (red or dark brown) or unusual colored stools (red or black). ? Unusual bruising for unknown reasons. ? A serious fall or if you hit your head (even  if there is no bleeding).  Some medicines may interact with Eliquis and might increase your risk of bleeding or clotting while on Eliquis. To help avoid this, consult your healthcare provider or pharmacist prior to using any new prescription or non-prescription medications, including herbals, vitamins, non-steroidal anti-inflammatory drugs (NSAIDs) and supplements.  This website has more information on Eliquis (apixaban): http://www.eliquis.com/eliquis/home   Information on my medicine - Coumadin   (Warfarin)  This medication education was reviewed with me or my healthcare representative as part of my discharge preparation.  The pharmacist that spoke with me during my hospital stay was:  Cheral Almas, Student-PharmD, Gwenlyn Perking, PharmD  Why was Coumadin prescribed for you? Coumadin was prescribed for you because you have a blood clot or a medical condition that can cause an increased risk of forming blood clots. Blood clots can cause serious health problems by blocking the flow of blood to the heart, lung, or brain. Coumadin can prevent harmful blood clots from forming. As a reminder your indication for Coumadin is:   Stroke Prevention Because Of Atrial Fibrillation  What test will check on my response to Coumadin? While on Coumadin (warfarin) you will need to have an INR test regularly to ensure that your dose is keeping you in the desired range. The INR (international normalized ratio) number is calculated from the result of the laboratory test called prothrombin time (PT).  If an INR APPOINTMENT HAS NOT ALREADY BEEN MADE FOR YOU please schedule an  appointment to have this lab work done by your health care provider within 7 days. Your INR goal is usually a number between:  2 to 3 or your provider may give you a more narrow range like 2-2.5.  Ask your health care provider during an office visit what your goal INR is.  What  do you need to  know  About  COUMADIN? Take Coumadin  (warfarin) exactly as prescribed by your healthcare provider about the same time each day.  DO NOT stop taking without talking to the doctor who prescribed the medication.  Stopping without other blood clot prevention medication to take the place of Coumadin may increase your risk of developing a new clot or stroke.  Get refills before you run out.  What do you do if you miss a dose? If you miss a dose, take it as soon as you remember on the same day then continue your regularly scheduled regimen the next day.  Do not take two doses of Coumadin at the same time.  Important Safety Information A possible side effect of Coumadin (Warfarin) is an increased risk of bleeding. You should call your healthcare provider right away if you experience any of the following: ? Bleeding from an injury or your nose that does not stop. ? Unusual colored urine (red or dark brown) or unusual colored stools (red or black). ? Unusual bruising for unknown reasons. ? A serious fall or if you hit your head (even if there is no bleeding).  Some foods or medicines interact with Coumadin (warfarin) and might alter your response to warfarin. To help avoid this: ? Eat a balanced diet, maintaining a consistent amount of Vitamin K. ? Notify your provider about major diet changes you plan to make. ? Avoid alcohol or limit your intake to 1 drink for women and 2 drinks for men per day. (1 drink is 5 oz. wine, 12 oz. beer, or 1.5 oz. liquor.)  Make sure that ANY health care provider who prescribes medication for you knows that you are taking Coumadin (warfarin).  Also make sure the healthcare provider who is monitoring your Coumadin knows when you have started a new medication including herbals and non-prescription products.  Coumadin (Warfarin)  Major Drug Interactions  Increased Warfarin Effect Decreased Warfarin Effect  Alcohol (large quantities) Antibiotics (esp. Septra/Bactrim, Flagyl, Cipro) Amiodarone  (Cordarone) Aspirin (ASA) Cimetidine (Tagamet) Megestrol (Megace) NSAIDs (ibuprofen, naproxen, etc.) Piroxicam (Feldene) Propafenone (Rythmol SR) Propranolol (Inderal) Isoniazid (INH) Posaconazole (Noxafil) Barbiturates (Phenobarbital) Carbamazepine (Tegretol) Chlordiazepoxide (Librium) Cholestyramine (Questran) Griseofulvin Oral Contraceptives Rifampin Sucralfate (Carafate) Vitamin K   Coumadin (Warfarin) Major Herbal Interactions  Increased Warfarin Effect Decreased Warfarin Effect  Garlic Ginseng Ginkgo biloba Coenzyme Q10 Green tea St. Johns wort    Coumadin (Warfarin) FOOD Interactions  Eat a consistent number of servings per week of foods HIGH in Vitamin K (1 serving =  cup)  Collards (cooked, or boiled & drained) Kale (cooked, or boiled & drained) Mustard greens (cooked, or boiled & drained) Parsley *serving size only =  cup Spinach (cooked, or boiled & drained) Swiss chard (cooked, or boiled & drained) Turnip greens (cooked, or boiled & drained)  Eat a consistent number of servings per week of foods MEDIUM-HIGH in Vitamin K (1 serving = 1 cup)  Asparagus (cooked, or boiled & drained) Broccoli (cooked, boiled & drained, or raw & chopped) Brussel sprouts (cooked, or boiled & drained) *serving size only =  cup Lettuce, raw (green leaf, endive, romaine) Spinach, raw Turnip  greens, raw & chopped   These websites have more information on Coumadin (warfarin):  FailFactory.se; VeganReport.com.au;  DO NOT MISS ELIQUIS VERY IMPORTANT

## 2016-11-14 NOTE — Progress Notes (Signed)
Attempted report 

## 2016-11-15 MED ORDER — SPIRONOLACTONE 25 MG PO TABS
12.5000 mg | ORAL_TABLET | Freq: Every day | ORAL | Status: DC
  Administered 2016-11-15: 12.5 mg via ORAL
  Filled 2016-11-15: qty 1

## 2016-11-15 NOTE — Progress Notes (Signed)
Patient Name: Troy [name] Date of Encounter: 11/15/2016  Primary Cardiologist: Dr. Sundra Aland Problem List     Active Problems:   CAD S/P percutaneous coronary angioplasty   Dyslipidemia   Essential (primary) hypertension   Chest pain with moderate risk of acute coronary syndrome   Atrial fibrillation with rapid ventricular response (HCC)   Chest pain   Unstable angina pectoris (HCC)     Subjective   No complaints.  Nuclear stress test high risk with apical ischemia but cath showed  Patent stents.    Inpatient Medications    Scheduled Meds: . apixaban  5 mg Oral BID  . aspirin EC  81 mg Oral Daily  . atorvastatin  80 mg Oral q1800  . losartan  100 mg Oral Daily  . metoprolol  150 mg Oral BID  . multivitamin with minerals  1 tablet Oral Daily  . niacin  100 mg Oral BID WC  . sodium chloride flush  3 mL Intravenous Q12H  . sodium chloride flush  3 mL Intravenous Q12H  . sodium chloride flush  3 mL Intravenous Q12H   Continuous Infusions:  PRN Meds: sodium chloride, acetaminophen, acetaminophen, ALPRAZolam, ondansetron (ZOFRAN) IV, ondansetron (ZOFRAN) IV, sodium chloride flush, sodium chloride flush, sodium chloride flush, zolpidem   Vital Signs    Vitals:   11/14/16 1100 11/14/16 1632 11/14/16 2120 11/15/16 0459  BP:  (!) 151/106 (!) 152/90 (!) 128/96  Pulse:  86 91 71  Resp:  18 18 18   Temp: 97.9 F (36.6 C) 97.6 F (36.4 C) 98.4 F (36.9 C) 98 F (36.7 C)  TempSrc: Oral Oral Oral Oral  SpO2:  98% 94% 96%  Weight:  241 lb (109.3 kg)    Height:  5\' 11"  (1.803 m)     No intake or output data in the 24 hours ending 11/15/16 0827 Filed Weights   11/10/16 1416 11/14/16 1632  Weight: 244 lb (110.7 kg) 241 lb (109.3 kg)    Physical Exam   GEN: Well nourished, well developed, in no acute distress.  HEENT: Grossly normal.  Neck: Supple, no JVD, carotid bruits, or masses. Cardiac: Ir Ir, no murmurs, rubs, or gallops. No clubbing, cyanosis, edema.   Radials/DP/PT 2+ and equal bilaterally.  Respiratory:  Respirations regular and unlabored, clear to auscultation bilaterally. GI: Soft, nontender, nondistended, BS + x 4. MS: no deformity or atrophy. Skin: warm and dry, no rash. Neuro:  Strength and sensation are intact. Psych: AAOx3.  Normal affect.  Labs    CBC  Recent Labs  11/13/16 0319 11/14/16 0427  WBC 6.5 10.0  HGB 13.5 13.8  HCT 40.5 40.6  MCV 86.2 86.2  PLT 179 [id number]   Basic Metabolic Panel  Recent Labs  11/14/16 0427  NA 140  K 4.7  CL 108  CO2 24  GLUCOSE 134*  BUN 22*  CREATININE 1.13  CALCIUM 9.4   Liver Function Tests No results for input(s): AST, ALT, ALKPHOS, BILITOT, PROT, ALBUMIN in the last 72 hours. No results for input(s): LIPASE, AMYLASE in the last 72 hours. Cardiac Enzymes No results for input(s): CKTOTAL, CKMB, CKMBINDEX, TROPONINI in the last 72 hours. BNP Invalid input(s): POCBNP D-Dimer No results for input(s): DDIMER in the last 72 hours. Hemoglobin A1C No results for input(s): HGBA1C in the last 72 hours. Fasting Lipid Panel No results for input(s): CHOL, HDL, LDLCALC, TRIG, CHOLHDL, LDLDIRECT in the last 72 hours. Thyroid Function Tests No results for input(s): TSH, T4TOTAL, T3FREE, THYROIDAB  in the last 72 hours.  Invalid input(s): FREET3  Telemetry    Unable to review as patient seen in nuc med   ECG    N/A  Radiology    No results found.  Cardiac Studies   Cath 11/13/2016 Conclusion     Ost RPDA to RPDA lesion, 0 %stenosed.  Prox Cx to Mid Cx lesion, 0 %stenosed.  Prox LAD to Mid LAD lesion, 0 %stenosed.  Ost 1st Diag lesion, 80 %stenosed.  Mid RCA lesion, 60 %stenosed.     Patient Profile     Troy [name] is a 66 y.o. male with a history of 06/2015 DES mLAD & RPDA w/ VF arrest, PCI in [place] 2010, HTN, HLD, MR, PAF,  pulm nodule stable 07/2016 who seen in clinic for atrial flutter 11/06/16 noted in atrial flutter and started in cardizem CD 120  however not started due to cost  who came to ER 11/11/15 for chest pain and being admitted.   Assessment & Plan    1.  Persistent atrial fibrillation with RVR - HR improved in the 90's at rest.  Suspect that anxiety is driving some of the elevated HR.  Given LV dysfunction  IV Cardizem gtt stopped. Continue  Lopressor  150mg  BID.  Eliquis restarted post cath.   CHASD2VASC score is 3.  TSH ok. Plan for TEE/DCCV on Monday.  2.  Chest pain that sounds somewhat atypical and he has not had CP in the past with his CAD.  His anginal equivalent is SOB which has not really increased much recently.  Trop neg.  EKG nonischemic. Nuclear stress test high risk with apical ischemia and reduced LVF with EF 33%.   Cath showed patent stents.  LV dysfunction likely tachycardia induced .  3.  DCM  Tachycardia induced.  EF 33%.  Continue BB and ARB.  Add aldactone 12.5mg  daily.  Repeat echo in 2 months to make sure LVF has improved.  3.  ASCAD with history of cath 06/2015 with DES mLAD & RPDA w/ VF arrest during diagnostic portion, PCI in [place] 2010.  His typical anginal equivalent is SOB not CP.  Continue ASA/BB and statin.   4.  HTN - BP borderline controlled. Continue lopressor  150mg  BID and losartan 100mg  daily.  Add aldactone 12.5mg  daily.  5.  Hyperlipidemia - LDL goal < 70.   He was not on a statin at time of admission. He was on high dose lipitor as last OV with me but wanted to try an herbal approach. Restarted on Lipitor 80mg  daily and will need repeat FLP and ALT in 6 weeks.  6.  Mild to moderate MR by echo 06/2016  7.  PTSD - Xanax for anxiety  Will transfer to tele bed  Signed, Fransico Him, MD  11/15/2016, 8:27 AM

## 2016-11-16 LAB — BASIC METABOLIC PANEL
Anion gap: 8 (ref 5–15)
BUN: 20 mg/dL (ref 6–20)
CHLORIDE: 106 mmol/L (ref 101–111)
CO2: 25 mmol/L (ref 22–32)
Calcium: 9.1 mg/dL (ref 8.9–10.3)
Creatinine, Ser: 1.06 mg/dL (ref 0.61–1.24)
GFR calc Af Amer: >60 mL/min (ref >60)
GFR calc non Af Amer: >60 mL/min (ref >60)
GLUCOSE: 101 mg/dL — AB (ref 65–99)
POTASSIUM: 3.9 mmol/L (ref 3.5–5.1)
Sodium: 139 mmol/L (ref 135–145)

## 2016-11-16 MED ORDER — SPIRONOLACTONE 25 MG PO TABS
25.0000 mg | ORAL_TABLET | Freq: Every day | ORAL | Status: DC
  Administered 2016-11-16 – 2016-11-17 (×2): 25 mg via ORAL
  Filled 2016-11-16 (×2): qty 1

## 2016-11-16 NOTE — Progress Notes (Signed)
Patient Name: Siddiq Maharrey Date of Encounter: 11/16/2016  Primary Cardiologist: Dr. Sundra Aland Problem List     Active Problems:   CAD S/P percutaneous coronary angioplasty   Dyslipidemia   Essential (primary) hypertension   Chest pain with moderate risk of acute coronary syndrome   Atrial fibrillation with rapid ventricular response (HCC)   Chest pain   Unstable angina pectoris (HCC)     Subjective   No complaints.  Nuclear stress test high risk with apical ischemia but cath showed  Patent stents.  Still in afib.  Plan for TEE/DCCV tomorrow.    Inpatient Medications    Scheduled Meds: . apixaban  5 mg Oral BID  . aspirin EC  81 mg Oral Daily  . atorvastatin  80 mg Oral q1800  . losartan  100 mg Oral Daily  . metoprolol  150 mg Oral BID  . multivitamin with minerals  1 tablet Oral Daily  . niacin  100 mg Oral BID WC  . sodium chloride flush  3 mL Intravenous Q12H  . sodium chloride flush  3 mL Intravenous Q12H  . sodium chloride flush  3 mL Intravenous Q12H  . spironolactone  12.5 mg Oral Daily   Continuous Infusions:  PRN Meds: sodium chloride, acetaminophen, acetaminophen, ALPRAZolam, ondansetron (ZOFRAN) IV, ondansetron (ZOFRAN) IV, sodium chloride flush, sodium chloride flush, sodium chloride flush, zolpidem   Vital Signs    Vitals:   11/15/16 0459 11/15/16 1040 11/15/16 2123 11/16/16 0516  BP: (!) 128/96 (!) 138/93 (!) 139/95 (!) 133/94  Pulse: 71 90 72 67  Resp: 18  16 18   Temp: 98 F (36.7 C)  98.1 F (36.7 C) 97.7 F (36.5 C)  TempSrc: Oral  Oral Oral  SpO2: 96%  99% 98%  Weight:      Height:        Intake/Output Summary (Last 24 hours) at 11/16/16 0826 Last data filed at 11/16/16 0811  Gross per 24 hour  Intake              680 ml  Output             1875 ml  Net            -1195 ml   Filed Weights   11/10/16 1416 11/14/16 1632  Weight: 244 lb (110.7 kg) 241 lb (109.3 kg)    Physical Exam   GEN: Well nourished, well  developed, in no acute distress.  HEENT: Grossly normal.  Neck: Supple, no JVD, carotid bruits, or masses. Cardiac: Ir Ir, no murmurs, rubs, or gallops. No clubbing, cyanosis, edema.  Radials/DP/PT 2+ and equal bilaterally.  Respiratory:  Respirations regular and unlabored, clear to auscultation bilaterally. GI: Soft, nontender, nondistended, BS + x 4. MS: no deformity or atrophy. Skin: warm and dry, no rash. Neuro:  Strength and sensation are intact. Psych: AAOx3.  Normal affect.  Labs    CBC  Recent Labs  11/14/16 0427  WBC 10.0  HGB 13.8  HCT 40.6  MCV 86.2  PLT Q000111Q   Basic Metabolic Panel  Recent Labs  11/14/16 0427  NA 140  K 4.7  CL 108  CO2 24  GLUCOSE 134*  BUN 22*  CREATININE 1.13  CALCIUM 9.4   Liver Function Tests No results for input(s): AST, ALT, ALKPHOS, BILITOT, PROT, ALBUMIN in the last 72 hours. No results for input(s): LIPASE, AMYLASE in the last 72 hours. Cardiac Enzymes No results for input(s): CKTOTAL, CKMB, CKMBINDEX, TROPONINI in  the last 72 hours. BNP Invalid input(s): POCBNP D-Dimer No results for input(s): DDIMER in the last 72 hours. Hemoglobin A1C No results for input(s): HGBA1C in the last 72 hours. Fasting Lipid Panel No results for input(s): CHOL, HDL, LDLCALC, TRIG, CHOLHDL, LDLDIRECT in the last 72 hours. Thyroid Function Tests No results for input(s): TSH, T4TOTAL, T3FREE, THYROIDAB in the last 72 hours.  Invalid input(s): FREET3  Telemetry    Unable to review as patient seen in nuc med   ECG    N/A  Radiology    No results found.  Cardiac Studies   Cath 11/13/2016 Conclusion     Ost RPDA to RPDA lesion, 0 %stenosed.  Prox Cx to Mid Cx lesion, 0 %stenosed.  Prox LAD to Mid LAD lesion, 0 %stenosed.  Ost 1st Diag lesion, 80 %stenosed.  Mid RCA lesion, 60 %stenosed.     Patient Profile     Viktor Giangregorio is a 66 y.o. male with a history of 06/2015 DES mLAD & RPDA w/ VF arrest, PCI in [place] 2010, HTN,  HLD, MR, PAF,  pulm nodule stable 07/2016 who seen in clinic for atrial flutter 11/06/16 noted in atrial flutter and started in cardizem CD 120 however not started due to cost  who came to ER 11/11/15 for chest pain and being admitted.   Assessment & Plan    1.  Persistent atrial fibrillation with RVR - HR improved in the 90's at rest.  Suspect that anxiety is driving some of the elevated HR.  Given LV dysfunction  IV Cardizem gtt stopped. Continue  Lopressor  150mg  BID.  Eliquis restarted post cath.   CHASD2VASC score is 3.  TSH ok. Plan for TEE/DCCV on Monday.  2.  Chest pain that sounds somewhat atypical and he has not had CP in the past with his CAD.  His anginal equivalent is SOB which has not really increased much recently.  Trop neg.  EKG nonischemic. Nuclear stress test high risk with apical ischemia and reduced LVF with EF 33%.  Cath showed patent stents.  LV dysfunction likely tachycardia induced .  3.  DCM  Tachycardia induced.  EF 33%.  Continue BB and ARB.  Added aldactone.  Repeat echo in 2 months to make sure LVF has improved.  3.  ASCAD with history of cath 06/2015 with DES mLAD & RPDA w/ VF arrest during diagnostic portion, PCI in [place] 2010.  His typical anginal equivalent is SOB not CP.  Continue ASA/BB and statin.   4.  HTN - BP borderline controlled. Continue lopressor  150mg  BID and losartan 100mg  daily.  Increase aldactone to 25mg  daily.  5.  Hyperlipidemia - LDL goal < 70.   He was not on a statin at time of admission. He was on high dose lipitor as last OV with me but wanted to try an herbal approach. Restarted on Lipitor 80mg  daily and will need repeat FLP and ALT in 6 weeks.  6.  Mild to moderate MR by echo 06/2016  7.  PTSD - Xanax for anxiety    Signed, Fransico Him, MD  11/16/2016, 8:26 AM

## 2016-11-17 ENCOUNTER — Inpatient Hospital Stay (HOSPITAL_COMMUNITY): Payer: Medicare Other | Admitting: Certified Registered"

## 2016-11-17 ENCOUNTER — Encounter (HOSPITAL_COMMUNITY): Payer: Self-pay | Admitting: *Deleted

## 2016-11-17 ENCOUNTER — Inpatient Hospital Stay (HOSPITAL_COMMUNITY): Payer: Medicare Other

## 2016-11-17 ENCOUNTER — Encounter (HOSPITAL_COMMUNITY)
Admission: EM | Disposition: A | Discharge status: Home / Self Care | Payer: Self-pay | Source: Home / Self Care | Attending: Cardiology

## 2016-11-17 DIAGNOSIS — I340 Nonrheumatic mitral (valve) insufficiency: Secondary | ICD-10-CM

## 2016-11-17 DIAGNOSIS — I4891 Unspecified atrial fibrillation: Secondary | ICD-10-CM

## 2016-11-17 DIAGNOSIS — I481 Persistent atrial fibrillation: Principal | ICD-10-CM

## 2016-11-17 HISTORY — PX: TEE WITHOUT CARDIOVERSION: SHX5443

## 2016-11-17 HISTORY — PX: CARDIOVERSION: SHX1299

## 2016-11-17 LAB — CBC
HEMATOCRIT: 44.7 % (ref 39.0–52.0)
Hemoglobin: 15.2 g/dL (ref 13.0–17.0)
MCH: 29.3 pg (ref 26.0–34.0)
MCHC: 34 g/dL (ref 30.0–36.0)
MCV: 86.1 fL (ref 78.0–100.0)
PLATELETS: 176 10*3/uL (ref 150–400)
RBC: 5.19 MIL/uL (ref 4.22–5.81)
RDW: 13.1 % (ref 11.5–15.5)
WBC: 6.1 10*3/uL (ref 4.0–10.5)

## 2016-11-17 LAB — BASIC METABOLIC PANEL
Anion gap: 7 (ref 5–15)
BUN: 19 mg/dL (ref 6–20)
CO2: 27 mmol/L (ref 22–32)
Calcium: 9.1 mg/dL (ref 8.9–10.3)
Chloride: 106 mmol/L (ref 101–111)
Creatinine, Ser: 1.03 mg/dL (ref 0.61–1.24)
Glucose, Bld: 101 mg/dL — ABNORMAL HIGH (ref 65–99)
Potassium: 4.2 mmol/L (ref 3.5–5.1)
SODIUM: 140 mmol/L (ref 135–145)

## 2016-11-17 SURGERY — ECHOCARDIOGRAM, TRANSESOPHAGEAL
Anesthesia: General

## 2016-11-17 MED ORDER — ACETAMINOPHEN 325 MG PO TABS: 650.0000 mg | ORAL_TABLET | ORAL | Status: DC | PRN

## 2016-11-17 MED ORDER — PROPOFOL 10 MG/ML IV BOLUS
INTRAVENOUS | Status: AC
  Filled 2016-11-17: qty 20

## 2016-11-17 MED ORDER — SPIRONOLACTONE 25 MG PO TABS: 25.0000 mg | ORAL_TABLET | Freq: Every day | ORAL | 6 refills | Status: DC

## 2016-11-17 MED ORDER — LIDOCAINE 2% (20 MG/ML) 5 ML SYRINGE
INTRAMUSCULAR | Status: AC
  Filled 2016-11-17: qty 5

## 2016-11-17 MED ORDER — PROPOFOL 500 MG/50ML IV EMUL
INTRAVENOUS | Status: DC | PRN
  Administered 2016-11-17: 75 ug/kg/min via INTRAVENOUS

## 2016-11-17 MED ORDER — PROPOFOL 10 MG/ML IV BOLUS
INTRAVENOUS | Status: DC | PRN
  Administered 2016-11-17: 20 mg via INTRAVENOUS
  Administered 2016-11-17: 30 mg via INTRAVENOUS
  Administered 2016-11-17: 20 mg via INTRAVENOUS
  Administered 2016-11-17: 10 mg via INTRAVENOUS
  Administered 2016-11-17 (×2): 20 mg via INTRAVENOUS

## 2016-11-17 MED ORDER — FENTANYL CITRATE (PF) 100 MCG/2ML IJ SOLN
INTRAMUSCULAR | Status: AC
  Filled 2016-11-17: qty 2

## 2016-11-17 MED ORDER — METOPROLOL TARTRATE 100 MG PO TABS: 100.0000 mg | ORAL_TABLET | Freq: Two times a day (BID) | ORAL | Status: DC

## 2016-11-17 MED ORDER — BUTAMBEN-TETRACAINE-BENZOCAINE 2-2-14 % EX AERO
INHALATION_SPRAY | CUTANEOUS | Status: DC | PRN
  Administered 2016-11-17: 2 via TOPICAL

## 2016-11-17 MED ORDER — ATORVASTATIN CALCIUM 80 MG PO TABS: 80.0000 mg | ORAL_TABLET | Freq: Every day | ORAL | 6 refills | Status: DC

## 2016-11-17 MED ORDER — MIDAZOLAM HCL 2 MG/2ML IJ SOLN
INTRAMUSCULAR | Status: AC
  Filled 2016-11-17: qty 2

## 2016-11-17 NOTE — Anesthesia Preprocedure Evaluation (Addendum)
Anesthesia Evaluation  Patient identified by MRN, date of birth, ID band Patient awake    Reviewed: Allergy & Precautions, H&P , NPO status , Patient's Chart, lab work & pertinent test results, reviewed documented beta blocker date and time   Airway Mallampati: III  TM Distance: >3 FB Neck ROM: Full    Dental no notable dental hx. (+) Dental Advisory Given, Teeth Intact   Pulmonary sleep apnea and Continuous Positive Airway Pressure Ventilation ,    Pulmonary exam normal breath sounds clear to auscultation       Cardiovascular hypertension, Pt. on medications and Pt. on home beta blockers + CAD and + Cardiac Stents  + dysrhythmias Atrial Fibrillation  Rhythm:Irregular Rate:Tachycardia     Neuro/Psych negative neurological ROS  negative psych ROS   GI/Hepatic negative GI ROS, Neg liver ROS,   Endo/Other  negative endocrine ROS  Renal/GU negative Renal ROS  negative genitourinary   Musculoskeletal   Abdominal   Peds  Hematology negative hematology ROS (+)   Anesthesia Other Findings   Reproductive/Obstetrics negative OB ROS                            Anesthesia Physical Anesthesia Plan  ASA: III  Anesthesia Plan: General   Post-op Pain Management:    Induction: Intravenous  Airway Management Planned: Nasal Cannula  Additional Equipment:   Intra-op Plan:   Post-operative Plan:   Informed Consent: I have reviewed the patients History and Physical, chart, labs and discussed the procedure including the risks, benefits and alternatives for the proposed anesthesia with the patient or authorized representative who has indicated his/her understanding and acceptance.   Dental advisory given  Plan Discussed with: CRNA  Anesthesia Plan Comments:         Anesthesia Quick Evaluation

## 2016-11-17 NOTE — Progress Notes (Addendum)
Patient Name: Troy [name] Date of Encounter: 11/17/2016  Primary Cardiologist: Dr. Radford Pax  Patient Profile     Lem Lans is a 66 y.o. male with a history of 06/2015 DES mLAD & RPDA w/ VF arrest, PCI in [place] 2010, HTN, HLD, MR, PAF,  pulm nodule stable 07/2016 who seen in clinic for atrial flutter 11/06/16 noted in atrial flutter and started in cardizem CD 120 however not started due to cost  who came to ER 11/11/15 for chest pain and being admitted.  Hospital Problem List     Principal Problem:   Persistent atrial fibrillation with RVR (HCC) Active Problems:   CAD S/P percutaneous coronary angioplasty   Atrial fibrillation with rapid ventricular response (HCC)   Essential (primary) hypertension   Dyslipidemia   Chest pain with moderate risk of acute coronary syndrome    Subjective   No complaints.   Nuclear stress test high risk with apical ischemia but cath showed  Patent stents.   Still in afib.  Plan for TEE/DCCV today.    Inpatient Medications    Scheduled Meds: . apixaban  5 mg Oral BID  . aspirin EC  81 mg Oral Daily  . atorvastatin  80 mg Oral q1800  . losartan  100 mg Oral Daily  . metoprolol  150 mg Oral BID  . multivitamin with minerals  1 tablet Oral Daily  . niacin  100 mg Oral BID WC  . sodium chloride flush  3 mL Intravenous Q12H  . sodium chloride flush  3 mL Intravenous Q12H  . sodium chloride flush  3 mL Intravenous Q12H  . spironolactone  25 mg Oral Daily   Continuous Infusions:  PRN Meds: sodium chloride, acetaminophen, acetaminophen, ALPRAZolam, ondansetron (ZOFRAN) IV, ondansetron (ZOFRAN) IV, sodium chloride flush, sodium chloride flush, sodium chloride flush, zolpidem   Vital Signs    Vitals:   11/15/16 2123 11/16/16 0516 11/16/16 2017 11/17/16 0610  BP: (!) 139/95 (!) 133/94 131/88 140/85  Pulse: 72 67 83 (!) 43  Resp: 16 18 18 18   Temp: 98.1 F (36.7 C) 97.7 F (36.5 C) 98.6 F (37 C) 97.5 F (36.4 C)  TempSrc: Oral Oral Oral  Oral  SpO2: 99% 98% 97% 98%  Weight:      Height:        Intake/Output Summary (Last 24 hours) at 11/17/16 0846 Last data filed at 11/17/16 0612  Gross per 24 hour  Intake              720 ml  Output             1375 ml  Net             -655 ml   Filed Weights   11/10/16 1416 11/14/16 1632  Weight: 110.7 kg (244 lb) 109.3 kg (241 lb)    Physical Exam   GEN: Well nourished, well developed, in no acute distress.  HEENT: Grossly normal.  Neck: Supple, no JVD, carotid bruits, or masses. Cardiac: Tachy; Ir Ir, no murmurs, rubs, or gallops. No clubbing, cyanosis, edema.  Radials/DP/PT 2+ and equal bilaterally.  Respiratory:  Respirations regular and unlabored, clear to auscultation bilaterally. GI: Soft, nontender, nondistended, BS + x 4. MS: no deformity or atrophy. Skin: warm and dry, no rash. Psych: AAOx3.  Normal affect.  Labs    CBC No results for input(s): WBC, NEUTROABS, HGB, HCT, MCV, PLT in the last 72 hours. Basic Metabolic Panel  Recent Labs  11/16/16 1025  NA 139  K 3.9  CL 106  CO2 25  GLUCOSE 101*  BUN 20  CREATININE 1.06  CALCIUM 9.1   Liver Function Tests No results for input(s): AST, ALT, ALKPHOS, BILITOT, PROT, ALBUMIN in the last 72 hours. No results for input(s): LIPASE, AMYLASE in the last 72 hours. Cardiac Enzymes No results for input(s): CKTOTAL, CKMB, CKMBINDEX, TROPONINI in the last 72 hours. BNP Invalid input(s): POCBNP D-Dimer No results for input(s): DDIMER in the last 72 hours. Hemoglobin A1C No results for input(s): HGBA1C in the last 72 hours. Fasting Lipid Panel No results for input(s): CHOL, HDL, LDLCALC, TRIG, CHOLHDL, LDLDIRECT in the last 72 hours. Thyroid Function Tests No results for input(s): TSH, T4TOTAL, T3FREE, THYROIDAB in the last 72 hours.  Invalid input(s): FREET3  Telemetry    Unable to review as patient seen in nuc med   ECG    N/A  Radiology    No results found.  Cardiac Studies   Cath  11/13/2016 Conclusion     Ost RPDA to RPDA lesion, 0 %stenosed.  Prox Cx to Mid Cx lesion, 0 %stenosed.  Prox LAD to Mid LAD lesion, 0 %stenosed.  Ost 1st Diag lesion, 80 %stenosed.  Mid RCA lesion, 60 %stenosed.    Assessment & Plan    1.  Persistent atrial fibrillation with RVR - Suspect that anxiety is driving some of the elevated HR.  Given LV dysfunction  IV Cardizem gtt stopped. Continue  Lopressor  150mg  BID.  Eliquis restarted post cath.   CHASD2VASC score is 3.  TSH ok.   Recorded HR as low as 43, but currently 103.  Plan for TEE/DCCV on today.  2. Atypical Chest pain (? Related to tachycardia) --  His anginal equivalent is SOB which has not really increased much recently.  Trop neg.  EKG nonischemic. Nuclear stress test high risk with apical ischemia and reduced LVF with EF 33%.  Cath showed patent stents.  LV dysfunction likely tachycardia induced.  3.  DCM  Tachycardia induced as CAD is stable.  EF 33%.  Continue BB and ARB.  Added aldactone.  Repeat echo in 2 months to make sure LVF has improved.  3.  ASCAD with history of cath 06/2015 with DES mLAD & RPDA w/ VF arrest during diagnostic portion, PCI in [place] 2010.  His typical anginal equivalent is SOB not CP.  Continue ASA/BB and statin.   4.  HTN - BP borderline controlled. Continue lopressor 150mg  BID and losartan 100mg  daily.  Increase aldactone to 25mg  daily.  Recheck chem panel in today.  5.  Hyperlipidemia - LDL goal < 70.   He was not on a statin at time of admission. He was on high dose lipitor as last OV with me but wanted to try an herbal approach. Restarted on Lipitor 80mg  daily and will need repeat FLP and ALT in 6 weeks.  6.  Mild to moderate MR by echo 06/2016  7.  PTSD - Xanax for anxiety  Would monitor this PM following cardioversion to ensure stable HR & BP - Expected d/c in later today.  Signed, Glenetta Hew, MD  11/17/2016, 8:46 AM

## 2016-11-17 NOTE — CV Procedure (Addendum)
Procedure: TEE  Indication: Atrial fibrillation  Sedation: Propofol per anesthesiology  Findings: Please see echo section for full report.  Normal LV size with mild LV hypertrophy.  EF 50% with diffuse hypokinesis.  Normal RV size with mildly decreased systolic function.  Moderate left atrial enlargement, no LAA thrombus.  Normal right atrium.  No PFO/ASD, negative bubble study.  Mild mitral regurgitation, trivial tricuspid regurgitation.  Trileaflet aortic valve with trivial regurgitation, no stenosis.  Normal caliber aorta with minimal plaque.  May proceed to DCCV.  Troy [name] 11/17/2016 11:07 AM

## 2016-11-17 NOTE — Transfer of Care (Signed)
Immediate Anesthesia Transfer of Care Note  Patient: Troy [name]  Procedure(s) Performed: Procedure(s): TRANSESOPHAGEAL ECHOCARDIOGRAM (TEE) (N/A) CARDIOVERSION (N/A)  Patient Location: Endoscopy Unit  Anesthesia Type:MAC  Level of Consciousness: lethargic and responds to stimulation  Airway & Oxygen Therapy: Patient Spontanous Breathing and Patient connected to nasal cannula oxygen  Post-op Assessment: Report given to RN  Post vital signs: Reviewed and stable  Last Vitals:  Vitals:   11/17/16 1017 11/17/16 1116  BP: (!) 185/136 103/84  Pulse:  63  Resp: 14 11  Temp: 36.7 C 36.6 C    Last Pain:  Vitals:   11/17/16 1116  TempSrc: Oral  PainSc:          Complications: No apparent anesthesia complications

## 2016-11-17 NOTE — Progress Notes (Signed)
  Echocardiogram Echocardiogram Transesophageal has been performed.  Troy [name] 11/17/2016, 11:15 AM

## 2016-11-17 NOTE — Anesthesia Postprocedure Evaluation (Signed)
Anesthesia Post Note  Patient: Troy [name]  Procedure(s) Performed: Procedure(s) (LRB): TRANSESOPHAGEAL ECHOCARDIOGRAM (TEE) (N/A) CARDIOVERSION (N/A)  Patient location during evaluation: PACU Anesthesia Type: General Level of consciousness: awake and alert Pain management: pain level controlled Vital Signs Assessment: post-procedure vital signs reviewed and stable Respiratory status: spontaneous breathing, nonlabored ventilation and respiratory function stable Cardiovascular status: blood pressure returned to baseline and stable Postop Assessment: no signs of nausea or vomiting Anesthetic complications: no       Last Vitals:  Vitals:   11/17/16 1120 11/17/16 1130  BP: 117/88 92/73  Pulse: (!) 59 60  Resp: 16 15  Temp:      Last Pain:  Vitals:   11/17/16 1116  TempSrc: Oral  PainSc:                  Milford Cilento,W. EDMOND

## 2016-11-17 NOTE — Procedures (Signed)
Electrical Cardioversion Procedure Note Troy [name] [id number] 03-27-1951  Procedure: Electrical Cardioversion Indications:  Atrial Fibrillation.  Patient is on Eliquis, TEE with no LAA thrombus.   Procedure Details Consent: Risks of procedure as well as the alternatives and risks of each were explained to the (patient/caregiver).  Consent for procedure obtained. Time Out: Verified patient identification, verified procedure, site/side was marked, verified correct patient position, special equipment/implants available, medications/allergies/relevent history reviewed, required imaging and test results available.  Performed  Patient placed on cardiac monitor, pulse oximetry, supplemental oxygen as necessary.  Sedation given: Propofol per anesthesiology Pacer pads placed anterior and posterior chest.  Cardioverted 1 time(s).  Cardioverted at [place].  Evaluation Findings: Post procedure EKG shows: NSR Complications: None Patient did tolerate procedure well.   Loralie Champagne 11/17/2016, 11:07 AM

## 2016-11-17 NOTE — Anesthesia Procedure Notes (Signed)
Procedure Name: MAC Date/Time: 11/17/2016 10:38 AM Performed by: Barrington Ellison Pre-anesthesia Checklist: Patient identified, Emergency Drugs available, Suction available, Patient being monitored and Timeout performed Patient Re-evaluated:Patient Re-evaluated prior to inductionOxygen Delivery Method: Nasal cannula

## 2016-11-17 NOTE — Progress Notes (Signed)
Patient given discharge instructions, medication list and follow up appointments.  Exit care documents given on stroke, atrial fib, and eliquis. Patient verbalized understanding. IV and tele dcd. Will discharge home as ordered Daniele Yankowski, Bettina Gavia RN

## 2016-11-17 NOTE — Discharge Summary (Signed)
Physician Discharge Summary       Patient ID: Britney Bett MRN: [id number] DOB/AGE: 12-22-1950 66 y.o.  Admit date: 11/10/2016 Discharge date: 11/17/2016 Primary Cardiologist:Dr. Turner   Discharge Diagnoses:  Principal Problem:   Persistent atrial fibrillation with RVR ([place]), successful TEE DCCV 11/17/16 Active Problems:   CAD S/P percutaneous coronary angioplasty   Dyslipidemia   Essential (primary) hypertension   Chest pain with moderate risk of acute coronary syndrome   Atrial fibrillation with rapid ventricular response ([place])   Discharged Condition: good  Procedures:   Nuc study 11/11/16  IMPRESSION: 1. Small focus ischemia at the apex.  2. Global hypokinesia  3. Left ventricular ejection fraction 33%  4. Non invasive risk stratification*: High (risk categorization based on low ejection fraction).  Cath by Dr. Gwenlyn Found 11/13/16   Left Heart Cath and Coronary Angiography  Conclusion     Ost RPDA to RPDA lesion, 0 %stenosed.  Prox Cx to Mid Cx lesion, 0 %stenosed.  Prox LAD to Mid LAD lesion, 0 %stenosed.  Ost 1st Diag lesion, 80 %stenosed.  Mid RCA lesion, 60 %stenosed.        11/17/16 TEE: Normal LV size with mild LV hypertrophy.  EF 50% with diffuse hypokinesis.  Normal RV size with mildly decreased systolic function.  Moderate left atrial enlargement, no LAA thrombus.  Normal right atrium.  No PFO/ASD, negative bubble study.  Mild mitral regurgitation, trivial tricuspid regurgitation.  Trileaflet aortic valve with trivial regurgitation, no stenosis.  Normal caliber aorta with minimal plaque.   DCCV: Cardioverted 1 time(s).  Cardioverted at [place].  Evaluation Findings: Post procedure EKG shows: NSR Complications: None Patient did tolerate procedure well.  Hospital Course:  66 y.o.malewith a history of 06/2015 DES mLAD & RPDA w/ VF arrest, PCI in [place] 2010, HTN, HLD, MR, PAF,  pulm nodule stable 07/2016 who seen in clinic for atrial flutter  11/06/16 noted in atrial flutter and started in cardizem CD 120 however not started due to cost  who came to ER 11/10/16 for chest pain and was admitted. He was on Eliquis. He did have chest pain but atypical.    He had + nuc study and underwent nuc study that was +.  This led to cardiac cath.  And he had non obstructive disease.    On 11/17/16 pt underwent TEE/DCCV without problems. This afternoon he was stable and ready for discharge. He has been seen today by Dr. Ellyn Hack and found stable for discharge.  He has a follow up appt with Dr. Radford Pax.  He will need BMP on that visit.  Consults: None  Significant Diagnostic Studies:  BMP Latest Ref Rng & Units 11/17/2016 11/16/2016 11/14/2016  Glucose 65 - 99 mg/dL 101(H) 101(H) 134(H)  BUN 6 - 20 mg/dL 19 20 22(H)  Creatinine 0.61 - 1.24 mg/dL 1.03 1.06 1.13  Sodium 135 - 145 mmol/L 140 139 140  Potassium 3.5 - 5.1 mmol/L 4.2 3.9 4.7  Chloride 101 - 111 mmol/L 106 106 108  CO2 22 - 32 mmol/L 27 25 24   Calcium 8.9 - 10.3 mg/dL 9.1 9.1 9.4   CBC Latest Ref Rng & Units 11/17/2016 11/14/2016 11/13/2016  WBC 4.0 - 10.5 K/uL 6.1 10.0 6.5  Hemoglobin 13.0 - 17.0 g/dL 15.2 13.8 13.5  Hematocrit 39.0 - 52.0 % 44.7 40.6 40.5  Platelets 150 - 400 K/uL 176 182 179   EKG post procedure SR with PACs  Troponin 0.03 to 0.04   TSH 1.090  CHEST  2 VIEW  COMPARISON:  07/25/2016 CT  FINDINGS: The heart size and mediastinal contours are within normal limits. Mild atherosclerosis of the aortic arch. No aneurysm. Both lungs are clear. 12 mm central nodular density in the right lower lobe seen by CT is not apparent radiographically. The visualized skeletal structures are unremarkable.  IMPRESSION: No active cardiopulmonary disease.  Aortic atherosclerosis.  Discharge Exam: Blood pressure 120/62, pulse (!) 59, temperature 98.8 F (37.1 C), temperature source Oral, resp. rate 20, height 5\' 11"  (1.803 m), weight 241 lb (109.3 kg), SpO2 99 %.   Disposition:  01-Home or Self Care   Allergies as of 11/17/2016      Reactions   Contrast Media [iodinated Diagnostic Agents] Other (See Comments)   V-Fib arrest during cardiac cath suspected d/t contrast dye in 2016   Gluten Meal Other (See Comments)   REACTION: Celiac disease   Tizanidine Anxiety   Depression   [place] Other (See Comments)   REACTION: Celiac disease   Morphine And Related Other (See Comments)   REACTION: "REALLY BAD STOMACH PAIN"      Medication List    STOP taking these medications   diltiazem 120 MG 24 hr tablet Commonly known as:  CARDIZEM LA   oxymetazoline 0.05 % nasal spray Commonly known as:  AFRIN   warfarin 5 MG tablet Commonly known as:  COUMADIN     TAKE these medications   acetaminophen 325 MG tablet Commonly known as:  TYLENOL Take 2 tablets (650 mg total) by mouth every 4 (four) hours as needed for headache or mild pain.   atorvastatin 80 MG tablet Commonly known as:  LIPITOR Take 1 tablet (80 mg total) by mouth daily at 6 PM.   ELIQUIS 5 MG Tabs tablet Generic drug:  apixaban Take 5 mg by mouth 2 (two) times daily.   Fish Oil 1000 MG Cpdr Take 4,000 mg by mouth daily.   losartan 100 MG tablet Commonly known as:  COZAAR Take 1 tablet (100 mg total) by mouth daily.   metoprolol 100 MG tablet Commonly known as:  LOPRESSOR TAKE 1 TABLET BY MOUTH TWICE A DAY   multivitamin with minerals Tabs tablet Take 1 tablet by mouth daily.   niacin 100 MG tablet Take 100 mg by mouth 2 (two) times daily with a meal.   nitroGLYCERIN 0.4 MG SL tablet Commonly known as:  NITROSTAT Place 1 tablet (0.4 mg total) under the tongue every 5 (five) minutes as needed for chest pain.   spironolactone 25 MG tablet Commonly known as:  ALDACTONE Take 1 tablet (25 mg total) by mouth daily. Start taking on:  11/18/2016      Follow-up Information    Fransico Him, MD Follow up on 11/27/2016.   Specialty:  Cardiology Why:  at 9:45 AM Contact information: [id number]  [place] [address] [phone]            Discharge Instructions: call if any problems Do not miss eliquis.   Signed: Cecilie Kicks Nurse Practitioner-Certified  Medical Group: [facility] 11/17/2016, 4:13 PM  Time spent on discharge :>30 minutes.    Patient seen and evaluated earlier in the morning prior to cardioversion. Patient did well status post TEE cardioversion. Was in sinus rhythm and stable. He is ready for discharge per plan. See final progress note for details.   Glenetta Hew, M.D., M.S. Interventional Cardiologist   Pager # [phone] Phone # [phone] [address]. [place], [place] [address]

## 2016-11-17 NOTE — Care Management Important Message (Signed)
Important Message  Patient Details  Name: Troy [name] MRN: [id number] Date of Birth: January 21, 1951   Medicare Important Message Given:  Yes    Nathen May 11/17/2016, 12:48 PM

## 2016-11-18 ENCOUNTER — Ambulatory Visit (HOSPITAL_COMMUNITY): Payer: Medicare Other | Attending: Internal Medicine

## 2016-11-18 ENCOUNTER — Other Ambulatory Visit: Payer: Self-pay | Admitting: Cardiology

## 2016-11-18 ENCOUNTER — Other Ambulatory Visit: Payer: Self-pay

## 2016-11-18 DIAGNOSIS — R9439 Abnormal result of other cardiovascular function study: Secondary | ICD-10-CM | POA: Diagnosis not present

## 2016-11-18 DIAGNOSIS — I340 Nonrheumatic mitral (valve) insufficiency: Secondary | ICD-10-CM | POA: Insufficient documentation

## 2016-11-18 DIAGNOSIS — I517 Cardiomegaly: Secondary | ICD-10-CM | POA: Diagnosis not present

## 2016-11-18 DIAGNOSIS — I4891 Unspecified atrial fibrillation: Secondary | ICD-10-CM | POA: Insufficient documentation

## 2016-11-18 DIAGNOSIS — I2510 Atherosclerotic heart disease of native coronary artery without angina pectoris: Secondary | ICD-10-CM | POA: Diagnosis not present

## 2016-11-18 DIAGNOSIS — I361 Nonrheumatic tricuspid (valve) insufficiency: Secondary | ICD-10-CM | POA: Insufficient documentation

## 2016-11-18 DIAGNOSIS — I4819 Other persistent atrial fibrillation: Secondary | ICD-10-CM

## 2016-11-18 DIAGNOSIS — I351 Nonrheumatic aortic (valve) insufficiency: Secondary | ICD-10-CM | POA: Diagnosis not present

## 2016-11-18 DIAGNOSIS — I481 Persistent atrial fibrillation: Secondary | ICD-10-CM | POA: Diagnosis not present

## 2016-11-19 ENCOUNTER — Telehealth: Payer: Self-pay | Admitting: Cardiology

## 2016-11-19 MED ORDER — ISOSORBIDE MONONITRATE ER 30 MG PO TB24: 30.0000 mg | ORAL_TABLET | Freq: Every day | ORAL | 11 refills | Status: DC

## 2016-11-19 NOTE — Telephone Encounter (Signed)
Pt c/o of Chest Pain: 1. Are you having CP right now? Yes 2. Are you experiencing any other symptoms (ex. SOB, nausea, vomiting, sweating)?No 3. How long have you been experiencing CP? Since around 9 this morning,he woke up with it 4. Is your CP continuous or coming and going? continuously 5. Have you taken Nitroglycerin? no

## 2016-11-19 NOTE — Telephone Encounter (Signed)
He has an 80% diag and 60% RCA by recent cath.  Stents were patent.  Please start Imdur 30mg  daily.  Please inform him to avoid Viagra or Cialis.  If pain does not resolve then needs to go to ER.  Please have him followup with extender in am.

## 2016-11-19 NOTE — Telephone Encounter (Signed)
Patient reports he feels much better after taking a strained BM this morning. His BP is back to normal and his CP resolved. Instructed patient to START IMDUR 30 mg daily. He understands not to take Viagra or Cialis. Scheduled patient to see Cecilie Kicks, NP tomorrow at 1130. Patient agrees with treatment plan.

## 2016-11-19 NOTE — Progress Notes (Signed)
Cardiology Office Note   Date:  11/20/2016   ID:  Troy [name], DOB 1951-07-27, MRN [id number]  PCP:  Troy Hatchet, MD  Cardiologist:  Dr. Radford Pax    Chief Complaint  Patient presents with  . Chest Pain      History of Present Illness: Troy [name] is a 66 y.o. male who presents for chest pain  a history of 06/2015 DES mLAD &RPDA w/ VF arrest, PCI in [place] 2010, HTN, HLD, MR, PAF, pulm nodule stable 07/2016 who seen in clinic for atrial flutter 11/06/16 noted in atrial flutter and started in cardizem CD 120 however not started due to cost who came to ER 11/10/16 for chest pain and was admitted. He was on Eliquis. He did have chest pain but atypical.    He had + nuc study and underwent nuc study that was +.  This led to cardiac cath.  And he had non obstructive disease.  Though 1st diag at 80% and very small.    On 11/17/16 pt underwent TEE/DCCV without problems.  Yesterday he called with chest pain that he had when he woke.  No associated symptoms, and pain lasted about an hour.  4-5/10- sharp pain.   He had called the office with the pain yesterday Imdur was ordered and instructed not to take any viagra.    Today he has had no further pain and has not yet had the imdur.  He feels well otherwise and remains in SR with PACs.    Past Medical History:  Diagnosis Date  . CAD (coronary artery disease)    a. s/p PCI in 2010 in [place]. b. Unstable angina/LHC A999333 complicated by V fib arrest after contrast injection. Cath 06/19/2015 s/p DES to mid LAD and RPDA.  Marland Kitchen Dyslipidemia   . Essential hypertension   . Lung nodule < 6cm on CT 06/16/15   Right lung  . Mitral regurgitation    a. Mild-mod by echo 06/2015.  Marland Kitchen PAF (paroxysmal atrial fibrillation) ([place])    a. 06/2015 noted to be in new a-fib when arrived with unstable angina. Converted to NSR after being shocked in the cath lab for vfib;  b. 06/2015 Eliquis initiated as PAF noted on event monitor.  . Pulmonary nodule 07/2015   a. 1.5 x 1.2 cm smoothly marginated nodule in the central aspect of the right lower lobe with recommendation correlation with nonemergent PET-CT to exclude a neoplasm , stable by CT 07/2016  . Sleep apnea, obstructive   . Ventricular fibrillation ([place])    a. occured during cath 06/18/2015.    Past Surgical History:  Procedure Laterality Date  . CARDIAC CATHETERIZATION N/A 06/18/2015   Procedure: Left Heart Cath and Coronary Angiography;  Surgeon: Burnell Blanks, MD;  Location: [facility];  Service: Cardiovascular;  Laterality: N/A;  . CARDIAC CATHETERIZATION N/A 06/19/2015   Procedure: Coronary Stent Intervention;  Surgeon: Peter M Martinique, MD;  Location: [facility];  Service: Cardiovascular;  Laterality: N/A;  . CARDIAC CATHETERIZATION N/A 11/13/2016   Procedure: Left Heart Cath and Coronary Angiography;  Surgeon: Lorretta Harp, MD;  Location: [facility];  Service: Cardiovascular;  Laterality: N/A;  . CARDIOVERSION N/A 11/17/2016   Procedure: CARDIOVERSION;  Surgeon: Larey Dresser, MD;  Location: [facility];  Service: Cardiovascular;  Laterality: N/A;  . CORONARY ANGIOPLASTY WITH STENT PLACEMENT    . REPLACEMENT TOTAL KNEE Left   . TEE WITHOUT CARDIOVERSION N/A 11/17/2016   Procedure: TRANSESOPHAGEAL ECHOCARDIOGRAM (TEE);  Surgeon: Larey Dresser, MD;  Location: [facility];  Service: Cardiovascular;  Laterality: N/A;     Current Outpatient Prescriptions  Medication Sig Dispense Refill  . acetaminophen (TYLENOL) 325 MG tablet Take 2 tablets (650 mg total) by mouth every 4 (four) hours as needed for headache or mild pain.    Marland Kitchen apixaban (ELIQUIS) 5 MG TABS tablet Take 5 mg by mouth 2 (two) times daily.    Marland Kitchen atorvastatin (LIPITOR) 80 MG tablet Take 1 tablet (80 mg total) by mouth daily at 6 PM. 30 tablet 6  . isosorbide mononitrate (IMDUR) 30 MG 24 hr tablet Take 1 tablet (30 mg total) by mouth daily. 30 tablet 11  . losartan (COZAAR) 100 MG tablet Take 1 tablet  (100 mg total) by mouth daily. 30 tablet 1  . metoprolol (LOPRESSOR) 100 MG tablet TAKE 1 TABLET BY MOUTH TWICE A DAY 60 tablet 1  . Multiple Vitamin (MULTIVITAMIN WITH MINERALS) TABS tablet Take 1 tablet by mouth daily.    . niacin 100 MG tablet Take 100 mg by mouth 2 (two) times daily with a meal.    . nitroGLYCERIN (NITROSTAT) 0.4 MG SL tablet Place 1 tablet (0.4 mg total) under the tongue every 5 (five) minutes as needed for chest pain. 25 tablet 3  . Omega-3 Fatty Acids (FISH OIL) 1000 MG CPDR Take 4,000 mg by mouth daily.     Marland Kitchen spironolactone (ALDACTONE) 25 MG tablet Take 1 tablet (25 mg total) by mouth daily. 30 tablet 6   No current facility-administered medications for this visit.     Allergies:   Contrast media [iodinated diagnostic agents]; Gluten meal; Tizanidine; Whey; and Morphine and related    Social History:  The patient  reports that he has never smoked. He has never used smokeless tobacco. He reports that he drinks alcohol. He reports that he does not use drugs.   Family History:  The patient's family history includes Diabetes in his sister; Drug abuse in his father; Emphysema in his mother; Heart attack in his father, maternal grandfather, and paternal grandfather; Lung cancer in his mother; Stroke in his mother.    ROS:  General:no colds or fevers, no weight changes Skin:no rashes or ulcers HEENT:no blurred vision, no congestion CV:see HPI PUL:see HPI GI:no diarrhea constipation or melena, no indigestion GU:no hematuria, no dysuria MS:no joint pain, no claudication Neuro:no syncope, no lightheadedness Endo:no diabetes, no thyroid disease  Wt Readings from Last 3 Encounters:  11/20/16 231 lb (104.8 kg)  11/14/16 241 lb (109.3 kg)  11/06/16 244 lb 12.8 oz (111 kg)     PHYSICAL EXAM: VS:  BP 122/80   Pulse 61   Ht 5\' 11"  (1.803 m)   Wt 231 lb (104.8 kg)   BMI 32.22 kg/m  , BMI Body mass index is 32.22 kg/m. General:Pleasant affect, NAD Skin:Warm and dry,  brisk capillary refill HEENT:normocephalic, sclera clear, mucus membranes moist Neck:supple, no JVD, no bruits  Heart:S1S2 RRR without murmur, gallup, rub or click Lungs:clear without rales, rhonchi, or wheezes [id number], non tender, + BS, do not palpate liver spleen or masses Ext:no lower ext edema, 2+ pedal pulses, 2+ radial pulses Neuro:alert and oriented, MAE, follows commands, + facial symmetry    EKG:  EKG is ordered today. The ekg ordered today demonstrates SR with PACs and no acute changes otherwise.    Recent Labs: 11/10/2016: TSH 1.090 11/11/2016: ALT 27 11/17/2016: BUN 19; Creatinine, Ser 1.03; Hemoglobin 15.2; Platelets 176; Potassium 4.2; Sodium 140  Lipid Panel    Component Value Date/Time   CHOL 94 (L) 08/15/2015 0912   TRIG 74 08/15/2015 0912   HDL 37 (L) 08/15/2015 0912   CHOLHDL 2.5 08/15/2015 0912   VLDL 15 08/15/2015 0912   LDLCALC 42 08/15/2015 0912       Other studies Reviewed: Additional studies/ records that were reviewed today include: .  Echo: Study Conclusions  - Left ventricle: The cavity size was normal. There was severe   concentric hypertrophy. Systolic function was normal. The   estimated ejection fraction was in the range of 60% to 65%. Wall   motion was normal; there were no regional wall motion   abnormalities. The study is not technically sufficient to allow   evaluation of LV diastolic function. - Aortic valve: Trileaflet; mildly calcified leaflets. There was   mild regurgitation. - Mitral valve: Mildly thickened leaflets . There was trivial   regurgitation. - Left atrium: Moderately dilated. - Right ventricle: The cavity size was mildly dilated. - Right atrium: Severely dilated. - Tricuspid valve: There was trivial regurgitation. - Pulmonary arteries: PA peak pressure: 20 mm Hg (S). - Inferior vena cava: The vessel was normal in size. The   respirophasic diameter changes were in the normal range (= 50%),   consistent with  normal central venous pressure.  Impressions:  - Compared to a prior study in 06/2015, the LVEF has improved to   60-65%. There is moderate LAE and severe RAE.  Heart Cath: Left Heart Cath and Coronary Angiography  Conclusion     Ost RPDA to RPDA lesion, 0 %stenosed.  Prox Cx to Mid Cx lesion, 0 %stenosed.  Prox LAD to Mid LAD lesion, 0 %stenosed.  Ost 1st Diag lesion, 80 %stenosed.  Mid RCA lesion, 60 %stenosed.     ASSESSMENT AND PLAN:  1.  Chest pain - EKG stable, adding Imdur 30 mg daily. He will call with further problems.  F/u with Dr. Radford Pax in 2 months.    2.  Persistent a fib.  Now post DCCV TEE maintaining SR with PACs.  On anticoagulation with Eliquis and no bleeding.  3. HLD on lipitor   4. CAD with recent cath.  + disease in small vessel.       Current medicines are reviewed with the patient today.  The patient Has no concerns regarding medicines.  The following changes have been made:  See above Labs/ tests ordered today include:see above  Disposition:   FU:  see above  Signed, Cecilie Kicks, NP  11/20/2016 12:07 PM    [address], [place], [place], [place] Phone: [phone]; Fax: [phone]

## 2016-11-19 NOTE — Telephone Encounter (Signed)
Patient called complaining of mild chest discomfort 3/10 that decreases with rest since 9 am this morning. Patient denies any other symptoms such as SOB, weakness, lightheadedness, arm or jaw pain, or sweating. Patient states that he had a cardioversion on 11/17/16. Patient states that he has not taken any medications (tylenol or NTG) for the discomfort that he did not feel like he needed it at this time. Patient did not feel like his pain was urgent but wanted to make our office aware. Patient states that he is taking his eliquis as directed and is taking his other prescribed medications, and has stopped taking the diltiazem, oxymetazoline, and warfarin as directed with hospital discharge. Patient states that he is not home at this time and has not taken his BP. Patient advised to take tylenol as needed for mild pain and to take NTG as needed if he develops worsening chest pain as well. Patient advised to take his BP at rest when he gets home and to notify us if it is out of range. Patient advised to notify us if he develops other symptoms or if his chest pain worsens. Patient advised to take NTG for severe chest pain and to call EMS if he takes 3 doses (5 minutes apart) at one time without relief. Patient verbalized understanding and was in agreement with this plan.

## 2016-11-20 ENCOUNTER — Ambulatory Visit (INDEPENDENT_AMBULATORY_CARE_PROVIDER_SITE_OTHER): Payer: Medicare Other | Admitting: Cardiology

## 2016-11-20 ENCOUNTER — Encounter: Payer: Self-pay | Admitting: Cardiology

## 2016-11-20 VITALS — BP 122/80 | HR 61 | Ht 71.0 in | Wt 231.0 lb

## 2016-11-20 DIAGNOSIS — Z9861 Coronary angioplasty status: Secondary | ICD-10-CM

## 2016-11-20 DIAGNOSIS — I200 Unstable angina: Secondary | ICD-10-CM

## 2016-11-20 DIAGNOSIS — I481 Persistent atrial fibrillation: Secondary | ICD-10-CM | POA: Diagnosis not present

## 2016-11-20 DIAGNOSIS — I4819 Other persistent atrial fibrillation: Secondary | ICD-10-CM

## 2016-11-20 DIAGNOSIS — I4891 Unspecified atrial fibrillation: Secondary | ICD-10-CM | POA: Diagnosis not present

## 2016-11-20 DIAGNOSIS — I2510 Atherosclerotic heart disease of native coronary artery without angina pectoris: Secondary | ICD-10-CM

## 2016-11-20 DIAGNOSIS — E785 Hyperlipidemia, unspecified: Secondary | ICD-10-CM | POA: Diagnosis not present

## 2016-11-20 NOTE — Patient Instructions (Addendum)
Medication Instructions:  Your physician recommends that you continue on your current medications as directed. Please refer to the Current Medication list given to you today.   Labwork: Bmet today  Testing/Procedures: None ordered  Follow-Up: Your physician recommends that you schedule a follow-up appointment in: 2 months with Dr.Turner   Any Other Special Instructions Will Be Listed Below (If Applicable).     If you need a refill on your cardiac medications before your next appointment, please call your pharmacy.

## 2016-11-20 NOTE — Telephone Encounter (Signed)
Rx refill sent to pharmacy. 

## 2016-11-21 LAB — BASIC METABOLIC PANEL
BUN / CREAT RATIO: 24 (ref 10–24)
BUN: 28 mg/dL — ABNORMAL HIGH (ref 8–27)
CHLORIDE: 99 mmol/L (ref 96–106)
CO2: 23 mmol/L (ref 18–29)
Calcium: 9.9 mg/dL (ref 8.6–10.2)
Creatinine, Ser: 1.18 mg/dL (ref 0.76–1.27)
GFR calc non Af Amer: 63 mL/min/{1.73_m2} (ref >59)
GFR, EST AFRICAN AMERICAN: 73 mL/min/{1.73_m2} (ref >59)
Glucose: 85 mg/dL (ref 65–99)
POTASSIUM: 4.8 mmol/L (ref 3.5–5.2)
Sodium: 141 mmol/L (ref 134–144)

## 2016-11-25 ENCOUNTER — Other Ambulatory Visit: Payer: Self-pay

## 2016-11-25 ENCOUNTER — Encounter (HOSPITAL_COMMUNITY): Payer: Self-pay | Admitting: Nurse Practitioner

## 2016-11-25 ENCOUNTER — Ambulatory Visit (INDEPENDENT_AMBULATORY_CARE_PROVIDER_SITE_OTHER): Payer: Medicare Other | Admitting: Physician Assistant

## 2016-11-25 ENCOUNTER — Encounter: Payer: Self-pay | Admitting: Physician Assistant

## 2016-11-25 ENCOUNTER — Ambulatory Visit (HOSPITAL_COMMUNITY)
Admission: RE | Admit: 2016-11-25 | Discharge: 2016-11-25 | Disposition: A | Discharge status: Home / Self Care | Payer: Medicare Other | Source: Ambulatory Visit | Attending: Nurse Practitioner | Admitting: Nurse Practitioner

## 2016-11-25 VITALS — BP 128/84 | HR 60 | Ht 71.0 in | Wt 234.6 lb

## 2016-11-25 VITALS — BP 122/78 | HR 129 | Ht 71.0 in | Wt 235.4 lb

## 2016-11-25 DIAGNOSIS — Z823 Family history of stroke: Secondary | ICD-10-CM | POA: Diagnosis not present

## 2016-11-25 DIAGNOSIS — Z91041 Radiographic dye allergy status: Secondary | ICD-10-CM | POA: Insufficient documentation

## 2016-11-25 DIAGNOSIS — I200 Unstable angina: Secondary | ICD-10-CM

## 2016-11-25 DIAGNOSIS — I2510 Atherosclerotic heart disease of native coronary artery without angina pectoris: Secondary | ICD-10-CM | POA: Diagnosis not present

## 2016-11-25 DIAGNOSIS — Z885 Allergy status to narcotic agent status: Secondary | ICD-10-CM | POA: Insufficient documentation

## 2016-11-25 DIAGNOSIS — I481 Persistent atrial fibrillation: Secondary | ICD-10-CM

## 2016-11-25 DIAGNOSIS — Z801 Family history of malignant neoplasm of trachea, bronchus and lung: Secondary | ICD-10-CM | POA: Insufficient documentation

## 2016-11-25 DIAGNOSIS — Z833 Family history of diabetes mellitus: Secondary | ICD-10-CM | POA: Diagnosis not present

## 2016-11-25 DIAGNOSIS — I4819 Other persistent atrial fibrillation: Secondary | ICD-10-CM

## 2016-11-25 DIAGNOSIS — I10 Essential (primary) hypertension: Secondary | ICD-10-CM | POA: Diagnosis not present

## 2016-11-25 DIAGNOSIS — I340 Nonrheumatic mitral (valve) insufficiency: Secondary | ICD-10-CM | POA: Diagnosis not present

## 2016-11-25 DIAGNOSIS — Z951 Presence of aortocoronary bypass graft: Secondary | ICD-10-CM | POA: Insufficient documentation

## 2016-11-25 DIAGNOSIS — Z7901 Long term (current) use of anticoagulants: Secondary | ICD-10-CM | POA: Diagnosis not present

## 2016-11-25 DIAGNOSIS — G4733 Obstructive sleep apnea (adult) (pediatric): Secondary | ICD-10-CM | POA: Insufficient documentation

## 2016-11-25 DIAGNOSIS — I480 Paroxysmal atrial fibrillation: Secondary | ICD-10-CM | POA: Diagnosis not present

## 2016-11-25 DIAGNOSIS — Z888 Allergy status to other drugs, medicaments and biological substances status: Secondary | ICD-10-CM | POA: Insufficient documentation

## 2016-11-25 DIAGNOSIS — Z8249 Family history of ischemic heart disease and other diseases of the circulatory system: Secondary | ICD-10-CM | POA: Insufficient documentation

## 2016-11-25 DIAGNOSIS — Z836 Family history of other diseases of the respiratory system: Secondary | ICD-10-CM | POA: Insufficient documentation

## 2016-11-25 DIAGNOSIS — E785 Hyperlipidemia, unspecified: Secondary | ICD-10-CM | POA: Diagnosis not present

## 2016-11-25 DIAGNOSIS — Z91018 Allergy to other foods: Secondary | ICD-10-CM | POA: Diagnosis not present

## 2016-11-25 MED ORDER — APIXABAN 5 MG PO TABS: 5.0000 mg | ORAL_TABLET | Freq: Two times a day (BID) | ORAL | 3 refills | Status: DC

## 2016-11-25 NOTE — Progress Notes (Signed)
Cardiology Office Note   Date:  11/25/2016   ID:  Troy [name], DOB October 16, 1951, MRN [id number]  PCP:  Velna Hatchet, MD  Cardiologist:  Dr Arnold Long 11/20/2016  Rosaria Ferries, PA-C   Chief Complaint  Patient presents with  . Hospitalization Follow-up    dizzy, discuss meds    History of Present Illness: Troy [name] is a 66 y.o. male with a history of 06/2015 DES mLAD &RPDA w/ VF arrest, PCI in [place] 2010, HTN, HLD, MR, PAF, pulm nodule stable 07/2016.  Office visit 11/06/16, noted in atrial flutter and started in cardizem CD 120 but did not take due to cost. Admit 01/01-01/06/2017 for chest pain, s/p TEE/DCCV on Eliquis, MV abnl>>cath w/ small D1 80%, rx medically 11/20/2016 office visit with chest pain, Imdur added  Troy [name] presents for follow up  He is concerned about the Imdur. However, he had significant dizziness and stopped the med. However, he has still had some orthostatic dizziness. He was able to go to an outdoor fair, but would get dizzy after being on his feet for a while. The dizziness has persisted. He has orthostatic symptoms all the time with position change, but is fine sitting still.  He is not having any blood loss of which he is aware. He does not have palpitations or any awareness of an irregular HR.   On 01/11, he was in [place] at the office visit. Today he is in atrial fib. He feels his dizziness started with the Imdur but did not resolve. His HR on his sheet was < 100, but more variable than previous.   He has been exercising and doing his usual activities. He has not felt limited. No LE edema, no orthopnea or PND.    Past Medical History:  Diagnosis Date  . CAD (coronary artery disease)    a. s/p PCI in 2010 in [place]. b. Unstable angina/LHC A999333 complicated by V fib arrest after contrast injection. Cath 06/19/2015 s/p DES to mid LAD and RPDA.  Marland Kitchen Dyslipidemia   . Essential hypertension   . Lung nodule < 6cm on CT 06/16/15    Right lung  . Mitral regurgitation    a. Mild-mod by echo 06/2015.  Marland Kitchen PAF (paroxysmal atrial fibrillation) ([place])    a. 06/2015 noted to be in new a-fib when arrived with unstable angina. Converted to NSR after being shocked in the cath lab for vfib;  b. 06/2015 Eliquis initiated as PAF noted on event monitor.  . Pulmonary nodule 07/2015   a. 1.5 x 1.2 cm smoothly marginated nodule in the central aspect of the right lower lobe with recommendation correlation with nonemergent PET-CT to exclude a neoplasm , stable by CT 07/2016  . Sleep apnea, obstructive   . Ventricular fibrillation ([place])    a. occured during cath 06/18/2015.    Past Surgical History:  Procedure Laterality Date  . CARDIAC CATHETERIZATION N/A 06/18/2015   Procedure: Left Heart Cath and Coronary Angiography;  Surgeon: Burnell Blanks, MD;  Location: [facility];  Service: Cardiovascular;  Laterality: N/A;  . CARDIAC CATHETERIZATION N/A 06/19/2015   Procedure: Coronary Stent Intervention;  Surgeon: Peter M Martinique, MD;  Location: [facility];  Service: Cardiovascular;  Laterality: N/A;  . CARDIAC CATHETERIZATION N/A 11/13/2016   Procedure: Left Heart Cath and Coronary Angiography;  Surgeon: Lorretta Harp, MD;  Location: [facility];  Service: Cardiovascular;  Laterality: N/A;  . CARDIOVERSION N/A 11/17/2016   Procedure:  CARDIOVERSION;  Surgeon: Larey Dresser, MD;  Location: [facility];  Service: Cardiovascular;  Laterality: N/A;  . CORONARY ANGIOPLASTY WITH STENT PLACEMENT    . REPLACEMENT TOTAL KNEE Left   . TEE WITHOUT CARDIOVERSION N/A 11/17/2016   Procedure: TRANSESOPHAGEAL ECHOCARDIOGRAM (TEE);  Surgeon: Larey Dresser, MD;  Location: [facility] ENDOSCOPY;  Service: Cardiovascular;  Laterality: N/A;    Current Outpatient Prescriptions  Medication Sig Dispense Refill  . acetaminophen (TYLENOL) 325 MG tablet Take 2 tablets (650 mg total) by mouth every 4 (four) hours as needed for headache or mild pain.      Marland Kitchen apixaban (ELIQUIS) 5 MG TABS tablet Take 5 mg by mouth 2 (two) times daily.    Marland Kitchen atorvastatin (LIPITOR) 80 MG tablet Take 1 tablet (80 mg total) by mouth daily at 6 PM. 30 tablet 6  . losartan (COZAAR) 100 MG tablet TAKE 1 TABLET (100 MG TOTAL) BY MOUTH DAILY. 30 tablet 0  . metoprolol (LOPRESSOR) 100 MG tablet TAKE 1 TABLET BY MOUTH TWICE A DAY 60 tablet 1  . Multiple Vitamin (MULTIVITAMIN WITH MINERALS) TABS tablet Take 1 tablet by mouth daily.    . niacin 100 MG tablet Take 100 mg by mouth 2 (two) times daily with a meal.    . nitroGLYCERIN (NITROSTAT) 0.4 MG SL tablet Place 1 tablet (0.4 mg total) under the tongue every 5 (five) minutes as needed for chest pain. 25 tablet 3  . Omega-3 Fatty Acids (FISH OIL) 1000 MG CPDR Take 4,000 mg by mouth daily.     Marland Kitchen spironolactone (ALDACTONE) 25 MG tablet Take 1 tablet (25 mg total) by mouth daily. 30 tablet 6   No current facility-administered medications for this visit.     Allergies:   Contrast media [iodinated diagnostic agents]; Gluten meal; Tizanidine; Whey; and Morphine and related    Social History:  The patient  reports that he has never smoked. He has never used smokeless tobacco. He reports that he drinks alcohol. He reports that he does not use drugs.   Family History:  The patient's family history includes Diabetes in his sister; Drug abuse in his father; Emphysema in his mother; Heart attack in his father, maternal grandfather, and paternal grandfather; Lung cancer in his mother; Stroke in his mother.    ROS:  Please see the history of present illness. All other systems are reviewed and negative.    PHYSICAL EXAM: VS:  BP 128/84   Pulse 60   Ht 5\' 11"  (1.803 m)   Wt 234 lb 9.6 oz (106.4 kg)   BMI 32.72 kg/m  , BMI Body mass index is 32.72 kg/m. GEN: Well nourished, well developed, male in no acute distress  HEENT: normal for age  Neck: no JVD, no carotid bruit, no masses Cardiac: Irreg R&R; no murmur, no rubs, or  gallops Respiratory:  clear to auscultation bilaterally, normal work of breathing GI: soft, nontender, nondistended, + BS MS: no deformity or atrophy; no edema; distal pulses are 2+ in all 4 extremities   Skin: warm and dry, no rash Neuro:  Strength and sensation are intact Psych: euthymic mood, full affect   EKG:  EKG is ordered today. The ekg ordered today demonstrates Atrial fib, HR 98, no acute ischemic changes.   Recent Labs: 11/10/2016: TSH 1.090 11/11/2016: ALT 27 11/17/2016: Hemoglobin 15.2; Platelets 176 11/20/2016: BUN 28; Creatinine, Ser 1.18; Potassium 4.8; Sodium 141    Lipid Panel    Component Value Date/Time   CHOL 94 (L)  08/15/2015 0912   TRIG 74 08/15/2015 0912   HDL 37 (L) 08/15/2015 0912   CHOLHDL 2.5 08/15/2015 0912   VLDL 15 08/15/2015 0912   LDLCALC 42 08/15/2015 0912     Wt Readings from Last 3 Encounters:  11/25/16 234 lb 9.6 oz (106.4 kg)  11/20/16 231 lb (104.8 kg)  11/14/16 241 lb (109.3 kg)     Other studies Reviewed: Additional studies/ records that were reviewed today include: office notes, hospital records and testing.  ASSESSMENT AND PLAN:  1.  Persisent atrial fib: Pt is on max dose of metoprolol. S/p TEE/DCCV but had recurrence of atrial fib. His dizziness is probably related to that. I feel he would benefit from maintaining SR. He failed DCCV and I cannot increase his BB any more. He needs consideration of anti-arrhythmic therapy. We contacted the afib clinic and they can see him today.   2. Chronic anticoagulation: He is compliant with the Eliquis  3. CAD: He is not having any ongoing ischemic symptoms. He is doing well from a cardiac standpoint.  Current medicines are reviewed at length with the patient today.  The patient does not have concerns regarding medicines.  The following changes have been made:  no change  Labs/ tests ordered today include:  No orders of the defined types were placed in this encounter.    Disposition:    FU with A. fib clinic, Dr. Radford Pax and myself  Signed, Lenoard Aden  11/25/2016 12:23 PM    [place] Phone: [phone]; Fax: [phone]  This note was written with the assistance of speech recognition software. Please excuse any transcriptional errors.

## 2016-11-25 NOTE — Patient Instructions (Addendum)
Your physician recommends that you continue on your current medications as directed. Please refer to the Current Medication list given to you today.  Your physician recommends that you schedule a follow-up appointment in: ONE YEAR with Dr. Mallie Snooks will have an appointment with Roderic Palau, NP today @ [facility] at [facility] @ 2:30pm -- from [facility], turn LEFT onto [address] -- turn RIGHT @ 3rd entrance (there will be Architect) -- go to parking lot under building and enter code [phone] to get in to lot -- take elevator to 1st floor

## 2016-11-27 ENCOUNTER — Ambulatory Visit: Payer: Medicare Other | Admitting: Cardiology

## 2016-11-27 NOTE — Progress Notes (Signed)
Primary Care Physician: Velna Hatchet, MD Referring Physician:Rhonda Barrett, PA   Troy [name] is a 66 y.o. male with a h/o 06/2015 DES mLAD &RPDA w/ VF arrest, PCI in [place] 2010, HTN, HLD, MR, PAF, pulm nodule stable 07/2016 in the afib clinic for evaluation. He was seen for new onset atrial flutter at [facility] 11/06/16 and was prescribed Cardizem and eliquis but did not take due to cost. Pt did not sign up for Medicare D at time of turning 63. He was admitted 1/1 to 1/8 for chest pain, and continued in afib with rvr. He was started on eliquis and had TEE guided cardioversion which was successful. He was seen f/u by Suanne Marker this am and was found to be in afib rvr and was sent to afib clinic to discuss antiarrythmic. Pt thinks he may have had paroxysmal afib for some time and did not appreciate what was going on. His left atrial size is 50 mm and supports this theory and may undermine ability to maintain SR long term. Initially HR was 126 on presentation but at home he is getting v rates in the 80's and after a few minutes of quiet sitting pulse ox showed v rates in the 80's. He feels ok he says. He was on Imdur which was stopped due to dizziness which is good because he takes viagra on a regular basis(not on med list) and was warned not to take NTG within 24 hours of viagra.   He does not smoke or consume significant amounts of  Alcohol or caffeine. He is active and is not overweight. He does have OSA but has not been using his CPAP. Renal function is normal to work with antiarrythmic's and qtc at baseline in SR is 428 ms. No qtc prolonging drugs on board at baseline. He has been taking eliquis since 1/2 and has not been on DOAC's for 3 weeks yet. States no missed doses. He felt much better after cardioversion that he does now in afib.   Today, he denies symptoms of palpitations, chest pain, shortness of breath, orthopnea, PND, lower extremity edema, dizziness, presyncope, syncope, or neurologic  sequela. The patient is tolerating medications without difficulties and is otherwise without complaint today.   Past Medical History:  Diagnosis Date  . CAD (coronary artery disease)    a. s/p PCI in 2010 in [place]. b. Unstable angina/LHC A999333 complicated by V fib arrest after contrast injection. Cath 06/19/2015 s/p DES to mid LAD and RPDA.  Marland Kitchen Dyslipidemia   . Essential hypertension   . Lung nodule < 6cm on CT 06/16/15   Right lung  . Mitral regurgitation    a. Mild-mod by echo 06/2015.  Marland Kitchen PAF (paroxysmal atrial fibrillation) ([place])    a. 06/2015 noted to be in new a-fib when arrived with unstable angina. Converted to NSR after being shocked in the cath lab for vfib;  b. 06/2015 Eliquis initiated as PAF noted on event monitor.  . Pulmonary nodule 07/2015   a. 1.5 x 1.2 cm smoothly marginated nodule in the central aspect of the right lower lobe with recommendation correlation with nonemergent PET-CT to exclude a neoplasm , stable by CT 07/2016  . Sleep apnea, obstructive   . Ventricular fibrillation ([place])    a. occured during cath 06/18/2015.   Past Surgical History:  Procedure Laterality Date  . CARDIAC CATHETERIZATION N/A 06/18/2015   Procedure: Left Heart Cath and Coronary Angiography;  Surgeon: Burnell Blanks, MD;  Location: [place] [facility];  Service: Cardiovascular;  Laterality: N/A;  . CARDIAC CATHETERIZATION N/A 06/19/2015   Procedure: Coronary Stent Intervention;  Surgeon: Peter M Martinique, MD;  Location: [place] [facility];  Service: Cardiovascular;  Laterality: N/A;  . CARDIAC CATHETERIZATION N/A 11/13/2016   Procedure: Left Heart Cath and Coronary Angiography;  Surgeon: Lorretta Harp, MD;  Location: [place] [facility];  Service: Cardiovascular;  Laterality: N/A;  . CARDIOVERSION N/A 11/17/2016   Procedure: CARDIOVERSION;  Surgeon: Larey Dresser, MD;  Location: [place];  Service: Cardiovascular;  Laterality: N/A;  . CORONARY ANGIOPLASTY WITH STENT PLACEMENT    .  REPLACEMENT TOTAL KNEE Left   . TEE WITHOUT CARDIOVERSION N/A 11/17/2016   Procedure: TRANSESOPHAGEAL ECHOCARDIOGRAM (TEE);  Surgeon: Larey Dresser, MD;  Location: [facility];  Service: Cardiovascular;  Laterality: N/A;    Current Outpatient Prescriptions  Medication Sig Dispense Refill  . apixaban (ELIQUIS) 5 MG TABS tablet Take 1 tablet (5 mg total) by mouth 2 (two) times daily. 180 tablet 3  . atorvastatin (LIPITOR) 80 MG tablet Take 1 tablet (80 mg total) by mouth daily at 6 PM. 30 tablet 6  . losartan (COZAAR) 100 MG tablet TAKE 1 TABLET (100 MG TOTAL) BY MOUTH DAILY. 30 tablet 0  . metoprolol (LOPRESSOR) 100 MG tablet TAKE 1 TABLET BY MOUTH TWICE A DAY 60 tablet 1  . Multiple Vitamin (MULTIVITAMIN WITH MINERALS) TABS tablet Take 1 tablet by mouth daily.    . niacin 100 MG tablet Take 100 mg by mouth 2 (two) times daily with a meal.    . Omega-3 Fatty Acids (FISH OIL) 1000 MG CPDR Take 4,000 mg by mouth daily.     Marland Kitchen spironolactone (ALDACTONE) 25 MG tablet Take 1 tablet (25 mg total) by mouth daily. 30 tablet 6  . acetaminophen (TYLENOL) 325 MG tablet Take 2 tablets (650 mg total) by mouth every 4 (four) hours as needed for headache or mild pain. (Patient not taking: Reported on 11/25/2016)     No current facility-administered medications for this encounter.     Allergies  Allergen Reactions  . Contrast Media [Iodinated Diagnostic Agents] Other (See Comments)    V-Fib arrest during cardiac cath suspected d/t contrast dye in 2016  . Gluten Meal Other (See Comments)    REACTION: Celiac disease  . Tizanidine Anxiety    Depression  . Whey Other (See Comments)    REACTION: Celiac disease  . Morphine And Related Other (See Comments)    REACTION: "REALLY BAD STOMACH PAIN"  . Imdur [Isosorbide Dinitrate]     Low blood pressure    Social History   Social History  . Marital status: Married    Spouse name: N/A  . Number of children: N/A  . Years of education: N/A   Occupational  History  . Not on file.   Social History Main Topics  . Smoking status: Never Smoker  . Smokeless tobacco: Never Used     Comment: Second-hand exposure through parents  . Alcohol use 0.0 oz/week     Comment: Social EtOH  . Drug use: No  . Sexual activity: Not on file   Other Topics Concern  . Not on file   Social History Narrative   Originally he is from [place], [place]. He moved to [facility] in 2013. He has a dog & cat at home. No bird or mold exposure. Has a hot tub but hasn't used it in 4 months. 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     Family  History  Problem Relation Age of Onset  . Stroke Mother   . Emphysema Mother   . Lung cancer Mother   . Heart attack Father   . Drug abuse Father   . Heart attack Maternal Grandfather   . Heart attack Paternal Grandfather   . Diabetes Sister     ROS- All systems are reviewed and negative except as per the HPI above  Physical Exam: Vitals:   11/25/16 1454  BP: 122/78  Pulse: (!) 129  Weight: 235 lb 6.4 oz (106.8 kg)  Height: 5\' 11"  (1.803 m)   Wt Readings from Last 3 Encounters:  11/25/16 235 lb 6.4 oz (106.8 kg)  11/25/16 234 lb 9.6 oz (106.4 kg)  11/20/16 231 lb (104.8 kg)    Labs: Lab Results  Component Value Date   NA 141 11/20/2016   K 4.8 11/20/2016   CL 99 11/20/2016   CO2 23 11/20/2016   GLUCOSE 85 11/20/2016   BUN 28 (H) 11/20/2016   CREATININE 1.18 11/20/2016   CALCIUM 9.9 11/20/2016   Lab Results  Component Value Date   INR 1.13 11/13/2016   Lab Results  Component Value Date   CHOL 94 (L) 08/15/2015   HDL 37 (L) 08/15/2015   LDLCALC 42 08/15/2015   TRIG 74 08/15/2015     GEN- The patient is well appearing, alert and oriented x 3 today.   Head- normocephalic, atraumatic Eyes-  Sclera clear, conjunctiva pink Ears- hearing intact Oropharynx- clear Neck- supple, no JVP Lymph- no cervical lymphadenopathy Lungs- Clear to ausculation bilaterally, normal work of breathing Heart- irregularrate and rhythm,  no murmurs, rubs or gallops, PMI not laterally displaced GI- soft, NT, ND, + BS Extremities- no clubbing, cyanosis, or edema MS- no significant deformity or atrophy Skin- no rash or lesion Psych- euthymic mood, full affect Neuro- strength and sensation are intact  EKG- afib with rvr Echo-- Left ventricle: The cavity size was normal. There was severe   concentric hypertrophy. Systolic function was normal. The   estimated ejection fraction was in the range of 60% to 65%. Wall   motion was normal; there were no regional wall motion   abnormalities. The study is not technically sufficient to allow   evaluation of LV diastolic function. - Aortic valve: Trileaflet; mildly calcified leaflets. There was   mild regurgitation. - Mitral valve: Mildly thickened leaflets . There was trivial   regurgitation. - Left atrium: Moderately dilated. 50 mm - Right ventricle: The cavity size was mildly dilated. - Right atrium: Severely dilated. - Tricuspid valve: There was trivial regurgitation. - Pulmonary arteries: PA peak pressure: 20 mm Hg (S). - Inferior vena cava: The vessel was normal in size. The   respirophasic diameter changes were in the normal range (= 50%),   consistent with normal central venous pressure.  Impressions:  - Compared to a prior study in 06/2015, the LVEF has improved to   60-65%. There is moderate LAE and severe RAE.   Assessment and Plan: 1. Newly diagnosed symptomatic afib Successful cardioversion 1/8 but EFAF Options are limited by CAD(no 1c agents) and cost ( no medicare D) I think multaq would not be a strong enough drug to control his afib , Tikosyn is out due to cost and I would prefer not to use amiodarone 2/2 relative young age.  Therefore, he has been given option of sotalol.  I think by his readings at home he is fairly well rate controlled He will continue xarelto for now(longterm he  may have to use warfarin) for a CHA2DS2VAS of at least 3. 3 weeks  uninterrupted use on 1/24. Encouraged to sign up for Medicare D  2. CAD Stable Warned not to use nitrates with viagra He is now off imdur  3. HTN Stable  4. OSA Educated re sleep apnea and atrial fib Instructed to use cpap, states that he just got of habit  He will return in one week and plans will be put in place for sotalol He wanted to check price of drug out of pocket and will be on uninterrupted eliquis as off 1/23.  Geroge Baseman Stori Royse, [facility] [address] [place],  [address] [phone]

## 2016-11-28 NOTE — Addendum Note (Signed)
Encounter addended by: Sherran Needs, NP on: 11/28/2016  8:32 AM<BR>    Actions taken: LOS modified

## 2016-12-01 ENCOUNTER — Ambulatory Visit: Payer: Medicare Other | Admitting: Physician Assistant

## 2016-12-02 ENCOUNTER — Encounter (HOSPITAL_COMMUNITY): Payer: Self-pay | Admitting: Nurse Practitioner

## 2016-12-02 ENCOUNTER — Ambulatory Visit (HOSPITAL_COMMUNITY)
Admission: RE | Admit: 2016-12-02 | Discharge: 2016-12-02 | Disposition: A | Discharge status: Home / Self Care | Payer: Medicare Other | Source: Ambulatory Visit | Attending: Nurse Practitioner | Admitting: Nurse Practitioner

## 2016-12-02 VITALS — BP 126/84 | HR 107 | Ht 71.0 in | Wt 233.0 lb

## 2016-12-02 DIAGNOSIS — I481 Persistent atrial fibrillation: Secondary | ICD-10-CM | POA: Insufficient documentation

## 2016-12-02 DIAGNOSIS — I4819 Other persistent atrial fibrillation: Secondary | ICD-10-CM

## 2016-12-02 DIAGNOSIS — G4733 Obstructive sleep apnea (adult) (pediatric): Secondary | ICD-10-CM

## 2016-12-02 NOTE — Patient Instructions (Addendum)
You are scheduled for admission for sotalol initiation on Friday, February 23rd -- admissions will call you when bed is available. Go to admitting upon arrival.  Do not miss any doses of your Eliquis

## 2016-12-02 NOTE — Progress Notes (Signed)
Primary Care Physician: Velna Hatchet, MD Primary Cardiologist: Jerrald Ting is a 66 y.o. male with a history of persistent atrial fibrillation who presents for follow up in the [facility].  The patient was initially diagnosed with atrial fibrillation 10/2016.  He was started on Eliquis and was referred to the AF clinic for treatment options. Because of cost concerns, Sotalol was recommended.  He presents today for follow up to discuss Sotalol admission.  [facility] that his shortness of breath is somewhat improved. He still is symptomatic with walking up stairs.  Today, he denies symptoms of palpitations, chest pain, orthopnea, PND, lower extremity edema, dizziness, presyncope, syncope, snoring, daytime somnolence, bleeding, or neurologic sequela. The patient is tolerating medications without difficulties and is otherwise without complaint today.    Atrial Fibrillation Risk Factors:  he does have symptoms or diagnosis of sleep apnea. he is compliant with CPAP therapy.  he does not have a history of rheumatic fever.  he does not have a history of alcohol use.  he has a BMI of Body mass index is 32.5 kg/m.Marland Kitchen Filed Weights   12/02/16 0839  Weight: 233 lb (105.7 kg)    LA size: 50   Atrial Fibrillation Management history:  Previous antiarrhythmic drugs: none  Previous cardioversions: none  Previous ablations: none  CHADS2VASC score: 3  Anticoagulation history: Eliquis   Past Medical History:  Diagnosis Date  . CAD (coronary artery disease)    a. s/p PCI in 2010 in [place]. b. Unstable angina/LHC A999333 complicated by V fib arrest after contrast injection. Cath 06/19/2015 s/p DES to mid LAD and RPDA.  Marland Kitchen Dyslipidemia   . Essential hypertension   . Lung nodule < 6cm on CT 06/16/15   Right lung  . Mitral regurgitation    a. Mild-mod by echo 06/2015.  Marland Kitchen PAF (paroxysmal atrial fibrillation) ([place])    a.  06/2015 noted to be in new a-fib when arrived with unstable angina. Converted to NSR after being shocked in the cath lab for vfib;  b. 06/2015 Eliquis initiated as PAF noted on event monitor.  . Pulmonary nodule 07/2015   a. 1.5 x 1.2 cm smoothly marginated nodule in the central aspect of the right lower lobe with recommendation correlation with nonemergent PET-CT to exclude a neoplasm , stable by CT 07/2016  . Sleep apnea, obstructive   . Ventricular fibrillation ([place])    a. occured during cath 06/18/2015.   Past Surgical History:  Procedure Laterality Date  . CARDIAC CATHETERIZATION N/A 06/18/2015   Procedure: Left Heart Cath and Coronary Angiography;  Surgeon: Burnell Blanks, MD;  Location: [place] [facility];  Service: Cardiovascular;  Laterality: N/A;  . CARDIAC CATHETERIZATION N/A 06/19/2015   Procedure: Coronary Stent Intervention;  Surgeon: Peter M Martinique, MD;  Location: [place] [facility];  Service: Cardiovascular;  Laterality: N/A;  . CARDIAC CATHETERIZATION N/A 11/13/2016   Procedure: Left Heart Cath and Coronary Angiography;  Surgeon: Lorretta Harp, MD;  Location: [place] [facility];  Service: Cardiovascular;  Laterality: N/A;  . CARDIOVERSION N/A 11/17/2016   Procedure: CARDIOVERSION;  Surgeon: Larey Dresser, MD;  Location: [place];  Service: Cardiovascular;  Laterality: N/A;  . CORONARY ANGIOPLASTY WITH STENT PLACEMENT    . REPLACEMENT TOTAL KNEE Left   . TEE WITHOUT CARDIOVERSION N/A 11/17/2016   Procedure: TRANSESOPHAGEAL ECHOCARDIOGRAM (TEE);  Surgeon: Larey Dresser, MD;  Location: [place];  Service:  Cardiovascular;  Laterality: N/A;    Current Outpatient Prescriptions  Medication Sig Dispense Refill  . acetaminophen (TYLENOL) 325 MG tablet Take 2 tablets (650 mg total) by mouth every 4 (four) hours as needed for headache or mild pain.    Marland Kitchen apixaban (ELIQUIS) 5 MG TABS tablet Take 1 tablet (5 mg total) by mouth 2 (two) times daily. 180 tablet 3  . atorvastatin  (LIPITOR) 80 MG tablet Take 1 tablet (80 mg total) by mouth daily at 6 PM. 30 tablet 6  . isosorbide mononitrate (IMDUR) 30 MG 24 hr tablet Take 30 mg by mouth daily.    Marland Kitchen losartan (COZAAR) 100 MG tablet TAKE 1 TABLET (100 MG TOTAL) BY MOUTH DAILY. 30 tablet 0  . metoprolol (LOPRESSOR) 100 MG tablet TAKE 1 TABLET BY MOUTH TWICE A DAY 60 tablet 1  . Multiple Vitamin (MULTIVITAMIN WITH MINERALS) TABS tablet Take 1 tablet by mouth daily.    . niacin 100 MG tablet Take 100 mg by mouth 2 (two) times daily with a meal.    . Omega-3 Fatty Acids (FISH OIL) 1000 MG CPDR Take 4,000 mg by mouth daily.     Marland Kitchen spironolactone (ALDACTONE) 25 MG tablet Take 1 tablet (25 mg total) by mouth daily. 30 tablet 6   No current facility-administered medications for this encounter.     Allergies  Allergen Reactions  . Contrast Media [Iodinated Diagnostic Agents] Other (See Comments)    V-Fib arrest during cardiac cath suspected d/t contrast dye in 2016  . Gluten Meal Other (See Comments)    REACTION: Celiac disease  . Tizanidine Anxiety    Depression  . Whey Other (See Comments)    REACTION: Celiac disease  . Morphine And Related Other (See Comments)    REACTION: "REALLY BAD STOMACH PAIN"  . Imdur [Isosorbide Dinitrate]     Low blood pressure    Social History   Social History  . Marital status: Married    Spouse name: N/A  . Number of children: N/A  . Years of education: N/A   Occupational History  . Not on file.   Social History Main Topics  . Smoking status: Never Smoker  . Smokeless tobacco: Never Used     Comment: Second-hand exposure through parents  . Alcohol use 0.0 oz/week     Comment: Social EtOH  . Drug use: No  . Sexual activity: Not on file   Other Topics Concern  . Not on file   Social History Narrative   Originally he is from [place], [place]. He moved to [facility] in 2013. He has a dog & cat at home. No bird or mold exposure. Has a hot tub but hasn't used it in 4 months. 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     Family History  Problem Relation Age of Onset  . Stroke Mother   . Emphysema Mother   . Lung cancer Mother   . Heart attack Father   . Drug abuse Father   . Heart attack Maternal Grandfather   . Heart attack Paternal Grandfather   . Diabetes Sister    The patient does not have a history of early familial atrial fibrillation or other arrhythmias.  ROS- All systems are reviewed and negative except as per the HPI above.  Physical Exam: Vitals:   12/02/16 0839  BP: 126/84  Pulse: (!) 107  Weight: 233 lb (105.7 kg)  Height: 5\' 11"  (1.803 m)    GEN- The patient is well appearing, alert  and oriented x 3 today.   Head- normocephalic, atraumatic Eyes-  Sclera clear, conjunctiva pink Ears- hearing intact Oropharynx- clear Neck- supple  Lungs- Clear to ausculation bilaterally, normal work of breathing Heart- Regular rate and rhythm, no murmurs, rubs or gallops  GI- soft, NT, ND, + BS Extremities- no clubbing, cyanosis, or edema MS- no significant deformity or atrophy Skin- no rash or lesion Psych- euthymic mood, full affect Neuro- strength and sensation are intact  Wt Readings from Last 3 Encounters:  12/02/16 233 lb (105.7 kg)  11/25/16 235 lb 6.4 oz (106.8 kg)  11/25/16 234 lb 9.6 oz (106.4 kg)    EKG today demonstrates atrial fibrillation, rate 107, QTc 447msec (441msec in sinus rhythm) Echo 11/2016 demonstrated EF 60-65%, severe RAE, LA 50  Epic records are reviewed at length today  Assessment and Plan:  1. Persistent atrial fibrillation The patient has symptomatic persistent atrial fibrillation He reports compliance with Eliquis without missed doses Continue long term for CHADS2VASC of 3 We again discussed Sotalol admission today. He is willing to proceed, but would like to put off for a month. He would like to try CPAP compliance and increased activity to see if he will revert to SR on his own. We discussed the fact that this is unlikely  with his biatrial enlargement, but he is clear in his decision Will schedule sotalol admission for about 4 weeks from now with BMET and Mg day before admission.   2. Obstructive sleep apnea The importance of adequate treatment of sleep apnea was discussed today in order to improve our ability to maintain sinus rhythm long term.  3.  CAD No recent ischemic symptoms Continue medical therapy   Follow up with AF clinic after Sotalol load    Chanetta Marshall, NP 12/02/2016 9:06 AM

## 2016-12-08 DIAGNOSIS — H43811 Vitreous degeneration, right eye: Secondary | ICD-10-CM | POA: Diagnosis not present

## 2016-12-08 DIAGNOSIS — H52203 Unspecified astigmatism, bilateral: Secondary | ICD-10-CM | POA: Diagnosis not present

## 2016-12-08 DIAGNOSIS — H31002 Unspecified chorioretinal scars, left eye: Secondary | ICD-10-CM | POA: Diagnosis not present

## 2016-12-08 DIAGNOSIS — H35372 Puckering of macula, left eye: Secondary | ICD-10-CM | POA: Diagnosis not present

## 2016-12-24 ENCOUNTER — Other Ambulatory Visit (HOSPITAL_COMMUNITY): Payer: Self-pay | Admitting: *Deleted

## 2016-12-24 MED ORDER — METOPROLOL TARTRATE 100 MG PO TABS
100.0000 mg | ORAL_TABLET | Freq: Two times a day (BID) | ORAL | 0 refills | Status: DC
Start: 2016-12-24 — End: 2017-01-04

## 2016-12-24 MED ORDER — APIXABAN 5 MG PO TABS: 5.0000 mg | ORAL_TABLET | Freq: Two times a day (BID) | ORAL | 3 refills | Status: DC

## 2017-01-01 ENCOUNTER — Encounter (HOSPITAL_COMMUNITY): Payer: Self-pay

## 2017-01-01 ENCOUNTER — Ambulatory Visit (HOSPITAL_COMMUNITY)
Admission: RE | Admit: 2017-01-01 | Discharge: 2017-01-01 | Disposition: A | Discharge status: Home / Self Care | Payer: Medicare Other | Source: Ambulatory Visit | Attending: Nurse Practitioner | Admitting: Nurse Practitioner

## 2017-01-01 ENCOUNTER — Ambulatory Visit (HOSPITAL_COMMUNITY): Admission: RE | Admit: 2017-01-01 | Payer: Medicare Other | Source: Ambulatory Visit | Admitting: Nurse Practitioner

## 2017-01-01 DIAGNOSIS — I481 Persistent atrial fibrillation: Secondary | ICD-10-CM | POA: Insufficient documentation

## 2017-01-01 LAB — BASIC METABOLIC PANEL
Anion gap: 10 (ref 5–15)
BUN: 24 mg/dL — ABNORMAL HIGH (ref 6–20)
CO2: 26 mmol/L (ref 22–32)
Calcium: 9.4 mg/dL (ref 8.9–10.3)
Chloride: 104 mmol/L (ref 101–111)
Creatinine, Ser: 1.24 mg/dL (ref 0.61–1.24)
GFR calc Af Amer: >60 mL/min (ref >60)
GFR calc non Af Amer: 58 mL/min — ABNORMAL LOW (ref >60)
Glucose, Bld: 106 mg/dL — ABNORMAL HIGH (ref 65–99)
Potassium: 4.2 mmol/L (ref 3.5–5.1)
Sodium: 140 mmol/L (ref 135–145)

## 2017-01-01 LAB — MAGNESIUM: Magnesium: 2.1 mg/dL (ref 1.7–2.4)

## 2017-01-02 ENCOUNTER — Encounter (HOSPITAL_COMMUNITY): Payer: Self-pay | Admitting: General Practice

## 2017-01-02 ENCOUNTER — Inpatient Hospital Stay (HOSPITAL_COMMUNITY)
Admission: RE | Admit: 2017-01-02 | Discharge: 2017-01-04 | DRG: 310 | Disposition: A | Discharge status: Home / Self Care | Payer: Medicare Other | Source: Ambulatory Visit | Attending: Internal Medicine | Admitting: Internal Medicine

## 2017-01-02 DIAGNOSIS — I25119 Atherosclerotic heart disease of native coronary artery with unspecified angina pectoris: Secondary | ICD-10-CM | POA: Diagnosis not present

## 2017-01-02 DIAGNOSIS — I10 Essential (primary) hypertension: Secondary | ICD-10-CM

## 2017-01-02 DIAGNOSIS — E785 Hyperlipidemia, unspecified: Secondary | ICD-10-CM | POA: Diagnosis not present

## 2017-01-02 DIAGNOSIS — Z888 Allergy status to other drugs, medicaments and biological substances status: Secondary | ICD-10-CM | POA: Diagnosis not present

## 2017-01-02 DIAGNOSIS — G4733 Obstructive sleep apnea (adult) (pediatric): Secondary | ICD-10-CM | POA: Diagnosis present

## 2017-01-02 DIAGNOSIS — I340 Nonrheumatic mitral (valve) insufficiency: Secondary | ICD-10-CM | POA: Diagnosis not present

## 2017-01-02 DIAGNOSIS — Z91041 Radiographic dye allergy status: Secondary | ICD-10-CM | POA: Diagnosis not present

## 2017-01-02 DIAGNOSIS — Z7901 Long term (current) use of anticoagulants: Secondary | ICD-10-CM | POA: Diagnosis not present

## 2017-01-02 DIAGNOSIS — I481 Persistent atrial fibrillation: Principal | ICD-10-CM

## 2017-01-02 DIAGNOSIS — Z885 Allergy status to narcotic agent status: Secondary | ICD-10-CM | POA: Diagnosis not present

## 2017-01-02 DIAGNOSIS — Z955 Presence of coronary angioplasty implant and graft: Secondary | ICD-10-CM | POA: Diagnosis not present

## 2017-01-02 DIAGNOSIS — Z8249 Family history of ischemic heart disease and other diseases of the circulatory system: Secondary | ICD-10-CM

## 2017-01-02 DIAGNOSIS — Z91018 Allergy to other foods: Secondary | ICD-10-CM

## 2017-01-02 DIAGNOSIS — I4891 Unspecified atrial fibrillation: Secondary | ICD-10-CM | POA: Diagnosis present

## 2017-01-02 DIAGNOSIS — Z79899 Other long term (current) drug therapy: Secondary | ICD-10-CM

## 2017-01-02 DIAGNOSIS — R911 Solitary pulmonary nodule: Secondary | ICD-10-CM | POA: Diagnosis not present

## 2017-01-02 HISTORY — DX: Dependence on other enabling machines and devices: Z99.89

## 2017-01-02 HISTORY — DX: Obstructive sleep apnea (adult) (pediatric): G47.33

## 2017-01-02 HISTORY — DX: Unspecified osteoarthritis, unspecified site: M19.90

## 2017-01-02 MED ORDER — ATORVASTATIN CALCIUM 80 MG PO TABS
80.0000 mg | ORAL_TABLET | Freq: Every day | ORAL | Status: DC
  Administered 2017-01-02 – 2017-01-03 (×2): 80 mg via ORAL
  Filled 2017-01-02 (×2): qty 1

## 2017-01-02 MED ORDER — APIXABAN 5 MG PO TABS
5.0000 mg | ORAL_TABLET | Freq: Two times a day (BID) | ORAL | Status: DC
  Administered 2017-01-02 – 2017-01-04 (×4): 5 mg via ORAL
  Filled 2017-01-02 (×5): qty 1

## 2017-01-02 MED ORDER — METOPROLOL TARTRATE 100 MG PO TABS
100.0000 mg | ORAL_TABLET | Freq: Two times a day (BID) | ORAL | Status: DC
  Administered 2017-01-02: 100 mg via ORAL
  Filled 2017-01-02 (×2): qty 1

## 2017-01-02 MED ORDER — ISOSORBIDE MONONITRATE ER 30 MG PO TB24
30.0000 mg | ORAL_TABLET | Freq: Every day | ORAL | Status: DC
  Administered 2017-01-03 – 2017-01-04 (×2): 30 mg via ORAL
  Filled 2017-01-02 (×3): qty 1

## 2017-01-02 MED ORDER — ADULT MULTIVITAMIN W/MINERALS CH
1.0000 | ORAL_TABLET | Freq: Every day | ORAL | Status: DC
  Administered 2017-01-03 – 2017-01-04 (×2): 1 via ORAL
  Filled 2017-01-02 (×3): qty 1

## 2017-01-02 MED ORDER — NIACIN 100 MG PO TABS
100.0000 mg | ORAL_TABLET | Freq: Two times a day (BID) | ORAL | Status: DC
  Administered 2017-01-03 – 2017-01-04 (×2): 100 mg via ORAL
  Filled 2017-01-02 (×4): qty 1

## 2017-01-02 MED ORDER — LOSARTAN POTASSIUM 50 MG PO TABS
100.0000 mg | ORAL_TABLET | Freq: Every day | ORAL | Status: DC
  Administered 2017-01-03 – 2017-01-04 (×2): 100 mg via ORAL
  Filled 2017-01-02 (×3): qty 2

## 2017-01-02 MED ORDER — OMEGA-3-ACID ETHYL ESTERS 1 G PO CAPS
2.0000 g | ORAL_CAPSULE | Freq: Two times a day (BID) | ORAL | Status: DC
  Administered 2017-01-02 – 2017-01-04 (×5): 2 g via ORAL
  Filled 2017-01-02 (×5): qty 2

## 2017-01-02 MED ORDER — ACETAMINOPHEN 325 MG PO TABS: 650.0000 mg | ORAL_TABLET | ORAL | Status: DC | PRN

## 2017-01-02 MED ORDER — NITROGLYCERIN 0.4 MG SL SUBL: 0.4000 mg | SUBLINGUAL_TABLET | SUBLINGUAL | Status: DC | PRN

## 2017-01-02 MED ORDER — SPIRONOLACTONE 25 MG PO TABS
25.0000 mg | ORAL_TABLET | Freq: Every day | ORAL | Status: DC
  Administered 2017-01-03 – 2017-01-04 (×2): 25 mg via ORAL
  Filled 2017-01-02 (×3): qty 1

## 2017-01-02 MED ORDER — SOTALOL HCL 80 MG PO TABS
120.0000 mg | ORAL_TABLET | Freq: Two times a day (BID) | ORAL | Status: DC
  Administered 2017-01-02 – 2017-01-03 (×4): 120 mg via ORAL
  Filled 2017-01-02 (×4): qty 2

## 2017-01-02 NOTE — H&P (Signed)
H&P    Patient ID: Troy [name] MRN: [id number], DOB/AGE: 68-10-1951 68 y.o.  Admit date: 01/02/2017 Date of Consult: 01/02/2017  Primary Physician: Velna Hatchet, MD Primary Cardiologist: Dr. Radford Pax  Reason for Admission: Sotalol initiation  HPI: Troy [name] is a 66 y.o. male h/o 06/2015 DES mLAD &RPDA w/ VF arrest, PCI in [place] 2010, HTN, HLD, MR, PAF, pulm nodule stable 07/2016. He was dx with new onset atrial flutter at [facility] 11/06/16 and was prescribed Cardizem and eliquis but did not take due to cost. He was admitted 1/1 to 11/17/2016 for chest pain, and continued in afib with rvr. He was started on eliquis and had TEE guided cardioversion which was successful, unfortunately with RAF by his f/u visit later that month.  Hw as subsequently seen in the AF clinic and AAD discussed recommending Sotalol (noted felt that multaq would not be a strong enough drug to control his afib , Tikosyn is out due to cost and preferance not to use amiodarone 2/2 relative young age.)   Today he had f/u in the AF clinic, remained in AF and plans for SOtalol admission were made, the patient reported compliance with his Eliquis without missed doses since his discharge 11/17/16.  The patient is feeling well without complaints, denies recent illness, fever, N/V/D.  He again confirms with me he has not missed any doses of his Eliquis for > 4 weeks, and longer.   Past Medical History:  Diagnosis Date  . CAD (coronary artery disease)    a. s/p PCI in 2010 in [place]. b. Unstable angina/LHC [id number] complicated by V fib arrest after contrast injection. Cath 06/19/2015 s/p DES to mid LAD and RPDA.  Marland Kitchen Dyslipidemia   . Essential hypertension   . Lung nodule < 6cm on CT 06/16/15   Right lung  . Mitral regurgitation    a. Mild-mod by echo 06/2015.  Marland Kitchen PAF (paroxysmal atrial fibrillation) ([place])    a. 06/2015 noted to be in new a-fib when arrived with unstable angina. Converted to NSR after being shocked in the cath  lab for vfib;  b. 06/2015 Eliquis initiated as PAF noted on event monitor.  . Pulmonary nodule 07/2015   a. 1.5 x 1.2 cm smoothly marginated nodule in the central aspect of the right lower lobe with recommendation correlation with nonemergent PET-CT to exclude a neoplasm , stable by CT 07/2016  . Sleep apnea, obstructive   . Ventricular fibrillation ([place])    a. occured during cath 06/18/2015.     Surgical History:  Past Surgical History:  Procedure Laterality Date  . CARDIAC CATHETERIZATION N/A 06/18/2015   Procedure: Left Heart Cath and Coronary Angiography;  Surgeon: Burnell Blanks, MD;  Location: [place] [facility];  Service: Cardiovascular;  Laterality: N/A;  . CARDIAC CATHETERIZATION N/A 06/19/2015   Procedure: Coronary Stent Intervention;  Surgeon: Peter M Martinique, MD;  Location: [place] [facility];  Service: Cardiovascular;  Laterality: N/A;  . CARDIAC CATHETERIZATION N/A 11/13/2016   Procedure: Left Heart Cath and Coronary Angiography;  Surgeon: Lorretta Harp, MD;  Location: [place] [facility];  Service: Cardiovascular;  Laterality: N/A;  . CARDIOVERSION N/A 11/17/2016   Procedure: CARDIOVERSION;  Surgeon: Larey Dresser, MD;  Location: [place];  Service: Cardiovascular;  Laterality: N/A;  . CORONARY ANGIOPLASTY WITH STENT PLACEMENT    . REPLACEMENT TOTAL KNEE Left   . TEE WITHOUT CARDIOVERSION N/A 11/17/2016   Procedure: TRANSESOPHAGEAL ECHOCARDIOGRAM (TEE);  Surgeon: Larey Dresser, MD;  [facility];  Service: Cardiovascular;  Laterality: N/A;     Prescriptions Prior to Admission  Medication Sig Dispense Refill Last Dose  . acetaminophen (TYLENOL) 325 MG tablet Take 2 tablets (650 mg total) by mouth every 4 (four) hours as needed for headache or mild pain.   Taking  . apixaban (ELIQUIS) 5 MG TABS tablet Take 1 tablet (5 mg total) by mouth 2 (two) times daily. 60 tablet 3   . atorvastatin (LIPITOR) 80 MG tablet Take 1 tablet (80 mg total) by mouth daily at 6 PM.  30 tablet 6 Taking  . isosorbide mononitrate (IMDUR) 30 MG 24 hr tablet Take 30 mg by mouth daily.   Taking  . losartan (COZAAR) 100 MG tablet TAKE 1 TABLET (100 MG TOTAL) BY MOUTH DAILY. 30 tablet 0 Taking  . metoprolol (LOPRESSOR) 100 MG tablet Take 1 tablet (100 mg total) by mouth 2 (two) times daily. 60 tablet 0   . Multiple Vitamin (MULTIVITAMIN WITH MINERALS) TABS tablet Take 1 tablet by mouth daily.   Taking  . niacin 100 MG tablet Take 100 mg by mouth 2 (two) times daily with a meal.   Taking  . Omega-3 Fatty Acids (FISH OIL) 1000 MG CPDR Take 4,000 mg by mouth daily.    Taking  . spironolactone (ALDACTONE) 25 MG tablet Take 1 tablet (25 mg total) by mouth daily. 30 tablet 6 Taking    Inpatient Medications:   Allergies:  Allergies  Allergen Reactions  . Contrast Media [Iodinated Diagnostic Agents] Other (See Comments)    V-Fib arrest during cardiac cath suspected d/t contrast dye in 2016  . Gluten Meal Other (See Comments)    REACTION: Celiac disease  . Tizanidine Anxiety    Depression  . Whey Other (See Comments)    REACTION: Celiac disease  . Morphine And Related Other (See Comments)    REACTION: "REALLY BAD STOMACH PAIN"  . Imdur [Isosorbide Dinitrate]     Low blood pressure    Social History   Social History  . Marital status: Married    Spouse name: N/A  . Number of children: N/A  . Years of education: N/A   Occupational History  . Not on file.   Social History Main Topics  . Smoking status: Never Smoker  . Smokeless tobacco: Never Used     Comment: Second-hand exposure through parents  . Alcohol use 0.0 oz/week     Comment: Social EtOH  . Drug use: No  . Sexual activity: Not on file   Other Topics Concern  . Not on file   Social History Narrative   Originally he is from [place], [place]. He moved to [facility] in 2013. He has a dog & cat at home. No bird or mold exposure. Has a hot tub but hasn't used it in 4 months. 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       Family History  Problem Relation Age of Onset  . Stroke Mother   . Emphysema Mother   . Lung cancer Mother   . Heart attack Father   . Drug abuse Father   . Heart attack Maternal Grandfather   . Heart attack Paternal Grandfather   . Diabetes Sister      Review of Systems: All other systems reviewed and are otherwise negative except as noted above.  Physical Exam: Vitals:   01/02/17 0953  BP: 122/81  Pulse: (!) 103  Resp: 19  Temp: 97.9 F (36.6 C)  TempSrc: Oral  SpO2: 98%  Weight: 237 lb 11.2 oz (107.8 kg)    GEN- The patient is well appearing, alert and oriented x 3 today.   HEENT: normocephalic, atraumatic; sclera clear, conjunctiva pink; hearing intact; oropharynx clear; neck supple, no JVP Lymph- no cervical lymphadenopathy Lungs- CTA b/l, normal work of breathing.  No wheezes, rales, rhonchi Heart- IRRR,  no murmurs, rubs or gallops, PMI not laterally displaced GI- soft, non-tender, non-distended Extremities- no clubbing, cyanosis, or edema MS- no significant deformity or atrophy Skin- warm and dry, no rash or lesion Psych- euthymic mood, full affect Neuro- no gross deficits observed  Labs:   Lab Results  Component Value Date   WBC 6.1 11/17/2016   HGB 15.2 11/17/2016   HCT 44.7 11/17/2016   MCV 86.1 11/17/2016   PLT 176 11/17/2016    Recent Labs Lab 01/01/17 0845  NA 140  K 4.2  CL 104  CO2 26  BUN 24*  CREATININE 1.24  CALCIUM 9.4  GLUCOSE 106*        EKG: AFib, 107bpm, reviewed with Dr. [name], QTc acce[table for initiation of Sotalol TELEMETRY: AFib, 90's currently 11/18/16: TTE Study Conclusions - Left ventricle: The cavity size was normal. There was severe   concentric hypertrophy. Systolic function was normal. The   estimated ejection fraction was in the range of 60% to 65%. Wall   motion was normal; there were no regional wall motion   abnormalities. The study is not technically sufficient to allow   evaluation of LV diastolic  function. - Aortic valve: Trileaflet; mildly calcified leaflets. There was   mild regurgitation. - Mitral valve: Mildly thickened leaflets . There was trivial   regurgitation. - Left atrium: Moderately dilated. (75mm) - Right ventricle: The cavity size was mildly dilated. - Right atrium: Severely dilated. - Tricuspid valve: There was trivial regurgitation. - Pulmonary arteries: PA peak pressure: 20 mm Hg (S). - Inferior vena cava: The vessel was normal in size. The   respirophasic diameter changes were in the normal range (= 50%),   consistent with normal central venous pressure. Impressions: - Compared to a prior study in 06/2015, the LVEF has improved to   60-65%. There is moderate LAE and severe RAE.    Assessment and Plan:   1.Persistent atrial fibrillation    CHA2DS2Vasc is 3, on Eliquis    Labs from yesterday reviewed, OK to start Sotalol    Daily BMET and mag    Follow EKGs    Creat clearance is 88, no dose adjustment needed    Will plan for DCCV perhaps tomorrow if not in SR and able to arrange  2. CAD     S/p cath last month, no interventions, patent stents     Nitrate listed as allergy, he reports he is taking Imdur daily at home without symptoms  3. OSA     Has CPAP  4. HTN     stable   Signed, Tommye Standard, Hershal Coria 01/02/2017 10:19 AM  EP Attending  Patient seen and examined. Agree with above. The patient has persistent atrial fib and is admitted for initiation of Sotalol therapy. He will undergo DCCV if he does not revert to NSR on his own. Will follow his electrolytes and his QT interval.  Mikle Bosworth.D.

## 2017-01-03 ENCOUNTER — Encounter (HOSPITAL_COMMUNITY): Payer: Self-pay | Admitting: Certified Registered"

## 2017-01-03 ENCOUNTER — Inpatient Hospital Stay (HOSPITAL_COMMUNITY): Payer: Medicare Other | Admitting: Anesthesiology

## 2017-01-03 ENCOUNTER — Encounter (HOSPITAL_COMMUNITY)
Admission: RE | Disposition: A | Discharge status: Home / Self Care | Payer: Self-pay | Source: Ambulatory Visit | Attending: Internal Medicine

## 2017-01-03 DIAGNOSIS — I481 Persistent atrial fibrillation: Secondary | ICD-10-CM

## 2017-01-03 HISTORY — PX: CARDIOVERSION: SHX1299

## 2017-01-03 LAB — BASIC METABOLIC PANEL
ANION GAP: 12 (ref 5–15)
BUN: 26 mg/dL — ABNORMAL HIGH (ref 6–20)
CO2: 24 mmol/L (ref 22–32)
CREATININE: 0.99 mg/dL (ref 0.61–1.24)
Calcium: 9.1 mg/dL (ref 8.9–10.3)
Chloride: 106 mmol/L (ref 101–111)
GFR calc Af Amer: >60 mL/min (ref >60)
GLUCOSE: 93 mg/dL (ref 65–99)
Potassium: 4.2 mmol/L (ref 3.5–5.1)
Sodium: 142 mmol/L (ref 135–145)

## 2017-01-03 LAB — MAGNESIUM: Magnesium: 2.1 mg/dL (ref 1.7–2.4)

## 2017-01-03 SURGERY — CARDIOVERSION
Anesthesia: General

## 2017-01-03 MED ORDER — PROPOFOL 10 MG/ML IV BOLUS
INTRAVENOUS | Status: AC
  Filled 2017-01-03: qty 20

## 2017-01-03 MED ORDER — LIDOCAINE 2% (20 MG/ML) 5 ML SYRINGE
INTRAMUSCULAR | Status: AC
  Filled 2017-01-03: qty 5

## 2017-01-03 MED ORDER — METOPROLOL TARTRATE 50 MG PO TABS
50.0000 mg | ORAL_TABLET | Freq: Two times a day (BID) | ORAL | Status: DC
Start: 2017-01-03 — End: 2017-01-04
  Administered 2017-01-03 – 2017-01-04 (×2): 50 mg via ORAL
  Filled 2017-01-03 (×2): qty 1

## 2017-01-03 MED ORDER — PROPOFOL 10 MG/ML IV BOLUS
INTRAVENOUS | Status: DC | PRN
  Administered 2017-01-03: 60 mg via INTRAVENOUS

## 2017-01-03 NOTE — Anesthesia Postprocedure Evaluation (Addendum)
Anesthesia Post Note  Patient: Troy [name]  Procedure(s) Performed: Procedure(s) (LRB): CARDIOVERSION (N/A)  Patient location during evaluation: PACU Anesthesia Type: General Level of consciousness: awake and alert Pain management: pain level controlled Vital Signs Assessment: post-procedure vital signs reviewed and stable Respiratory status: spontaneous breathing, nonlabored ventilation, respiratory function stable and patient connected to nasal cannula oxygen Cardiovascular status: blood pressure returned to baseline and stable Postop Assessment: no signs of nausea or vomiting Anesthetic complications: no       Last Vitals:  Vitals:   01/03/17 1015 01/03/17 1352  BP: 128/88 111/77  Pulse: 61 (!) 57  Resp: 16 16  Temp: 36.5 C 36.8 C    Last Pain:  Vitals:   01/03/17 1352  TempSrc: Oral                 Clementine Soulliere

## 2017-01-03 NOTE — Procedures (Signed)
Electrical Cardioversion Procedure Note Sunnie Walko [id number] 1950-11-24  Procedure: Electrical Cardioversion Indications:  Atrial Fibrillation  Procedure Details Consent: Risks of procedure as well as the alternatives and risks of each were explained to the (patient/caregiver).  Consent for procedure obtained. Time Out: Verified patient identification, verified procedure, site/side was marked, verified correct patient position, special equipment/implants available, medications/allergies/relevent history reviewed, required imaging and test results available.  Performed  Patient placed on cardiac monitor, pulse oximetry, supplemental oxygen as necessary.  Sedation given: Pt sedated by anesthesia with diprovan 60 mg IV Pacer pads placed anterior and posterior chest.  Cardioverted 1 time(s).  Cardioverted at 120J.  Evaluation Findings: Post procedure EKG shows: NSR Complications: None Patient did tolerate procedure well.   Kirk Ruths 01/03/2017, 9:56 AM

## 2017-01-03 NOTE — Transfer of Care (Signed)
Immediate Anesthesia Transfer of Care Note  Patient: Troy [name]  Procedure(s) Performed: Procedure(s): CARDIOVERSION (N/A)  Patient Location: PACU  Anesthesia Type:General  Level of Consciousness: patient cooperative  Airway & Oxygen Therapy: Patient Spontanous Breathing and Patient connected to nasal cannula oxygen  Post-op Assessment: Report given to RN and Post -op Vital signs reviewed and stable  Post vital signs: Reviewed and stable  Last Vitals:  Vitals:   01/03/17 0956 01/03/17 0959  BP: (!) 163/110 (!) 131/92  Pulse:    Resp:    Temp:      Last Pain:  Vitals:   01/02/17 2112  TempSrc: Oral         Complications: No apparent anesthesia complications

## 2017-01-03 NOTE — Anesthesia Preprocedure Evaluation (Signed)
Anesthesia Evaluation  Patient identified by MRN, date of birth, ID band Patient awake    Reviewed: Allergy & Precautions, NPO status , Patient's Chart, lab work & pertinent test results  History of Anesthesia Complications Negative for: history of anesthetic complications  Airway Mallampati: II  TM Distance: >3 FB Neck ROM: Full    Dental  (+) Teeth Intact   Pulmonary sleep apnea and Continuous Positive Airway Pressure Ventilation ,    breath sounds clear to auscultation       Cardiovascular hypertension, Pt. on medications and Pt. on home beta blockers + angina + CAD and + Cardiac Stents  + dysrhythmias Atrial Fibrillation  Rhythm:Irregular     Neuro/Psych negative neurological ROS  negative psych ROS   GI/Hepatic negative GI ROS, Neg liver ROS,   Endo/Other  negative endocrine ROS  Renal/GU negative Renal ROS     Musculoskeletal  (+) Arthritis ,   Abdominal   Peds  Hematology negative hematology ROS (+)   Anesthesia Other Findings   Reproductive/Obstetrics                             Anesthesia Physical Anesthesia Plan  ASA: II  Anesthesia Plan: General   Post-op Pain Management:    Induction: Intravenous  Airway Management Planned: Mask  Additional Equipment: None  Intra-op Plan:   Post-operative Plan:   Informed Consent: I have reviewed the patients History and Physical, chart, labs and discussed the procedure including the risks, benefits and alternatives for the proposed anesthesia with the patient or authorized representative who has indicated his/her understanding and acceptance.   Dental advisory given  Plan Discussed with: CRNA and Surgeon  Anesthesia Plan Comments:         Anesthesia Quick Evaluation

## 2017-01-03 NOTE — Progress Notes (Signed)
   Progress Note  Patient Name: Troy [name] Date of Encounter: 01/03/2017  Primary Cardiologist: Dr. [name]  Subjective  No chest pain or sob.   Inpatient Medications    Scheduled Meds: . apixaban  5 mg Oral BID  . atorvastatin  80 mg Oral q1800  . isosorbide mononitrate  30 mg Oral Daily  . losartan  100 mg Oral Daily  . metoprolol  100 mg Oral BID  . multivitamin with minerals  1 tablet Oral Daily  . niacin  100 mg Oral BID WC  . omega-3 acid ethyl esters  2 g Oral BID  . sotalol  120 mg Oral Q12H  . spironolactone  25 mg Oral Daily   Continuous Infusions:  PRN Meds: acetaminophen, nitroGLYCERIN   Vital Signs    Vitals:   01/02/17 0953 01/02/17 1228 01/02/17 2112 01/02/17 2216  BP: 122/81 135/74 (!) 135/99 (!) 140/107  Pulse: (!) 103 64 74 (!) 101  Resp: 19 20 18    Temp: 97.9 F (36.6 C) 97.9 F (36.6 C) 97.3 F (36.3 C)   TempSrc: Oral Oral Oral   SpO2: 98% 97% 98%   Weight: 237 lb 11.2 oz (107.8 kg)       Intake/Output Summary (Last 24 hours) at 01/03/17 0902 Last data filed at 01/02/17 1749  Gross per 24 hour  Intake              360 ml  Output                0 ml  Net              360 ml   Filed Weights   01/02/17 0953  Weight: 237 lb 11.2 oz (107.8 kg)    Telemetry    Atrial fib with a CVR - Personally Reviewed  ECG    Atrial fib with a CVR  Physical Exam   GEN: No acute distress.   Neck: 6 cm JVD Cardiac: RRR, no murmurs, rubs, or gallops.  Respiratory: Clear to auscultation bilaterally. GI: Soft, nontender, non-distended  MS: No edema; No deformity. Neuro:  Nonfocal  Psych: Normal affect   Labs    Chemistry Recent Labs Lab 01/01/17 0845 01/03/17 0256  NA 140 142  K 4.2 4.2  CL 104 106  CO2 26 24  GLUCOSE 106* 93  BUN 24* 26*  CREATININE 1.24 0.99  CALCIUM 9.4 9.1  GFRNONAA 58* >60  GFRAA >60 >60  ANIONGAP 10 12     HematologyNo results for input(s): WBC, RBC, HGB, HCT, MCV, MCH, MCHC, RDW, PLT in the last 168  hours.  Cardiac EnzymesNo results for input(s): TROPONINI in the last 168 hours. No results for input(s): TROPIPOC in the last 168 hours.   BNPNo results for input(s): BNP, PROBNP in the last 168 hours.   DDimer No results for input(s): DDIMER in the last 168 hours.   Radiology    No results found.  Cardiac Studies    Patient Profile     66 y.o. male admitted for atrial fib initiation of Sotalol, now pending DCCV  Assessment & Plan    1. Atrial fib - he has been given 4 doses of Sotalol. He will hopefully undergo DCCV later this morning. 2. HTN - his blood pressure is controlled. Will follow.  Signed, Cristopher Peru, MD  01/03/2017, 9:02 AM  Patient ID: [name], male   DOB: April 28, 1951, 66 y.o.   MRN: [id number]

## 2017-01-04 LAB — BASIC METABOLIC PANEL
ANION GAP: 10 (ref 5–15)
BUN: 19 mg/dL (ref 6–20)
CALCIUM: 9.1 mg/dL (ref 8.9–10.3)
CHLORIDE: 105 mmol/L (ref 101–111)
CO2: 25 mmol/L (ref 22–32)
CREATININE: 1.04 mg/dL (ref 0.61–1.24)
GFR calc non Af Amer: >60 mL/min (ref >60)
Glucose, Bld: 102 mg/dL — ABNORMAL HIGH (ref 65–99)
Potassium: 4.2 mmol/L (ref 3.5–5.1)
SODIUM: 140 mmol/L (ref 135–145)

## 2017-01-04 LAB — MAGNESIUM: MAGNESIUM: 2 mg/dL (ref 1.7–2.4)

## 2017-01-04 MED ORDER — SPIRONOLACTONE 25 MG PO TABS: 25.0000 mg | ORAL_TABLET | Freq: Every day | ORAL | 3 refills | Status: DC

## 2017-01-04 MED ORDER — AMIODARONE HCL 200 MG PO TABS: 200.0000 mg | ORAL_TABLET | Freq: Two times a day (BID) | ORAL | 3 refills | Status: DC

## 2017-01-04 MED ORDER — AMIODARONE HCL 200 MG PO TABS
200.0000 mg | ORAL_TABLET | Freq: Two times a day (BID) | ORAL | Status: DC
  Administered 2017-01-04: 200 mg via ORAL
  Filled 2017-01-04: qty 1

## 2017-01-04 MED ORDER — METOPROLOL TARTRATE 100 MG PO TABS: 100.0000 mg | ORAL_TABLET | Freq: Two times a day (BID) | ORAL | 3 refills | Status: DC

## 2017-01-04 MED ORDER — METOPROLOL TARTRATE 50 MG PO TABS: 50.0000 mg | ORAL_TABLET | Freq: Two times a day (BID) | ORAL | 3 refills | Status: DC

## 2017-01-04 MED ORDER — LOSARTAN POTASSIUM 100 MG PO TABS: 100.0000 mg | ORAL_TABLET | Freq: Every day | ORAL | 3 refills | Status: DC

## 2017-01-04 NOTE — Discharge Summary (Signed)
Discharge Summary    Patient ID: Troy [name],  MRN: [id number], DOB/AGE: Jun 26, 1951 66 y.o.  Admit date: 01/02/2017 Discharge date: 01/04/2017  Primary Care Provider: Velna Hatchet Primary Cardiologist: Dr. Radford Pax Primary Electrophysiologist: Dr. [name]  Discharge Diagnoses    Active Problems:   Atrial fibrillation [facility])  History of Present Illness     Troy [name] is a 66 y.o. male with past medical history of CAD (s/p DES to midLAD and RPDA in 06/2015 with VF arrest), HTN, HLD, MR, and PAF who presented to [facility] on 01/02/2017 for planned Sotalol initiation.   He was seen in the atrial fibrillation clinic and reported good compliance with Eliquis, having not missed any doses. Was started on Sotalol 120mg  BID.   Hospital Course     Consultants: None  He reported doing well the following morning, denying any chest discomfort or dyspnea. QTc remained stable. Was scheduled for DCCV later that morning after receiving his 4th dose of Sotalol. DCCV was performed on 2/24 with return to NSR.  On 2/25, he was noted to be back in atrial fibrillation (having recurred the previous night). Having now failed Sotalol, the plan is for initiation of Amiodarone 200mg  BID. Will resume PTA Lopressor since Sotalol cannot be utilized.   He was last examined by Dr. [name] and deemed stable for discharge. He will be seen for a repeat EKG in 1 week then follow-up with Dr. [name] in 2-3 weeks and possible outpatient DCCV after Amiodarone loading.   _____________  Discharge Vitals Blood pressure (!) 130/97, pulse 98, temperature 98 F (36.7 C), temperature source Oral, resp. rate 16, weight 237 lb 11.2 oz (107.8 kg), SpO2 99 %.  Filed Weights   01/02/17 0953  Weight: 237 lb 11.2 oz (107.8 kg)    Labs & Radiologic Studies     CBC No results for input(s): WBC, NEUTROABS, HGB, HCT, MCV, PLT in the last 72 hours. Basic Metabolic Panel  Recent Labs  01/03/17 0256 01/04/17 0337    NA 142 140  K 4.2 4.2  CL 106 105  CO2 24 25  GLUCOSE 93 102*  BUN 26* 19  CREATININE 0.99 1.04  CALCIUM 9.1 9.1  MG 2.1 2.0   Liver Function Tests No results for input(s): AST, ALT, ALKPHOS, BILITOT, PROT, ALBUMIN in the last 72 hours. No results for input(s): LIPASE, AMYLASE in the last 72 hours. Cardiac Enzymes No results for input(s): CKTOTAL, CKMB, CKMBINDEX, TROPONINI in the last 72 hours. BNP Invalid input(s): POCBNP D-Dimer No results for input(s): DDIMER in the last 72 hours. Hemoglobin A1C No results for input(s): HGBA1C in the last 72 hours. Fasting Lipid Panel No results for input(s): CHOL, HDL, LDLCALC, TRIG, CHOLHDL, LDLDIRECT in the last 72 hours. Thyroid Function Tests No results for input(s): TSH, T4TOTAL, T3FREE, THYROIDAB in the last 72 hours.  Invalid input(s): FREET3  No results found.   Diagnostic Studies/Procedures     DCCV: 01/03/2017  Procedure: Electrical Cardioversion Indications:  Atrial Fibrillation  Procedure Details Consent: Risks of procedure as well as the alternatives and risks of each were explained to the (patient/caregiver).  Consent for procedure obtained. Time Out: Verified patient identification, verified procedure, site/side was marked, verified correct patient position, special equipment/implants available, medications/allergies/relevent history reviewed, required imaging and test results available.  Performed  Patient placed on cardiac monitor, pulse oximetry, supplemental oxygen as necessary.  Sedation given: Pt sedated by anesthesia with diprovan 60 mg IV Pacer pads placed anterior and posterior chest.  Cardioverted 1 time(s).  Cardioverted at 120J.  Evaluation Findings: Post procedure EKG shows: NSR Complications: None Patient did tolerate procedure well.  Disposition   Pt is being discharged home today in good condition.  Follow-up Plans & Appointments    Follow-up Information    Cristopher Peru, MD Follow  up.   Specialty:  Cardiology Why:  The office will contact you to arrange for an EKG in 1 week then follow-up with Dr. [name] in 2-3 weeks.  Contact information: [id number] [place] [address] [phone]            Discharge Medications     Medication List    TAKE these medications   acetaminophen 325 MG tablet Commonly known as:  TYLENOL Take 2 tablets (650 mg total) by mouth every 4 (four) hours as needed for headache or mild pain.   amiodarone 200 MG tablet Commonly known as:  PACERONE Take 1 tablet (200 mg total) by mouth 2 (two) times daily.   apixaban 5 MG Tabs tablet Commonly known as:  ELIQUIS Take 1 tablet (5 mg total) by mouth 2 (two) times daily.   atorvastatin 80 MG tablet Commonly known as:  LIPITOR Take 1 tablet (80 mg total) by mouth daily at 6 PM.   Fish Oil 1000 MG Cpdr Take 2,000 mg by mouth 2 (two) times daily.   isosorbide mononitrate 30 MG 24 hr tablet Commonly known as:  IMDUR Take 30 mg by mouth daily.   losartan 100 MG tablet Commonly known as:  COZAAR Take 1 tablet (100 mg total) by mouth daily.   metoprolol 100 MG tablet Commonly known as:  LOPRESSOR Take 1 tablet (100 mg total) by mouth 2 (two) times daily.   multivitamin with minerals Tabs tablet Take 1 tablet by mouth daily.   niacin 100 MG tablet Take 100 mg by mouth 2 (two) times daily with a meal.   PRESCRIPTION MEDICATION Inhale into the lungs at bedtime. CPAP   spironolactone 25 MG tablet Commonly known as:  ALDACTONE Take 1 tablet (25 mg total) by mouth daily.   TURMERIC PO Take 2,000 mg by mouth 2 (two) times daily. 1 capsule - 1000 mg         Allergies Allergies  Allergen Reactions  . Contrast Media [Iodinated Diagnostic Agents] Other (See Comments)    V-Fib arrest during cardiac cath suspected d/t contrast dye in 2016  . Gluten Meal Other (See Comments)    REACTION: Celiac disease (severe stomach pain)  . Tizanidine Anxiety     Depression  . Whey Other (See Comments)    REACTION: Celiac disease (severe stomach pain)  . Morphine And Related Other (See Comments)    REACTION: "REALLY BAD STOMACH PAIN"     Outstanding Labs/Studies   EKG in 1 week.   Duration of Discharge Encounter   Greater than 30 minutes including physician time.  Signed, Erma Heritage, PA-C 01/04/2017, 9:14 AM  EP Attending  Patient seen and examined. See my note as well.  Mikle Bosworth.D.

## 2017-01-04 NOTE — Progress Notes (Signed)
Progress Note  Patient Name: Troy [name] Date of Encounter: 01/04/2017  Primary Cardiologist: Dr. Radford Pax  Subjective   No chest pain or sob.  Inpatient Medications    Scheduled Meds: . apixaban  5 mg Oral BID  . atorvastatin  80 mg Oral q1800  . isosorbide mononitrate  30 mg Oral Daily  . losartan  100 mg Oral Daily  . metoprolol  50 mg Oral BID  . multivitamin with minerals  1 tablet Oral Daily  . niacin  100 mg Oral BID WC  . omega-3 acid ethyl esters  2 g Oral BID  . sotalol  120 mg Oral Q12H  . spironolactone  25 mg Oral Daily   Continuous Infusions:  PRN Meds: acetaminophen, nitroGLYCERIN   Vital Signs    Vitals:   01/03/17 1015 01/03/17 1352 01/03/17 2109 01/04/17 0342  BP: 128/88 111/77 117/77 (!) 130/97  Pulse: 61 (!) 57 60 98  Resp: 16 16 17 16   Temp: 97.7 F (36.5 C) 98.2 F (36.8 C) 97.6 F (36.4 C) 98 F (36.7 C)  TempSrc:  Oral Oral Oral  SpO2: 96% 97% 96% 99%  Weight:       No intake or output data in the 24 hours ending 01/04/17 0835 Filed Weights   01/02/17 0953  Weight: 237 lb 11.2 oz (107.8 kg)    Telemetry    NSR now atrial fib - Personally Reviewed  ECG    NSR after DCCV, now atrial fib with a CVR - Personally Reviewed  Physical Exam   GEN: No acute distress.   Neck: 6 cm JVD Cardiac: RRR, no murmurs, rubs, or gallops.  Respiratory: Clear to auscultation bilaterally. GI: Soft, nontender, non-distended  MS: No edema; No deformity. Neuro:  Nonfocal  Psych: Normal affect   Labs    Chemistry Recent Labs Lab 01/01/17 0845 01/03/17 0256 01/04/17 0337  NA 140 142 140  K 4.2 4.2 4.2  CL 104 106 105  CO2 26 24 25   GLUCOSE 106* 93 102*  BUN 24* 26* 19  CREATININE 1.24 0.99 1.04  CALCIUM 9.4 9.1 9.1  GFRNONAA 58* >60 >60  GFRAA >60 >60 >60  ANIONGAP 10 12 10      HematologyNo results for input(s): WBC, RBC, HGB, HCT, MCV, MCH, MCHC, RDW, PLT in the last 168 hours.  Cardiac EnzymesNo results for input(s):  TROPONINI in the last 168 hours. No results for input(s): TROPIPOC in the last 168 hours.   BNPNo results for input(s): BNP, PROBNP in the last 168 hours.   DDimer No results for input(s): DDIMER in the last 168 hours.   Radiology    No results found.  Cardiac Studies    Patient Profile     66 y.o. male admitted for initiation of Sotalol who underwent DCCV yesterday and had early return of atrial fib (ERAF) last night.   Assessment & Plan    1. Persistent atrial fib - he has failed sotalol, and his QTC is a little too long to Dofetilide. I will start amio 200 bid and he can be discharged home today. He will need to come back in a week for an ECG and I would like to see in 2-3 weeks. Will plan an outpatient DCCV in 3-4 weeks after amiodarone loading. I would anticipate atrial fib ablation in 6-8 weeks if we can assure that he will maintain NSR on amiodarone.   2. CAD/angina - his symptoms are present with exertion. As we will not DC  on sotalol, he can be continued on metoprolol on his usual home dose.  3. HTN - his blood pressure has been under reasonable control.  Signed, Cristopher Peru, MD  01/04/2017, 8:35 AM  Patient ID: [name], male   DOB: 06-24-1951, 66 y.o.   MRN: [id number]

## 2017-01-04 NOTE — Progress Notes (Signed)
Discharged to home with family office visits in place teaching done  

## 2017-01-05 ENCOUNTER — Encounter (HOSPITAL_COMMUNITY): Payer: Self-pay | Admitting: Cardiology

## 2017-01-12 ENCOUNTER — Encounter (HOSPITAL_COMMUNITY): Payer: Self-pay | Admitting: Nurse Practitioner

## 2017-01-12 ENCOUNTER — Ambulatory Visit (HOSPITAL_COMMUNITY)
Admission: RE | Admit: 2017-01-12 | Discharge: 2017-01-12 | Disposition: A | Discharge status: Home / Self Care | Payer: Medicare Other | Source: Ambulatory Visit | Attending: Nurse Practitioner | Admitting: Nurse Practitioner

## 2017-01-12 VITALS — BP 140/84 | HR 101 | Ht 71.0 in | Wt 237.4 lb

## 2017-01-12 DIAGNOSIS — I25119 Atherosclerotic heart disease of native coronary artery with unspecified angina pectoris: Secondary | ICD-10-CM | POA: Insufficient documentation

## 2017-01-12 DIAGNOSIS — I480 Paroxysmal atrial fibrillation: Secondary | ICD-10-CM | POA: Diagnosis not present

## 2017-01-12 DIAGNOSIS — Z888 Allergy status to other drugs, medicaments and biological substances status: Secondary | ICD-10-CM | POA: Insufficient documentation

## 2017-01-12 DIAGNOSIS — I340 Nonrheumatic mitral (valve) insufficiency: Secondary | ICD-10-CM | POA: Diagnosis not present

## 2017-01-12 DIAGNOSIS — I10 Essential (primary) hypertension: Secondary | ICD-10-CM | POA: Insufficient documentation

## 2017-01-12 DIAGNOSIS — Z885 Allergy status to narcotic agent status: Secondary | ICD-10-CM | POA: Insufficient documentation

## 2017-01-12 DIAGNOSIS — Z91018 Allergy to other foods: Secondary | ICD-10-CM | POA: Insufficient documentation

## 2017-01-12 DIAGNOSIS — Z9842 Cataract extraction status, left eye: Secondary | ICD-10-CM | POA: Diagnosis not present

## 2017-01-12 DIAGNOSIS — Z96652 Presence of left artificial knee joint: Secondary | ICD-10-CM | POA: Insufficient documentation

## 2017-01-12 DIAGNOSIS — M170 Bilateral primary osteoarthritis of knee: Secondary | ICD-10-CM | POA: Insufficient documentation

## 2017-01-12 DIAGNOSIS — Z813 Family history of other psychoactive substance abuse and dependence: Secondary | ICD-10-CM | POA: Insufficient documentation

## 2017-01-12 DIAGNOSIS — Z7902 Long term (current) use of antithrombotics/antiplatelets: Secondary | ICD-10-CM | POA: Diagnosis not present

## 2017-01-12 DIAGNOSIS — Z823 Family history of stroke: Secondary | ICD-10-CM | POA: Insufficient documentation

## 2017-01-12 DIAGNOSIS — Z91041 Radiographic dye allergy status: Secondary | ICD-10-CM | POA: Insufficient documentation

## 2017-01-12 DIAGNOSIS — Z836 Family history of other diseases of the respiratory system: Secondary | ICD-10-CM | POA: Diagnosis not present

## 2017-01-12 DIAGNOSIS — E785 Hyperlipidemia, unspecified: Secondary | ICD-10-CM | POA: Insufficient documentation

## 2017-01-12 DIAGNOSIS — Z7901 Long term (current) use of anticoagulants: Secondary | ICD-10-CM | POA: Insufficient documentation

## 2017-01-12 DIAGNOSIS — Z833 Family history of diabetes mellitus: Secondary | ICD-10-CM | POA: Diagnosis not present

## 2017-01-12 DIAGNOSIS — I481 Persistent atrial fibrillation: Secondary | ICD-10-CM | POA: Diagnosis not present

## 2017-01-12 DIAGNOSIS — G4733 Obstructive sleep apnea (adult) (pediatric): Secondary | ICD-10-CM | POA: Insufficient documentation

## 2017-01-12 DIAGNOSIS — Z955 Presence of coronary angioplasty implant and graft: Secondary | ICD-10-CM | POA: Diagnosis not present

## 2017-01-12 DIAGNOSIS — Z801 Family history of malignant neoplasm of trachea, bronchus and lung: Secondary | ICD-10-CM | POA: Insufficient documentation

## 2017-01-12 DIAGNOSIS — Z9841 Cataract extraction status, right eye: Secondary | ICD-10-CM | POA: Diagnosis not present

## 2017-01-12 DIAGNOSIS — Z8249 Family history of ischemic heart disease and other diseases of the circulatory system: Secondary | ICD-10-CM | POA: Diagnosis not present

## 2017-01-12 DIAGNOSIS — I4819 Other persistent atrial fibrillation: Secondary | ICD-10-CM

## 2017-01-12 MED ORDER — LOSARTAN POTASSIUM 100 MG PO TABS: 100.0000 mg | ORAL_TABLET | Freq: Every day | ORAL | 2 refills | Status: DC

## 2017-01-12 MED ORDER — NITROGLYCERIN 0.3 MG SL SUBL: 0.3000 mg | SUBLINGUAL_TABLET | SUBLINGUAL | 6 refills | Status: DC | PRN

## 2017-01-12 NOTE — Progress Notes (Signed)
Primary Care Physician: Velna Hatchet, MD Referring Physician:Rhonda Barrett, PA   Troy [name] is a 66 y.o. male with a h/o 06/2015 DES mLAD &RPDA w/ VF arrest, PCI in [place] 2010, HTN, HLD, MR, PAF, pulm nodule stable 07/2016 in the afib clinic for evaluation. He was seen for new onset atrial flutter at [facility] 11/06/16 and was prescribed Cardizem and eliquis but did not take due to cost. Pt did not sign up for Medicare D at time of turning 63. He was admitted 1/1 to 1/8 for chest pain, and continued in afib with rvr. He was started on eliquis and had TEE guided cardioversion which was successful. He was seen f/u by Suanne Marker this am and was found to be in afib rvr and was sent to afib clinic to discuss antiarrythmic. Pt thinks he may have had paroxysmal afib for some time and did not appreciate what was going on. His left atrial size is 50 mm and supports this theory and may undermine ability to maintain SR long term. Initially HR was 126 on presentation but at home he is getting v rates in the 80's and after a few minutes of quiet sitting pulse ox showed v rates in the 80's. He feels ok he says. He was on Imdur which was stopped due to dizziness which is good because he takes viagra on a regular basis(not on med list) and was warned not to take NTG within 24 hours of viagra.   He does not smoke or consume significant amounts of  Alcohol or caffeine. He is active and is not overweight. He does have OSA but has not been using his CPAP. Renal function is normal to work with antiarrythmic's and qtc at baseline in SR is 428 ms. No qtc prolonging drugs on board at baseline. He has been taking eliquis since 1/2 and has not been on DOAC's for 3 weeks yet. States no missed doses. He felt much better after cardioversion that he does now in afib.  He was given the choice of sotalol and /or Tikosyn to restore SR. For cost issues he chose sotalol. However, this did not restore SR and he was started loading on  amiodarone. He has been taking x one week. He continues in afib. He reports that he has been having some chest pain in the am for which sl ntg relieves the pain x one tablet. This has occurred 4 out of the 7 mornings last week. He had a cardiac cath in January and his CAD was stable.  Today, he denies symptoms of palpitations, chest pain, shortness of breath, orthopnea, PND, lower extremity edema, dizziness, presyncope, syncope, or neurologic sequela. The patient is tolerating medications without difficulties and is otherwise without complaint today.   Past Medical History:  Diagnosis Date  . Arthritis    "knees" (01/02/2017)  . CAD (coronary artery disease)    a. s/p PCI in 2010 in [place]. b. Unstable angina/LHC [id number] complicated by V fib arrest after contrast injection. Cath 06/19/2015 s/p DES to mid LAD and RPDA.  Marland Kitchen Dyslipidemia   . Essential hypertension   . Lung nodule < 6cm on CT 06/16/15   Right lung  . Mitral regurgitation    a. Mild-mod by echo 06/2015.  . OSA on CPAP   . PAF (paroxysmal atrial fibrillation) ([place])    a. 06/2015 noted to be in new a-fib when arrived with unstable angina. Converted to NSR after being shocked in the cath lab for vfib;  b. 06/2015  Eliquis initiated as PAF noted on event monitor.  . Pulmonary nodule 07/2015   a. 1.5 x 1.2 cm smoothly marginated nodule in the central aspect of the right lower lobe with recommendation correlation with nonemergent PET-CT to exclude a neoplasm , stable by CT 07/2016  . Ventricular fibrillation ([place])    a. occured during cath 06/18/2015.   Past Surgical History:  Procedure Laterality Date  . CARDIAC CATHETERIZATION N/A 06/18/2015   Procedure: Left Heart Cath and Coronary Angiography;  Surgeon: Burnell Blanks, MD;  Location: [place] [facility];  Service: Cardiovascular;  Laterality: N/A;  . CARDIAC CATHETERIZATION N/A 06/19/2015   Procedure: Coronary Stent Intervention;  Surgeon: Peter M Martinique, MD;  Location: [place]  [facility];  Service: Cardiovascular;  Laterality: N/A;  . CARDIAC CATHETERIZATION N/A 11/13/2016   Procedure: Left Heart Cath and Coronary Angiography;  Surgeon: Lorretta Harp, MD;  Location: [place] [facility];  Service: Cardiovascular;  Laterality: N/A;  . CARDIOVERSION N/A 11/17/2016   Procedure: CARDIOVERSION;  Surgeon: Larey Dresser, MD;  Location: [place];  Service: Cardiovascular;  Laterality: N/A;  . CARDIOVERSION N/A 01/03/2017   Procedure: CARDIOVERSION;  Surgeon: Lelon Perla, MD;  Location: [place];  Service: Cardiovascular;  Laterality: N/A;  . CARTILAGE SURGERY Bilateral    "thumbs"  . CATARACT EXTRACTION W/ INTRAOCULAR LENS  IMPLANT, BILATERAL Bilateral   . CORONARY ANGIOPLASTY WITH STENT PLACEMENT  ~ 2007  . JOINT REPLACEMENT    . KNEE ARTHROSCOPY Right   . RETINAL DETACHMENT SURGERY Bilateral    "laser thing"  . TEE WITHOUT CARDIOVERSION N/A 11/17/2016   Procedure: TRANSESOPHAGEAL ECHOCARDIOGRAM (TEE);  Surgeon: Larey Dresser, MD;  Location: [facility];  Service: Cardiovascular;  Laterality: N/A;  . TOTAL KNEE ARTHROPLASTY Left 2000s    Current Outpatient Prescriptions  Medication Sig Dispense Refill  . acetaminophen (TYLENOL) 325 MG tablet Take 2 tablets (650 mg total) by mouth every 4 (four) hours as needed for headache or mild pain.    Marland Kitchen amiodarone (PACERONE) 200 MG tablet Take 1 tablet (200 mg total) by mouth 2 (two) times daily. 60 tablet 3  . apixaban (ELIQUIS) 5 MG TABS tablet Take 1 tablet (5 mg total) by mouth 2 (two) times daily. 60 tablet 3  . atorvastatin (LIPITOR) 80 MG tablet Take 1 tablet (80 mg total) by mouth daily at 6 PM. 30 tablet 6  . isosorbide mononitrate (IMDUR) 30 MG 24 hr tablet Take 30 mg by mouth daily.    Marland Kitchen losartan (COZAAR) 100 MG tablet Take 1 tablet (100 mg total) by mouth daily. 90 tablet 2  . metoprolol (LOPRESSOR) 100 MG tablet Take 1 tablet (100 mg total) by mouth 2 (two) times daily. 180 tablet 3  . Multiple Vitamin  (MULTIVITAMIN WITH MINERALS) TABS tablet Take 1 tablet by mouth daily.    . niacin 100 MG tablet Take 100 mg by mouth 2 (two) times daily with a meal.    . Omega-3 Fatty Acids (FISH OIL) 1000 MG CPDR Take 2,000 mg by mouth 2 (two) times daily.     Marland Kitchen PRESCRIPTION MEDICATION Inhale into the lungs at bedtime. CPAP    . spironolactone (ALDACTONE) 25 MG tablet Take 1 tablet (25 mg total) by mouth daily. 90 tablet 3  . TURMERIC PO Take 2,000 mg by mouth 2 (two) times daily. 1 capsule - 1000 mg    . nitroGLYCERIN (NITROSTAT) 0.3 MG SL tablet Place 1 tablet (0.3 mg total) under the tongue  every 5 (five) minutes as needed for chest pain. 90 tablet 6   No current facility-administered medications for this encounter.     Allergies  Allergen Reactions  . Contrast Media [Iodinated Diagnostic Agents] Other (See Comments)    V-Fib arrest during cardiac cath suspected d/t contrast dye in 2016  . Gluten Meal Other (See Comments)    REACTION: Celiac disease (severe stomach pain)  . Tizanidine Anxiety    Depression  . Whey Other (See Comments)    REACTION: Celiac disease (severe stomach pain)  . Morphine And Related Other (See Comments)    REACTION: "REALLY BAD STOMACH PAIN"    Social History   Social History  . Marital status: Divorced    Spouse name: N/A  . Number of children: N/A  . Years of education: N/A   Occupational History  . Not on file.   Social History Main Topics  . Smoking status: Never Smoker  . Smokeless tobacco: Never Used     Comment: Second-hand exposure through parents  . Alcohol use 1.2 oz/week    2 Glasses of wine per week  . Drug use: No  . Sexual activity: Yes   Other Topics Concern  . Not on file   Social History Narrative   Originally he is from [place], [place]. He moved to [facility] in 2013. He has a dog & cat at home. No bird or mold exposure. Has a hot tub but hasn't used it in 4 months. 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     Family History  Problem Relation Age of  Onset  . Stroke Mother   . Emphysema Mother   . Lung cancer Mother   . Heart attack Father   . Drug abuse Father   . Heart attack Maternal Grandfather   . Heart attack Paternal Grandfather   . Diabetes Sister     ROS- All systems are reviewed and negative except as per the HPI above  Physical Exam: Vitals:   01/12/17 1328  BP: 140/84  Pulse: (!) 101  Weight: 237 lb 6.4 oz (107.7 kg)  Height: 5\' 11"  (1.803 m)   Wt Readings from Last 3 Encounters:  01/12/17 237 lb 6.4 oz (107.7 kg)  01/02/17 237 lb 11.2 oz (107.8 kg)  12/02/16 233 lb (105.7 kg)    Labs: Lab Results  Component Value Date   NA 140 01/04/2017   K 4.2 01/04/2017   CL 105 01/04/2017   CO2 25 01/04/2017   GLUCOSE 102 (H) 01/04/2017   BUN 19 01/04/2017   CREATININE 1.04 01/04/2017   CALCIUM 9.1 01/04/2017   MG 2.0 01/04/2017   Lab Results  Component Value Date   INR 1.13 11/13/2016   Lab Results  Component Value Date   CHOL 94 (L) 08/15/2015   HDL 37 (L) 08/15/2015   LDLCALC 42 08/15/2015   TRIG 74 08/15/2015     GEN- The patient is well appearing, alert and oriented x 3 today.   Head- normocephalic, atraumatic Eyes-  Sclera clear, conjunctiva pink Ears- hearing intact Oropharynx- clear Neck- supple, no JVP Lymph- no cervical lymphadenopathy Lungs- Clear to ausculation bilaterally, normal work of breathing Heart- irregular rate and rhythm, no murmurs, rubs or gallops, PMI not laterally displaced GI- soft, NT, ND, + BS Extremities- no clubbing, cyanosis, or edema MS- no significant deformity or atrophy Skin- no rash or lesion Psych- euthymic mood, full affect Neuro- strength and sensation are intact  EKG- afib with rvr at 101 bpm Echo-- Left  ventricle: The cavity size was normal. There was severe   concentric hypertrophy. Systolic function was normal. The   estimated ejection fraction was in the range of 60% to 65%. Wall   motion was normal; there were no regional wall motion    abnormalities. The study is not technically sufficient to allow   evaluation of LV diastolic function. - Aortic valve: Trileaflet; mildly calcified leaflets. There was   mild regurgitation. - Mitral valve: Mildly thickened leaflets . There was trivial   regurgitation. - Left atrium: Moderately dilated. 50 mm - Right ventricle: The cavity size was mildly dilated. - Right atrium: Severely dilated. - Tricuspid valve: There was trivial regurgitation. - Pulmonary arteries: PA peak pressure: 20 mm Hg (S). - Inferior vena cava: The vessel was normal in size. The   respirophasic diameter changes were in the normal range (= 50%),   consistent with normal central venous pressure.  Impressions:  - Compared to a prior study in 06/2015, the LVEF has improved to   60-65%. There is moderate LAE and severe RAE.   Assessment and Plan: 1. Newly diagnosed symptomatic afib Successful cardioversion 1/8 but EFAF Options are limited by CAD(no 1c agents) and cost ( no medicare D) I think multaq would not be a strong enough drug to control his afib , Tikosyn is out due to cost and I would prefer not to use amiodarone 2/2 relative young age.  Therefore, he has been given option of sotalol Unfortunately he failed sotalol He is loading on amiodarone 200 mg bid, will need to load 3 more weeks before cardioversion.  I think by his readings at home he is fairly well rate controlled He will continue xarelto for now(longterm he may have to use warfarin) for a CHA2DS2VAS of at least 3. 3 weeks uninterrupted use on 1/24. It was brought to his attention that tumeric and fish oil both may contribute to bleeding in combo with apixaban  2. CAD C/o of chest pain in the am's relieved by one sl NTG However, he can play pickel ball later in the day without any exertional chest pain RX renewed I have requested a f/u appointment with Dr. Radford Pax to discuss angina asap  3. HTN Stable  4. OSA Educated re sleep apnea and  atrial fib He states that he has restarted cpap  He will f/u with Dr. [name] 3/20 at which time he will have finished one month of loading amiodarone and will be ready for cardioversion  afib clinic as needed   Butch Penny C. Kierria Feigenbaum, [facility] [address] [place], [place] [address] [phone]

## 2017-01-13 ENCOUNTER — Telehealth: Payer: Self-pay

## 2017-01-13 NOTE — Progress Notes (Signed)
Cardiology Office Note    Date:  01/14/2017   ID:  Troy [name], DOB 05-May-1951, MRN [id number]  PCP:  Velna Hatchet, MD  Cardiologist:  Dr. Radford Pax / Dr. [name]  CC: chest pain   History of Present Illness:  Troy [name] is a 66 y.o. male with a history of HTN, HLD, CAD s/p PCI (2010), DES to mLAD and RPDA (06/2015) c/b VF arrest, OSA complaint with CPAP, PAF with more recent persistent atrial fibrillation on Eliquis who presents to clinic for evaluation of chest pain.  He was admitted in 11/2016 for chest pain. He was found to be in afib with RVR. He underwent TEE/DCCV as well as a nuclear stress test that was high risk (for low EF 33%). He underwent heart cath this same admission which showed patent stents and stable non obstructive CAD. Of note, follow up echo 11/18/16 showed normal LVEF 60-65%, severe concentric hypertrophy, mod LAE and severe RAE.  He has been followed by Roderic Palau NP in the afib clinic for atrial fibrillation. He was successfully cardioverted in 11/2016 but had eventual return of afib. He failed sotolol and is now being loaded on amiodarone. He was seen in the afib clinic 01/12/17 and reported chest pain requiring SL NTG at least 4/7 mornings over the last week. He was added onto my schedule for further evaluation of chest pain.    Today he presents to clinic for follow up. He says this chest pain has been going on for at least 1 year. He mostly gets chest pain early in the AM. This has been going on for much longer than just this past week; he has just not mentioned it before. Last night he even had to take a SL NTG, which immediately helps. He has not been taking Viagra. He plays pickle ball and pain actually seems to get better when he works out. He has chronic SOB with exertion. No LE edema, orthopnea or PND. He is complaint with CPAP. No palpitations. No blood in stool or urine.    Past Medical History:  Diagnosis Date  . Arthritis    "knees" (01/02/2017)  .  CAD (coronary artery disease)    a. s/p PCI in 2010 in [place]. b. Unstable angina/LHC [id number] complicated by V fib arrest after contrast injection. Cath 06/19/2015 s/p DES to mid LAD and RPDA.  Marland Kitchen Dyslipidemia   . Essential hypertension   . Lung nodule < 6cm on CT 06/16/15   Right lung  . Mitral regurgitation    a. Mild-mod by echo 06/2015.  . OSA on CPAP   . PAF (paroxysmal atrial fibrillation) ([place])    a. 06/2015 noted to be in new a-fib when arrived with unstable angina. Converted to NSR after being shocked in the cath lab for vfib;  b. 06/2015 Eliquis initiated as PAF noted on event monitor.  . Pulmonary nodule 07/2015   a. 1.5 x 1.2 cm smoothly marginated nodule in the central aspect of the right lower lobe with recommendation correlation with nonemergent PET-CT to exclude a neoplasm , stable by CT 07/2016  . Ventricular fibrillation ([place])    a. occured during cath 06/18/2015.    Past Surgical History:  Procedure Laterality Date  . CARDIAC CATHETERIZATION N/A 06/18/2015   Procedure: Left Heart Cath and Coronary Angiography;  Surgeon: Burnell Blanks, MD;  Location: [facility];  Service: Cardiovascular;  Laterality: N/A;  . CARDIAC CATHETERIZATION N/A 06/19/2015   Procedure: Coronary Stent Intervention;  Surgeon: Collier Salina  M Martinique, MD;  Location: [facility];  Service: Cardiovascular;  Laterality: N/A;  . CARDIAC CATHETERIZATION N/A 11/13/2016   Procedure: Left Heart Cath and Coronary Angiography;  Surgeon: Lorretta Harp, MD;  Location: [facility];  Service: Cardiovascular;  Laterality: N/A;  . CARDIOVERSION N/A 11/17/2016   Procedure: CARDIOVERSION;  Surgeon: Larey Dresser, MD;  Location: [facility];  Service: Cardiovascular;  Laterality: N/A;  . CARDIOVERSION N/A 01/03/2017   Procedure: CARDIOVERSION;  Surgeon: Lelon Perla, MD;  Location: [facility];  Service: Cardiovascular;  Laterality: N/A;  . CARTILAGE SURGERY Bilateral    "thumbs"  . CATARACT EXTRACTION  W/ INTRAOCULAR LENS  IMPLANT, BILATERAL Bilateral   . CORONARY ANGIOPLASTY WITH STENT PLACEMENT  ~ 2007  . JOINT REPLACEMENT    . KNEE ARTHROSCOPY Right   . RETINAL DETACHMENT SURGERY Bilateral    "laser thing"  . TEE WITHOUT CARDIOVERSION N/A 11/17/2016   Procedure: TRANSESOPHAGEAL ECHOCARDIOGRAM (TEE);  Surgeon: Larey Dresser, MD;  Location: [facility];  Service: Cardiovascular;  Laterality: N/A;  . TOTAL KNEE ARTHROPLASTY Left 2000s    Current Medications: Outpatient Medications Prior to Visit  Medication Sig Dispense Refill  . amiodarone (PACERONE) 200 MG tablet Take 1 tablet (200 mg total) by mouth 2 (two) times daily. 60 tablet 3  . apixaban (ELIQUIS) 5 MG TABS tablet Take 1 tablet (5 mg total) by mouth 2 (two) times daily. 60 tablet 3  . atorvastatin (LIPITOR) 80 MG tablet Take 1 tablet (80 mg total) by mouth daily at 6 PM. 30 tablet 6  . losartan (COZAAR) 100 MG tablet Take 1 tablet (100 mg total) by mouth daily. 90 tablet 2  . metoprolol (LOPRESSOR) 100 MG tablet Take 1 tablet (100 mg total) by mouth 2 (two) times daily. 180 tablet 3  . Multiple Vitamin (MULTIVITAMIN WITH MINERALS) TABS tablet Take 1 tablet by mouth daily.    . niacin 100 MG tablet Take 100 mg by mouth 2 (two) times daily with a meal.    . nitroGLYCERIN (NITROSTAT) 0.3 MG SL tablet Place 1 tablet (0.3 mg total) under the tongue every 5 (five) minutes as needed for chest pain. 90 tablet 6  . Omega-3 Fatty Acids (FISH OIL) 1000 MG CPDR Take 2,000 mg by mouth 2 (two) times daily.     Marland Kitchen PRESCRIPTION MEDICATION Inhale into the lungs at bedtime. CPAP    . spironolactone (ALDACTONE) 25 MG tablet Take 1 tablet (25 mg total) by mouth daily. 90 tablet 3  . TURMERIC PO Take 2,000 mg by mouth 2 (two) times daily. 1 capsule - 1000 mg    . isosorbide mononitrate (IMDUR) 30 MG 24 hr tablet Take 30 mg by mouth daily.    Marland Kitchen acetaminophen (TYLENOL) 325 MG tablet Take 2 tablets (650 mg total) by mouth every 4 (four) hours as needed  for headache or mild pain. (Patient not taking: Reported on 01/14/2017)     No facility-administered medications prior to visit.      Allergies:   Contrast media [iodinated diagnostic agents]; Gluten meal; Tizanidine; Whey; and Morphine and related   Social History   Social History  . Marital status: Divorced    Spouse name: N/A  . Number of children: N/A  . Years of education: N/A   Social History Main Topics  . Smoking status: Never Smoker  . Smokeless tobacco: Never Used     Comment: Second-hand exposure through parents  . Alcohol use 1.2 oz/week  2 Glasses of wine per week  . Drug use: No  . Sexual activity: Yes   Other Topics Concern  . None   Social History Narrative   Originally he is from [place], [place]. He moved to [facility] in 2013. He has a dog & cat at home. No bird or mold exposure. Has a hot tub but hasn't used it in 4 months. 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      Family History:  The patient's family history includes Diabetes in his sister; Drug abuse in his father; Emphysema in his mother; Heart attack in his father, maternal grandfather, and paternal grandfather; Lung cancer in his mother; Stroke in his mother.     ROS:   Please see the history of present illness.    ROS All other systems reviewed and are negative.   PHYSICAL EXAM:   VS:  BP 110/84   Pulse 74   Ht 5\' 11"  (1.803 m)   Wt 236 lb 12.8 oz (107.4 kg)   SpO2 97%   BMI 33.03 kg/m    GEN: Well nourished, well developed, in no acute distress  HEENT: normal  Neck: no JVD, carotid bruits, or masses Cardiac: irreg irreg; no murmurs, rubs, or gallops,no edema  Respiratory:  clear to auscultation bilaterally, normal work of breathing GI: soft, nontender, nondistended, + BS MS: no deformity or atrophy  Skin: warm and dry, no rash Neuro:  Alert and Oriented x 3, Strength and sensation are intact Psych: euthymic mood, full affect    Wt Readings from Last 3 Encounters:  01/14/17 236 lb 12.8 oz (107.4  kg)  01/12/17 237 lb 6.4 oz (107.7 kg)  01/02/17 237 lb 11.2 oz (107.8 kg)      Studies/Labs Reviewed:   EKG:  EKG is ordered today.  The ekg ordered today demonstrates afib HR 74, q waves in III and AVF, similar to previous tracings.   Recent Labs: 11/10/2016: TSH 1.090 11/11/2016: ALT 27 11/17/2016: Hemoglobin 15.2; Platelets 176 01/04/2017: BUN 19; Creatinine, Ser 1.04; Magnesium 2.0; Potassium 4.2; Sodium 140   Lipid Panel    Component Value Date/Time   CHOL 94 (L) 08/15/2015 0912   TRIG 74 08/15/2015 0912   HDL 37 (L) 08/15/2015 0912   CHOLHDL 2.5 08/15/2015 0912   VLDL 15 08/15/2015 0912   LDLCALC 42 08/15/2015 0912    Additional studies/ records that were reviewed today include:  Nuc study 11/11/16  IMPRESSION: 1. Small focus ischemia at the apex. 2. Global hypokinesia 3. Left ventricular ejection fraction 33% 4. Non invasive risk stratification*: High (risk categorization based on low ejection fraction).    Cath by Dr. Gwenlyn Found 11/13/16  Left Heart Cath and Coronary Angiography  Conclusion     Ost RPDA to RPDA lesion, 0 %stenosed.  Prox Cx to Mid Cx lesion, 0 %stenosed.  Prox LAD to Mid LAD lesion, 0 %stenosed.  Ost 1st Diag lesion, 80 %stenosed.  Mid RCA lesion, 60 %stenosed.  IMPRESSION: Mr. Groot stents are all patent. He has no cough or lesions. An LV gram was not performed today. The LVEDP was measured at 18. The sheath was removed and a TR band was placed on the right wrist to achieve patent hemostasis. The patient left the lab in stable condition. I believe that a novel oral anticoagulant will be restarted with the intention of performing DC cardioversion. The patient left the lab in stable condition.     11/17/16 TEE: Normal LV size with mild LV hypertrophy. EF 50%  with diffuse hypokinesis. Normal RV size with mildly decreased systolic function. Moderateleft atrial enlargement, no LAA thrombus. Normal right atrium. No PFO/ASD, negative bubble  study. Mild mitral regurgitation, trivial tricuspid regurgitation. Trileaflet aortic valve with trivial regurgitation, no stenosis. Normal caliber aorta with minimal plaque.   2D ECHO: 11/18/2016  LV EF: 60% -   65% Study Conclusions - Left ventricle: The cavity size was normal. There was severe   concentric hypertrophy. Systolic function was normal. The   estimated ejection fraction was in the range of 60% to 65%. Wall   motion was normal; there were no regional wall motion   abnormalities. The study is not technically sufficient to allow   evaluation of LV diastolic function. - Aortic valve: Trileaflet; mildly calcified leaflets. There was   mild regurgitation. - Mitral valve: Mildly thickened leaflets . There was trivial   regurgitation. - Left atrium: Moderately dilated. - Right ventricle: The cavity size was mildly dilated. - Right atrium: Severely dilated. - Tricuspid valve: There was trivial regurgitation. - Pulmonary arteries: PA peak pressure: 20 mm Hg (S). - Inferior vena cava: The vessel was normal in size. The   respirophasic diameter changes were in the normal range (= 50%),   consistent with normal central venous pressure. Impressions: - Compared to a prior study in 06/2015, the LVEF has improved to   60-65%. There is moderate LAE and severe RAE.   ASSESSMENT & PLAN:    Chest pain: this has actually been going on for quite some time (past year), he just never reported it. ECG with no acute ST or TW changes. It is nitrate responsive. He had a heart cath in 11/2016 which showed stable CAD with patent stents. Will increase imdur from 30mg  to 60mg  daily. He will call us if anything gets worse.   CAD: see above. No asa given Eliquis use. Continue BB and statin. Increase imdur  Persistent atrial fibrillation: being loaded with amiodarone. HR well controlled today. Followed by Doristine Devoid in afib clinic. Continue Eliquis 5mg  BID for CHADSVASC of at least 3 (HTN, age, vasc  Dz)  HTN: BP well controlled today.   HLD: continue statin   OSA: complaint with CPAP   Medication Adjustments/Labs and Tests Ordered: Current medicines are reviewed at length with the patient today.  Concerns regarding medicines are outlined above.  Medication changes, Labs and Tests ordered today are listed in the Patient Instructions below. Patient Instructions  Medication Instructions:  Your physician has recommended you make the following change in your medication:  1.  INCREASE the Imdur to 60 mg taking 1 tablet daily  Labwork: None ordered  Testing/Procedures: None ordered  Follow-Up: Your physician recommends that you schedule a follow-up appointment in: 3 MONTHS WITH DR. Radford Pax    Any Other Special Instructions Will Be Listed Below (If Applicable).     If you need a refill on your cardiac medications before your next appointment, please call your pharmacy.      Signed, Angelena Form, PA-C  01/14/2017 10:50 AM    [facility] [place], [place], [place]  [address] Phone: [phone]; Fax: [phone]

## 2017-01-13 NOTE — Telephone Encounter (Signed)
Per Dr. Radford Pax, scheduled patient tomorrow with Troy Form, PA for evaluation of CP (see [facility] Less [facility] note). Patient was grateful for call.

## 2017-01-13 NOTE — Telephone Encounter (Signed)
-----   Message from Sueanne Margarita, MD sent at 01/12/2017  8:23 PM EST ----- Please get him in with extender this week for CP.  May need to add nitrate  Traci ----- Message ----- From: Sherran Needs, NP Sent: 01/12/2017   4:44 PM To: Sueanne Margarita, MD, Evans Lance, MD

## 2017-01-14 ENCOUNTER — Ambulatory Visit (INDEPENDENT_AMBULATORY_CARE_PROVIDER_SITE_OTHER): Payer: Medicare Other | Admitting: Physician Assistant

## 2017-01-14 ENCOUNTER — Encounter: Payer: Self-pay | Admitting: Physician Assistant

## 2017-01-14 VITALS — BP 110/84 | HR 74 | Ht 71.0 in | Wt 236.8 lb

## 2017-01-14 DIAGNOSIS — I10 Essential (primary) hypertension: Secondary | ICD-10-CM

## 2017-01-14 DIAGNOSIS — I25119 Atherosclerotic heart disease of native coronary artery with unspecified angina pectoris: Secondary | ICD-10-CM

## 2017-01-14 DIAGNOSIS — I200 Unstable angina: Secondary | ICD-10-CM

## 2017-01-14 DIAGNOSIS — I481 Persistent atrial fibrillation: Secondary | ICD-10-CM

## 2017-01-14 DIAGNOSIS — E785 Hyperlipidemia, unspecified: Secondary | ICD-10-CM | POA: Diagnosis not present

## 2017-01-14 DIAGNOSIS — Z9989 Dependence on other enabling machines and devices: Secondary | ICD-10-CM | POA: Diagnosis not present

## 2017-01-14 DIAGNOSIS — G4733 Obstructive sleep apnea (adult) (pediatric): Secondary | ICD-10-CM | POA: Diagnosis not present

## 2017-01-14 DIAGNOSIS — I209 Angina pectoris, unspecified: Secondary | ICD-10-CM

## 2017-01-14 DIAGNOSIS — I4819 Other persistent atrial fibrillation: Secondary | ICD-10-CM

## 2017-01-14 MED ORDER — ISOSORBIDE MONONITRATE ER 60 MG PO TB24: 60.0000 mg | ORAL_TABLET | Freq: Every day | ORAL | 1 refills | Status: DC

## 2017-01-14 NOTE — Patient Instructions (Addendum)
Medication Instructions:  Your physician has recommended you make the following change in your medication:  1.  INCREASE the Imdur to 60 mg taking 1 tablet daily  Labwork: None ordered  Testing/Procedures: None ordered  Follow-Up: Your physician recommends that you schedule a follow-up appointment in: 3 MONTHS WITH DR. Radford Pax    Any Other Special Instructions Will Be Listed Below (If Applicable).     If you need a refill on your cardiac medications before your next appointment, please call your pharmacy.

## 2017-01-15 ENCOUNTER — Other Ambulatory Visit (HOSPITAL_COMMUNITY): Payer: Self-pay | Admitting: *Deleted

## 2017-01-15 ENCOUNTER — Other Ambulatory Visit: Payer: Self-pay | Admitting: Internal Medicine

## 2017-01-15 MED ORDER — LOSARTAN POTASSIUM 100 MG PO TABS: 100.0000 mg | ORAL_TABLET | Freq: Every day | ORAL | 2 refills | Status: DC

## 2017-01-19 ENCOUNTER — Ambulatory Visit: Payer: Medicare Other | Admitting: Cardiology

## 2017-01-20 ENCOUNTER — Encounter: Payer: Self-pay | Admitting: Internal Medicine

## 2017-01-27 ENCOUNTER — Encounter: Payer: Self-pay | Admitting: Internal Medicine

## 2017-01-27 ENCOUNTER — Ambulatory Visit (INDEPENDENT_AMBULATORY_CARE_PROVIDER_SITE_OTHER): Payer: Medicare Other | Admitting: Internal Medicine

## 2017-01-27 VITALS — BP 126/90 | HR 75 | Ht 71.0 in | Wt 236.0 lb

## 2017-01-27 DIAGNOSIS — I4819 Other persistent atrial fibrillation: Secondary | ICD-10-CM

## 2017-01-27 DIAGNOSIS — I481 Persistent atrial fibrillation: Secondary | ICD-10-CM

## 2017-01-27 DIAGNOSIS — I200 Unstable angina: Secondary | ICD-10-CM

## 2017-01-27 NOTE — Patient Instructions (Addendum)
Medication Instructions:  Your physician recommends that you continue on your current medications as directed. Please refer to the Current Medication list given to you today.   Labwork: Your physician recommends that you return for lab work on: 01/30/17 for BMET, CBC   Testing/Procedures: Your physician has recommended that you have a Cardioversion (DCCV). Electrical Cardioversion uses a jolt of electricity to your heart either through paddles or wired patches attached to your chest. This is a controlled, usually prescheduled, procedure. Defibrillation is done under light anesthesia in the hospital, and you usually go home the day of the procedure. This is done to get your heart back into a normal rhythm. You are not awake for the procedure. Please see the instruction sheet given to you today.     Follow-Up: Your physician recommends that you schedule a follow-up appointment in: 5 weeks with Dr. [name].    Any Other Special Instructions Will Be Listed Below (If Applicable).   Electrical Cardioversion Electrical cardioversion is the delivery of a jolt of electricity to restore a normal rhythm to the heart. A rhythm that is too fast or is not regular keeps the heart from pumping well. In this procedure, sticky patches or metal paddles are placed on the chest to deliver electricity to the heart from a device. This procedure may be done in an emergency if:  There is low or no blood pressure as a result of the heart rhythm.  Normal rhythm must be restored as fast as possible to protect the brain and heart from further damage.  It may save a life. This procedure may also be done for irregular or fast heart rhythms that are not immediately life-threatening. Tell a health care provider about:  Any allergies you have.  All medicines you are taking, including vitamins, herbs, eye drops, creams, and over-the-counter medicines.  Any problems you or family members have had with anesthetic  medicines.  Any blood disorders you have.  Any surgeries you have had.  Any medical conditions you have.  Whether you are pregnant or may be pregnant. What are the risks? Generally, this is a safe procedure. However, problems may occur, including:  Allergic reactions to medicines.  A blood clot that breaks free and travels to other parts of your body.  The possible return of an abnormal heart rhythm within hours or days after the procedure.  Your heart stopping (cardiac arrest). This is rare. What happens before the procedure? Medicines   Your health care provider may have you start taking:  Blood-thinning medicines (anticoagulants) so your blood does not clot as easily.  Medicines may be given to help stabilize your heart rate and rhythm.  Ask your health care provider about changing or stopping your regular medicines. This is especially important if you are taking diabetes medicines or blood thinners. General instructions   Plan to have someone take you home from the hospital or clinic.  If you will be going home right after the procedure, plan to have someone with you for 24 hours.  Follow instructions from your health care provider about eating or drinking restrictions. What happens during the procedure?  To lower your risk of infection:  Your health care team will wash or sanitize their hands.  Your skin will be washed with soap.  An IV tube will be inserted into one of your veins.  You will be given a medicine to help you relax (sedative).  Sticky patches (electrodes) or metal paddles may be placed on your chest.  An electrical shock will be delivered. The procedure may vary among health care providers and hospitals. What happens after the procedure?  Your blood pressure, heart rate, breathing rate, and blood oxygen level will be monitored until the medicines you were given have worn off.  Do not drive for 24 hours if you were given a sedative.  Your  heart rhythm will be watched to make sure it does not change. This information is not intended to replace advice given to you by your health care provider. Make sure you discuss any questions you have with your health care provider. Document Released: 10/17/2002 Document Revised: 06/25/2016 Document Reviewed: 05/02/2016 Elsevier Interactive Patient Education  2017 [employer].    If you need a refill on your cardiac medications before your next appointment, please call your pharmacy.

## 2017-01-27 NOTE — Progress Notes (Signed)
HPI [name] returns today for followup. He is a pleasant 66 yo man with persistent atrial fib who had ERAF after DCCV. He was placed on amiodarone. He feels better as his ventricular rate is under better control. No other complaints today. His dyspnea has resolved. Allergies  Allergen Reactions  . Contrast Media [Iodinated Diagnostic Agents] Other (See Comments)    V-Fib arrest during cardiac cath suspected d/t contrast dye in 2016  . Gluten Meal Other (See Comments)    REACTION: Celiac disease (severe stomach pain)  . Tizanidine Anxiety    Depression  . Whey Other (See Comments)    REACTION: Celiac disease (severe stomach pain)  . Morphine And Related Other (See Comments)    REACTION: "REALLY BAD STOMACH PAIN"     Current Outpatient Prescriptions  Medication Sig Dispense Refill  . amiodarone (PACERONE) 200 MG tablet Take 1 tablet (200 mg total) by mouth 2 (two) times daily. 60 tablet 3  . apixaban (ELIQUIS) 5 MG TABS tablet Take 1 tablet (5 mg total) by mouth 2 (two) times daily. 60 tablet 3  . atorvastatin (LIPITOR) 80 MG tablet Take 1 tablet (80 mg total) by mouth daily at 6 PM. 30 tablet 6  . isosorbide mononitrate (IMDUR) 60 MG 24 hr tablet Take 1 tablet (60 mg total) by mouth daily. 90 tablet 1  . LORazepam (ATIVAN) 0.5 MG tablet Take 0.5 mg by mouth daily as needed for anxiety.    Marland Kitchen losartan (COZAAR) 100 MG tablet Take 1 tablet (100 mg total) by mouth daily. 30 tablet 2  . metoprolol (LOPRESSOR) 100 MG tablet Take 1 tablet (100 mg total) by mouth 2 (two) times daily. 180 tablet 3  . Multiple Vitamin (MULTIVITAMIN WITH MINERALS) TABS tablet Take 1 tablet by mouth daily.    . niacin 100 MG tablet Take 100 mg by mouth 2 (two) times daily with a meal.    . nitroGLYCERIN (NITROSTAT) 0.3 MG SL tablet Place 1 tablet (0.3 mg total) under the tongue every 5 (five) minutes as needed for chest pain. 90 tablet 6  . Omega-3 Fatty Acids (FISH OIL) 1000 MG CPDR Take 2,000 mg by  mouth 2 (two) times daily.     Marland Kitchen PRESCRIPTION MEDICATION Inhale into the lungs at bedtime. CPAP    . spironolactone (ALDACTONE) 25 MG tablet Take 1 tablet (25 mg total) by mouth daily. 90 tablet 3  . TURMERIC PO Take 2,000 mg by mouth 2 (two) times daily. 1 capsule - 1000 mg     No current facility-administered medications for this visit.      Past Medical History:  Diagnosis Date  . Arthritis    "knees" (01/02/2017)  . CAD (coronary artery disease)    a. s/p PCI in 2010 in [place]. b. Unstable angina/LHC 4/0/3474 complicated by V fib arrest after contrast injection. Cath 06/19/2015 s/p DES to mid LAD and RPDA.  Marland Kitchen Dyslipidemia   . Essential hypertension   . Lung nodule < 6cm on CT 06/16/15   Right lung  . Mitral regurgitation    a. Mild-mod by echo 06/2015.  . OSA on CPAP   . PAF (paroxysmal atrial fibrillation) ([place])    a. 06/2015 noted to be in new a-fib when arrived with unstable angina. Converted to NSR after being shocked in the cath lab for vfib;  b. 06/2015 Eliquis initiated as PAF noted on event monitor.  . Pulmonary nodule 07/2015   a. 1.5 x 1.2 cm smoothly  marginated nodule in the central aspect of the right lower lobe with recommendation correlation with nonemergent PET-CT to exclude a neoplasm , stable by CT 07/2016  . Ventricular fibrillation ([place])    a. occured during cath 06/18/2015.    ROS:   All systems reviewed and negative except as noted in the HPI.   Past Surgical History:  Procedure Laterality Date  . CARDIAC CATHETERIZATION N/A 06/18/2015   Procedure: Left Heart Cath and Coronary Angiography;  Surgeon: Burnell Blanks, MD;  Location: [facility];  Service: Cardiovascular;  Laterality: N/A;  . CARDIAC CATHETERIZATION N/A 06/19/2015   Procedure: Coronary Stent Intervention;  Surgeon: Peter M Martinique, MD;  Location: [facility];  Service: Cardiovascular;  Laterality: N/A;  . CARDIAC CATHETERIZATION N/A 11/13/2016   Procedure: Left Heart Cath and  Coronary Angiography;  Surgeon: Lorretta Harp, MD;  Location: [facility];  Service: Cardiovascular;  Laterality: N/A;  . CARDIOVERSION N/A 11/17/2016   Procedure: CARDIOVERSION;  Surgeon: Larey Dresser, MD;  Location: [facility];  Service: Cardiovascular;  Laterality: N/A;  . CARDIOVERSION N/A 01/03/2017   Procedure: CARDIOVERSION;  Surgeon: Lelon Perla, MD;  Location: [facility];  Service: Cardiovascular;  Laterality: N/A;  . CARTILAGE SURGERY Bilateral    "thumbs"  . CATARACT EXTRACTION W/ INTRAOCULAR LENS  IMPLANT, BILATERAL Bilateral   . CORONARY ANGIOPLASTY WITH STENT PLACEMENT  ~ 2007  . JOINT REPLACEMENT    . KNEE ARTHROSCOPY Right   . RETINAL DETACHMENT SURGERY Bilateral    "laser thing"  . TEE WITHOUT CARDIOVERSION N/A 11/17/2016   Procedure: TRANSESOPHAGEAL ECHOCARDIOGRAM (TEE);  Surgeon: Larey Dresser, MD;  Location: [facility];  Service: Cardiovascular;  Laterality: N/A;  . TOTAL KNEE ARTHROPLASTY Left 2000s     Family History  Problem Relation Age of Onset  . Stroke Mother   . Emphysema Mother   . Lung cancer Mother   . Heart attack Father   . Drug abuse Father   . Heart attack Maternal Grandfather   . Heart attack Paternal Grandfather   . Diabetes Sister      Social History   Social History  . Marital status: Divorced    Spouse name: N/A  . Number of children: N/A  . Years of education: N/A   Occupational History  . Not on file.   Social History Main Topics  . Smoking status: Never Smoker  . Smokeless tobacco: Never Used     Comment: Second-hand exposure through parents  . Alcohol use 1.2 oz/week    2 Glasses of wine per week  . Drug use: No  . Sexual activity: Yes   Other Topics Concern  . Not on file   Social History Narrative   Originally he is from [place], [place]. He moved to [facility] in 2013. He has a dog & cat at home. No bird or mold exposure. Has a hot tub but hasn't used it in 4 months. 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      BP  126/90   Pulse 75   Ht 5\' 11"  (1.803 m)   Wt 236 lb (107 kg)   SpO2 96%   BMI 32.92 kg/m   Physical Exam:  Well appearing 67 yo man, NAD HEENT: Unremarkable Neck:  7 cm JVD, no thyromegally Lymphatics:  No adenopathy Back:  No CVA tenderness Lungs:  Clear with no wheezes HEART:  Regular rate rhythm, no murmurs, no rubs, no clicks Abd:  soft, positive bowel sounds,  no organomegally, no rebound, no guarding Ext:  2 plus pulses, no edema, no cyanosis, no clubbing Skin:  No rashes no nodules Neuro:  CN II through XII intact, motor grossly intact  EKG - atrial fib with a controlled VR.   Assess/Plan: 1. Persistent atrial fib - he is doing well as his rate is under better control. He has been anti-coagulated and has been on amio for over 3 weeks. He will come in next week for DCCV. 2. HTN - his blood pressure today is controlled. Will follow.  3. CAD - he denies anginal symptoms. Will follow. 4. Dyslipidemia - he will continue his statin therapy.  Troy [name].D.

## 2017-01-28 ENCOUNTER — Other Ambulatory Visit (HOSPITAL_COMMUNITY): Payer: Self-pay | Admitting: *Deleted

## 2017-01-28 MED ORDER — METOPROLOL TARTRATE 100 MG PO TABS: 100.0000 mg | ORAL_TABLET | Freq: Two times a day (BID) | ORAL | 3 refills | Status: DC

## 2017-01-30 ENCOUNTER — Other Ambulatory Visit: Payer: Medicare Other | Admitting: *Deleted

## 2017-01-30 DIAGNOSIS — I481 Persistent atrial fibrillation: Secondary | ICD-10-CM | POA: Diagnosis not present

## 2017-01-30 DIAGNOSIS — I4819 Other persistent atrial fibrillation: Secondary | ICD-10-CM

## 2017-01-30 LAB — CBC
Hematocrit: 44.3 % (ref 37.5–51.0)
Hemoglobin: 14.6 g/dL (ref 13.0–17.7)
MCH: 29.4 pg (ref 26.6–33.0)
MCHC: 33 g/dL (ref 31.5–35.7)
MCV: 89 fL (ref 79–97)
Platelets: 162 x10E3/uL (ref 150–379)
RBC: 4.96 x10E6/uL (ref 4.14–5.80)
RDW: 14.8 % (ref 12.3–15.4)
WBC: 6.8 x10E3/uL (ref 3.4–10.8)

## 2017-01-30 LAB — BASIC METABOLIC PANEL WITH GFR
BUN/Creatinine Ratio: 17 (ref 10–24)
BUN: 19 mg/dL (ref 8–27)
CO2: 23 mmol/L (ref 18–29)
Calcium: 9.6 mg/dL (ref 8.6–10.2)
Chloride: 100 mmol/L (ref 96–106)
Creatinine, Ser: 1.14 mg/dL (ref 0.76–1.27)
GFR calc Af Amer: 77 mL/min/1.73
GFR calc non Af Amer: 66 mL/min/1.73
Glucose: 95 mg/dL (ref 65–99)
Potassium: 4.4 mmol/L (ref 3.5–5.2)
Sodium: 140 mmol/L (ref 134–144)

## 2017-02-03 ENCOUNTER — Telehealth: Payer: Self-pay | Admitting: Internal Medicine

## 2017-02-03 NOTE — Telephone Encounter (Signed)
Patient calling and states that he has been having some anxiety about his cardioversion scheduled for 3/29 with Dr. [name]. Patient is requesting for Dr. [name] to prescribe him something for his anxiety. Patient has ativan on his med list. Patient was instructed to take his ativan for anxiety. Patient stated that he was not aware that it was on his med list. The patient states that he does still have some. Patient states that he will try and take the ativan and see if that helps.

## 2017-02-03 NOTE — Telephone Encounter (Signed)
New message      Pt is getting anxious about the upcoming cardioversion.  He want [place] to call him in something to calm him down.  If yes, please call it in to [place] at [employer] and [address].  Pt want to talk to nurse first.

## 2017-02-05 ENCOUNTER — Encounter (HOSPITAL_COMMUNITY): Payer: Self-pay | Admitting: Internal Medicine

## 2017-02-05 ENCOUNTER — Ambulatory Visit (HOSPITAL_COMMUNITY): Admit: 2017-02-05 | Payer: Medicare Other | Admitting: Cardiovascular Disease

## 2017-02-05 ENCOUNTER — Ambulatory Visit (HOSPITAL_COMMUNITY)
Admission: AD | Admit: 2017-02-05 | Discharge: 2017-02-05 | Disposition: A | Discharge status: Home / Self Care | Payer: Medicare Other | Source: Ambulatory Visit | Attending: Internal Medicine | Admitting: Internal Medicine

## 2017-02-05 ENCOUNTER — Encounter (HOSPITAL_COMMUNITY)
Admission: AD | Disposition: A | Discharge status: Home / Self Care | Payer: Self-pay | Source: Ambulatory Visit | Attending: Internal Medicine

## 2017-02-05 ENCOUNTER — Encounter (HOSPITAL_COMMUNITY): Payer: Self-pay

## 2017-02-05 DIAGNOSIS — Z91041 Radiographic dye allergy status: Secondary | ICD-10-CM | POA: Insufficient documentation

## 2017-02-05 DIAGNOSIS — M170 Bilateral primary osteoarthritis of knee: Secondary | ICD-10-CM | POA: Diagnosis not present

## 2017-02-05 DIAGNOSIS — Z7901 Long term (current) use of anticoagulants: Secondary | ICD-10-CM | POA: Insufficient documentation

## 2017-02-05 DIAGNOSIS — E785 Hyperlipidemia, unspecified: Secondary | ICD-10-CM | POA: Diagnosis not present

## 2017-02-05 DIAGNOSIS — I340 Nonrheumatic mitral (valve) insufficiency: Secondary | ICD-10-CM | POA: Insufficient documentation

## 2017-02-05 DIAGNOSIS — I2510 Atherosclerotic heart disease of native coronary artery without angina pectoris: Secondary | ICD-10-CM | POA: Diagnosis not present

## 2017-02-05 DIAGNOSIS — Z885 Allergy status to narcotic agent status: Secondary | ICD-10-CM | POA: Insufficient documentation

## 2017-02-05 DIAGNOSIS — I481 Persistent atrial fibrillation: Secondary | ICD-10-CM | POA: Insufficient documentation

## 2017-02-05 DIAGNOSIS — I4891 Unspecified atrial fibrillation: Secondary | ICD-10-CM | POA: Diagnosis not present

## 2017-02-05 DIAGNOSIS — Z955 Presence of coronary angioplasty implant and graft: Secondary | ICD-10-CM | POA: Diagnosis not present

## 2017-02-05 DIAGNOSIS — G4733 Obstructive sleep apnea (adult) (pediatric): Secondary | ICD-10-CM | POA: Insufficient documentation

## 2017-02-05 DIAGNOSIS — I10 Essential (primary) hypertension: Secondary | ICD-10-CM | POA: Insufficient documentation

## 2017-02-05 DIAGNOSIS — Z8249 Family history of ischemic heart disease and other diseases of the circulatory system: Secondary | ICD-10-CM | POA: Diagnosis not present

## 2017-02-05 HISTORY — PX: CARDIOVERSION: EP1203

## 2017-02-05 SURGERY — CARDIOVERSION (CATH LAB)
Anesthesia: LOCAL

## 2017-02-05 SURGERY — CARDIOVERSION
Anesthesia: Monitor Anesthesia Care

## 2017-02-05 MED ORDER — ACETAMINOPHEN 325 MG PO TABS: 650.0000 mg | ORAL_TABLET | ORAL | Status: DC | PRN

## 2017-02-05 MED ORDER — SODIUM CHLORIDE 0.9 % IV SOLN
INTRAVENOUS | Status: DC
  Administered 2017-02-05: 10:00:00 via INTRAVENOUS

## 2017-02-05 MED ORDER — MIDAZOLAM HCL 5 MG/5ML IJ SOLN
INTRAMUSCULAR | Status: AC
  Filled 2017-02-05: qty 10

## 2017-02-05 MED ORDER — FENTANYL CITRATE (PF) 100 MCG/2ML IJ SOLN
INTRAMUSCULAR | Status: AC
  Filled 2017-02-05: qty 2

## 2017-02-05 MED ORDER — ONDANSETRON HCL 4 MG/2ML IJ SOLN: 4.0000 mg | Freq: Four times a day (QID) | INTRAMUSCULAR | Status: DC | PRN

## 2017-02-05 MED ORDER — MIDAZOLAM HCL 2 MG/2ML IJ SOLN
INTRAMUSCULAR | Status: DC | PRN
  Administered 2017-02-05 (×4): 2 mg via INTRAVENOUS

## 2017-02-05 MED ORDER — FENTANYL CITRATE (PF) 100 MCG/2ML IJ SOLN
INTRAMUSCULAR | Status: DC | PRN
  Administered 2017-02-05 (×3): 25 ug via INTRAVENOUS

## 2017-02-05 SURGICAL SUPPLY — 1 items: PAD DEFIB LIFELINK (PAD) ×2 IMPLANT

## 2017-02-05 NOTE — H&P (View-Only) (Signed)
HPI [name] returns today for followup. He is a pleasant 66 yo man with persistent atrial fib who had ERAF after DCCV. He was placed on amiodarone. He feels better as his ventricular rate is under better control. No other complaints today. His dyspnea has resolved. Allergies  Allergen Reactions  . Contrast Media [Iodinated Diagnostic Agents] Other (See Comments)    V-Fib arrest during cardiac cath suspected d/t contrast dye in 2016  . Gluten Meal Other (See Comments)    REACTION: Celiac disease (severe stomach pain)  . Tizanidine Anxiety    Depression  . Whey Other (See Comments)    REACTION: Celiac disease (severe stomach pain)  . Morphine And Related Other (See Comments)    REACTION: "REALLY BAD STOMACH PAIN"     Current Outpatient Prescriptions  Medication Sig Dispense Refill  . amiodarone (PACERONE) 200 MG tablet Take 1 tablet (200 mg total) by mouth 2 (two) times daily. 60 tablet 3  . apixaban (ELIQUIS) 5 MG TABS tablet Take 1 tablet (5 mg total) by mouth 2 (two) times daily. 60 tablet 3  . atorvastatin (LIPITOR) 80 MG tablet Take 1 tablet (80 mg total) by mouth daily at 6 PM. 30 tablet 6  . isosorbide mononitrate (IMDUR) 60 MG 24 hr tablet Take 1 tablet (60 mg total) by mouth daily. 90 tablet 1  . LORazepam (ATIVAN) 0.5 MG tablet Take 0.5 mg by mouth daily as needed for anxiety.    Marland Kitchen losartan (COZAAR) 100 MG tablet Take 1 tablet (100 mg total) by mouth daily. 30 tablet 2  . metoprolol (LOPRESSOR) 100 MG tablet Take 1 tablet (100 mg total) by mouth 2 (two) times daily. 180 tablet 3  . Multiple Vitamin (MULTIVITAMIN WITH MINERALS) TABS tablet Take 1 tablet by mouth daily.    . niacin 100 MG tablet Take 100 mg by mouth 2 (two) times daily with a meal.    . nitroGLYCERIN (NITROSTAT) 0.3 MG SL tablet Place 1 tablet (0.3 mg total) under the tongue every 5 (five) minutes as needed for chest pain. 90 tablet 6  . Omega-3 Fatty Acids (FISH OIL) 1000 MG CPDR Take 2,000 mg by  mouth 2 (two) times daily.     Marland Kitchen PRESCRIPTION MEDICATION Inhale into the lungs at bedtime. CPAP    . spironolactone (ALDACTONE) 25 MG tablet Take 1 tablet (25 mg total) by mouth daily. 90 tablet 3  . TURMERIC PO Take 2,000 mg by mouth 2 (two) times daily. 1 capsule - 1000 mg     No current facility-administered medications for this visit.      Past Medical History:  Diagnosis Date  . Arthritis    "knees" (01/02/2017)  . CAD (coronary artery disease)    a. s/p PCI in 2010 in [place]. b. Unstable angina/LHC 12/20/4763 complicated by V fib arrest after contrast injection. Cath 06/19/2015 s/p DES to mid LAD and RPDA.  Marland Kitchen Dyslipidemia   . Essential hypertension   . Lung nodule < 6cm on CT 06/16/15   Right lung  . Mitral regurgitation    a. Mild-mod by echo 06/2015.  . OSA on CPAP   . PAF (paroxysmal atrial fibrillation) ([place])    a. 06/2015 noted to be in new a-fib when arrived with unstable angina. Converted to NSR after being shocked in the cath lab for vfib;  b. 06/2015 Eliquis initiated as PAF noted on event monitor.  . Pulmonary nodule 07/2015   a. 1.5 x 1.2 cm smoothly  marginated nodule in the central aspect of the right lower lobe with recommendation correlation with nonemergent PET-CT to exclude a neoplasm , stable by CT 07/2016  . Ventricular fibrillation ([place])    a. occured during cath 06/18/2015.    ROS:   All systems reviewed and negative except as noted in the HPI.   Past Surgical History:  Procedure Laterality Date  . CARDIAC CATHETERIZATION N/A 06/18/2015   Procedure: Left Heart Cath and Coronary Angiography;  Surgeon: Burnell Blanks, MD;  Location: [facility];  Service: Cardiovascular;  Laterality: N/A;  . CARDIAC CATHETERIZATION N/A 06/19/2015   Procedure: Coronary Stent Intervention;  Surgeon: Peter M Martinique, MD;  Location: [facility];  Service: Cardiovascular;  Laterality: N/A;  . CARDIAC CATHETERIZATION N/A 11/13/2016   Procedure: Left Heart Cath and  Coronary Angiography;  Surgeon: Lorretta Harp, MD;  Location: [facility];  Service: Cardiovascular;  Laterality: N/A;  . CARDIOVERSION N/A 11/17/2016   Procedure: CARDIOVERSION;  Surgeon: Larey Dresser, MD;  Location: [facility];  Service: Cardiovascular;  Laterality: N/A;  . CARDIOVERSION N/A 01/03/2017   Procedure: CARDIOVERSION;  Surgeon: Lelon Perla, MD;  Location: [facility];  Service: Cardiovascular;  Laterality: N/A;  . CARTILAGE SURGERY Bilateral    "thumbs"  . CATARACT EXTRACTION W/ INTRAOCULAR LENS  IMPLANT, BILATERAL Bilateral   . CORONARY ANGIOPLASTY WITH STENT PLACEMENT  ~ 2007  . JOINT REPLACEMENT    . KNEE ARTHROSCOPY Right   . RETINAL DETACHMENT SURGERY Bilateral    "laser thing"  . TEE WITHOUT CARDIOVERSION N/A 11/17/2016   Procedure: TRANSESOPHAGEAL ECHOCARDIOGRAM (TEE);  Surgeon: Larey Dresser, MD;  Location: [facility];  Service: Cardiovascular;  Laterality: N/A;  . TOTAL KNEE ARTHROPLASTY Left 2000s     Family History  Problem Relation Age of Onset  . Stroke Mother   . Emphysema Mother   . Lung cancer Mother   . Heart attack Father   . Drug abuse Father   . Heart attack Maternal Grandfather   . Heart attack Paternal Grandfather   . Diabetes Sister      Social History   Social History  . Marital status: Divorced    Spouse name: N/A  . Number of children: N/A  . Years of education: N/A   Occupational History  . Not on file.   Social History Main Topics  . Smoking status: Never Smoker  . Smokeless tobacco: Never Used     Comment: Second-hand exposure through parents  . Alcohol use 1.2 oz/week    2 Glasses of wine per week  . Drug use: No  . Sexual activity: Yes   Other Topics Concern  . Not on file   Social History Narrative   Originally he is from [place], [place]. He moved to [facility] in 2013. He has a dog & cat at home. No bird or mold exposure. Has a hot tub but hasn't used it in 4 months. 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      BP  126/90   Pulse 75   Ht 5\' 11"  (1.803 m)   Wt 236 lb (107 kg)   SpO2 96%   BMI 32.92 kg/m   Physical Exam:  Well appearing 66 yo man, NAD HEENT: Unremarkable Neck:  7 cm JVD, no thyromegally Lymphatics:  No adenopathy Back:  No CVA tenderness Lungs:  Clear with no wheezes HEART:  Regular rate rhythm, no murmurs, no rubs, no clicks Abd:  soft, positive bowel sounds,  no organomegally, no rebound, no guarding Ext:  2 plus pulses, no edema, no cyanosis, no clubbing Skin:  No rashes no nodules Neuro:  CN II through XII intact, motor grossly intact  EKG - atrial fib with a controlled VR.   Assess/Plan: 1. Persistent atrial fib - he is doing well as his rate is under better control. He has been anti-coagulated and has been on amio for over 3 weeks. He will come in next week for DCCV. 2. HTN - his blood pressure today is controlled. Will follow.  3. CAD - he denies anginal symptoms. Will follow. 4. Dyslipidemia - he will continue his statin therapy.  Mikle Bosworth.D.

## 2017-02-05 NOTE — Interval H&P Note (Signed)
History and Physical Interval Note:  02/05/2017 10:05 AM  Troy [name]  has presented today for surgery, with the diagnosis of afib  The various methods of treatment have been discussed with the patient and family. After consideration of risks, benefits and other options for treatment, the patient has consented to  Procedure(s): Cardioversion (N/A) as a surgical intervention .  The patient's history has been reviewed, patient examined, no change in status, stable for surgery.  I have reviewed the patient's chart and labs.  Questions were answered to the patient's satisfaction.     Cristopher Peru

## 2017-02-05 NOTE — Discharge Instructions (Signed)
Electrical Cardioversion, Care After °This sheet gives you information about how to care for yourself after your procedure. Your health care provider may also give you more specific instructions. If you have problems or questions, contact your health care provider. °What can I expect after the procedure? °After the procedure, it is common to have: °· Some redness on the skin where the shocks were given. °Follow these instructions at home: °· Do not drive for 24 hours if you were given a medicine to help you relax (sedative). °· Take over-the-counter and prescription medicines only as told by your health care provider. °· Ask your health care provider how to check your pulse. Check it often. °· Rest for 48 hours after the procedure or as told by your health care provider. °· Avoid or limit your caffeine use as told by your health care provider. °Contact a health care provider if: °· You feel like your heart is beating too quickly or your pulse is not regular. °· You have a serious muscle cramp that does not go away. °Get help right away if: °· You have discomfort in your chest. °· You are dizzy or you feel faint. °· You have trouble breathing or you are short of breath. °· Your speech is slurred. °· You have trouble moving an arm or leg on one side of your body. °· Your fingers or toes turn cold or blue. °This information is not intended to replace advice given to you by your health care provider. Make sure you discuss any questions you have with your health care provider. °Document Released: 08/17/2013 Document Revised: 05/30/2016 Document Reviewed: 05/02/2016 °Elsevier Interactive Patient Education © 2017 [employer] ° °

## 2017-02-10 ENCOUNTER — Telehealth: Payer: Self-pay | Admitting: Internal Medicine

## 2017-02-10 NOTE — Telephone Encounter (Signed)
Pt c/o Shortness Of Breath: STAT if SOB developed within the last 24 hours or pt is noticeably SOB on the phone  1. Are you currently SOB (can you hear that pt is SOB on the phone)? no  2. How long have you been experiencing SOB? For about a week or so  3. Are you SOB when sitting or when up moving around?= walking   4. Are you currently experiencing any other symptoms?   Patient had cardioversion and states that he is now out of breath and would like to discuss if this is normal.

## 2017-02-10 NOTE — Telephone Encounter (Signed)
Pt had cardioversion for A-fib on 3/29. Pt  Feels that he gets  out of breath easily with mild activity the same as   prior the cardioversion . Pt  does not feels the breathing has improved. Pt said the he has a machine that show that he is in normal rhythm. Pt   wants to know if it is normal and if it the medication the is doing it.pt's heart rate stays in the 60's sometime goes below 60 beats/ minute. Doctor [name] DOD aware of pt's symptoms and recommends for pt to be seen today in the office or he may go to the ER if he is severely SOB. Or at least pt needs an EKG done. Pt is aware of recommendations. Pt states he is not that SOB to go to the ER. He would like to be seen in the office tomorrow if we have an opening. An appointment was made for pt with Truitt Merle tomorrow at 9:30 AM. Pt is aware to go to the ER if breathing gets worse. Pt verbalized understanding.

## 2017-02-10 NOTE — Progress Notes (Signed)
CARDIOLOGY OFFICE NOTE  Date:  02/11/2017    Troy [name] Date of Birth: 1951-03-09 Medical Record #[id number]  PCP:  Velna Hatchet, MD  Cardiologist:  Turner/Taylor   Chief Complaint  Patient presents with  . Atrial Fibrillation    History of Present Illness: Troy [name] is a 66 y.o. male who presents today for a work in visit. Seen for Dr. Radford Pax and Dr. [name].   He has a history of persistent atrial fib who had ERAF after prior DCCV. He was placed on amiodarone. His other issues include a history of HTN, HLD, CAD s/p PCI (2010), DES to mLAD and RPDA (06/2015) c/b VF arrest, OSA complaint with CPAP.   He was admitted in 11/2016 for chest pain. He was found to be in afib with RVR. He underwent TEE/DCCV as well as a nuclear stress test that was high risk (for low EF 33%). He underwent heart cath this same admission which showed patent stents and stable non obstructive CAD. Of note, follow up echo 11/18/16 showed normal LVEF 60-65%, severe concentric hypertrophy, mod LAE and severe RAE. Subsequently had ablation and cardioversion.   Just seen about 2 weeks ago with Dr. [name] - he was feeling better.   Phone call earlier this week - "Pt had cardioversion for A-fib on 3/29. Pt  Feels that he gets  out of breath easily with mild activity the same as prior the cardioversion . Pt  does not feels the breathing has improved. Pt said the he has a machine that show that he is in normal rhythm. Pt   wants to know if it is normal and if it the medication the is doing it.pt's heart rate stays in the 60's sometime goes below 60 beats/ minute. Doctor [name] DOD aware of pt's symptoms and recommends for pt to be seen today in the office or he may go to the ER if he is severely SOB. Or at least pt needs an EKG done. Pt is aware of recommendations. Pt states he is not that SOB to go to the ER. He would like to be seen in the office tomorrow if we have an opening. An appointment was made for pt  with Truitt Merle tomorrow at 9:30 AM. Pt is aware to go to the ER if breathing gets worse. Pt verbalized understanding"  Thus added to my schedule for today.   Comes in today. Here alone. He is here today for shortness of breath with exertion. Feels very "unstable" since the cardioversion as well. He feels like he will stumble - and he has fallen - a few days ago while trying to get off the cough - landed on his knees. He feels like he has been in NSR. EKG today with NSR. May feel a little short of breath with just sitting. Says he "has to be conscious of taking a deep breath". May have had some of this just prior to the cardioversion. He notes his insurance will no longer pay for Eliquis - he would like to change to Xarelto. Not aware that if he were to fall and hit his head that he is at increased risk for bleeding and would need a CT scan. Typically BP is lower at home but he has misplaced his BP cuff and needs to get a new one.   Past Medical History:  Diagnosis Date  . Arthritis    "knees" (01/02/2017)  . CAD (coronary artery disease)    a. s/p PCI in  2010 in [place]. b. Unstable angina/LHC 11/11/8784 complicated by V fib arrest after contrast injection. Cath 06/19/2015 s/p DES to mid LAD and RPDA.  Marland Kitchen Dyslipidemia   . Essential hypertension   . Lung nodule < 6cm on CT 06/16/15   Right lung  . Mitral regurgitation    a. Mild-mod by echo 06/2015.  . OSA on CPAP   . PAF (paroxysmal atrial fibrillation) ([place])    a. 06/2015 noted to be in new a-fib when arrived with unstable angina. Converted to NSR after being shocked in the cath lab for vfib;  b. 06/2015 Eliquis initiated as PAF noted on event monitor.  . Pulmonary nodule 07/2015   a. 1.5 x 1.2 cm smoothly marginated nodule in the central aspect of the right lower lobe with recommendation correlation with nonemergent PET-CT to exclude a neoplasm , stable by CT 07/2016  . Ventricular fibrillation ([place])    a. occured during cath 06/18/2015.     Past Surgical History:  Procedure Laterality Date  . CARDIAC CATHETERIZATION N/A 06/18/2015   Procedure: Left Heart Cath and Coronary Angiography;  Surgeon: Burnell Blanks, MD;  Location: [facility];  Service: Cardiovascular;  Laterality: N/A;  . CARDIAC CATHETERIZATION N/A 06/19/2015   Procedure: Coronary Stent Intervention;  Surgeon: Peter M Martinique, MD;  Location: [facility];  Service: Cardiovascular;  Laterality: N/A;  . CARDIAC CATHETERIZATION N/A 11/13/2016   Procedure: Left Heart Cath and Coronary Angiography;  Surgeon: Lorretta Harp, MD;  Location: [facility];  Service: Cardiovascular;  Laterality: N/A;  . CARDIOVERSION N/A 11/17/2016   Procedure: CARDIOVERSION;  Surgeon: Larey Dresser, MD;  Location: [facility];  Service: Cardiovascular;  Laterality: N/A;  . CARDIOVERSION N/A 01/03/2017   Procedure: CARDIOVERSION;  Surgeon: Lelon Perla, MD;  Location: [facility];  Service: Cardiovascular;  Laterality: N/A;  . CARDIOVERSION N/A 02/05/2017   Procedure: Cardioversion;  Surgeon: Evans Lance, MD;  Location: [facility];  Service: Cardiovascular;  Laterality: N/A;  . CARTILAGE SURGERY Bilateral    "thumbs"  . CATARACT EXTRACTION W/ INTRAOCULAR LENS  IMPLANT, BILATERAL Bilateral   . CORONARY ANGIOPLASTY WITH STENT PLACEMENT  ~ 2007  . JOINT REPLACEMENT    . KNEE ARTHROSCOPY Right   . RETINAL DETACHMENT SURGERY Bilateral    "laser thing"  . TEE WITHOUT CARDIOVERSION N/A 11/17/2016   Procedure: TRANSESOPHAGEAL ECHOCARDIOGRAM (TEE);  Surgeon: Larey Dresser, MD;  Location: [facility];  Service: Cardiovascular;  Laterality: N/A;  . TOTAL KNEE ARTHROPLASTY Left 2000s     Medications: Current Outpatient Prescriptions  Medication Sig Dispense Refill  . amiodarone (PACERONE) 200 MG tablet Take 1 tablet (200 mg total) by mouth daily. 60 tablet 3  . apixaban (ELIQUIS) 5 MG TABS tablet Take 1 tablet (5 mg total) by mouth 2 (two) times daily. 60  tablet 3  . atorvastatin (LIPITOR) 80 MG tablet Take 1 tablet (80 mg total) by mouth daily at 6 PM. 30 tablet 6  . isosorbide mononitrate (IMDUR) 60 MG 24 hr tablet Take 1 tablet (60 mg total) by mouth daily. 90 tablet 1  . LORazepam (ATIVAN) 0.5 MG tablet Take 0.5 mg by mouth daily as needed for anxiety.    Marland Kitchen losartan (COZAAR) 100 MG tablet Take 1 tablet (100 mg total) by mouth daily. 30 tablet 2  . metoprolol (LOPRESSOR) 100 MG tablet Take 1 tablet (100 mg total) by mouth 2 (two) times daily. 180 tablet 3  . Multiple Vitamin (  MULTIVITAMIN WITH MINERALS) TABS tablet Take 1 tablet by mouth daily.    . niacin 100 MG tablet Take 100 mg by mouth 2 (two) times daily with a meal.    . nitroGLYCERIN (NITROSTAT) 0.3 MG SL tablet Place 1 tablet (0.3 mg total) under the tongue every 5 (five) minutes as needed for chest pain. 90 tablet 6  . Omega-3 Fatty Acids (FISH OIL) 1000 MG CPDR Take 2,000 mg by mouth 2 (two) times daily.     Marland Kitchen PRESCRIPTION MEDICATION Inhale into the lungs at bedtime. CPAP    . spironolactone (ALDACTONE) 25 MG tablet Take 1 tablet (25 mg total) by mouth daily. 90 tablet 3  . TURMERIC PO Take 2,000 mg by mouth 2 (two) times daily. 1 capsule - 1000 mg    . rivaroxaban (XARELTO) 20 MG TABS tablet Take 1 tablet (20 mg total) by mouth daily with supper. 30 tablet 6   No current facility-administered medications for this visit.     Allergies: Allergies  Allergen Reactions  . Contrast Media [Iodinated Diagnostic Agents] Other (See Comments)    V-Fib arrest during cardiac cath suspected d/t contrast dye in 2016  . Gluten Meal Other (See Comments)    REACTION: Celiac disease (severe stomach pain)  . Tizanidine Anxiety    Depression  . Whey Other (See Comments)    REACTION: Celiac disease (severe stomach pain)  . Morphine And Related Other (See Comments)    REACTION: "REALLY BAD STOMACH PAIN"    Social History: The patient  reports that he has never smoked. He has never used  smokeless tobacco. He reports that he drinks about 1.2 oz of alcohol per week . He reports that he does not use drugs.   Family History: The patient's family history includes Diabetes in his sister; Drug abuse in his father; Emphysema in his mother; Heart attack in his father, maternal grandfather, and paternal grandfather; Lung cancer in his mother; Stroke in his mother.   Review of Systems: Please see the history of present illness.   Otherwise, the review of systems is positive for none.   All other systems are reviewed and negative.   Physical Exam: VS:  BP (!) 160/90   Pulse (!) 51   Ht 5\' 11"  (1.803 m)   Wt 238 lb (108 kg)   SpO2 94% Comment: resting/walking 97  BMI 33.19 kg/m  .  BMI Body mass index is 33.19 kg/m.  Wt Readings from Last 3 Encounters:  02/11/17 238 lb (108 kg)  02/05/17 240 lb (108.9 kg)  01/27/17 236 lb (107 kg)   BP by me is 120/80  General: Pleasant. Well developed, well nourished and in no acute distress.  May be a bit anxious.  HEENT: Normal.  Neck: Supple, no JVD, carotid bruits, or masses noted.  Cardiac: Regular rate and rhythm. No murmurs, rubs, or gallops. No edema.  Respiratory:  Lungs are clear to auscultation bilaterally with normal work of breathing.  GI: Soft and nontender.  MS: No deformity or atrophy. Gait and ROM intact.  Skin: Warm and dry. Color is normal.  Neuro:  Strength and sensation are intact and no gross focal deficits noted.  Psych: Alert, appropriate and with normal affect.   LABORATORY DATA:  EKG:  EKG is ordered today. This demonstrates sinus bradycardia. HR is 51. QT of 518 ms  Lab Results  Component Value Date   WBC 6.8 01/30/2017   HGB 15.2 11/17/2016   HCT 44.3 01/30/2017  PLT 162 01/30/2017   GLUCOSE 95 01/30/2017   CHOL 94 (L) 08/15/2015   TRIG 74 08/15/2015   HDL 37 (L) 08/15/2015   LDLCALC 42 08/15/2015   ALT 27 11/11/2016   AST 19 11/11/2016   NA 140 01/30/2017   K 4.4 01/30/2017   CL 100  01/30/2017   CREATININE 1.14 01/30/2017   BUN 19 01/30/2017   CO2 23 01/30/2017   TSH 1.090 11/10/2016   INR 1.13 11/13/2016    BNP (last 3 results) No results for input(s): BNP in the last 8760 hours.  ProBNP (last 3 results) No results for input(s): PROBNP in the last 8760 hours.   Other Studies Reviewed Today:  Procedures 02/05/2017   Cardioversion  Conclusion   Conclusion: Successful cardioversion of a patient with persistent atrial fibrillation with 360 J of synchronized biphasic energy.  Cristopher Peru, M.D.   Echo Study Conclusions from 11/2016  - Left ventricle: The cavity size was normal. There was severe   concentric hypertrophy. Systolic function was normal. The   estimated ejection fraction was in the range of 60% to 65%. Wall   motion was normal; there were no regional wall motion   abnormalities. The study is not technically sufficient to allow   evaluation of LV diastolic function. - Aortic valve: Trileaflet; mildly calcified leaflets. There was   mild regurgitation. - Mitral valve: Mildly thickened leaflets . There was trivial   regurgitation. - Left atrium: Moderately dilated. - Right ventricle: The cavity size was mildly dilated. - Right atrium: Severely dilated. - Tricuspid valve: There was trivial regurgitation. - Pulmonary arteries: PA peak pressure: 20 mm Hg (S). - Inferior vena cava: The vessel was normal in size. The   respirophasic diameter changes were in the normal range (= 50%),   consistent with normal central venous pressure.  Impressions:  - Compared to a prior study in 06/2015, the LVEF has improved to   60-65%. There is moderate LAE and severe RAE.  Procedures 11/2016  Left Heart Cath and Coronary Angiography  Conclusion     Ost RPDA to RPDA lesion, 0 %stenosed.  Prox Cx to Mid Cx lesion, 0 %stenosed.  Prox LAD to Mid LAD lesion, 0 %stenosed.  Ost 1st Diag lesion, 80 %stenosed.  Mid RCA lesion, 60 %stenosed.        Assessment/Plan: 1. Dyspnea and feeling of "instability - cutting amiodarone back to just one pill a day. Lab today. Arrange for PFTs and CXR as well. May not be able to tolerate his regimen of amiodarone - he has follow up with Dr. [name] that he needs to keep.   2. Persistent atrial fib - remains in NSR. Cutting amiodarone back to just one pill a day.   3. HTN - repeat BP by me is ok. I have asked him to monitor.  4. CAD - he denies anginal symptoms. Will follow.  5. Dyslipidemia - he will continue his statin therapy. Probably does not need niacin based on recent research - will defer to Dr. Radford Pax.   6. Chronic anticoagulation - switching to Xarelto 20 mg a day - advised to take with his largest meal of the day. Reminded about bleeding precautions to take.   Current medicines are reviewed with the patient today.  The patient does not have concerns regarding medicines other than what has been noted above.  The following changes have been made:  See above.  Labs/ tests ordered today include:    Orders Placed This Encounter  Procedures  . DG Chest 2 View  . Basic metabolic panel  . CBC  . Pro b natriuretic peptide (BNP)  . TSH  . Hepatic function panel  . EKG 12-Lead  . Pulmonary function test     Disposition:   FU with Dr. [name] as planned.    Patient is agreeable to this plan and will call if any problems develop in the interim.   SignedTruitt Merle, NP  02/11/2017 10:00 AM  [facility] [address] [place], [place]  [address] Phone: [phone] Fax: [phone]

## 2017-02-11 ENCOUNTER — Ambulatory Visit
Admission: RE | Admit: 2017-02-11 | Discharge: 2017-02-11 | Disposition: A | Discharge status: Home / Self Care | Payer: Medicare Other | Source: Ambulatory Visit | Attending: Nurse Practitioner | Admitting: Nurse Practitioner

## 2017-02-11 ENCOUNTER — Encounter: Payer: Self-pay | Admitting: Nurse Practitioner

## 2017-02-11 ENCOUNTER — Ambulatory Visit (INDEPENDENT_AMBULATORY_CARE_PROVIDER_SITE_OTHER): Payer: Medicare Other | Admitting: Nurse Practitioner

## 2017-02-11 ENCOUNTER — Emergency Department (HOSPITAL_COMMUNITY): Payer: Medicare Other

## 2017-02-11 ENCOUNTER — Encounter (HOSPITAL_COMMUNITY): Payer: Self-pay

## 2017-02-11 ENCOUNTER — Emergency Department (HOSPITAL_COMMUNITY)
Admission: EM | Admit: 2017-02-11 | Discharge: 2017-02-12 | Disposition: A | Discharge status: Home / Self Care | Payer: Medicare Other | Attending: Emergency Medicine | Admitting: Emergency Medicine

## 2017-02-11 VITALS — BP 160/90 | HR 51 | Ht 71.0 in | Wt 238.0 lb

## 2017-02-11 DIAGNOSIS — R0602 Shortness of breath: Secondary | ICD-10-CM

## 2017-02-11 DIAGNOSIS — Z79899 Other long term (current) drug therapy: Secondary | ICD-10-CM

## 2017-02-11 DIAGNOSIS — I2510 Atherosclerotic heart disease of native coronary artery without angina pectoris: Secondary | ICD-10-CM | POA: Diagnosis not present

## 2017-02-11 DIAGNOSIS — Z96652 Presence of left artificial knee joint: Secondary | ICD-10-CM | POA: Insufficient documentation

## 2017-02-11 DIAGNOSIS — I10 Essential (primary) hypertension: Secondary | ICD-10-CM | POA: Diagnosis not present

## 2017-02-11 DIAGNOSIS — I200 Unstable angina: Secondary | ICD-10-CM

## 2017-02-11 DIAGNOSIS — I480 Paroxysmal atrial fibrillation: Secondary | ICD-10-CM | POA: Diagnosis not present

## 2017-02-11 LAB — I-STAT TROPONIN, ED: TROPONIN I, POC: 0.01 ng/mL (ref 0.00–0.08)

## 2017-02-11 LAB — BASIC METABOLIC PANEL
ANION GAP: 12 (ref 5–15)
BUN: 31 mg/dL — AB (ref 6–20)
CALCIUM: 9.4 mg/dL (ref 8.9–10.3)
CO2: 21 mmol/L — ABNORMAL LOW (ref 22–32)
CREATININE: 1.19 mg/dL (ref 0.61–1.24)
Chloride: 102 mmol/L (ref 101–111)
GFR calc non Af Amer: >60 mL/min (ref >60)
Glucose, Bld: 97 mg/dL (ref 65–99)
Potassium: 3.8 mmol/L (ref 3.5–5.1)
Sodium: 135 mmol/L (ref 135–145)

## 2017-02-11 LAB — CBC
HCT: 45.5 % (ref 39.0–52.0)
Hemoglobin: 15.2 g/dL (ref 13.0–17.0)
MCH: 29.6 pg (ref 26.0–34.0)
MCHC: 33.4 g/dL (ref 30.0–36.0)
MCV: 88.5 fL (ref 78.0–100.0)
PLATELETS: 168 10*3/uL (ref 150–400)
RBC: 5.14 MIL/uL (ref 4.22–5.81)
RDW: 13.5 % (ref 11.5–15.5)
WBC: 7.6 10*3/uL (ref 4.0–10.5)

## 2017-02-11 LAB — PROTIME-INR
INR: 1.13
Prothrombin Time: 14.5 seconds (ref 11.4–15.2)

## 2017-02-11 IMAGING — DX DG CHEST 2V
2 series · 2 of 2 positions shown · non-contrast
Comparison: [DATE].

CLINICAL DATA: Shortness of breath .

EXAM:
CHEST  2 VIEW

[dg chest 2 view (1 of 2)]
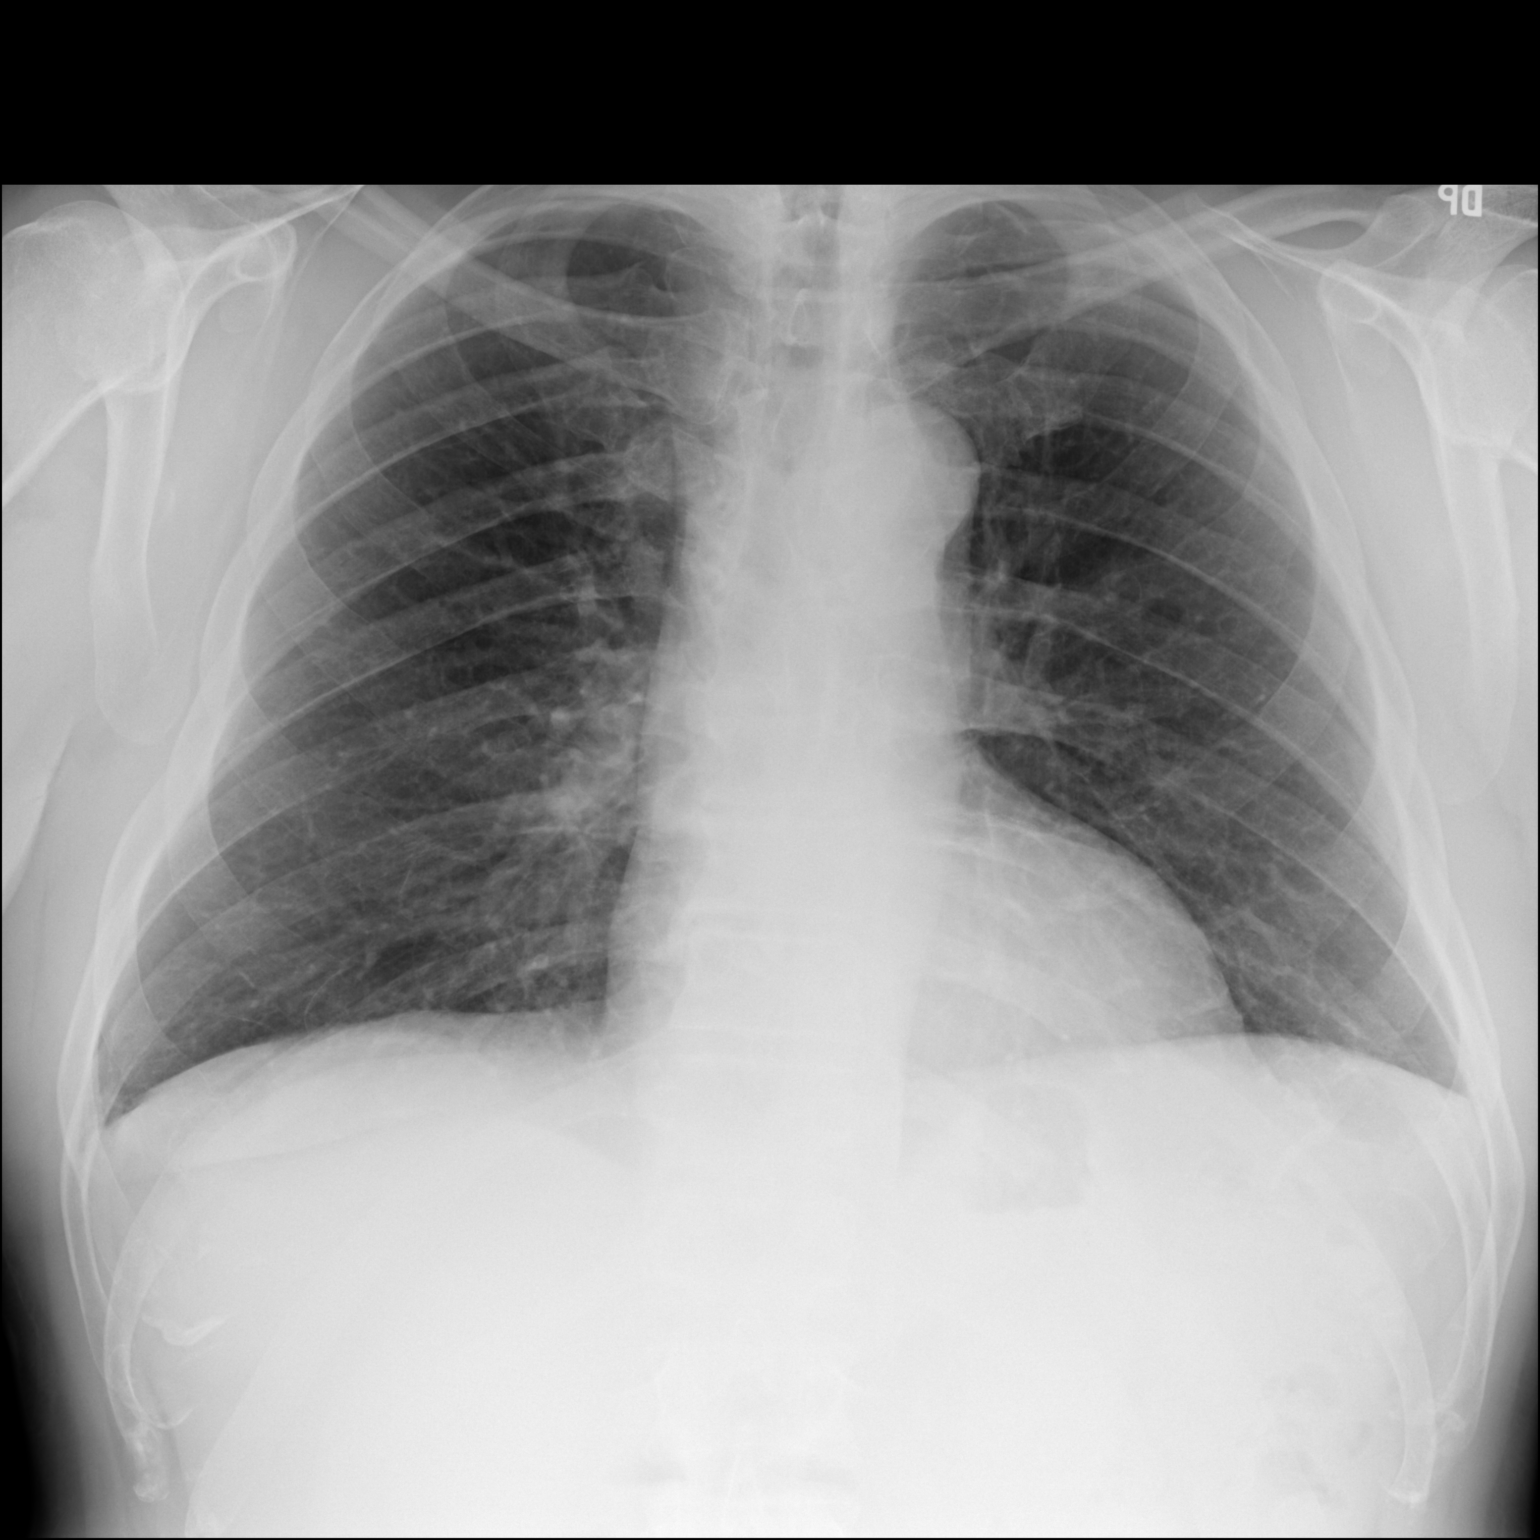

[dg chest 2 view (2 of 2)]
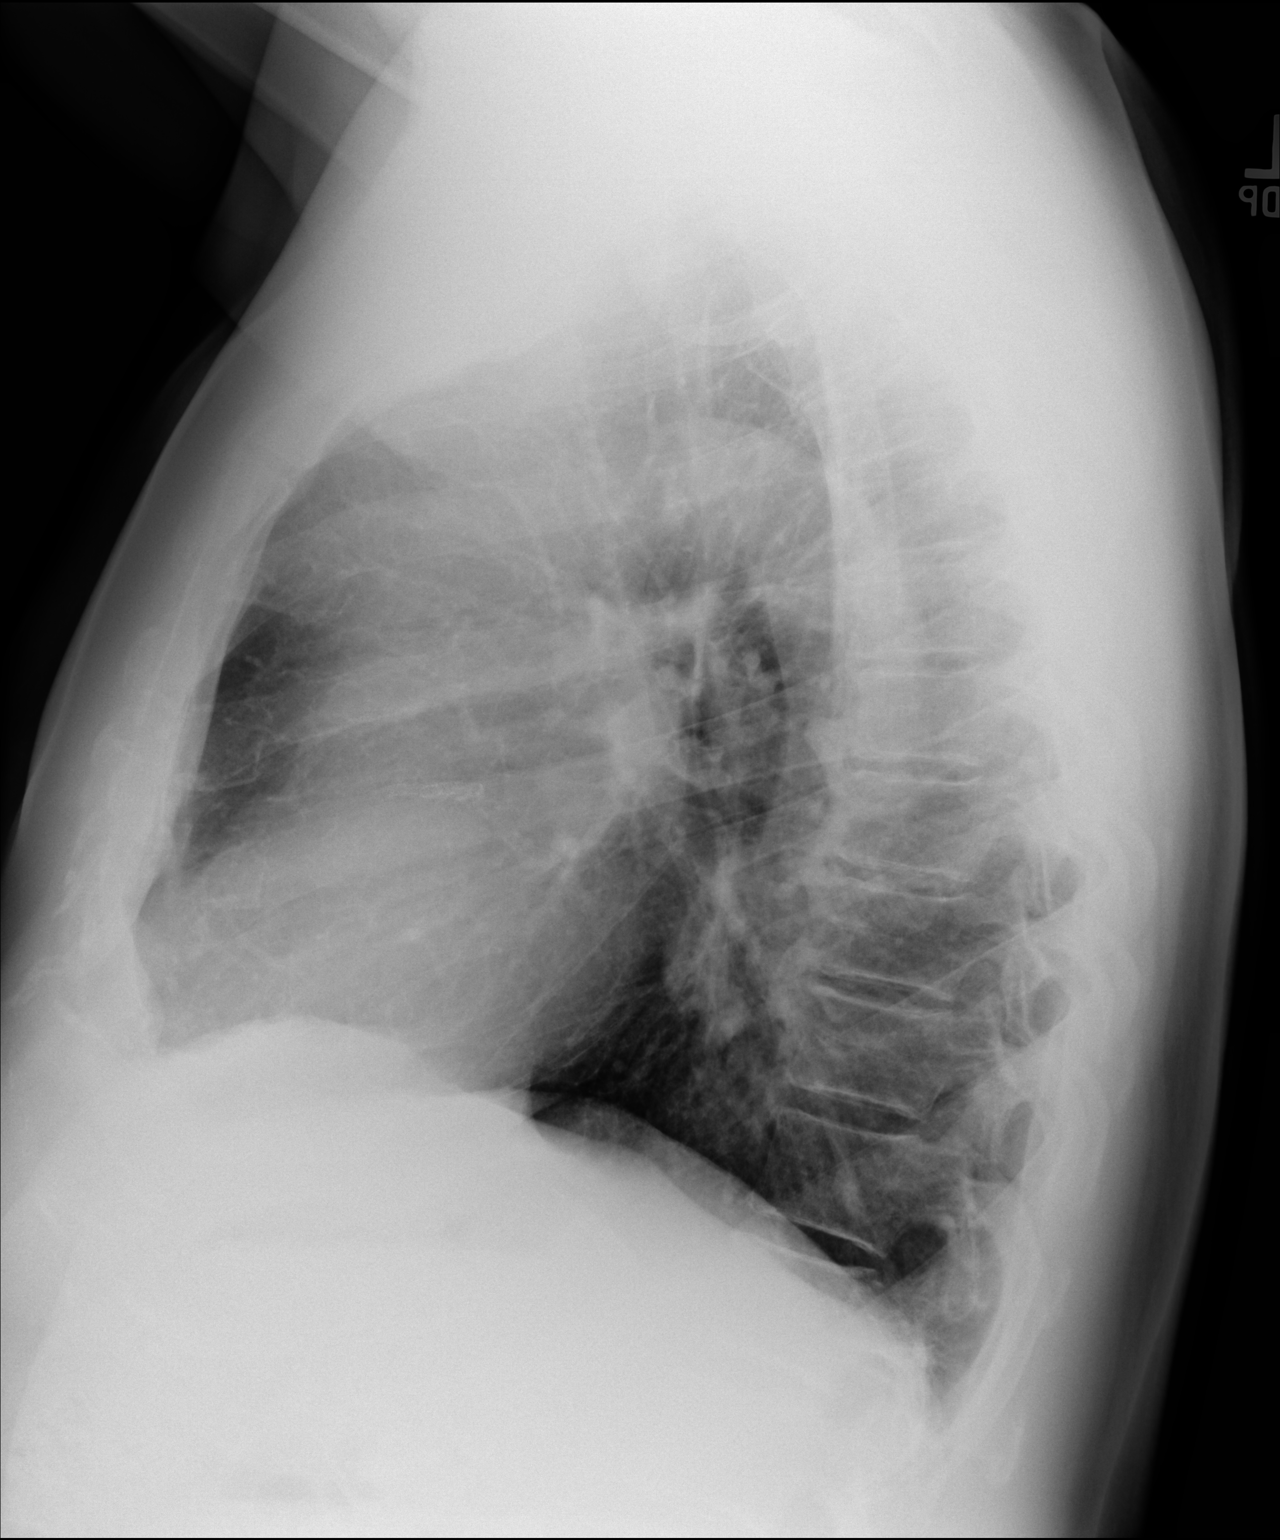

[2 of 2 positions shown; findings below may reference images not displayed]

FINDINGS: Mediastinum and hilar structures normal. No focal infiltrate. No
pleural effusion or pneumothorax. Heart size normal. No acute bony
abnormality.
IMPRESSION: No acute cardiopulmonary disease .

## 2017-02-11 MED ORDER — AMIODARONE HCL 200 MG PO TABS: 200.0000 mg | ORAL_TABLET | Freq: Every day | ORAL | 3 refills | Status: DC

## 2017-02-11 MED ORDER — RIVAROXABAN 20 MG PO TABS: 20.0000 mg | ORAL_TABLET | Freq: Every day | ORAL | 6 refills | Status: DC

## 2017-02-11 NOTE — ED Provider Notes (Signed)
[facility] DEPT Provider Note   CSN: [id number] Arrival date & time: 02/11/17  2022     History   Chief Complaint Chief Complaint  Patient presents with  . Shortness of Breath  . Chest Pain  . Atrial Fibrillation    HPI Troy [name] is a 66 y.o. male.  Patient with a history of CAD (V-fib arrest 2016), last catheterization 11/2016, HTN, HLD presents with complaint of SOB. He underwent successful cardioversion 6 days ago Troy [name]) and has had SOB since. Today he was seen in the cardiology clinic with Dr. Radford Pax and was found to be bradycardic felt to be the cause of symptoms and his Amiodarone was decreased. Later in the afternoon he reports he went back into atrial fibrillation, became lightheaded, nauseous and diaphoretic, prompting ED visit for further evaluation. He currently feels back to baseline with the exception of continued SOB as it was previously. No chest pain, cough, fever.    The history is provided by the patient. No language interpreter was used.  Shortness of Breath  Pertinent negatives include no fever, no cough, no chest pain, no vomiting and no abdominal pain.  Chest Pain   Associated symptoms include diaphoresis, nausea and shortness of breath. Pertinent negatives include no abdominal pain, no cough, no fever and no vomiting.  Atrial Fibrillation  Associated symptoms include shortness of breath. Pertinent negatives include no chest pain and no abdominal pain.    Past Medical History:  Diagnosis Date  . Arthritis    "knees" (01/02/2017)  . CAD (coronary artery disease)    a. s/p PCI in 2010 in [place]. b. Unstable angina/LHC 12/15/537 complicated by V fib arrest after contrast injection. Cath 06/19/2015 s/p DES to mid LAD and RPDA.  Marland Kitchen Dyslipidemia   . Essential hypertension   . Lung nodule < 6cm on CT 06/16/15   Right lung  . Mitral regurgitation    a. Mild-mod by echo 06/2015.  . OSA on CPAP   . PAF (paroxysmal atrial fibrillation) ([place])    a. 06/2015  noted to be in new a-fib when arrived with unstable angina. Converted to NSR after being shocked in the cath lab for vfib;  b. 06/2015 Eliquis initiated as PAF noted on event monitor.  . Pulmonary nodule 07/2015   a. 1.5 x 1.2 cm smoothly marginated nodule in the central aspect of the right lower lobe with recommendation correlation with nonemergent PET-CT to exclude a neoplasm , stable by CT 07/2016  . Ventricular fibrillation ([place])    a. occured during cath 06/18/2015.    Patient Active Problem List   Diagnosis Date Noted  . Atrial fibrillation ([place]) 01/02/2017  . Unstable angina pectoris ([place])   . Atrial fibrillation with rapid ventricular response ([place])   . Chest pain   . Chest pain with moderate risk of acute coronary syndrome 11/10/2016  . Environmental and seasonal allergies 07/06/2015  . Pulmonary nodule 06/27/2015  . Mitral regurgitation   . Ventricular fibrillation ([place])   . Dyslipidemia 06/18/2015  . CAD S/P percutaneous coronary angioplasty 06/17/2015  . Persistent atrial fibrillation with RVR ([place]), successful TEE DCCV 11/17/16 06/16/2015  . Essential (primary) hypertension 04/11/2015  . Abnormal presence of protein in urine 04/11/2015    Past Surgical History:  Procedure Laterality Date  . CARDIAC CATHETERIZATION N/A 06/18/2015   Procedure: Left Heart Cath and Coronary Angiography;  Surgeon: Burnell Blanks, MD;  Location: [facility];  Service: Cardiovascular;  Laterality: N/A;  . CARDIAC CATHETERIZATION N/A 06/19/2015  Procedure: Coronary Stent Intervention;  Surgeon: Peter M Martinique, MD;  Location: [facility];  Service: Cardiovascular;  Laterality: N/A;  . CARDIAC CATHETERIZATION N/A 11/13/2016   Procedure: Left Heart Cath and Coronary Angiography;  Surgeon: Lorretta Harp, MD;  Location: [facility];  Service: Cardiovascular;  Laterality: N/A;  . CARDIOVERSION N/A 11/17/2016   Procedure: CARDIOVERSION;  Surgeon: Larey Dresser, MD;  Location: [facility]  ENDOSCOPY;  Service: Cardiovascular;  Laterality: N/A;  . CARDIOVERSION N/A 01/03/2017   Procedure: CARDIOVERSION;  Surgeon: Lelon Perla, MD;  Location: [facility];  Service: Cardiovascular;  Laterality: N/A;  . CARDIOVERSION N/A 02/05/2017   Procedure: Cardioversion;  Surgeon: Evans Lance, MD;  Location: [facility];  Service: Cardiovascular;  Laterality: N/A;  . CARTILAGE SURGERY Bilateral    "thumbs"  . CATARACT EXTRACTION W/ INTRAOCULAR LENS  IMPLANT, BILATERAL Bilateral   . CORONARY ANGIOPLASTY WITH STENT PLACEMENT  ~ 2007  . JOINT REPLACEMENT    . KNEE ARTHROSCOPY Right   . RETINAL DETACHMENT SURGERY Bilateral    "laser thing"  . TEE WITHOUT CARDIOVERSION N/A 11/17/2016   Procedure: TRANSESOPHAGEAL ECHOCARDIOGRAM (TEE);  Surgeon: Larey Dresser, MD;  Location: [facility];  Service: Cardiovascular;  Laterality: N/A;  . TOTAL KNEE ARTHROPLASTY Left 2000s       Home Medications    Prior to Admission medications   Medication Sig Start Date End Date Taking? Authorizing Provider  amiodarone (PACERONE) 200 MG tablet Take 1 tablet (200 mg total) by mouth daily. 02/11/17   Burtis Junes, NP  atorvastatin (LIPITOR) 80 MG tablet Take 1 tablet (80 mg total) by mouth daily at 6 PM. 11/17/16   Isaiah Serge, NP  isosorbide mononitrate (IMDUR) 60 MG 24 hr tablet Take 1 tablet (60 mg total) by mouth daily. 01/14/17   Eileen Stanford, PA-C  LORazepam (ATIVAN) 0.5 MG tablet Take 0.5 mg by mouth daily as needed for anxiety.    Historical Provider, MD  losartan (COZAAR) 100 MG tablet Take 1 tablet (100 mg total) by mouth daily. 01/15/17   Sherran Needs, NP  metoprolol (LOPRESSOR) 100 MG tablet Take 1 tablet (100 mg total) by mouth 2 (two) times daily. 01/28/17   Sherran Needs, NP  Multiple Vitamin (MULTIVITAMIN WITH MINERALS) TABS tablet Take 1 tablet by mouth daily.    Historical Provider, MD  niacin 100 MG tablet Take 100 mg by mouth 2 (two) times daily with a meal.    Historical  Provider, MD  nitroGLYCERIN (NITROSTAT) 0.3 MG SL tablet Place 1 tablet (0.3 mg total) under the tongue every 5 (five) minutes as needed for chest pain. 01/12/17   Sherran Needs, NP  Omega-3 Fatty Acids (FISH OIL) 1000 MG CPDR Take 2,000 mg by mouth 2 (two) times daily.     Historical Provider, MD  PRESCRIPTION MEDICATION Inhale into the lungs at bedtime. CPAP    Historical Provider, MD  rivaroxaban (XARELTO) 20 MG TABS tablet Take 1 tablet (20 mg total) by mouth daily with supper. 02/11/17   Burtis Junes, NP  spironolactone (ALDACTONE) 25 MG tablet Take 1 tablet (25 mg total) by mouth daily. 01/04/17   Erma Heritage, PA  TURMERIC PO Take 2,000 mg by mouth 2 (two) times daily. 1 capsule - 1000 mg    Historical Provider, MD    Family History Family History  Problem Relation Age of Onset  . Stroke Mother   . Emphysema Mother   .  Lung cancer Mother   . Heart attack Father   . Drug abuse Father   . Heart attack Maternal Grandfather   . Heart attack Paternal Grandfather   . Diabetes Sister     Social History Social History  Substance Use Topics  . Smoking status: Never Smoker  . Smokeless tobacco: Never Used     Comment: Second-hand exposure through parents  . Alcohol use 1.2 oz/week    2 Glasses of wine per week     Allergies   Contrast media [iodinated diagnostic agents]; Gluten meal; Tizanidine; Whey; and Morphine and related   Review of Systems Review of Systems  Constitutional: Positive for diaphoresis. Negative for chills and fever.  HENT: Negative.   Respiratory: Positive for shortness of breath. Negative for cough.   Cardiovascular: Negative for chest pain.  Gastrointestinal: Positive for nausea. Negative for abdominal pain and vomiting.  Musculoskeletal: Negative.  Negative for myalgias.  Skin: Negative.   Neurological: Positive for light-headedness. Negative for syncope.     Physical Exam Updated Vital Signs BP (!) 123/93   Pulse 79   Temp 97.7 F (36.5  C) (Oral)   Resp 18   SpO2 95%   Physical Exam  Constitutional: He is oriented to person, place, and time. He appears well-developed and well-nourished.  HENT:  Head: Normocephalic.  Neck: Normal range of motion. Neck supple.  Cardiovascular: Normal rate.  An irregularly irregular rhythm present.  Pulmonary/Chest: Effort normal and breath sounds normal.  Abdominal: Soft. Bowel sounds are normal. There is no tenderness. There is no rebound and no guarding.  Musculoskeletal: Normal range of motion. He exhibits no edema.  Neurological: He is alert and oriented to person, place, and time.  Skin: Skin is warm and dry. No rash noted.  Psychiatric: He has a normal mood and affect.     ED Treatments / Results  Labs (all labs ordered are listed, but only abnormal results are displayed) Labs Reviewed  BASIC METABOLIC PANEL - Abnormal; Notable for the following:       Result Value   CO2 21 (*)    BUN 31 (*)    All other components within normal limits  CBC  PROTIME-INR  I-STAT TROPOININ, ED   Results for orders placed or performed during the hospital encounter of 16/10/96  Basic metabolic panel  Result Value Ref Range   Sodium 135 135 - 145 mmol/L   Potassium 3.8 3.5 - 5.1 mmol/L   Chloride 102 101 - 111 mmol/L   CO2 21 (L) 22 - 32 mmol/L   Glucose, Bld 97 65 - 99 mg/dL   BUN 31 (H) 6 - 20 mg/dL   Creatinine, Ser 1.19 0.61 - 1.24 mg/dL   Calcium 9.4 8.9 - 10.3 mg/dL   GFR calc non Af Amer >60 >60 mL/min   GFR calc Af Amer >60 >60 mL/min   Anion gap 12 5 - 15  CBC  Result Value Ref Range   WBC 7.6 4.0 - 10.5 K/uL   RBC 5.14 4.22 - 5.81 MIL/uL   Hemoglobin 15.2 13.0 - 17.0 g/dL   HCT 45.5 39.0 - 52.0 %   MCV 88.5 78.0 - 100.0 fL   MCH 29.6 26.0 - 34.0 pg   MCHC 33.4 30.0 - 36.0 g/dL   RDW 13.5 11.5 - 15.5 %   Platelets 168 150 - 400 K/uL  Protime-INR (order if Patient is taking Coumadin / Warfarin)  Result Value Ref Range   Prothrombin Time 14.5 11.4 -  15.2 seconds    INR 1.13   I-stat troponin, ED  Result Value Ref Range   Troponin i, poc 0.01 0.00 - 0.08 ng/mL   Comment 3             EKG  EKG Interpretation  Date/Time:  Wednesday February 11 2017 20:26:00 EDT Ventricular Rate:  92 PR Interval:    QRS Duration: 104 QT Interval:  416 QTC Calculation: 514 R Axis:   80 Text Interpretation:  Atrial fibrillation Nonspecific ST abnormality Prolonged QT Abnormal ECG Since last EKG, rate has increased Otherwise no significant change Confirmed by Ellender Hose MD, Lysbeth Galas [phone]) on 02/11/2017 10:20:58 PM       Radiology Dg Chest 2 View  Result Date: 02/11/2017 CLINICAL DATA:  Shortness of breath . EXAM: CHEST  2 VIEW COMPARISON:  11/10/2016. FINDINGS: Mediastinum and hilar structures normal. No focal infiltrate. No pleural effusion or pneumothorax. Heart size normal. No acute bony abnormality. IMPRESSION: No acute cardiopulmonary disease . Electronically Signed   By: Marcello Moores  Register   On: 02/11/2017 10:55    Procedures Procedures (including critical care time)  Medications Ordered in ED Medications - No data to display   Initial Impression / Assessment and Plan / ED Course  I have reviewed the triage vital signs and the nursing notes.  Pertinent labs & imaging results that were available during my care of the patient were reviewed by me and considered in my medical decision making (see chart for details).     Patient presents with complaint of SOB, recurrent atrial fibrillation (new tonight since last cardioversion on 02/05/17), lightheadedness and diaphoresis. Seen in cardiology clinic this morning.  Here, labs are normal, including troponin. EKG shows a-fib at rate of 92. Rate has been mid-60's on the monitor. He ambulates with persistent SOB, without hypoxia.   Cardiology consulted for input on disposition, but anticipate discharge home.  Discussed the patient with cardiology who feels that the patient can be discharged. The cardiologist on call  was able to determine that a follow up appointment for tomorrow was being arranged. Patient updated on conversation and plan to discharge. All questions answered with no unaddressed concerns. He will call Dr. Theodosia Blender office in the morning to get a time for his appointment.   Final Clinical Impressions(s) / ED Diagnoses   Final diagnoses:  None   1. Atrial fibrillation 2. SOB  New Prescriptions New Prescriptions   No medications on file     Charlann Lange, PA-C 02/13/17 [place], MD 02/13/17 [phone]

## 2017-02-11 NOTE — ED Notes (Addendum)
Pt ambulated in hall, pulse ox maintained around 95-96% while ambulating.

## 2017-02-11 NOTE — Patient Instructions (Addendum)
We will be checking the following labs today - BMET, CBC, TSH, HPF and BNP  Please go to [facility] to [place] on the first floor for a chest Xray - you may walk in.   Breathing test at [facility]  Medication Instructions:    Continue with your current medicines. BUT  I am cutting the Amiodarone back to just one pill a day  When you finish you Eliquis - start Xarelto 20 mg a day - take with your largest meal of the day.     Testing/Procedures To Be Arranged:  N/A  Follow-Up:   See Dr. [name] as planned.     Other Special Instructions:   Monitor your blood pressure at home    If you need a refill on your cardiac medications before your next appointment, please call your pharmacy.   Call the [facility] office at [phone] if you have any questions, problems or concerns.

## 2017-02-11 NOTE — ED Triage Notes (Signed)
Pt states he has been treated for afib, and chest pain recently; pt states left side chest pressure today at 5pm with some SOB and dizziness; pt states on blood thinner; pt a&ox 4 on arrival. Pt speaking in full and complete sentences; pt c/o pain at 3/10 on arrival.

## 2017-02-11 NOTE — Discharge Instructions (Signed)
Keep your scheduled appointment tomorrow with cardiology. Return to the emergency department with any worsening or new symptoms of concern.

## 2017-02-12 ENCOUNTER — Telehealth: Payer: Self-pay | Admitting: Cardiology

## 2017-02-12 DIAGNOSIS — Z79899 Other long term (current) drug therapy: Secondary | ICD-10-CM

## 2017-02-12 LAB — CBC
Hematocrit: 38.9 % (ref 37.5–51.0)
Hemoglobin: 13.5 g/dL (ref 13.0–17.7)
MCH: 30.1 pg (ref 26.6–33.0)
MCHC: 34.7 g/dL (ref 31.5–35.7)
MCV: 87 fL (ref 79–97)
Platelets: 157 10*3/uL (ref 150–379)
RBC: 4.48 x10E6/uL (ref 4.14–5.80)
RDW: 14.2 % (ref 12.3–15.4)
WBC: 6.7 10*3/uL (ref 3.4–10.8)

## 2017-02-12 LAB — HEPATIC FUNCTION PANEL
ALT: 36 IU/L (ref 0–44)
AST: 32 IU/L (ref 0–40)
Albumin: 4.5 g/dL (ref 3.6–4.8)
Alkaline Phosphatase: 48 IU/L (ref 39–117)
Bilirubin Total: 0.7 mg/dL (ref 0.0–1.2)
Bilirubin, Direct: 0.2 mg/dL (ref 0.00–0.40)
Total Protein: 6.5 g/dL (ref 6.0–8.5)

## 2017-02-12 LAB — BASIC METABOLIC PANEL
BUN/Creatinine Ratio: 19 (ref 10–24)
BUN: 21 mg/dL (ref 8–27)
CO2: 21 mmol/L (ref 18–29)
Calcium: 9.2 mg/dL (ref 8.6–10.2)
Chloride: 103 mmol/L (ref 96–106)
Creatinine, Ser: 1.12 mg/dL (ref 0.76–1.27)
GFR calc Af Amer: 78 mL/min/{1.73_m2} (ref >59)
GFR calc non Af Amer: 68 mL/min/{1.73_m2} (ref >59)
Glucose: 101 mg/dL — ABNORMAL HIGH (ref 65–99)
Potassium: 4.2 mmol/L (ref 3.5–5.2)
Sodium: 142 mmol/L (ref 134–144)

## 2017-02-12 LAB — PRO B NATRIURETIC PEPTIDE: NT-Pro BNP: 532 pg/mL — ABNORMAL HIGH (ref 0–376)

## 2017-02-12 LAB — TSH: TSH: 2.5 u[IU]/mL (ref 0.450–4.500)

## 2017-02-12 MED ORDER — FUROSEMIDE 20 MG PO TABS: 20.0000 mg | ORAL_TABLET | Freq: Every day | ORAL | 3 refills | Status: DC

## 2017-02-12 NOTE — Progress Notes (Deleted)
Cardiology Office Note    Date:  02/12/2017   ID:  Troy [name], DOB [birth year], MRN [id number]  PCP:  Velna Hatchet, MD  Cardiologist:  Fransico Him, MD   No chief complaint on file.   History of Present Illness:  Troy [name] is a 66 y.o. male ***    Past Medical History:  Diagnosis Date  . Arthritis    "knees" (01/02/2017)  . CAD (coronary artery disease)    a. s/p PCI in 2010 in [place]. b. Unstable angina/LHC 12/15/4268 complicated by V fib arrest after contrast injection. Cath 06/19/2015 s/p DES to mid LAD and RPDA.  Marland Kitchen Dyslipidemia   . Essential hypertension   . Lung nodule < 6cm on CT 06/16/15   Right lung  . Mitral regurgitation    a. Mild-mod by echo 06/2015.  . OSA on CPAP   . PAF (paroxysmal atrial fibrillation) ([place])    a. 06/2015 noted to be in new a-fib when arrived with unstable angina. Converted to NSR after being shocked in the cath lab for vfib;  b. 06/2015 Eliquis initiated as PAF noted on event monitor.  . Pulmonary nodule 07/2015   a. 1.5 x 1.2 cm smoothly marginated nodule in the central aspect of the right lower lobe with recommendation correlation with nonemergent PET-CT to exclude a neoplasm , stable by CT 07/2016  . Ventricular fibrillation ([place])    a. occured during cath 06/18/2015.    Past Surgical History:  Procedure Laterality Date  . CARDIAC CATHETERIZATION N/A 06/18/2015   Procedure: Left Heart Cath and Coronary Angiography;  Surgeon: Burnell Blanks, MD;  Location: [facility] [facility];  Service: Cardiovascular;  Laterality: N/A;  . CARDIAC CATHETERIZATION N/A 06/19/2015   Procedure: Coronary Stent Intervention;  Surgeon: Peter M Martinique, MD;  Location: [facility];  Service: Cardiovascular;  Laterality: N/A;  . CARDIAC CATHETERIZATION N/A 11/13/2016   Procedure: Left Heart Cath and Coronary Angiography;  Surgeon: Lorretta Harp, MD;  Location: [facility];  Service: Cardiovascular;  Laterality: N/A;  . CARDIOVERSION N/A  11/17/2016   Procedure: CARDIOVERSION;  Surgeon: Larey Dresser, MD;  Location: [facility] ENDOSCOPY;  Service: Cardiovascular;  Laterality: N/A;  . CARDIOVERSION N/A 01/03/2017   Procedure: CARDIOVERSION;  Surgeon: Lelon Perla, MD;  Location: [facility];  Service: Cardiovascular;  Laterality: N/A;  . CARDIOVERSION N/A 02/05/2017   Procedure: Cardioversion;  Surgeon: Evans Lance, MD;  Location: [facility];  Service: Cardiovascular;  Laterality: N/A;  . CARTILAGE SURGERY Bilateral    "thumbs"  . CATARACT EXTRACTION W/ INTRAOCULAR LENS  IMPLANT, BILATERAL Bilateral   . CORONARY ANGIOPLASTY WITH STENT PLACEMENT  ~ 2007  . JOINT REPLACEMENT    . KNEE ARTHROSCOPY Right   . RETINAL DETACHMENT SURGERY Bilateral    "laser thing"  . TEE WITHOUT CARDIOVERSION N/A 11/17/2016   Procedure: TRANSESOPHAGEAL ECHOCARDIOGRAM (TEE);  Surgeon: Larey Dresser, MD;  Location: [facility];  Service: Cardiovascular;  Laterality: N/A;  . TOTAL KNEE ARTHROPLASTY Left 2000s    Current Medications: No outpatient prescriptions have been marked as taking for the 02/13/17 encounter (Appointment) with Troy Margarita, MD.    Allergies:   Contrast media [iodinated diagnostic agents]; Gluten meal; Tizanidine; Whey; and Morphine and related   Social History   Social History  . Marital status: Divorced    Spouse name: N/A  . Number of children: N/A  . Years of education: N/A   Social History Main Topics  .  Smoking status: Never Smoker  . Smokeless tobacco: Never Used     Comment: Second-hand exposure through parents  . Alcohol use 1.2 oz/week    2 Glasses of wine per week  . Drug use: No  . Sexual activity: Yes   Other Topics Concern  . Not on file   Social History Narrative   Originally he is from [place], [place]. He moved to [facility] in 2013. He has a dog & cat at home. No bird or mold exposure. Has a hot tub but hasn't used it in 4 months. 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      Family History:  The patient's  ***family history includes Diabetes in his sister; Drug abuse in his father; Emphysema in his mother; Heart attack in his father, maternal grandfather, and paternal grandfather; Lung cancer in his mother; Stroke in his mother.   ROS:   Please see the history of present illness.    ROS All other systems reviewed and are negative.  No flowsheet data found.     PHYSICAL EXAM:   VS:  There were no vitals taken for this visit.   GEN: Well nourished, well developed, in no acute distress  HEENT: normal  Neck: no JVD, carotid bruits, or masses Cardiac: ***RRR; no murmurs, rubs, or gallops,no edema.  Intact distal pulses bilaterally.  Respiratory:  clear to auscultation bilaterally, normal work of breathing GI: soft, nontender, nondistended, + BS MS: no deformity or atrophy  Skin: warm and dry, no rash Neuro:  Alert and Oriented x 3, Strength and sensation are intact Psych: euthymic mood, full affect  Wt Readings from Last 3 Encounters:  02/11/17 238 lb (108 kg)  02/05/17 240 lb (108.9 kg)  01/27/17 236 lb (107 kg)      Studies/Labs Reviewed:   EKG:  EKG is*** ordered today.  The ekg ordered today demonstrates ***  Recent Labs: 01/04/2017: Magnesium 2.0 02/11/2017: ALT 36; BUN 31; Creatinine, Ser 1.19; Hemoglobin 15.2; NT-Pro BNP 532; Platelets 168; Potassium 3.8; Sodium 135; TSH 2.500   Lipid Panel    Component Value Date/Time   CHOL 94 (L) 08/15/2015 0912   TRIG 74 08/15/2015 0912   HDL 37 (L) 08/15/2015 0912   CHOLHDL 2.5 08/15/2015 0912   VLDL 15 08/15/2015 0912   LDLCALC 42 08/15/2015 0912    Additional studies/ records that were reviewed today include:  ***    ASSESSMENT:    No diagnosis found.   PLAN:  In order of problems listed above:  1. ***    Medication Adjustments/Labs and Tests Ordered: Current medicines are reviewed at length with the patient today.  Concerns regarding medicines are outlined above.  Medication changes, Labs and Tests ordered  today are listed in the Patient Instructions below.  There are no Patient Instructions on file for this visit.   Signed, Fransico Him, MD  02/12/2017 10:35 PM    [facility] [address], [place], [place]  [address] Phone: [phone]; Fax: [phone]

## 2017-02-12 NOTE — Telephone Encounter (Signed)
-----   Message from Burtis Junes, NP sent at 02/12/2017  8:38 AM EDT ----- Ok to report. Labs are stable - fluid test is just mildly elevated.  Would add lasix 20 mg a day. Monitor weight, restrict salt, etc.  BMET and BNP in one week and otherwise, would continue on current regimen.

## 2017-02-12 NOTE — Telephone Encounter (Signed)
Patient calling and states that he was in the hospital yesterday for SOB. He states that they told him to be seen in our office. Patient already made an appointment with the operator for 02/13/17 prior to sending the call through. Patient is complaining of DOE.   Patient had results from his visit with Truitt Merle, NP yesterday. Patient made aware of results. Patient made aware of Cecille Rubin Gerhardt's recommendations and notified to start lasix at 20 mg daily. Prescription sent to patient's preferred pharmacy. Patient notified that the lasix will help with his SOB. Repeat BMET and BNP ordered for 1 week.   Patient advised that it may be better to reschedule tomorrows appointment for a later date to see if the lasix helped with his SOB. Patient adamant about keeping appointment with Dr. Radford Pax tomorrow.

## 2017-02-12 NOTE — Telephone Encounter (Signed)
New message    Pt c/o Shortness Of Breath: STAT if SOB developed within the last 24 hours or pt is noticeably SOB on the phone  1. Are you currently SOB (can you hear that pt is SOB on the phone)? Pt says yes, can not hear on phone   2. How long have you been experiencing SOB? Since yesterday-went to ED last night.  3. Are you SOB when sitting or when up moving around? Sitting and up moving around  4. Are you currently experiencing any other symptoms? Pt says difficulty walking, unsteady

## 2017-02-13 ENCOUNTER — Encounter (HOSPITAL_COMMUNITY): Payer: Self-pay | Admitting: Nurse Practitioner

## 2017-02-13 ENCOUNTER — Ambulatory Visit: Payer: Medicare Other | Admitting: Cardiology

## 2017-02-13 ENCOUNTER — Ambulatory Visit (HOSPITAL_COMMUNITY)
Admission: RE | Admit: 2017-02-13 | Discharge: 2017-02-13 | Disposition: A | Discharge status: Home / Self Care | Payer: Medicare Other | Source: Ambulatory Visit | Attending: Nurse Practitioner | Admitting: Nurse Practitioner

## 2017-02-13 VITALS — BP 152/84 | HR 79 | Ht 71.0 in | Wt 235.4 lb

## 2017-02-13 DIAGNOSIS — Z823 Family history of stroke: Secondary | ICD-10-CM | POA: Insufficient documentation

## 2017-02-13 DIAGNOSIS — Z9841 Cataract extraction status, right eye: Secondary | ICD-10-CM | POA: Insufficient documentation

## 2017-02-13 DIAGNOSIS — Z888 Allergy status to other drugs, medicaments and biological substances status: Secondary | ICD-10-CM | POA: Diagnosis not present

## 2017-02-13 DIAGNOSIS — Z9889 Other specified postprocedural states: Secondary | ICD-10-CM | POA: Diagnosis not present

## 2017-02-13 DIAGNOSIS — Z8249 Family history of ischemic heart disease and other diseases of the circulatory system: Secondary | ICD-10-CM | POA: Diagnosis not present

## 2017-02-13 DIAGNOSIS — Z96652 Presence of left artificial knee joint: Secondary | ICD-10-CM | POA: Diagnosis not present

## 2017-02-13 DIAGNOSIS — I10 Essential (primary) hypertension: Secondary | ICD-10-CM | POA: Diagnosis not present

## 2017-02-13 DIAGNOSIS — Z833 Family history of diabetes mellitus: Secondary | ICD-10-CM | POA: Insufficient documentation

## 2017-02-13 DIAGNOSIS — Z9842 Cataract extraction status, left eye: Secondary | ICD-10-CM | POA: Diagnosis not present

## 2017-02-13 DIAGNOSIS — Z836 Family history of other diseases of the respiratory system: Secondary | ICD-10-CM | POA: Diagnosis not present

## 2017-02-13 DIAGNOSIS — Z7902 Long term (current) use of antithrombotics/antiplatelets: Secondary | ICD-10-CM | POA: Diagnosis not present

## 2017-02-13 DIAGNOSIS — G4733 Obstructive sleep apnea (adult) (pediatric): Secondary | ICD-10-CM | POA: Diagnosis not present

## 2017-02-13 DIAGNOSIS — Z91041 Radiographic dye allergy status: Secondary | ICD-10-CM | POA: Insufficient documentation

## 2017-02-13 DIAGNOSIS — Z801 Family history of malignant neoplasm of trachea, bronchus and lung: Secondary | ICD-10-CM | POA: Diagnosis not present

## 2017-02-13 DIAGNOSIS — M170 Bilateral primary osteoarthritis of knee: Secondary | ICD-10-CM | POA: Insufficient documentation

## 2017-02-13 DIAGNOSIS — Z813 Family history of other psychoactive substance abuse and dependence: Secondary | ICD-10-CM | POA: Diagnosis not present

## 2017-02-13 DIAGNOSIS — I2510 Atherosclerotic heart disease of native coronary artery without angina pectoris: Secondary | ICD-10-CM | POA: Diagnosis not present

## 2017-02-13 DIAGNOSIS — I4819 Other persistent atrial fibrillation: Secondary | ICD-10-CM

## 2017-02-13 DIAGNOSIS — I481 Persistent atrial fibrillation: Secondary | ICD-10-CM | POA: Diagnosis not present

## 2017-02-13 DIAGNOSIS — Z885 Allergy status to narcotic agent status: Secondary | ICD-10-CM | POA: Diagnosis not present

## 2017-02-13 DIAGNOSIS — I480 Paroxysmal atrial fibrillation: Secondary | ICD-10-CM | POA: Diagnosis not present

## 2017-02-13 DIAGNOSIS — Z91018 Allergy to other foods: Secondary | ICD-10-CM | POA: Diagnosis not present

## 2017-02-13 DIAGNOSIS — Z955 Presence of coronary angioplasty implant and graft: Secondary | ICD-10-CM | POA: Diagnosis not present

## 2017-02-13 DIAGNOSIS — I340 Nonrheumatic mitral (valve) insufficiency: Secondary | ICD-10-CM | POA: Insufficient documentation

## 2017-02-13 DIAGNOSIS — E785 Hyperlipidemia, unspecified: Secondary | ICD-10-CM | POA: Insufficient documentation

## 2017-02-13 DIAGNOSIS — Z951 Presence of aortocoronary bypass graft: Secondary | ICD-10-CM | POA: Insufficient documentation

## 2017-02-16 ENCOUNTER — Ambulatory Visit (HOSPITAL_COMMUNITY)
Admission: RE | Admit: 2017-02-16 | Discharge: 2017-02-16 | Disposition: A | Discharge status: Home / Self Care | Payer: Medicare Other | Source: Ambulatory Visit | Attending: Nurse Practitioner | Admitting: Nurse Practitioner

## 2017-02-16 ENCOUNTER — Other Ambulatory Visit (HOSPITAL_COMMUNITY): Payer: Self-pay | Admitting: *Deleted

## 2017-02-16 DIAGNOSIS — Z79899 Other long term (current) drug therapy: Secondary | ICD-10-CM

## 2017-02-16 DIAGNOSIS — R0602 Shortness of breath: Secondary | ICD-10-CM | POA: Diagnosis not present

## 2017-02-16 DIAGNOSIS — I480 Paroxysmal atrial fibrillation: Secondary | ICD-10-CM | POA: Diagnosis not present

## 2017-02-16 LAB — PULMONARY FUNCTION TEST
DL/VA % pred: 78 %
DL/VA: 3.65 ml/min/mmHg/L
DLCO cor % pred: 72 %
DLCO cor: 24.57 ml/min/mmHg
DLCO unc % pred: 72 %
DLCO unc: 24.57 ml/min/mmHg
FEF 25-75 Post: 4.08 L/sec
FEF 25-75 Pre: 2.93 L/sec
FEF2575-%Change-Post: 39 %
FEF2575-%Pred-Post: 151 %
FEF2575-%Pred-Pre: 108 %
FEV1-%Change-Post: 9 %
FEV1-%Pred-Post: 112 %
FEV1-%Pred-Pre: 102 %
FEV1-Post: 3.91 L
FEV1-Pre: 3.58 L
FEV1FVC-%Change-Post: 3 %
FEV1FVC-%Pred-Pre: 102 %
FEV6-%Change-Post: 5 %
FEV6-%Pred-Post: 112 %
FEV6-%Pred-Pre: 106 %
FEV6-Post: 4.98 L
FEV6-Pre: 4.73 L
FEV6FVC-%Change-Post: 0 %
FEV6FVC-%Pred-Post: 105 %
FEV6FVC-%Pred-Pre: 105 %
FVC-%Change-Post: 5 %
FVC-%Pred-Post: 106 %
FVC-%Pred-Pre: 101 %
FVC-Post: 5 L
FVC-Pre: 4.74 L
Post FEV1/FVC ratio: 78 %
Post FEV6/FVC ratio: 100 %
Pre FEV1/FVC ratio: 76 %
Pre FEV6/FVC Ratio: 100 %

## 2017-02-16 MED ORDER — ALBUTEROL SULFATE (2.5 MG/3ML) 0.083% IN NEBU
2.5000 mg | INHALATION_SOLUTION | Freq: Once | RESPIRATORY_TRACT | Status: AC
  Administered 2017-02-16: 2.5 mg via RESPIRATORY_TRACT

## 2017-02-16 NOTE — Progress Notes (Addendum)
Primary Care Physician: Velna Hatchet, MD Referring Physician:Rhonda Barrett, PA   Sahand Gosch is a 66 y.o. male with a h/o 06/2015 DES mLAD &RPDA w/ VF arrest, PCI in [place] 2010, HTN, HLD, MR, PAF, pulm nodule stable 07/2016 in the afib clinic for evaluation. He was recently loaded on amiodarone and  successfully cardioverted one week ago with Dr. [name] to SR around 50 bpm.  He was feeling short of breath in SR and saw Truitt Merle 4/4. She told pt to reduce amiodarone to 200 mg a day, but before he had a chance to do this, he went back into afib that afternoon at which time he presented to the ER. No changes were made and he was referred to the afib clinic.   In the afib clinic,4/6, he states that shortness of breath has improved in afib. Ekg today shows afib with v rate of 79 bpm. He continues with amiodarone at 200 mg bid. He has not missed any xarelto doses.  Today, he denies symptoms of palpitations, chest pain, shortness of breath, orthopnea, PND, lower extremity edema, dizziness, presyncope, syncope, or neurologic sequela. The patient is tolerating medications without difficulties and is otherwise without complaint today.   Past Medical History:  Diagnosis Date  . Arthritis    "knees" (01/02/2017)  . CAD (coronary artery disease)    a. s/p PCI in 2010 in [place]. b. Unstable angina/LHC 0/04/2693 complicated by V fib arrest after contrast injection. Cath 06/19/2015 s/p DES to mid LAD and RPDA.  Marland Kitchen Dyslipidemia   . Essential hypertension   . Lung nodule < 6cm on CT 06/16/15   Right lung  . Mitral regurgitation    a. Mild-mod by echo 06/2015.  . OSA on CPAP   . PAF (paroxysmal atrial fibrillation) ([place])    a. 06/2015 noted to be in new a-fib when arrived with unstable angina. Converted to NSR after being shocked in the cath lab for vfib;  b. 06/2015 Eliquis initiated as PAF noted on event monitor.  . Pulmonary nodule 07/2015   a. 1.5 x 1.2 cm smoothly marginated nodule in the  central aspect of the right lower lobe with recommendation correlation with nonemergent PET-CT to exclude a neoplasm , stable by CT 07/2016  . Ventricular fibrillation ([place])    a. occured during cath 06/18/2015.   Past Surgical History:  Procedure Laterality Date  . CARDIAC CATHETERIZATION N/A 06/18/2015   Procedure: Left Heart Cath and Coronary Angiography;  Surgeon: Burnell Blanks, MD;  Location: [facility];  Service: Cardiovascular;  Laterality: N/A;  . CARDIAC CATHETERIZATION N/A 06/19/2015   Procedure: Coronary Stent Intervention;  Surgeon: Peter M Martinique, MD;  Location: [facility];  Service: Cardiovascular;  Laterality: N/A;  . CARDIAC CATHETERIZATION N/A 11/13/2016   Procedure: Left Heart Cath and Coronary Angiography;  Surgeon: Lorretta Harp, MD;  Location: [facility];  Service: Cardiovascular;  Laterality: N/A;  . CARDIOVERSION N/A 11/17/2016   Procedure: CARDIOVERSION;  Surgeon: Larey Dresser, MD;  Location: [facility];  Service: Cardiovascular;  Laterality: N/A;  . CARDIOVERSION N/A 01/03/2017   Procedure: CARDIOVERSION;  Surgeon: Lelon Perla, MD;  Location: [facility];  Service: Cardiovascular;  Laterality: N/A;  . CARDIOVERSION N/A 02/05/2017   Procedure: Cardioversion;  Surgeon: Evans Lance, MD;  Location: [facility];  Service: Cardiovascular;  Laterality: N/A;  . CARTILAGE SURGERY Bilateral    "thumbs"  . CATARACT EXTRACTION W/ INTRAOCULAR LENS  IMPLANT, BILATERAL  Bilateral   . CORONARY ANGIOPLASTY WITH STENT PLACEMENT  ~ 2007  . JOINT REPLACEMENT    . KNEE ARTHROSCOPY Right   . RETINAL DETACHMENT SURGERY Bilateral    "laser thing"  . TEE WITHOUT CARDIOVERSION N/A 11/17/2016   Procedure: TRANSESOPHAGEAL ECHOCARDIOGRAM (TEE);  Surgeon: Larey Dresser, MD;  Location: [place];  Service: Cardiovascular;  Laterality: N/A;  . TOTAL KNEE ARTHROPLASTY Left 2000s    Current Outpatient Prescriptions  Medication Sig Dispense Refill  .  amiodarone (PACERONE) 200 MG tablet Take 1 tablet (200 mg total) by mouth daily. (Patient taking differently: Take 200 mg by mouth 2 (two) times daily. ) 60 tablet 3  . atorvastatin (LIPITOR) 80 MG tablet Take 1 tablet (80 mg total) by mouth daily at 6 PM. 30 tablet 6  . furosemide (LASIX) 20 MG tablet Take 1 tablet (20 mg total) by mouth daily. 90 tablet 3  . isosorbide mononitrate (IMDUR) 60 MG 24 hr tablet Take 1 tablet (60 mg total) by mouth daily. 90 tablet 1  . LORazepam (ATIVAN) 0.5 MG tablet Take 0.5 mg by mouth daily as needed for anxiety.    Marland Kitchen losartan (COZAAR) 100 MG tablet Take 1 tablet (100 mg total) by mouth daily. 30 tablet 2  . metoprolol (LOPRESSOR) 100 MG tablet Take 1 tablet (100 mg total) by mouth 2 (two) times daily. 180 tablet 3  . Multiple Vitamin (MULTIVITAMIN WITH MINERALS) TABS tablet Take 1 tablet by mouth daily.    . niacin 100 MG tablet Take 100 mg by mouth 2 (two) times daily with a meal.    . nitroGLYCERIN (NITROSTAT) 0.3 MG SL tablet Place 1 tablet (0.3 mg total) under the tongue every 5 (five) minutes as needed for chest pain. 90 tablet 6  . Omega-3 Fatty Acids (FISH OIL) 1000 MG CPDR Take 2,000 mg by mouth 2 (two) times daily.     Marland Kitchen PRESCRIPTION MEDICATION Inhale into the lungs at bedtime. CPAP    . rivaroxaban (XARELTO) 20 MG TABS tablet Take 1 tablet (20 mg total) by mouth daily with supper. 30 tablet 6  . spironolactone (ALDACTONE) 25 MG tablet Take 1 tablet (25 mg total) by mouth daily. 90 tablet 3  . TURMERIC PO Take 2,000 mg by mouth 2 (two) times daily. 1 capsule - 1000 mg     No current facility-administered medications for this encounter.     Allergies  Allergen Reactions  . Contrast Media [Iodinated Diagnostic Agents] Other (See Comments)    V-Fib arrest during cardiac cath suspected d/t contrast dye in 2016  . Gluten Meal Other (See Comments)    REACTION: Celiac disease (severe stomach pain)  . Tizanidine Anxiety    Depression  . Whey Other (See  Comments)    REACTION: Celiac disease (severe stomach pain)  . Morphine And Related Other (See Comments)    REACTION: "REALLY BAD STOMACH PAIN"    Social History   Social History  . Marital status: Divorced    Spouse name: N/A  . Number of children: N/A  . Years of education: N/A   Occupational History  . Not on file.   Social History Main Topics  . Smoking status: Never Smoker  . Smokeless tobacco: Never Used     Comment: Second-hand exposure through parents  . Alcohol use 1.2 oz/week    2 Glasses of wine per week  . Drug use: No  . Sexual activity: Yes   Other Topics Concern  . Not on file  Social History Narrative   Originally he is from [place], [place]. He moved to [facility] in 2013. He has a dog & cat at home. No bird or mold exposure. Has a hot tub but hasn't used it in 4 months. 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     Family History  Problem Relation Age of Onset  . Stroke Mother   . Emphysema Mother   . Lung cancer Mother   . Heart attack Father   . Drug abuse Father   . Heart attack Maternal Grandfather   . Heart attack Paternal Grandfather   . Diabetes Sister     ROS- All systems are reviewed and negative except as per the HPI above  Physical Exam: Vitals:   02/13/17 1000  BP: (!) 152/84  Pulse: 79  Weight: 235 lb 6.4 oz (106.8 kg)  Height: 5\' 11"  (1.803 m)   Wt Readings from Last 3 Encounters:  02/13/17 235 lb 6.4 oz (106.8 kg)  02/11/17 238 lb (108 kg)  02/05/17 240 lb (108.9 kg)    Labs: Lab Results  Component Value Date   NA 135 02/11/2017   K 3.8 02/11/2017   CL 102 02/11/2017   CO2 21 (L) 02/11/2017   GLUCOSE 97 02/11/2017   BUN 31 (H) 02/11/2017   CREATININE 1.19 02/11/2017   CALCIUM 9.4 02/11/2017   MG 2.0 01/04/2017   Lab Results  Component Value Date   INR 1.13 02/11/2017   Lab Results  Component Value Date   CHOL 94 (L) 08/15/2015   HDL 37 (L) 08/15/2015   LDLCALC 42 08/15/2015   TRIG 74 08/15/2015     GEN- The patient  is well appearing, alert and oriented x 3 today.   Head- normocephalic, atraumatic Eyes-  Sclera clear, conjunctiva pink Ears- hearing intact Oropharynx- clear Neck- supple, no JVP Lymph- no cervical lymphadenopathy Lungs- Clear to ausculation bilaterally, normal work of breathing Heart- irregular rate and rhythm, no murmurs, rubs or gallops, PMI not laterally displaced GI- soft, NT, ND, + BS Extremities- no clubbing, cyanosis, or edema MS- no significant deformity or atrophy Skin- no rash or lesion Psych- euthymic mood, full affect Neuro- strength and sensation are intact  EKG- afib at 79 bpm, qrs int 108 ms, qtc 495 ms  Echo-- Left ventricle: The cavity size was normal. There was severe   concentric hypertrophy. Systolic function was normal. The   estimated ejection fraction was in the range of 60% to 65%. Wall   motion was normal; there were no regional wall motion   abnormalities. The study is not technically sufficient to allow   evaluation of LV diastolic function. - Aortic valve: Trileaflet; mildly calcified leaflets. There was   mild regurgitation. - Mitral valve: Mildly thickened leaflets . There was trivial   regurgitation. - Left atrium: Moderately dilated. 50 mm - Right ventricle: The cavity size was mildly dilated. - Right atrium: Severely dilated. - Tricuspid valve: There was trivial regurgitation. - Pulmonary arteries: PA peak pressure: 20 mm Hg (S). - Inferior vena cava: The vessel was normal in size. The   respirophasic diameter changes were in the normal range (= 50%),   consistent with normal central venous pressure.  Impressions:  - Compared to a prior study in 06/2015, the LVEF has improved to   60-65%. There is moderate LAE and severe RAE.   Assessment and Plan: 1. Afib Successful cardioversion  4/4 Now returned to afib Spoke to Dr. [name] who suggests to continue amiodarone load at  200 mg bid and try repeat cardioversion, scheduled with him,  before saying amiodarone has failed. Decrease amiodarone to 200 mg q day after cardioversion He will reduce metoprolol to 50 mg bid day of cardioversion as he may not have tolerated brady with return to SR causing fatigue and shortness of breath He will continue xarelto, states no missed doses for at least 3 weeks   2. CAD Appears stable  3. OSA Educated re sleep apnea and atrial fib He states that he has restarted cpap   f/u with Dr. [name] 4/24 as scheduled     Geroge Baseman. Skylen Spiering, [facility] [address] [place], [place] [address] [phone]

## 2017-02-16 NOTE — Addendum Note (Signed)
Encounter addended by: Sherran Needs, NP on: 02/16/2017 11:58 AM<BR>    Actions taken: Sign clinical note

## 2017-02-18 ENCOUNTER — Telehealth: Payer: Self-pay | Admitting: *Deleted

## 2017-02-18 NOTE — Telephone Encounter (Signed)
-----   Message from Evans Lance, MD sent at 02/11/2017  8:57 PM EDT ----- Thanks Cecille Rubin. I will ask Dr. Carlyn Reichert to see regarding atrial fib RFA. GT ----- Message ----- From: Burtis Junes, NP Sent: 02/11/2017  10:05 AM To: Evans Lance, MD

## 2017-02-18 NOTE — Telephone Encounter (Signed)
appt made for 4/19 w/ [place]

## 2017-02-20 ENCOUNTER — Ambulatory Visit (HOSPITAL_COMMUNITY)
Admission: RE | Admit: 2017-02-20 | Discharge: 2017-02-20 | Disposition: A | Discharge status: Home / Self Care | Payer: Medicare Other | Source: Ambulatory Visit | Attending: Internal Medicine | Admitting: Internal Medicine

## 2017-02-20 ENCOUNTER — Encounter (HOSPITAL_COMMUNITY)
Admission: RE | Disposition: A | Discharge status: Home / Self Care | Payer: Self-pay | Source: Ambulatory Visit | Attending: Internal Medicine

## 2017-02-20 DIAGNOSIS — Z91041 Radiographic dye allergy status: Secondary | ICD-10-CM | POA: Insufficient documentation

## 2017-02-20 DIAGNOSIS — I340 Nonrheumatic mitral (valve) insufficiency: Secondary | ICD-10-CM | POA: Diagnosis not present

## 2017-02-20 DIAGNOSIS — I2510 Atherosclerotic heart disease of native coronary artery without angina pectoris: Secondary | ICD-10-CM | POA: Diagnosis not present

## 2017-02-20 DIAGNOSIS — Z7901 Long term (current) use of anticoagulants: Secondary | ICD-10-CM | POA: Insufficient documentation

## 2017-02-20 DIAGNOSIS — E785 Hyperlipidemia, unspecified: Secondary | ICD-10-CM | POA: Diagnosis not present

## 2017-02-20 DIAGNOSIS — Z955 Presence of coronary angioplasty implant and graft: Secondary | ICD-10-CM | POA: Insufficient documentation

## 2017-02-20 DIAGNOSIS — I481 Persistent atrial fibrillation: Secondary | ICD-10-CM | POA: Diagnosis not present

## 2017-02-20 DIAGNOSIS — Z8249 Family history of ischemic heart disease and other diseases of the circulatory system: Secondary | ICD-10-CM | POA: Diagnosis not present

## 2017-02-20 DIAGNOSIS — I4891 Unspecified atrial fibrillation: Secondary | ICD-10-CM | POA: Diagnosis present

## 2017-02-20 DIAGNOSIS — Z885 Allergy status to narcotic agent status: Secondary | ICD-10-CM | POA: Insufficient documentation

## 2017-02-20 DIAGNOSIS — G4733 Obstructive sleep apnea (adult) (pediatric): Secondary | ICD-10-CM | POA: Insufficient documentation

## 2017-02-20 DIAGNOSIS — I10 Essential (primary) hypertension: Secondary | ICD-10-CM | POA: Insufficient documentation

## 2017-02-20 DIAGNOSIS — I480 Paroxysmal atrial fibrillation: Secondary | ICD-10-CM | POA: Diagnosis present

## 2017-02-20 HISTORY — PX: CARDIOVERSION: EP1203

## 2017-02-20 SURGERY — CARDIOVERSION (CATH LAB)
Anesthesia: LOCAL

## 2017-02-20 SURGERY — CARDIOVERSION
Anesthesia: Monitor Anesthesia Care

## 2017-02-20 MED ORDER — FENTANYL CITRATE (PF) 100 MCG/2ML IJ SOLN
INTRAMUSCULAR | Status: AC
  Filled 2017-02-20: qty 2

## 2017-02-20 MED ORDER — SODIUM CHLORIDE 0.9% FLUSH: 3.0000 mL | Freq: Two times a day (BID) | INTRAVENOUS | Status: DC

## 2017-02-20 MED ORDER — MIDAZOLAM HCL 2 MG/2ML IJ SOLN
INTRAMUSCULAR | Status: DC | PRN
  Administered 2017-02-20 (×3): 2 mg via INTRAVENOUS

## 2017-02-20 MED ORDER — SODIUM CHLORIDE 0.9 % IV SOLN: 250.0000 mL | INTRAVENOUS | Status: DC

## 2017-02-20 MED ORDER — MIDAZOLAM HCL 5 MG/5ML IJ SOLN
INTRAMUSCULAR | Status: AC
  Filled 2017-02-20: qty 5

## 2017-02-20 MED ORDER — FENTANYL CITRATE (PF) 100 MCG/2ML IJ SOLN
INTRAMUSCULAR | Status: DC | PRN
Start: 2017-02-20 — End: 2017-02-20
  Administered 2017-02-20 (×3): 25 ug via INTRAVENOUS

## 2017-02-20 MED ORDER — SODIUM CHLORIDE 0.9% FLUSH: 3.0000 mL | INTRAVENOUS | Status: DC | PRN

## 2017-02-20 SURGICAL SUPPLY — 1 items: PAD DEFIB LIFELINK (PAD) ×4 IMPLANT

## 2017-02-20 NOTE — Discharge Instructions (Signed)
TAKE 50MG  METOPROLOL TWICE A DAY     Electrical Cardioversion, Care After This sheet gives you information about how to care for yourself after your procedure. Your health care provider may also give you more specific instructions. If you have problems or questions, contact your health care provider. What can I expect after the procedure? After the procedure, it is common to have:  Some redness on the skin where the shocks were given. Follow these instructions at home:  Do not drive for 24 hours if you were given a medicine to help you relax (sedative).  Take over-the-counter and prescription medicines only as told by your health care provider.  Ask your health care provider how to check your pulse. Check it often.  Rest for 48 hours after the procedure or as told by your health care provider.  Avoid or limit your caffeine use as told by your health care provider. Contact a health care provider if:  You feel like your heart is beating too quickly or your pulse is not regular.  You have a serious muscle cramp that does not go away. Get help right away if:  You have discomfort in your chest.  You are dizzy or you feel faint.  You have trouble breathing or you are short of breath.  Your speech is slurred.  You have trouble moving an arm or leg on one side of your body.  Your fingers or toes turn cold or blue. This information is not intended to replace advice given to you by your health care provider. Make sure you discuss any questions you have with your health care provider. Document Released: 08/17/2013 Document Revised: 05/30/2016 Document Reviewed: 05/02/2016 Elsevier Interactive Patient Education  2017 [employer].

## 2017-02-20 NOTE — H&P (View-Only) (Signed)
HPI [name] returns today for followup. He is a pleasant 66 yo man with persistent atrial fib who had ERAF after DCCV. He was placed on amiodarone. He feels better as his ventricular rate is under better control. No other complaints today. His dyspnea has resolved. Allergies  Allergen Reactions  . Contrast Media [Iodinated Diagnostic Agents] Other (See Comments)    V-Fib arrest during cardiac cath suspected d/t contrast dye in 2016  . Gluten Meal Other (See Comments)    REACTION: Celiac disease (severe stomach pain)  . Tizanidine Anxiety    Depression  . Whey Other (See Comments)    REACTION: Celiac disease (severe stomach pain)  . Morphine And Related Other (See Comments)    REACTION: "REALLY BAD STOMACH PAIN"     Current Outpatient Prescriptions  Medication Sig Dispense Refill  . amiodarone (PACERONE) 200 MG tablet Take 1 tablet (200 mg total) by mouth 2 (two) times daily. 60 tablet 3  . apixaban (ELIQUIS) 5 MG TABS tablet Take 1 tablet (5 mg total) by mouth 2 (two) times daily. 60 tablet 3  . atorvastatin (LIPITOR) 80 MG tablet Take 1 tablet (80 mg total) by mouth daily at 6 PM. 30 tablet 6  . isosorbide mononitrate (IMDUR) 60 MG 24 hr tablet Take 1 tablet (60 mg total) by mouth daily. 90 tablet 1  . LORazepam (ATIVAN) 0.5 MG tablet Take 0.5 mg by mouth daily as needed for anxiety.    Marland Kitchen losartan (COZAAR) 100 MG tablet Take 1 tablet (100 mg total) by mouth daily. 30 tablet 2  . metoprolol (LOPRESSOR) 100 MG tablet Take 1 tablet (100 mg total) by mouth 2 (two) times daily. 180 tablet 3  . Multiple Vitamin (MULTIVITAMIN WITH MINERALS) TABS tablet Take 1 tablet by mouth daily.    . niacin 100 MG tablet Take 100 mg by mouth 2 (two) times daily with a meal.    . nitroGLYCERIN (NITROSTAT) 0.3 MG SL tablet Place 1 tablet (0.3 mg total) under the tongue every 5 (five) minutes as needed for chest pain. 90 tablet 6  . Omega-3 Fatty Acids (FISH OIL) 1000 MG CPDR Take 2,000 mg by  mouth 2 (two) times daily.     Marland Kitchen PRESCRIPTION MEDICATION Inhale into the lungs at bedtime. CPAP    . spironolactone (ALDACTONE) 25 MG tablet Take 1 tablet (25 mg total) by mouth daily. 90 tablet 3  . TURMERIC PO Take 2,000 mg by mouth 2 (two) times daily. 1 capsule - 1000 mg     No current facility-administered medications for this visit.      Past Medical History:  Diagnosis Date  . Arthritis    "knees" (01/02/2017)  . CAD (coronary artery disease)    a. s/p PCI in 2010 in [place]. b. Unstable angina/LHC 05/18/8920 complicated by V fib arrest after contrast injection. Cath 06/19/2015 s/p DES to mid LAD and RPDA.  Marland Kitchen Dyslipidemia   . Essential hypertension   . Lung nodule < 6cm on CT 06/16/15   Right lung  . Mitral regurgitation    a. Mild-mod by echo 06/2015.  . OSA on CPAP   . PAF (paroxysmal atrial fibrillation) ([place])    a. 06/2015 noted to be in new a-fib when arrived with unstable angina. Converted to NSR after being shocked in the cath lab for vfib;  b. 06/2015 Eliquis initiated as PAF noted on event monitor.  . Pulmonary nodule 07/2015   a. 1.5 x 1.2 cm smoothly  marginated nodule in the central aspect of the right lower lobe with recommendation correlation with nonemergent PET-CT to exclude a neoplasm , stable by CT 07/2016  . Ventricular fibrillation ([place])    a. occured during cath 06/18/2015.    ROS:   All systems reviewed and negative except as noted in the HPI.   Past Surgical History:  Procedure Laterality Date  . CARDIAC CATHETERIZATION N/A 06/18/2015   Procedure: Left Heart Cath and Coronary Angiography;  Surgeon: Burnell Blanks, MD;  Location: [facility];  Service: Cardiovascular;  Laterality: N/A;  . CARDIAC CATHETERIZATION N/A 06/19/2015   Procedure: Coronary Stent Intervention;  Surgeon: Peter M Martinique, MD;  Location: [facility];  Service: Cardiovascular;  Laterality: N/A;  . CARDIAC CATHETERIZATION N/A 11/13/2016   Procedure: Left Heart Cath and  Coronary Angiography;  Surgeon: Lorretta Harp, MD;  Location: [facility];  Service: Cardiovascular;  Laterality: N/A;  . CARDIOVERSION N/A 11/17/2016   Procedure: CARDIOVERSION;  Surgeon: Larey Dresser, MD;  Location: [facility];  Service: Cardiovascular;  Laterality: N/A;  . CARDIOVERSION N/A 01/03/2017   Procedure: CARDIOVERSION;  Surgeon: Lelon Perla, MD;  Location: [place];  Service: Cardiovascular;  Laterality: N/A;  . CARTILAGE SURGERY Bilateral    "thumbs"  . CATARACT EXTRACTION W/ INTRAOCULAR LENS  IMPLANT, BILATERAL Bilateral   . CORONARY ANGIOPLASTY WITH STENT PLACEMENT  ~ 2007  . JOINT REPLACEMENT    . KNEE ARTHROSCOPY Right   . RETINAL DETACHMENT SURGERY Bilateral    "laser thing"  . TEE WITHOUT CARDIOVERSION N/A 11/17/2016   Procedure: TRANSESOPHAGEAL ECHOCARDIOGRAM (TEE);  Surgeon: Larey Dresser, MD;  Location: [facility];  Service: Cardiovascular;  Laterality: N/A;  . TOTAL KNEE ARTHROPLASTY Left 2000s     Family History  Problem Relation Age of Onset  . Stroke Mother   . Emphysema Mother   . Lung cancer Mother   . Heart attack Father   . Drug abuse Father   . Heart attack Maternal Grandfather   . Heart attack Paternal Grandfather   . Diabetes Sister      Social History   Social History  . Marital status: Divorced    Spouse name: N/A  . Number of children: N/A  . Years of education: N/A   Occupational History  . Not on file.   Social History Main Topics  . Smoking status: Never Smoker  . Smokeless tobacco: Never Used     Comment: Second-hand exposure through parents  . Alcohol use 1.2 oz/week    2 Glasses of wine per week  . Drug use: No  . Sexual activity: Yes   Other Topics Concern  . Not on file   Social History Narrative   Originally he is from [place], [place]. He moved to [facility] in [place]. He has a dog & cat at home. No bird or mold exposure. Has a hot tub but hasn't used it in 4 months. 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      BP  126/90   Pulse 75   Ht 5\' 11"  (1.803 m)   Wt 236 lb (107 kg)   SpO2 96%   BMI 32.92 kg/m   Physical Exam:  Well appearing 66 yo man, NAD HEENT: Unremarkable Neck:  7 cm JVD, no thyromegally Lymphatics:  No adenopathy Back:  No CVA tenderness Lungs:  Clear with no wheezes HEART:  Regular rate rhythm, no murmurs, no rubs, no clicks Abd:  soft, positive bowel sounds,  no organomegally, no rebound, no guarding Ext:  2 plus pulses, no edema, no cyanosis, no clubbing Skin:  No rashes no nodules Neuro:  CN II through XII intact, motor grossly intact  EKG - atrial fib with a controlled VR.   Assess/Plan: 1. Persistent atrial fib - he is doing well as his rate is under better control. He has been anti-coagulated and has been on amio for over 3 weeks. He will come in next week for DCCV. 2. HTN - his blood pressure today is controlled. Will follow.  3. CAD - he denies anginal symptoms. Will follow. 4. Dyslipidemia - he will continue his statin therapy.  Mikle Bosworth.D.

## 2017-02-20 NOTE — Interval H&P Note (Signed)
History and Physical Interval Note:  02/20/2017 3:58 PM  Troy [name]  has presented today for surgery, with the diagnosis of afib  The various methods of treatment have been discussed with the patient and family. After consideration of risks, benefits and other options for treatment, the patient has consented to  Procedure(s): Cardioversion (N/A) as a surgical intervention .  The patient's history has been reviewed, patient examined, no change in status, stable for surgery.  I have reviewed the patient's chart and labs.  Questions were answered to the patient's satisfaction.     Troy [name]

## 2017-02-20 NOTE — Progress Notes (Signed)
Called Dr [name] due to client had question about medications; per Dr [name] client and his wife advised no change in amiodorone and decrease metoprolol to 50mg  twice a day instead of 100mg  twice a day and client and his wife voiced understanding

## 2017-02-23 ENCOUNTER — Encounter (HOSPITAL_COMMUNITY): Payer: Self-pay | Admitting: Internal Medicine

## 2017-02-26 ENCOUNTER — Ambulatory Visit (INDEPENDENT_AMBULATORY_CARE_PROVIDER_SITE_OTHER): Payer: Medicare Other | Admitting: Cardiology

## 2017-02-26 ENCOUNTER — Encounter: Payer: Self-pay | Admitting: Cardiology

## 2017-02-26 VITALS — BP 116/92 | HR 62 | Ht 71.0 in | Wt 236.6 lb

## 2017-02-26 DIAGNOSIS — I481 Persistent atrial fibrillation: Secondary | ICD-10-CM

## 2017-02-26 DIAGNOSIS — I4819 Other persistent atrial fibrillation: Secondary | ICD-10-CM

## 2017-02-26 MED ORDER — ISOSORBIDE MONONITRATE ER 120 MG PO TB24: 120.0000 mg | ORAL_TABLET | Freq: Every day | ORAL | 6 refills | Status: DC

## 2017-02-26 NOTE — Progress Notes (Signed)
Electrophysiology Office Note   Date:  02/26/2017   ID:  Troy [name], DOB 07/23/1951, MRN [id number]  PCP:  Velna Hatchet, MD  Cardiologist:  Radford Pax Primary Electrophysiologist:  Jonni Oelkers Meredith Leeds, MD    Chief Complaint  Patient presents with  . Follow-up    Persistent Afib  . Fatigue     History of Present Illness: Troy [name] is a 66 y.o. male who is being seen today for the evaluation of atrial fibrillation at the request of Velna Hatchet, MD. Presenting today for electrophysiology evaluation. H/o 06/2015 DES mLAD &RPDA w/ VF arrest, PCI in [place] 2010, HTN, HLD, MR, PAF, pulm nodule stable 07/2016. He was recently loaded on amiodarone and  successfully cardioverted to SR around 50 bpm. Patient went back into AF on 02/13/17. His main complaint was shortness of breath. Had repeat cardioversion 02/20/17.   He is having some chest pain. The pain is over his left chest. He cannot tell if it is associated with exertion. He does say that the character of the pain is similar to his pain prior to his stents being placed. He had a prior heart catheterization showed an 80% diagonal lesion. Prior to that, SPECT scan was done that showed a small focus of ischemia apex.  Today, he denies symptoms of palpitations, chest pain, shortness of breath, orthopnea, PND, lower extremity edema, claudication, dizziness, presyncope, syncope, bleeding, or neurologic sequela. The patient is tolerating medications without difficulties.    Past Medical History:  Diagnosis Date  . Arthritis    "knees" (01/02/2017)  . CAD (coronary artery disease)    a. s/p PCI in 2010 in [place]. b. Unstable angina/LHC 05/17/6766 complicated by V fib arrest after contrast injection. Cath 06/19/2015 s/p DES to mid LAD and RPDA.  Marland Kitchen Dyslipidemia   . Essential hypertension   . Lung nodule < 6cm on CT 06/16/15   Right lung  . Mitral regurgitation    a. Mild-mod by echo 06/2015.  . OSA on CPAP   . PAF (paroxysmal atrial  fibrillation) ([place])    a. 06/2015 noted to be in new a-fib when arrived with unstable angina. Converted to NSR after being shocked in the cath lab for vfib;  b. 06/2015 Eliquis initiated as PAF noted on event monitor.  . Pulmonary nodule 07/2015   a. 1.5 x 1.2 cm smoothly marginated nodule in the central aspect of the right lower lobe with recommendation correlation with nonemergent PET-CT to exclude a neoplasm , stable by CT 07/2016  . Ventricular fibrillation ([place])    a. occured during cath 06/18/2015.   Past Surgical History:  Procedure Laterality Date  . CARDIAC CATHETERIZATION N/A 06/18/2015   Procedure: Left Heart Cath and Coronary Angiography;  Surgeon: Burnell Blanks, MD;  Location: [facility];  Service: Cardiovascular;  Laterality: N/A;  . CARDIAC CATHETERIZATION N/A 06/19/2015   Procedure: Coronary Stent Intervention;  Surgeon: Peter M Martinique, MD;  Location: [facility];  Service: Cardiovascular;  Laterality: N/A;  . CARDIAC CATHETERIZATION N/A 11/13/2016   Procedure: Left Heart Cath and Coronary Angiography;  Surgeon: Lorretta Harp, MD;  Location: [facility];  Service: Cardiovascular;  Laterality: N/A;  . CARDIOVERSION N/A 11/17/2016   Procedure: CARDIOVERSION;  Surgeon: Larey Dresser, MD;  Location: [facility];  Service: Cardiovascular;  Laterality: N/A;  . CARDIOVERSION N/A 01/03/2017   Procedure: CARDIOVERSION;  Surgeon: Lelon Perla, MD;  Location: [facility];  Service: Cardiovascular;  Laterality: N/A;  . CARDIOVERSION  N/A 02/05/2017   Procedure: Cardioversion;  Surgeon: Evans Lance, MD;  Location: [facility];  Service: Cardiovascular;  Laterality: N/A;  . CARDIOVERSION N/A 02/20/2017   Procedure: Cardioversion;  Surgeon: Evans Lance, MD;  Location: [facility];  Service: Cardiovascular;  Laterality: N/A;  . CARTILAGE SURGERY Bilateral    "thumbs"  . CATARACT EXTRACTION W/ INTRAOCULAR LENS  IMPLANT, BILATERAL Bilateral   . CORONARY  ANGIOPLASTY WITH STENT PLACEMENT  ~ 2007  . JOINT REPLACEMENT    . KNEE ARTHROSCOPY Right   . RETINAL DETACHMENT SURGERY Bilateral    "laser thing"  . TEE WITHOUT CARDIOVERSION N/A 11/17/2016   Procedure: TRANSESOPHAGEAL ECHOCARDIOGRAM (TEE);  Surgeon: Larey Dresser, MD;  Location: [facility];  Service: Cardiovascular;  Laterality: N/A;  . TOTAL KNEE ARTHROPLASTY Left 2000s     Current Outpatient Prescriptions  Medication Sig Dispense Refill  . amiodarone (PACERONE) 200 MG tablet Take 1 tablet (200 mg total) by mouth daily. (Patient taking differently: Take 200 mg by mouth 2 (two) times daily. ) 60 tablet 3  . apixaban (ELIQUIS) 5 MG TABS tablet Take 5 mg by mouth 2 (two) times daily.    Marland Kitchen atorvastatin (LIPITOR) 80 MG tablet Take 1 tablet (80 mg total) by mouth daily at 6 PM. 30 tablet 6  . furosemide (LASIX) 20 MG tablet Take 1 tablet (20 mg total) by mouth daily. 90 tablet 3  . isosorbide mononitrate (IMDUR) 60 MG 24 hr tablet Take 1 tablet (60 mg total) by mouth daily. 90 tablet 1  . LORazepam (ATIVAN) 0.5 MG tablet Take 0.5 mg by mouth daily as needed for anxiety.    Marland Kitchen losartan (COZAAR) 100 MG tablet Take 1 tablet (100 mg total) by mouth daily. 30 tablet 2  . metoprolol (LOPRESSOR) 100 MG tablet Take one-half tablet (50 mg) by mouth twice daily    . Multiple Vitamin (MULTIVITAMIN WITH MINERALS) TABS tablet Take 1 tablet by mouth daily.    . niacin 100 MG tablet Take 100 mg by mouth 2 (two) times daily with a meal.    . nitroGLYCERIN (NITROSTAT) 0.3 MG SL tablet Place 1 tablet (0.3 mg total) under the tongue every 5 (five) minutes as needed for chest pain. 90 tablet 6  . Omega-3 Fatty Acids (FISH OIL) 1000 MG CPDR Take 2,000 mg by mouth 2 (two) times daily.     Marland Kitchen PRESCRIPTION MEDICATION Inhale into the lungs at bedtime. CPAP    . rivaroxaban (XARELTO) 20 MG TABS tablet Take 1 tablet (20 mg total) by mouth daily with supper. 30 tablet 6  . spironolactone (ALDACTONE) 25 MG tablet Take 1  tablet (25 mg total) by mouth daily. 90 tablet 3  . TURMERIC PO Take 2 tablets by mouth 2 (two) times daily.      No current facility-administered medications for this visit.     Allergies:   Contrast media [iodinated diagnostic agents]; Gluten meal; Tizanidine; Whey; and Morphine and related   Social History:  The patient  reports that he has never smoked. He has never used smokeless tobacco. He reports that he drinks about 1.2 oz of alcohol per week . He reports that he does not use drugs.   Family History:  The patient's family history includes Diabetes in his sister; Drug abuse in his father; Emphysema in his mother; Heart attack in his father, maternal grandfather, and paternal grandfather; Lung cancer in his mother; Stroke in his mother.    ROS:  Please see the history of present illness.   Otherwise, review of systems is positive for chest pain, chest pressure, headaches.   All other systems are reviewed and negative.    PHYSICAL EXAM: VS:  BP (!) 116/92   Pulse 62   Ht 5\' 11"  (1.803 m)   Wt 236 lb 9.6 oz (107.3 kg)   BMI 33.00 kg/m  , BMI Body mass index is 33 kg/m. GEN: Well nourished, well developed, in no acute distress  HEENT: normal  Neck: no JVD, carotid bruits, or masses Cardiac: RRR; no murmurs, rubs, or gallops,no edema  Respiratory:  clear to auscultation bilaterally, normal work of breathing GI: soft, nontender, nondistended, + BS MS: no deformity or atrophy  Skin: warm and dry Neuro:  Strength and sensation are intact Psych: euthymic mood, full affect  EKG:  EKG is not ordered today. Personal review of the ekg ordered 02/20/17 shows sinus rhythm, rate 56  Recent Labs: 01/04/2017: Magnesium 2.0 02/11/2017: ALT 36; BUN 31; Creatinine, Ser 1.19; Hemoglobin 15.2; NT-Pro BNP 532; Platelets 168; Potassium 3.8; Sodium 135; TSH 2.500    Lipid Panel     Component Value Date/Time   CHOL 94 (L) 08/15/2015 0912   TRIG 74 08/15/2015 0912   HDL 37 (L) 08/15/2015 0912    CHOLHDL 2.5 08/15/2015 0912   VLDL 15 08/15/2015 0912   LDLCALC 42 08/15/2015 0912     Wt Readings from Last 3 Encounters:  02/26/17 236 lb 9.6 oz (107.3 kg)  02/20/17 235 lb (106.6 kg)  02/13/17 235 lb 6.4 oz (106.8 kg)      Other studies Reviewed: Additional studies/ records that were reviewed today include: TTE 11/18/16  Review of the above records today demonstrates:  - Left ventricle: The cavity size was normal. There was severe   concentric hypertrophy. Systolic function was normal. The   estimated ejection fraction was in the range of 60% to 65%. Wall   motion was normal; there were no regional wall motion   abnormalities. The study is not technically sufficient to allow   evaluation of LV diastolic function. - Aortic valve: Trileaflet; mildly calcified leaflets. There was   mild regurgitation. - Mitral valve: Mildly thickened leaflets . There was trivial   regurgitation. - Left atrium: Moderately dilated. - Right ventricle: The cavity size was mildly dilated. - Right atrium: Severely dilated. - Tricuspid valve: There was trivial regurgitation. - Pulmonary arteries: PA peak pressure: 20 mm Hg (S). - Inferior vena cava: The vessel was normal in size. The   respirophasic diameter changes were in the normal range (= 50%),   consistent with normal central venous pressure.  LHC 11/13/16  Ost RPDA to RPDA lesion, 0 %stenosed.  Prox Cx to Mid Cx lesion, 0 %stenosed.  Prox LAD to Mid LAD lesion, 0 %stenosed.  Ost 1st Diag lesion, 80 %stenosed.  Mid RCA lesion, 60 %stenosed.  ASSESSMENT AND PLAN:  1.  Persistent atrial fibrillation: on Xarelto. Had repeat cardioversion 02/20/17.  Discussed medical options versus ablation. Risks and benefits of ablation were discussed. Risks include bleeding, tamponade, heart block, stroke, and damage to surrounding organs among others. We also discussed the possibility of switching medications to either Multaq or Tikosyn. Unfortunately  his QTC is 484 on his most recent EKG. It is unlikely this Tikosyn would be an optimal medication for him. We have sent her home with information on ablation and we Penne Rosenstock see him back in one month for further discussions.  This patients CHA2DS2-VASc  Score and unadjusted Ischemic Stroke Rate (% per year) is equal to 3.2 % stroke rate/year from a score of 3  Above score calculated as 1 point each if present [CHF, HTN, DM, Vascular=MI/PAD/Aortic Plaque, Age if 65-74, or Male] Above score calculated as 2 points each if present [Age > 75, or Stroke/TIA/TE]  2. Hypertension: Well-controlled today. No changes necessary.  3. Coronary artery disease: Currently he is having chest pain in the left side of his chest. He did have a stress test in January with ischemia at the apex as well as a heart catheterization with an 80% first diagonal lesion that was not intervened upon. He is a versus heart catheterizations as he had cardiac arrest during his initial intervention. We'll increase his Imdur to 120 mg.  4. Obstructive sleep apnea: encouraged CPAP compliance   Current medicines are reviewed at length with the patient today.   The patient does not have concerns regarding his medicines.  The following changes were made today:  Increase Imdur to 120 mg  Labs/ tests ordered today include:  No orders of the defined types were placed in this encounter.    Disposition:   FU with Calina Patrie 1 months  Signed, Corderro Koloski Meredith Leeds, MD  02/26/2017 11:26 AM     [facility] [address] [place] [address] [phone] (office) [phone] (fax)

## 2017-02-26 NOTE — Patient Instructions (Signed)
Medication Instructions:    Your physician recommends that you continue on your current medications as directed. Please refer to the Current Medication list given to you today.  Labwork:  None ordered  Testing/Procedures:  None ordered  Follow-Up:  Your physician recommends that you schedule a follow-up appointment in: 1 month with Dr. Curt Bears  - If you need a refill on your cardiac medications before your next appointment, please call your pharmacy.   Thank you for choosing [facility]!!   Trinidad Curet, RN [phone]   Any Other Special Instructions Will Be Listed Below (If Applicable).   Cardiac Ablation Cardiac ablation is a procedure to disable (ablate) a small amount of heart tissue in very specific places. The heart has many electrical connections. Sometimes these connections are abnormal and can cause the heart to beat very fast or irregularly. Ablating some of the problem areas can improve the heart rhythm or return it to normal. Ablation may be done for people who:  Have Wolff-Parkinson-White syndrome.  Have fast heart rhythms (tachycardia).  Have taken medicines for an abnormal heart rhythm (arrhythmia) that were not effective or caused side effects.  Have a high-risk heartbeat that may be life-threatening. During the procedure, a small incision is made in the neck or the groin, and a long, thin, flexible tube (catheter) is inserted into the incision and moved to the heart. Small devices (electrodes) on the tip of the catheter will send out electrical currents. A type of X-ray (fluoroscopy) will be used to help guide the catheter and to provide images of the heart. Tell a health care provider about:  Any allergies you have.  All medicines you are taking, including vitamins, herbs, eye drops, creams, and over-the-counter medicines.  Any problems you or family members have had with anesthetic medicines.  Any blood disorders you have.  Any surgeries you  have had.  Any medical conditions you have, such as kidney failure.  Whether you are pregnant or may be pregnant. What are the risks? Generally, this is a safe procedure. However, problems may occur, including:  Infection.  Bruising and bleeding at the catheter insertion site.  Bleeding into the chest, especially into the sac that surrounds the heart. This is a serious complication.  Stroke or blood clots.  Damage to other structures or organs.  Allergic reaction to medicines or dyes.  Need for a permanent pacemaker if the normal electrical system is damaged. A pacemaker is a small computer that sends electrical signals to the heart and helps your heart beat normally.  The procedure not being fully effective. This may not be recognized until months later. Repeat ablation procedures are sometimes required. What happens before the procedure?  Follow instructions from your health care provider about eating or drinking restrictions.  Ask your health care provider about:  Changing or stopping your regular medicines. This is especially important if you are taking diabetes medicines or blood thinners.  Taking medicines such as aspirin and ibuprofen. These medicines can thin your blood. Do not take these medicines before your procedure if your health care provider instructs you not to.  Plan to have someone take you home from the hospital or clinic.  If you will be going home right after the procedure, plan to have someone with you for 24 hours. What happens during the procedure?  To lower your risk of infection:  Your health care team will wash or sanitize their hands.  Your skin will be washed with soap.  Hair may  be removed from the incision area.  An IV tube will be inserted into one of your veins.  You will be given a medicine to help you relax (sedative).  The skin on your neck or groin will be numbed.  An incision will be made in your neck or your groin.  A needle  will be inserted through the incision and into a large vein in your neck or groin.  A catheter will be inserted into the needle and moved to your heart.  Dye may be injected through the catheter to help your surgeon see the area of the heart that needs treatment.  Electrical currents will be sent from the catheter to ablate heart tissue in desired areas. There are three types of energy that may be used to ablate heart tissue:  Heat (radiofrequency energy).  Laser energy.  Extreme cold (cryoablation).  When the necessary tissue has been ablated, the catheter will be removed.  Pressure will be held on the catheter insertion area to prevent excessive bleeding.  A bandage (dressing) will be placed over the catheter insertion area. The procedure may vary among health care providers and hospitals. What happens after the procedure?  Your blood pressure, heart rate, breathing rate, and blood oxygen level will be monitored until the medicines you were given have worn off.  Your catheter insertion area will be monitored for bleeding. You will need to lie still for a few hours to ensure that you do not bleed from the catheter insertion area.  Do not drive for 24 hours or as long as directed by your health care provider. Summary  Cardiac ablation is a procedure to disable (ablate) a small amount of heart tissue in very specific places. Ablating some of the problem areas can improve the heart rhythm or return it to normal.  During the procedure, electrical currents will be sent from the catheter to ablate heart tissue in desired areas. This information is not intended to replace advice given to you by your health care provider. Make sure you discuss any questions you have with your health care provider. Document Released: 03/15/2009 Document Revised: 09/15/2016 Document Reviewed: 09/15/2016 Elsevier Interactive Patient Education  2017 [employer].

## 2017-03-03 ENCOUNTER — Telehealth: Payer: Self-pay | Admitting: Internal Medicine

## 2017-03-03 ENCOUNTER — Ambulatory Visit: Payer: Medicare Other | Admitting: Internal Medicine

## 2017-03-03 NOTE — Telephone Encounter (Signed)
New Message:    Pt says he went in Atrial Fib last night and he is still in it. He thinks he might need to be seen.

## 2017-03-03 NOTE — Telephone Encounter (Signed)
Reviewed with Dr. [name] who advised that patient should take extra 50 mg metoprolol and extra 200 mg amiodarone now and if he gets more SOB he needs to go to the emergency department.   Informed the patient of this information.   He asked if he should still wait until next month's appointment to be seen again.  Advised if he makes a decision sooner regarding ablation he could call back to discuss possibly scheduling it.   He states he is leaning more toward medication (Tikosyn) if that is an option.  Advised I will forward this to Dr. Curt Bears to make him aware.

## 2017-03-03 NOTE — Telephone Encounter (Signed)
Call transferred to me in triage.  Pt reports sitting on his sofa last night noticed his HR was irregular, rate around 100.  He was "doing very little, keeping a low profile" since having last cardioversion on 4/13 and seeing Dr. Curt Bears on 4/13 to discuss ablation.  He is sweating profusely all night and this am, which is worse than normal.  He a little SOB.  Denies dizziness.  Taking Eliquis as directed.  (Both eliquis and xarelto are listed in his med list, he will begin xarelto when his current bottle of eliquis is completed).   He takes amiodarone 200 mg BID and lopressor 50 mg BID.  He is not otherwise ill, except maybe some pollen allergy symptoms.  Has not been taking allergy medications.  Per his Alive Cor monitor he is in afib 110-113.  His BP is 135/86.  Will review with Dr. [name] and call him back.

## 2017-03-05 NOTE — Telephone Encounter (Signed)
Informed pt that I would discuss with Dr. Curt Bears Monday, when he is back in the office, and call him w/ recommendation. Informed patient that Tikosyn may not work being that he already has a prolonged QT.  I will find out % chance of success w/ Tikosyn and call him next week. Patient verbalized understanding and agreeable to plan.

## 2017-03-05 NOTE — Telephone Encounter (Signed)
Follow Up:   Please call asap,pt says he have decided that he wants to try the medicine.

## 2017-03-10 NOTE — Telephone Encounter (Signed)
Informed pt low percentage of success w/ Tikosyn.  Other med option is Multaq and lastly ablation. Pt is going to research Multaq.  We agreed for me to call him by the end of the week to discuss his decision.

## 2017-03-16 ENCOUNTER — Encounter: Payer: Self-pay | Admitting: Cardiology

## 2017-03-16 NOTE — Telephone Encounter (Signed)
F/U  Patient is returning a call from [place].

## 2017-03-16 NOTE — Telephone Encounter (Signed)
Pt interested in ablation and also asking if he may stop Amiodarone b/e he "feels lousy since starting it". Will forward to Dr. Curt Bears for advisement on stopping medication.  Pt aware I will call him back tomorrow/Wednesday to schedule procedure and advise on Amiodarone. Patient verbalized understanding and agreeable to plan.

## 2017-03-16 NOTE — Telephone Encounter (Signed)
This encounter was created in error - please disregard.

## 2017-03-16 NOTE — Telephone Encounter (Signed)
Susann Givens K at 03/16/2017 10:31 AM   Status: Signed    F/U  Patient is returning a call from [place].

## 2017-03-17 NOTE — Telephone Encounter (Signed)
Can stop amiodarone but Troy [name] not be on any antiarrhythmic. May need ablation in the future.

## 2017-03-18 ENCOUNTER — Telehealth (HOSPITAL_COMMUNITY): Payer: Self-pay | Admitting: *Deleted

## 2017-03-18 NOTE — Telephone Encounter (Signed)
Reports SOB has been occurring for a long time, this is not new/recent. Reports angina 2 days ago and early this morning.  Took nitro x 2 this morning and pain relieved.  Pt has not experienced any further CP since.  This is not a new occurrence of CP for pt. Pt will continue to monitor and call if re-occurs or call 911 if needed.  Pt reports he has been taking Amiodarone BID since starting b/c that is what bottle said.  Informed pt that initial dosing, loading dose, is usually BID for several weeks to a month and then reduced to once daily. Dr. Curt Bears approved pt stopping Amiodarone, per pt request, but in light of finding out he has been taking too much for a while - pt wished to decrease to 200 mg once daily to see if improvement is made in the way he feels before stopping medication. He will call office if he decides to stop medication.  Pt agreeable to ablation procedure for 6/14. He understands I will call him today/tomorrow to review instructions.  Patient verbalized understanding and agreeable to plan.

## 2017-03-18 NOTE — Telephone Encounter (Signed)
Pt reports he has been taking Amiodarone BID since starting b/c that is what bottle said.  Informed pt that initial dosing, loading dose, is usually BID for several weeks to a month and then reduced to once daily. Dr. Curt Bears approved pt stopping Amiodarone, per pt request, but in light of finding out he has been taking too much for a while - pt wished to decrease to 200 mg once daily to see if improvement is made in the way he feels before stopping medication. He will call office if he decides to stop medication.  Pt agreeable to ablation procedure for 6/14. He understands I will call him today/tomorrow to review instructions.  Patient verbalized understanding and agreeable to plan.

## 2017-03-18 NOTE — Telephone Encounter (Signed)
Patient call was transferred here from [address] office -- pt stating he has not been feeling well. He is in NSR according to his [facility] monitor - but still extremely short of breath just walking to his car from his house, having chest pain (especially if his BP is over 150/90) last night he required 2 NTG tablets before CP subsided. He is still taking Amiodarone 200mg  twice a day. From chart review - appears pt has been in conversations with Sherri/[place] over plan of care. Instructed patient I will forward this information to [facility] for review and further recommendations. He is best reached at [phone]. Pt was appreciative of help.

## 2017-03-24 NOTE — Telephone Encounter (Signed)
Informed pt I would call him tomorrow.  Pt is agreeable

## 2017-03-25 NOTE — Telephone Encounter (Signed)
Pt is not sure that he want RFA at this time.  He will f/u next Wednesday (already scheduled) to discuss in more detail with Dr. Curt Bears. Pt aware I will not cancel procedure until he sees [place] next wee. Patient verbalized understanding and agreeable to plan.

## 2017-03-26 DIAGNOSIS — M25522 Pain in left elbow: Secondary | ICD-10-CM | POA: Diagnosis not present

## 2017-03-26 DIAGNOSIS — Z6834 Body mass index (BMI) 34.0-34.9, adult: Secondary | ICD-10-CM | POA: Diagnosis not present

## 2017-03-26 DIAGNOSIS — M24522 Contracture, left elbow: Secondary | ICD-10-CM | POA: Diagnosis not present

## 2017-03-27 DIAGNOSIS — M7712 Lateral epicondylitis, left elbow: Secondary | ICD-10-CM | POA: Diagnosis not present

## 2017-03-30 ENCOUNTER — Encounter: Payer: Self-pay | Admitting: Cardiology

## 2017-03-31 DIAGNOSIS — R29898 Other symptoms and signs involving the musculoskeletal system: Secondary | ICD-10-CM | POA: Diagnosis not present

## 2017-03-31 DIAGNOSIS — M7712 Lateral epicondylitis, left elbow: Secondary | ICD-10-CM | POA: Diagnosis not present

## 2017-03-31 DIAGNOSIS — M79632 Pain in left forearm: Secondary | ICD-10-CM | POA: Diagnosis not present

## 2017-03-31 DIAGNOSIS — M25639 Stiffness of unspecified wrist, not elsewhere classified: Secondary | ICD-10-CM | POA: Diagnosis not present

## 2017-04-01 ENCOUNTER — Encounter: Payer: Self-pay | Admitting: Cardiology

## 2017-04-01 ENCOUNTER — Ambulatory Visit (INDEPENDENT_AMBULATORY_CARE_PROVIDER_SITE_OTHER): Payer: Medicare Other | Admitting: Cardiology

## 2017-04-01 VITALS — BP 155/92 | HR 52 | Ht 71.0 in | Wt 245.6 lb

## 2017-04-01 DIAGNOSIS — I2510 Atherosclerotic heart disease of native coronary artery without angina pectoris: Secondary | ICD-10-CM

## 2017-04-01 DIAGNOSIS — I481 Persistent atrial fibrillation: Secondary | ICD-10-CM

## 2017-04-01 DIAGNOSIS — I10 Essential (primary) hypertension: Secondary | ICD-10-CM

## 2017-04-01 DIAGNOSIS — I200 Unstable angina: Secondary | ICD-10-CM

## 2017-04-01 DIAGNOSIS — I4819 Other persistent atrial fibrillation: Secondary | ICD-10-CM

## 2017-04-01 MED ORDER — ISOSORBIDE MONONITRATE ER 30 MG PO TB24: 30.0000 mg | ORAL_TABLET | Freq: Every day | ORAL | 1 refills | Status: DC

## 2017-04-01 NOTE — Patient Instructions (Addendum)
Medication Instructions:    We sent in for decreased dosage of Imdur, per your request.  30 mg tablets sent in  Labwork:  None ordered  Testing/Procedures:  None ordered  Follow-Up:  Your physician wants you to follow-up in: 6 months with Dr. Curt Bears.  You will receive a reminder letter in the mail two months in advance. If you don't receive a letter, please call our office to schedule the follow-up appointment.  - If you need a refill on your cardiac medications before your next appointment, please call your pharmacy.   Thank you for choosing [facility]!!   Trinidad Curet, RN [phone]

## 2017-04-01 NOTE — Progress Notes (Addendum)
Electrophysiology Office Note   Date:  04/01/2017   ID:  Troy [name], DOB 1951/10/31, MRN [id number]  PCP:  Velna Hatchet, MD  Cardiologist:  Radford Pax Primary Electrophysiologist:  Chealsey Miyamoto Meredith Leeds, MD    Chief Complaint  Patient presents with  . Follow-up    Persistent Afib     History of Present Illness: Troy [name] is a 66 y.o. male who is being seen today for the evaluation of atrial fibrillation at the request of Velna Hatchet, MD. Presenting today for electrophysiology evaluation. H/o 06/2015 DES mLAD &RPDA w/ VF arrest, PCI in [place] 2010, HTN, HLD, MR, PAF, pulm nodule stable 07/2016. He was recently loaded on amiodarone and  successfully cardioverted to SR around 50 bpm. Patient went back into AF on 02/13/17. His main complaint was shortness of breath. Had repeat cardioversion 02/20/17.   Since last being seen, he has been doing well. He stopped his amiodarone 2 weeks ago. He has not noticed many symptoms of atrial fibrillation. 10 days ago, he did have an episode of palpitations. He sat down and neck some water and the palpitations resolved on their own. His chest pain has improved. He currently takes Imdur, but he has been dividing his 90 mg dose in half and taking it twice a day. His shortness of breath is improved since stopping amiodarone.  Today, denies symptoms of palpitations, chest pain, orthopnea, PND, lower extremity edema, claudication, dizziness, presyncope, syncope, bleeding, or neurologic sequela. The patient is tolerating medications without difficulties and is otherwise without complaint today.     Past Medical History:  Diagnosis Date  . Arthritis    "knees" (01/02/2017)  . CAD (coronary artery disease)    a. s/p PCI in 2010 in [place]. b. Unstable angina/LHC 0/04/3015 complicated by V fib arrest after contrast injection. Cath 06/19/2015 s/p DES to mid LAD and RPDA.  Marland Kitchen Dyslipidemia   . Essential hypertension   . Lung nodule < 6cm on CT 06/16/15   Right lung  . Mitral regurgitation    a. Mild-mod by echo 06/2015.  . OSA on CPAP   . PAF (paroxysmal atrial fibrillation) ([place])    a. 06/2015 noted to be in new a-fib when arrived with unstable angina. Converted to NSR after being shocked in the cath lab for vfib;  b. 06/2015 Eliquis initiated as PAF noted on event monitor.  . Pulmonary nodule 07/2015   a. 1.5 x 1.2 cm smoothly marginated nodule in the central aspect of the right lower lobe with recommendation correlation with nonemergent PET-CT to exclude a neoplasm , stable by CT 07/2016  . Ventricular fibrillation ([place])    a. occured during cath 06/18/2015.   Past Surgical History:  Procedure Laterality Date  . CARDIAC CATHETERIZATION N/A 06/18/2015   Procedure: Left Heart Cath and Coronary Angiography;  Surgeon: Burnell Blanks, MD;  Location: [facility];  Service: Cardiovascular;  Laterality: N/A;  . CARDIAC CATHETERIZATION N/A 06/19/2015   Procedure: Coronary Stent Intervention;  Surgeon: Peter M Martinique, MD;  Location: [facility];  Service: Cardiovascular;  Laterality: N/A;  . CARDIAC CATHETERIZATION N/A 11/13/2016   Procedure: Left Heart Cath and Coronary Angiography;  Surgeon: Lorretta Harp, MD;  Location: [facility];  Service: Cardiovascular;  Laterality: N/A;  . CARDIOVERSION N/A 11/17/2016   Procedure: CARDIOVERSION;  Surgeon: Larey Dresser, MD;  Location: [facility];  Service: Cardiovascular;  Laterality: N/A;  . CARDIOVERSION N/A 01/03/2017   Procedure: CARDIOVERSION;  Surgeon: Denice Bors  Stanford Breed, MD;  Location: [facility];  Service: Cardiovascular;  Laterality: N/A;  . CARDIOVERSION N/A 02/05/2017   Procedure: Cardioversion;  Surgeon: Evans Lance, MD;  Location: [facility];  Service: Cardiovascular;  Laterality: N/A;  . CARDIOVERSION N/A 02/20/2017   Procedure: Cardioversion;  Surgeon: Evans Lance, MD;  Location: [facility];  Service: Cardiovascular;  Laterality: N/A;  . CARTILAGE SURGERY  Bilateral    "thumbs"  . CATARACT EXTRACTION W/ INTRAOCULAR LENS  IMPLANT, BILATERAL Bilateral   . CORONARY ANGIOPLASTY WITH STENT PLACEMENT  ~ 2007  . JOINT REPLACEMENT    . KNEE ARTHROSCOPY Right   . RETINAL DETACHMENT SURGERY Bilateral    "laser thing"  . TEE WITHOUT CARDIOVERSION N/A 11/17/2016   Procedure: TRANSESOPHAGEAL ECHOCARDIOGRAM (TEE);  Surgeon: Larey Dresser, MD;  Location: [facility];  Service: Cardiovascular;  Laterality: N/A;  . TOTAL KNEE ARTHROPLASTY Left 2000s     Current Outpatient Prescriptions  Medication Sig Dispense Refill  . atorvastatin (LIPITOR) 80 MG tablet Take 1 tablet (80 mg total) by mouth daily at 6 PM. 30 tablet 6  . furosemide (LASIX) 20 MG tablet Take 1 tablet (20 mg total) by mouth daily. 90 tablet 3  . isosorbide mononitrate (IMDUR) 30 MG 24 hr tablet Take 1 tablet (30 mg total) by mouth daily. 90 tablet 1  . LORazepam (ATIVAN) 0.5 MG tablet Take 0.5 mg by mouth daily as needed for anxiety.    Marland Kitchen losartan (COZAAR) 100 MG tablet Take 1 tablet (100 mg total) by mouth daily. 30 tablet 2  . metoprolol (LOPRESSOR) 100 MG tablet Take one-half tablet (50 mg) by mouth twice daily    . Multiple Vitamin (MULTIVITAMIN WITH MINERALS) TABS tablet Take 1 tablet by mouth daily.    . niacin 100 MG tablet Take 100 mg by mouth 2 (two) times daily with a meal.    . nitroGLYCERIN (NITROSTAT) 0.3 MG SL tablet Place 1 tablet (0.3 mg total) under the tongue every 5 (five) minutes as needed for chest pain. 90 tablet 6  . Omega-3 Fatty Acids (FISH OIL) 1000 MG CPDR Take 2,000 mg by mouth 2 (two) times daily.     Marland Kitchen PRESCRIPTION MEDICATION Inhale into the lungs at bedtime. CPAP    . rivaroxaban (XARELTO) 20 MG TABS tablet Take 1 tablet (20 mg total) by mouth daily with supper. 30 tablet 6  . spironolactone (ALDACTONE) 25 MG tablet Take 1 tablet (25 mg total) by mouth daily. 90 tablet 3  . TURMERIC PO Take 2 tablets by mouth 2 (two) times daily.      No current  facility-administered medications for this visit.     Allergies:   Contrast media [iodinated diagnostic agents]; Gluten meal; Metrizamide; Tizanidine; Whey; and Morphine and related   Social History:  The patient  reports that he has never smoked. He has never used smokeless tobacco. He reports that he drinks about 1.2 oz of alcohol per week . He reports that he does not use drugs.   Family History:  The patient's family history includes Diabetes in his sister; Drug abuse in his father; Emphysema in his mother; Heart attack in his father, maternal grandfather, and paternal grandfather; Lung cancer in his mother; Stroke in his mother.    ROS:  Please see the history of present illness.   Otherwise, review of systems is positive for SOB.   All other systems are reviewed and negative.   PHYSICAL EXAM: VS:   Vitals:  04/01/17 0908  BP: (!) 155/92  Pulse: (!) 52   GEN: Well nourished, well developed, in no acute distress  HEENT: normal  Neck: no JVD, carotid bruits, or masses Cardiac: RRR; no murmurs, rubs, or gallops,no edema  Respiratory:  clear to auscultation bilaterally, normal work of breathing GI: soft, nontender, nondistended, + BS MS: no deformity or atrophy  Skin: warm and dry Neuro:  Strength and sensation are intact Psych: euthymic mood, full affect  EKG:  EKG is ordered today. Personal review of the ekg ordered shows sinus rhythm, rate 52   Recent Labs: 01/04/2017: Magnesium 2.0 02/11/2017: ALT 36; BUN 31; Creatinine, Ser 1.19; Hemoglobin 15.2; NT-Pro BNP 532; Platelets 168; Potassium 3.8; Sodium 135; TSH 2.500    Lipid Panel     Component Value Date/Time   CHOL 94 (L) 08/15/2015 0912   TRIG 74 08/15/2015 0912   HDL 37 (L) 08/15/2015 0912   CHOLHDL 2.5 08/15/2015 0912   VLDL 15 08/15/2015 0912   LDLCALC 42 08/15/2015 0912     Wt Readings from Last 3 Encounters:  04/01/17 245 lb 9.6 oz (111.4 kg)  02/26/17 236 lb 9.6 oz (107.3 kg)  02/20/17 235 lb (106.6 kg)       Other studies Reviewed: Additional studies/ records that were reviewed today include: TTE 11/18/16  Review of the above records today demonstrates:  - Left ventricle: The cavity size was normal. There was severe   concentric hypertrophy. Systolic function was normal. The   estimated ejection fraction was in the range of 60% to 65%. Wall   motion was normal; there were no regional wall motion   abnormalities. The study is not technically sufficient to allow   evaluation of LV diastolic function. - Aortic valve: Trileaflet; mildly calcified leaflets. There was   mild regurgitation. - Mitral valve: Mildly thickened leaflets . There was trivial   regurgitation. - Left atrium: Moderately dilated. - Right ventricle: The cavity size was mildly dilated. - Right atrium: Severely dilated. - Tricuspid valve: There was trivial regurgitation. - Pulmonary arteries: PA peak pressure: 20 mm Hg (S). - Inferior vena cava: The vessel was normal in size. The   respirophasic diameter changes were in the normal range (= 50%),   consistent with normal central venous pressure.  LHC 11/13/16  Ost RPDA to RPDA lesion, 0 %stenosed.  Prox Cx to Mid Cx lesion, 0 %stenosed.  Prox LAD to Mid LAD lesion, 0 %stenosed.  Ost 1st Diag lesion, 80 %stenosed.  Mid RCA lesion, 60 %stenosed.  ASSESSMENT AND PLAN:  1.  Persistent atrial fibrillation: Currently on Xarelto with cardioversion on 02/20/17. Does not have complaints of atrial fibrillation since recent cardioversion. He is feeling well and would prefer not to have medical procedures performed at this time. Should he have more atrial fibrillation, would prefer ablation over further medical management.  This patients CHA2DS2-VASc Score and unadjusted Ischemic Stroke Rate (% per year) is equal to 3.2 % stroke rate/year from a score of 3  Above score calculated as 1 point each if present [CHF, HTN, DM, Vascular=MI/PAD/Aortic Plaque, Age if 65-74, or  Male] Above score calculated as 2 points each if present [Age > 75, or Stroke/TIA/TE]   2. Hypertension: Blood pressure is elevated, but is starting to come down on recheck to 146/88. Per the patient is usually within normal limits less than 135 at home. Continue to monitor.  3. Coronary artery disease with angina: Chest pain has improved with current management. He is taking  him door 45 mg twice a day. We'll continue his current dosage.  4. Obstructive sleep apnea: Encouraged compliance with CPAP   Current medicines are reviewed at length with the patient today.   The patient does not have concerns regarding his medicines.  The following changes were made today:    Labs/ tests ordered today include:  Orders Placed This Encounter  Procedures  . EKG 12-Lead     Disposition:   FU with Verl Kitson 6 months  Signed, Mena Simonis Meredith Leeds, MD  04/01/2017 9:38 AM     [facility] [address] [place] [address] [phone] (office) [phone] (fax)

## 2017-04-08 ENCOUNTER — Encounter: Payer: Self-pay | Admitting: *Deleted

## 2017-04-08 DIAGNOSIS — M7712 Lateral epicondylitis, left elbow: Secondary | ICD-10-CM | POA: Diagnosis not present

## 2017-04-08 DIAGNOSIS — M79632 Pain in left forearm: Secondary | ICD-10-CM | POA: Diagnosis not present

## 2017-04-08 DIAGNOSIS — R29898 Other symptoms and signs involving the musculoskeletal system: Secondary | ICD-10-CM | POA: Diagnosis not present

## 2017-04-08 DIAGNOSIS — M25639 Stiffness of unspecified wrist, not elsewhere classified: Secondary | ICD-10-CM | POA: Diagnosis not present

## 2017-04-13 NOTE — Addendum Note (Signed)
Addendum  created 04/13/17 1142 by Oleta Mouse, MD   Sign clinical note

## 2017-04-14 DIAGNOSIS — M7712 Lateral epicondylitis, left elbow: Secondary | ICD-10-CM | POA: Diagnosis not present

## 2017-04-14 DIAGNOSIS — M25639 Stiffness of unspecified wrist, not elsewhere classified: Secondary | ICD-10-CM | POA: Diagnosis not present

## 2017-04-14 DIAGNOSIS — R29898 Other symptoms and signs involving the musculoskeletal system: Secondary | ICD-10-CM | POA: Diagnosis not present

## 2017-04-14 DIAGNOSIS — M79632 Pain in left forearm: Secondary | ICD-10-CM | POA: Diagnosis not present

## 2017-04-21 DIAGNOSIS — M79632 Pain in left forearm: Secondary | ICD-10-CM | POA: Diagnosis not present

## 2017-04-21 DIAGNOSIS — M7712 Lateral epicondylitis, left elbow: Secondary | ICD-10-CM | POA: Diagnosis not present

## 2017-04-24 DIAGNOSIS — M7712 Lateral epicondylitis, left elbow: Secondary | ICD-10-CM | POA: Diagnosis not present

## 2017-04-24 DIAGNOSIS — M19022 Primary osteoarthritis, left elbow: Secondary | ICD-10-CM | POA: Diagnosis not present

## 2017-04-27 DIAGNOSIS — M7712 Lateral epicondylitis, left elbow: Secondary | ICD-10-CM | POA: Diagnosis not present

## 2017-04-27 DIAGNOSIS — M79632 Pain in left forearm: Secondary | ICD-10-CM | POA: Diagnosis not present

## 2017-04-28 NOTE — Progress Notes (Signed)
Cardiology Office Note    Date:  04/29/2017   ID:  Troy [name], DOB 07-05-1951, MRN [id number]  PCP:  Velna Hatchet, MD  Cardiologist:  Fransico Him, MD   Chief Complaint  Patient presents with  . Coronary Artery Disease  . Hypertension  . Atrial Fibrillation  . Hyperlipidemia    History of Present Illness:  Troy [name] is a 66 y.o. male with a history of ASCAD with DES mLAD & RPDA w/ VF arrest 06/2015, PCI in [place] 2010, HTN, HLD, MR, PAF (not anticoagulated in the past), pulm nodule stable 07/2016.  He had recurrent afib and was loaded with Amio and underwent TEE and cardioverted to NSR but then went back into afib 02/13/2017.  His amio was stopped and had repeat DCCV 02/20/2017. He was seen recently by Dr. Curt Bears with EP and stated that he does not wish to have any further DCCV if he goes back into afib and would prefer ablation.  He also has OSA and is doing well with his CPAP device. He was admitted in January with CP and nuclear stress test showed apical ischemia.  He underwent cath showing patent stents with 80% diag and 60% mid RCA.  He is here today for followup. He is diong well.  He denies any chest pain or pressure except if he takes the Imdur too late from his scheduled dose, SOB, DOE, PND, orthopnea, LE edema, dizziness, palpitations, claudication or syncope.     Past Medical History:  Diagnosis Date  . Arthritis    "knees" (01/02/2017)  . CAD (coronary artery disease)    a. s/p PCI in 2010 in [place]. b. Unstable angina/LHC 05/15/7340 complicated by V fib arrest after contrast injection. Cath 06/19/2015 s/p DES to mid LAD and RPDA.  Marland Kitchen Dyslipidemia   . Essential hypertension   . Lung nodule < 6cm on CT 06/16/15   Right lung  . Mitral regurgitation    a. Mild-mod by echo 06/2015.  . OSA on CPAP   . PAF (paroxysmal atrial fibrillation) ([place])    a. 06/2015 noted to be in new a-fib when arrived with unstable angina. Converted to NSR after being shocked in the cath lab  for vfib;  b. 06/2015 Eliquis initiated as PAF noted on event monitor.  . Pulmonary nodule 07/2015   a. 1.5 x 1.2 cm smoothly marginated nodule in the central aspect of the right lower lobe with recommendation correlation with nonemergent PET-CT to exclude a neoplasm , stable by CT 07/2016  . Ventricular fibrillation ([place])    a. occured during cath 06/18/2015.    Past Surgical History:  Procedure Laterality Date  . CARDIAC CATHETERIZATION N/A 06/18/2015   Procedure: Left Heart Cath and Coronary Angiography;  Surgeon: Burnell Blanks, MD;  Location: [facility];  Service: Cardiovascular;  Laterality: N/A;  . CARDIAC CATHETERIZATION N/A 06/19/2015   Procedure: Coronary Stent Intervention;  Surgeon: Peter M Martinique, MD;  Location: [facility];  Service: Cardiovascular;  Laterality: N/A;  . CARDIAC CATHETERIZATION N/A 11/13/2016   Procedure: Left Heart Cath and Coronary Angiography;  Surgeon: Lorretta Harp, MD;  Location: [facility];  Service: Cardiovascular;  Laterality: N/A;  . CARDIOVERSION N/A 11/17/2016   Procedure: CARDIOVERSION;  Surgeon: Larey Dresser, MD;  Location: [facility];  Service: Cardiovascular;  Laterality: N/A;  . CARDIOVERSION N/A 01/03/2017   Procedure: CARDIOVERSION;  Surgeon: Lelon Perla, MD;  Location: [facility];  Service: Cardiovascular;  Laterality: N/A;  .  CARDIOVERSION N/A 02/05/2017   Procedure: Cardioversion;  Surgeon: Evans Lance, MD;  Location: [facility];  Service: Cardiovascular;  Laterality: N/A;  . CARDIOVERSION N/A 02/20/2017   Procedure: Cardioversion;  Surgeon: Evans Lance, MD;  Location: [facility];  Service: Cardiovascular;  Laterality: N/A;  . CARTILAGE SURGERY Bilateral    "thumbs"  . CATARACT EXTRACTION W/ INTRAOCULAR LENS  IMPLANT, BILATERAL Bilateral   . CORONARY ANGIOPLASTY WITH STENT PLACEMENT  ~ 2007  . JOINT REPLACEMENT    . KNEE ARTHROSCOPY Right   . RETINAL DETACHMENT SURGERY Bilateral    "laser  thing"  . TEE WITHOUT CARDIOVERSION N/A 11/17/2016   Procedure: TRANSESOPHAGEAL ECHOCARDIOGRAM (TEE);  Surgeon: Larey Dresser, MD;  Location: [facility];  Service: Cardiovascular;  Laterality: N/A;  . TOTAL KNEE ARTHROPLASTY Left 2000s    Current Medications: Current Meds  Medication Sig  . atorvastatin (LIPITOR) 80 MG tablet Take 1 tablet (80 mg total) by mouth daily at 6 PM.  . furosemide (LASIX) 20 MG tablet Take 1 tablet (20 mg total) by mouth daily.  . isosorbide mononitrate (IMDUR) 30 MG 24 hr tablet Take 30 mg by mouth 2 (two) times daily.  Marland Kitchen LORazepam (ATIVAN) 0.5 MG tablet Take 0.5 mg by mouth daily as needed for anxiety.  Marland Kitchen losartan (COZAAR) 100 MG tablet Take 1 tablet (100 mg total) by mouth daily.  . metoprolol (LOPRESSOR) 100 MG tablet Take one-half tablet (50 mg) by mouth twice daily  . Multiple Vitamin (MULTIVITAMIN WITH MINERALS) TABS tablet Take 1 tablet by mouth daily.  . niacin 100 MG tablet Take 100 mg by mouth 2 (two) times daily with a meal.  . nitroGLYCERIN (NITROSTAT) 0.3 MG SL tablet Place 1 tablet (0.3 mg total) under the tongue every 5 (five) minutes as needed for chest pain.  . Omega-3 Fatty Acids (FISH OIL) 1000 MG CPDR Take 2,000 mg by mouth 2 (two) times daily.   Marland Kitchen PRESCRIPTION MEDICATION Inhale into the lungs at bedtime. CPAP  . rivaroxaban (XARELTO) 20 MG TABS tablet Take 1 tablet (20 mg total) by mouth daily with supper.  Marland Kitchen spironolactone (ALDACTONE) 25 MG tablet Take 1 tablet (25 mg total) by mouth daily.  . TURMERIC PO Take 2 tablets by mouth 2 (two) times daily.     Allergies:   Contrast media [iodinated diagnostic agents]; Gluten meal; Metrizamide; Tizanidine; Whey; and Morphine and related   Social History   Social History  . Marital status: Divorced    Spouse name: N/A  . Number of children: N/A  . Years of education: N/A   Social History Main Topics  . Smoking status: Never Smoker  . Smokeless tobacco: Never Used     Comment:  Second-hand exposure through parents  . Alcohol use 1.2 oz/week    2 Glasses of wine per week  . Drug use: No  . Sexual activity: Yes   Other Topics Concern  . None   Social History Narrative   Originally he is from [place], [place]. He moved to [facility] in 2013. He has a dog & cat at home. No bird or mold exposure. Has a hot tub but hasn't used it in 4 months. 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      Family History:  The patient's family history includes Diabetes in his sister; Drug abuse in his father; Emphysema in his mother; Heart attack in his father, maternal grandfather, and paternal grandfather; Lung cancer in his mother; Stroke in his mother.  ROS:   Please see the history of present illness.    ROS All other systems reviewed and are negative.  No flowsheet data found.     PHYSICAL EXAM:   VS:  BP 120/70   Pulse 63   Ht 5\' 11"  (1.803 m)   Wt 249 lb 9.6 oz (113.2 kg)   SpO2 96%   BMI 34.81 kg/m    GEN: Well nourished, well developed, in no acute distress  HEENT: normal  Neck: no JVD, carotid bruits, or masses Cardiac: RRR; no murmurs, rubs, or gallops,no edema.  Intact distal pulses bilaterally.  Respiratory:  clear to auscultation bilaterally, normal work of breathing GI: soft, nontender, nondistended, + BS MS: no deformity or atrophy  Skin: warm and dry, no rash Neuro:  Alert and Oriented x 3, Strength and sensation are intact Psych: euthymic mood, full affect  Wt Readings from Last 3 Encounters:  04/29/17 249 lb 9.6 oz (113.2 kg)  04/01/17 245 lb 9.6 oz (111.4 kg)  02/26/17 236 lb 9.6 oz (107.3 kg)      Studies/Labs Reviewed:   EKG:  EKG is not ordered today.    Recent Labs: 01/04/2017: Magnesium 2.0 02/11/2017: ALT 36; BUN 31; Creatinine, Ser 1.19; Hemoglobin 15.2; NT-Pro BNP 532; Platelets 168; Potassium 3.8; Sodium 135; TSH 2.500   Lipid Panel    Component Value Date/Time   CHOL 94 (L) 08/15/2015 0912   TRIG 74 08/15/2015 0912   HDL 37 (L) 08/15/2015  0912   CHOLHDL 2.5 08/15/2015 0912   VLDL 15 08/15/2015 0912   LDLCALC 42 08/15/2015 0912    Additional studies/ records that were reviewed today include:  Cath report    ASSESSMENT:    1. Coronary artery disease of native artery of transplanted heart with stable angina pectoris ([place])   2. Persistent atrial fibrillation with RVR ([place]), successful TEE DCCV 11/17/16   3. Essential (primary) hypertension   4. Dyslipidemia      PLAN:  In order of problems listed above:  1.  ASCAD - s/p DES mLAD & RPDA w/ VF arrest 06/2015, PCI in [place] 2010 and recent cath 11/2016 with patent grafts and 80% D1 and 60% RCA.  He has not had any further anginal CP unless he is late on taking his long acting nitrate.  He will continue on Imdur 30mg  BID, Lopressor 50mg  BID and statin. He is not on ASA due to NOAC.  2.  Persistent atrial fibrillation - he is maintaining NSR.  He will continue on Lopressor 25mg  BID and Xarelto 20mg  daily.  His last BMET and CBC were stable 02/2017.  3.  HTN - His BP is well controlled on exam today.  He will continue on aldactone 25mg  daily, lopressor 25mg  BID, Losartan 100mg  daily  4.  Dyslipidemia with LDL goal < 70. He will continue on atorvastatin 80mg  daily.  I will check an FLP and ALT.    5.  Severe hypertrophy of the IVS on echo with Septal wall thickness of 15.5mm.  His wall thickness was normal 2 years ago so doubt this is HOCM and likely related to HTN.  He does not have any other findings of HOCM on echo.  I will get a cardiac MRI to evaluate further.  He has no history of syncope but his father did have SCD (he did drugs).  6.  OSA - the patient is tolerating PAP therapy well without any problems. The patient has been using and benefiting from CPAP use  and will continue to benefit from therapy. I will get a download from his DME.    Medication Adjustments/Labs and Tests Ordered: Current medicines are reviewed at length with the patient today.  Concerns regarding  medicines are outlined above.  Medication changes, Labs and Tests ordered today are listed in the Patient Instructions below.  There are no Patient Instructions on file for this visit.   Signed, Fransico Him, MD  04/29/2017 10:14 AM    [address], [place], [place]  [address] Phone: [phone]; Fax: [phone]

## 2017-04-29 ENCOUNTER — Encounter: Payer: Self-pay | Admitting: Cardiology

## 2017-04-29 ENCOUNTER — Ambulatory Visit (INDEPENDENT_AMBULATORY_CARE_PROVIDER_SITE_OTHER): Payer: Medicare Other | Admitting: Cardiology

## 2017-04-29 ENCOUNTER — Telehealth: Payer: Self-pay | Admitting: *Deleted

## 2017-04-29 VITALS — BP 120/70 | HR 63 | Ht 71.0 in | Wt 249.6 lb

## 2017-04-29 DIAGNOSIS — I517 Cardiomegaly: Secondary | ICD-10-CM | POA: Diagnosis not present

## 2017-04-29 DIAGNOSIS — I10 Essential (primary) hypertension: Secondary | ICD-10-CM | POA: Diagnosis not present

## 2017-04-29 DIAGNOSIS — I200 Unstable angina: Secondary | ICD-10-CM | POA: Diagnosis not present

## 2017-04-29 DIAGNOSIS — I209 Angina pectoris, unspecified: Secondary | ICD-10-CM | POA: Diagnosis not present

## 2017-04-29 DIAGNOSIS — E785 Hyperlipidemia, unspecified: Secondary | ICD-10-CM

## 2017-04-29 DIAGNOSIS — I481 Persistent atrial fibrillation: Secondary | ICD-10-CM | POA: Diagnosis not present

## 2017-04-29 DIAGNOSIS — I4819 Other persistent atrial fibrillation: Secondary | ICD-10-CM

## 2017-04-29 DIAGNOSIS — I25758 Atherosclerosis of native coronary artery of transplanted heart with other forms of angina pectoris: Secondary | ICD-10-CM

## 2017-04-29 MED ORDER — ISOSORBIDE MONONITRATE ER 30 MG PO TB24: 30.0000 mg | ORAL_TABLET | Freq: Two times a day (BID) | ORAL | 3 refills | Status: DC

## 2017-04-29 NOTE — Telephone Encounter (Signed)
Download recieved

## 2017-04-29 NOTE — Patient Instructions (Signed)
Medication Instructions:  Your physician recommends that you continue on your current medications as directed. Please refer to the Current Medication list given to you today.   Labwork: Your physician recommends that you return for FASTING lab work [place].  Testing/Procedures: Dr. Radford Pax recommends you have a CARDIAC MRI.  Follow-Up: Your physician wants you to follow-up in: 6 months with Dr. Theodosia Blender assistant. You will receive a reminder letter in the mail two months in advance. If you don't receive a letter, please call our office to schedule the follow-up appointment.   Your physician wants you to follow-up in: 1 year with Dr. Radford Pax. You will receive a reminder letter in the mail two months in advance. If you don't receive a letter, please call our office to schedule the follow-up appointment.   Any Other Special Instructions Will Be Listed Below (If Applicable).     If you need a refill on your cardiac medications before your next appointment, please call your pharmacy.

## 2017-04-29 NOTE — Telephone Encounter (Signed)
-----   Message from Theodoro Parma, RN sent at 04/29/2017 11:09 AM EDT ----- Regarding: download Please request download from [place] [facility]  Thank you

## 2017-04-30 ENCOUNTER — Encounter: Payer: Self-pay | Admitting: Cardiology

## 2017-05-11 ENCOUNTER — Telehealth: Payer: Self-pay | Admitting: *Deleted

## 2017-05-11 NOTE — Telephone Encounter (Signed)
Return Call: Informed patient of compliance results and patient understanding was verbalized. Patient understands he needs to improve compliance. Patient explains he has been traveling and does not taken his cpap with him because of the inconvenience. Patient states he will be more compliant now that he will not be traveling for a while.

## 2017-05-11 NOTE — Telephone Encounter (Signed)
-----   Message from Sueanne Margarita, MD sent at 05/02/2017  7:46 PM EDT ----- Good AHI on CPAP but needs to improve compliance

## 2017-05-12 ENCOUNTER — Other Ambulatory Visit: Payer: Medicare Other | Admitting: *Deleted

## 2017-05-12 DIAGNOSIS — I4819 Other persistent atrial fibrillation: Secondary | ICD-10-CM

## 2017-05-12 DIAGNOSIS — I481 Persistent atrial fibrillation: Secondary | ICD-10-CM | POA: Diagnosis not present

## 2017-05-12 DIAGNOSIS — I10 Essential (primary) hypertension: Secondary | ICD-10-CM

## 2017-05-12 DIAGNOSIS — E785 Hyperlipidemia, unspecified: Secondary | ICD-10-CM

## 2017-05-12 LAB — LIPID PANEL
CHOLESTEROL TOTAL: 223 mg/dL — AB (ref 100–199)
Chol/HDL Ratio: 5.3 ratio — ABNORMAL HIGH (ref 0.0–5.0)
HDL: 42 mg/dL (ref >39)
LDL CALC: 151 mg/dL — AB (ref 0–99)
TRIGLYCERIDES: 152 mg/dL — AB (ref 0–149)
VLDL Cholesterol Cal: 30 mg/dL (ref 5–40)

## 2017-05-12 LAB — HEPATIC FUNCTION PANEL
ALK PHOS: 45 IU/L (ref 39–117)
ALT: 33 IU/L (ref 0–44)
AST: 31 IU/L (ref 0–40)
Albumin: 4.5 g/dL (ref 3.6–4.8)
Bilirubin Total: 0.4 mg/dL (ref 0.0–1.2)
Bilirubin, Direct: 0.13 mg/dL (ref 0.00–0.40)
TOTAL PROTEIN: 6.8 g/dL (ref 6.0–8.5)

## 2017-05-12 LAB — BASIC METABOLIC PANEL
BUN / CREAT RATIO: 17 (ref 10–24)
BUN: 24 mg/dL (ref 8–27)
CHLORIDE: 103 mmol/L (ref 96–106)
CO2: 21 mmol/L (ref 20–29)
CREATININE: 1.38 mg/dL — AB (ref 0.76–1.27)
Calcium: 9.5 mg/dL (ref 8.6–10.2)
GFR calc non Af Amer: 53 mL/min/{1.73_m2} — ABNORMAL LOW (ref >59)
GFR, EST AFRICAN AMERICAN: 61 mL/min/{1.73_m2} (ref >59)
GLUCOSE: 113 mg/dL — AB (ref 65–99)
Potassium: 4.6 mmol/L (ref 3.5–5.2)
SODIUM: 140 mmol/L (ref 134–144)

## 2017-05-14 ENCOUNTER — Telehealth: Payer: Self-pay | Admitting: *Deleted

## 2017-05-14 DIAGNOSIS — Z79899 Other long term (current) drug therapy: Secondary | ICD-10-CM

## 2017-05-14 MED ORDER — EZETIMIBE 10 MG PO TABS: 10.0000 mg | ORAL_TABLET | Freq: Every day | ORAL | 3 refills | Status: DC

## 2017-05-14 NOTE — Telephone Encounter (Signed)
-----   Message from Sueanne Margarita, MD sent at 05/12/2017  9:32 PM EDT ----- LDL not at goal - add Zetia 10mg  daily and repeat FLP and ALT in 6 weeks.  Creatinine mildly elevated.  Please repeat in 2 weeks

## 2017-05-18 ENCOUNTER — Ambulatory Visit (HOSPITAL_COMMUNITY)
Admission: RE | Admit: 2017-05-18 | Discharge: 2017-05-18 | Disposition: A | Discharge status: Home / Self Care | Payer: Medicare Other | Source: Ambulatory Visit | Attending: Cardiology | Admitting: Cardiology

## 2017-05-18 ENCOUNTER — Encounter (HOSPITAL_COMMUNITY): Payer: Self-pay

## 2017-05-18 DIAGNOSIS — I517 Cardiomegaly: Secondary | ICD-10-CM

## 2017-05-19 ENCOUNTER — Telehealth: Payer: Self-pay

## 2017-05-19 DIAGNOSIS — Q210 Ventricular septal defect: Secondary | ICD-10-CM

## 2017-05-19 NOTE — Telephone Encounter (Signed)
To Dr. Radford Pax for further instruction as MRI could not be completed.

## 2017-05-19 NOTE — Telephone Encounter (Signed)
Patient agrees to repeat ECHO in 1 year. Order placed for scheduling. Patient was grateful for call.

## 2017-05-19 NOTE — Telephone Encounter (Signed)
Nothing else we can do at this time.  Repeat echo in 1 year.  Patient needs to let us know if he has any syncope

## 2017-05-19 NOTE — Telephone Encounter (Signed)
-----   Message from Corinna Lines sent at 05/18/2017 12:58 PM EDT ----- Regarding: MRI   Katie  Just an FYI that this patient could not do his MRI due to severe anxiety even with medication.  [facility]

## 2017-05-21 ENCOUNTER — Ambulatory Visit (HOSPITAL_COMMUNITY)
Admission: EM | Admit: 2017-05-21 | Discharge: 2017-05-21 | Disposition: A | Discharge status: Home / Self Care | Payer: Medicare Other | Attending: Emergency Medicine | Admitting: Emergency Medicine

## 2017-05-21 ENCOUNTER — Encounter (HOSPITAL_COMMUNITY): Payer: Self-pay | Admitting: Emergency Medicine

## 2017-05-21 DIAGNOSIS — R1011 Right upper quadrant pain: Secondary | ICD-10-CM | POA: Diagnosis not present

## 2017-05-21 DIAGNOSIS — S39011A Strain of muscle, fascia and tendon of abdomen, initial encounter: Secondary | ICD-10-CM | POA: Diagnosis not present

## 2017-05-21 MED ORDER — IBUPROFEN 800 MG PO TABS: 800.0000 mg | ORAL_TABLET | Freq: Three times a day (TID) | ORAL | 0 refills | Status: DC

## 2017-05-21 NOTE — ED Triage Notes (Signed)
Pt complains of RUQ swelling and pain since Monday.

## 2017-05-21 NOTE — Discharge Instructions (Signed)
Hold herbal medicine while taking motrin

## 2017-05-21 NOTE — ED Provider Notes (Signed)
CSN: [id number]     Arrival date & time 05/21/17  1029 History   None    Chief Complaint  Patient presents with  . Abdominal Pain    RUQ   (Consider location/radiation/quality/duration/timing/severity/associated sxs/prior Treatment) Patient c/o right abdominal pain.  The pain hurts with movement.  Patient states he had to lie on MRI table for an extended time recently.   The history is provided by the patient.  Abdominal Pain  Pain location:  RUQ Pain quality: aching   Pain radiates to:  Does not radiate Pain severity:  Moderate Onset quality:  Sudden Duration:  1 day Timing:  Constant Progression:  Worsening Relieved by:  Nothing Worsened by:  Nothing Ineffective treatments:  None tried   Past Medical History:  Diagnosis Date  . Arthritis    "knees" (01/02/2017)  . CAD (coronary artery disease)    a. s/p PCI in 2010 in [place]. b. Unstable angina/LHC 03/11/7781 complicated by V fib arrest after contrast injection. Cath 06/19/2015 s/p DES to mid LAD and RPDA.  Marland Kitchen Dyslipidemia   . Essential hypertension   . Lung nodule < 6cm on CT 06/16/15   Right lung  . Mitral regurgitation    a. Mild-mod by echo 06/2015.  . OSA on CPAP   . PAF (paroxysmal atrial fibrillation) ([place])    a. 06/2015 noted to be in new a-fib when arrived with unstable angina. Converted to NSR after being shocked in the cath lab for vfib;  b. 06/2015 Eliquis initiated as PAF noted on event monitor.  . Pulmonary nodule 07/2015   a. 1.5 x 1.2 cm smoothly marginated nodule in the central aspect of the right lower lobe with recommendation correlation with nonemergent PET-CT to exclude a neoplasm , stable by CT 07/2016  . Ventricular fibrillation ([place])    a. occured during cath 06/18/2015.   Past Surgical History:  Procedure Laterality Date  . CARDIAC CATHETERIZATION N/A 06/18/2015   Procedure: Left Heart Cath and Coronary Angiography;  Surgeon: Burnell Blanks, MD;  Location: [place] [facility];  Service:  Cardiovascular;  Laterality: N/A;  . CARDIAC CATHETERIZATION N/A 06/19/2015   Procedure: Coronary Stent Intervention;  Surgeon: Peter M Martinique, MD;  Location: [place] [facility];  Service: Cardiovascular;  Laterality: N/A;  . CARDIAC CATHETERIZATION N/A 11/13/2016   Procedure: Left Heart Cath and Coronary Angiography;  Surgeon: Lorretta Harp, MD;  Location: [place] [facility];  Service: Cardiovascular;  Laterality: N/A;  . CARDIOVERSION N/A 11/17/2016   Procedure: CARDIOVERSION;  Surgeon: Larey Dresser, MD;  Location: [facility] ENDOSCOPY;  Service: Cardiovascular;  Laterality: N/A;  . CARDIOVERSION N/A 01/03/2017   Procedure: CARDIOVERSION;  Surgeon: Lelon Perla, MD;  Location: [place];  Service: Cardiovascular;  Laterality: N/A;  . CARDIOVERSION N/A 02/05/2017   Procedure: Cardioversion;  Surgeon: Evans Lance, MD;  Location: [place] [facility];  Service: Cardiovascular;  Laterality: N/A;  . CARDIOVERSION N/A 02/20/2017   Procedure: Cardioversion;  Surgeon: Evans Lance, MD;  Location: [place] [facility];  Service: Cardiovascular;  Laterality: N/A;  . CARTILAGE SURGERY Bilateral    "thumbs"  . CATARACT EXTRACTION W/ INTRAOCULAR LENS  IMPLANT, BILATERAL Bilateral   . CORONARY ANGIOPLASTY WITH STENT PLACEMENT  ~ 2007  . JOINT REPLACEMENT    . KNEE ARTHROSCOPY Right   . RETINAL DETACHMENT SURGERY Bilateral    "laser thing"  . TEE WITHOUT CARDIOVERSION N/A 11/17/2016   Procedure: TRANSESOPHAGEAL ECHOCARDIOGRAM (TEE);  Surgeon: Larey Dresser, MD;  Location: [facility] ENDOSCOPY;  Service: Cardiovascular;  Laterality: N/A;  . TOTAL KNEE ARTHROPLASTY Left 2000s   Family History  Problem Relation Age of Onset  . Stroke Mother   . Emphysema Mother   . Lung cancer Mother   . Heart attack Father   . Drug abuse Father   . Heart attack Maternal Grandfather   . Heart attack Paternal Grandfather   . Diabetes Sister    Social History  Substance Use Topics  . Smoking status: Never Smoker  . Smokeless  tobacco: Never Used     Comment: Second-hand exposure through parents  . Alcohol use 1.2 oz/week    2 Glasses of wine per week    Review of Systems  Constitutional: Negative.   HENT: Negative.   Eyes: Negative.   Respiratory: Negative.   Cardiovascular: Negative.   Gastrointestinal: Positive for abdominal pain.  Endocrine: Negative.   Genitourinary: Negative.   Musculoskeletal: Negative.   Allergic/Immunologic: Negative.   Neurological: Negative.   Hematological: Negative.   Psychiatric/Behavioral: Negative.     Allergies  Contrast media [iodinated diagnostic agents]; Gluten meal; Metrizamide; Tizanidine; Whey; and Morphine and related  Home Medications   Prior to Admission medications   Medication Sig Start Date End Date Taking? Authorizing Provider  atorvastatin (LIPITOR) 80 MG tablet Take 1 tablet (80 mg total) by mouth daily at 6 PM. 11/17/16   Isaiah Serge, NP  ezetimibe (ZETIA) 10 MG tablet Take 1 tablet (10 mg total) by mouth daily. 05/14/17 08/12/17  Sueanne Margarita, MD  furosemide (LASIX) 20 MG tablet Take 1 tablet (20 mg total) by mouth daily. 02/12/17 05/13/17  Burtis Junes, NP  ibuprofen (ADVIL,MOTRIN) 800 MG tablet Take 1 tablet (800 mg total) by mouth 3 (three) times daily. 05/21/17   Lysbeth Penner, FNP  isosorbide mononitrate (IMDUR) 30 MG 24 hr tablet Take 1 tablet (30 mg total) by mouth 2 (two) times daily. 04/29/17   Sueanne Margarita, MD  LORazepam (ATIVAN) 0.5 MG tablet Take 0.5 mg by mouth daily as needed for anxiety.    [provider]  losartan (COZAAR) 100 MG tablet Take 1 tablet (100 mg total) by mouth daily. 01/15/17   Sherran Needs, NP  metoprolol (LOPRESSOR) 100 MG tablet Take one-half tablet (50 mg) by mouth twice daily    [provider]  Multiple Vitamin (MULTIVITAMIN WITH MINERALS) TABS tablet Take 1 tablet by mouth daily.    [provider]  niacin 100 MG tablet Take 100 mg by mouth 2 (two) times daily with a meal.     [provider]  nitroGLYCERIN (NITROSTAT) 0.3 MG SL tablet Place 1 tablet (0.3 mg total) under the tongue every 5 (five) minutes as needed for chest pain. 01/12/17   Sherran Needs, NP  Omega-3 Fatty Acids (FISH OIL) 1000 MG CPDR Take 2,000 mg by mouth 2 (two) times daily.     [provider]  PRESCRIPTION MEDICATION Inhale into the lungs at bedtime. CPAP    [provider]  rivaroxaban (XARELTO) 20 MG TABS tablet Take 1 tablet (20 mg total) by mouth daily with supper. 02/11/17   Burtis Junes, NP  spironolactone (ALDACTONE) 25 MG tablet Take 1 tablet (25 mg total) by mouth daily. 01/04/17   Strader, Fransisco Hertz, PA-C  TURMERIC PO Take 2 tablets by mouth 2 (two) times daily.     [provider]   Meds Ordered and Administered this Visit  Medications - No data  to display  BP (!) 148/88 (BP Location: Left Arm)   Pulse 64   Temp 99.3 F (37.4 C) (Oral)   SpO2 96%  No data found.   Physical Exam  Constitutional: He appears well-developed and well-nourished.  HENT:  Head: Normocephalic and atraumatic.  Eyes: Pupils are equal, round, and reactive to light. Conjunctivae and EOM are normal.  Neck: Normal range of motion. Neck supple.  Cardiovascular: Normal rate, regular rhythm and normal heart sounds.   Pulmonary/Chest: Effort normal and breath sounds normal.  Abdominal: Soft. There is tenderness.  Tender right upper quadrant.  Negative Murphy's.  Pain is with positioning.  Nursing note and vitals reviewed.   Urgent Care Course     Procedures (including critical care time)  Labs Review Labs Reviewed - No data to display  Imaging Review No results found.   Visual Acuity Review  Right Eye Distance:   Left Eye Distance:   Bilateral Distance:    Right Eye Near:   Left Eye Near:    Bilateral Near:         MDM   1. Strain of abdominal muscle, initial encounter    Motrin 800 mg one po tid x 7 days #21      Latif, Naz[place]o,   05/21/17 1703

## 2017-06-26 ENCOUNTER — Other Ambulatory Visit: Payer: Medicare Other

## 2017-07-21 ENCOUNTER — Other Ambulatory Visit (HOSPITAL_COMMUNITY): Payer: Self-pay | Admitting: Family Medicine

## 2017-07-22 DIAGNOSIS — M1711 Unilateral primary osteoarthritis, right knee: Secondary | ICD-10-CM | POA: Diagnosis not present

## 2017-07-27 ENCOUNTER — Other Ambulatory Visit: Payer: Medicare Other | Admitting: *Deleted

## 2017-07-27 DIAGNOSIS — Z79899 Other long term (current) drug therapy: Secondary | ICD-10-CM | POA: Diagnosis not present

## 2017-07-27 LAB — HEPATIC FUNCTION PANEL
ALT: 26 IU/L (ref 0–44)
AST: 21 IU/L (ref 0–40)
Albumin: 4.8 g/dL (ref 3.6–4.8)
Alkaline Phosphatase: 37 IU/L — ABNORMAL LOW (ref 39–117)
BILIRUBIN TOTAL: 0.5 mg/dL (ref 0.0–1.2)
BILIRUBIN, DIRECT: 0.13 mg/dL (ref 0.00–0.40)
TOTAL PROTEIN: 7.1 g/dL (ref 6.0–8.5)

## 2017-07-27 LAB — LIPID PANEL
CHOL/HDL RATIO: 4.4 ratio (ref 0.0–5.0)
Cholesterol, Total: 188 mg/dL (ref 100–199)
HDL: 43 mg/dL (ref >39)
LDL CALC: 119 mg/dL — AB (ref 0–99)
TRIGLYCERIDES: 131 mg/dL (ref 0–149)
VLDL Cholesterol Cal: 26 mg/dL (ref 5–40)

## 2017-07-30 ENCOUNTER — Ambulatory Visit (INDEPENDENT_AMBULATORY_CARE_PROVIDER_SITE_OTHER): Payer: Medicare Other | Admitting: Pulmonary Disease

## 2017-07-30 ENCOUNTER — Encounter: Payer: Self-pay | Admitting: Pulmonary Disease

## 2017-07-30 VITALS — BP 134/72 | HR 108 | Ht 71.0 in | Wt 238.0 lb

## 2017-07-30 DIAGNOSIS — R911 Solitary pulmonary nodule: Secondary | ICD-10-CM

## 2017-07-30 DIAGNOSIS — IMO0001 Reserved for inherently not codable concepts without codable children: Secondary | ICD-10-CM

## 2017-07-30 DIAGNOSIS — I200 Unstable angina: Secondary | ICD-10-CM

## 2017-07-30 NOTE — Patient Instructions (Signed)
   Please call our office if you have any new breathing problems or questions.  We will be happy to see you back as needed.

## 2017-07-30 NOTE — Progress Notes (Signed)
Subjective:    Patient ID: Troy [name], male    DOB: 24-Dec-1950, 66 y.o.   MRN: [id number]  C.C.:  Follow-up for Right Lung Nodule.  HPI Patient last seen January 2017.  Right lung nodule: First noted on CT angiogram of the chest in August 2016. PET/CT imaging in September 2016 had no hypermetabolic activity. He reports he is unable to do further CT imaging due to claustrophobia. Last CT was in September of 2017 without signs of enlargement. No chest pain or pressure.   Review of Systems No dyspnea or coughing. No abdominal pain or nausea. No fever or chills.   Allergies  Allergen Reactions  . Contrast Media [Iodinated Diagnostic Agents] Other (See Comments)    V-Fib arrest during cardiac cath suspected d/t contrast dye in 2016  . Gluten Meal Other (See Comments)    REACTION: Celiac disease (severe stomach pain)  . Metrizamide Other (See Comments)    V-Fib arrest during cardiac cath suspected d/t contrast dye in 2016  . Tizanidine Anxiety    Depression  . Whey Other (See Comments)    REACTION: Celiac disease (severe stomach pain)  . Morphine And Related Other (See Comments)    REACTION: "REALLY BAD STOMACH PAIN"   Past Medical History:  Diagnosis Date  . Arthritis    "knees" (01/02/2017)  . CAD (coronary artery disease)    a. s/p PCI in 2010 in [place]. b. Unstable angina/LHC 12/15/5807 complicated by V fib arrest after contrast injection. Cath 06/19/2015 s/p DES to mid LAD and RPDA.  Marland Kitchen Dyslipidemia   . Essential hypertension   . Lung nodule < 6cm on CT 06/16/15   Right lung  . Mitral regurgitation    a. Mild-mod by echo 06/2015.  . OSA on CPAP   . PAF (paroxysmal atrial fibrillation) ([place])    a. 06/2015 noted to be in new a-fib when arrived with unstable angina. Converted to NSR after being shocked in the cath lab for vfib;  b. 06/2015 Eliquis initiated as PAF noted on event monitor.  . Pulmonary nodule 07/2015   a. 1.5 x 1.2 cm smoothly marginated nodule in the central  aspect of the right lower lobe with recommendation correlation with nonemergent PET-CT to exclude a neoplasm , stable by CT 07/2016  . Ventricular fibrillation ([place])    a. occured during cath 06/18/2015.   Past Surgical History:  Procedure Laterality Date  . CARDIAC CATHETERIZATION N/A 06/18/2015   Procedure: Left Heart Cath and Coronary Angiography;  Surgeon: Burnell Blanks, MD;  Location: [place] [facility];  Service: Cardiovascular;  Laterality: N/A;  . CARDIAC CATHETERIZATION N/A 06/19/2015   Procedure: Coronary Stent Intervention;  Surgeon: Peter M Martinique, MD;  Location: [place] [facility];  Service: Cardiovascular;  Laterality: N/A;  . CARDIAC CATHETERIZATION N/A 11/13/2016   Procedure: Left Heart Cath and Coronary Angiography;  Surgeon: Lorretta Harp, MD;  Location: [place] [facility];  Service: Cardiovascular;  Laterality: N/A;  . CARDIOVERSION N/A 11/17/2016   Procedure: CARDIOVERSION;  Surgeon: Larey Dresser, MD;  Location: [facility] ENDOSCOPY;  Service: Cardiovascular;  Laterality: N/A;  . CARDIOVERSION N/A 01/03/2017   Procedure: CARDIOVERSION;  Surgeon: Lelon Perla, MD;  Location: [place];  Service: Cardiovascular;  Laterality: N/A;  . CARDIOVERSION N/A 02/05/2017   Procedure: Cardioversion;  Surgeon: Evans Lance, MD;  Location: [place] [facility];  Service: Cardiovascular;  Laterality: N/A;  . CARDIOVERSION N/A 02/20/2017   Procedure: Cardioversion;  Surgeon: Evans Lance,  MD;  Location: [place] [facility];  Service: Cardiovascular;  Laterality: N/A;  . CARTILAGE SURGERY Bilateral    "thumbs"  . CATARACT EXTRACTION W/ INTRAOCULAR LENS  IMPLANT, BILATERAL Bilateral   . CORONARY ANGIOPLASTY WITH STENT PLACEMENT  ~ 2007  . JOINT REPLACEMENT    . KNEE ARTHROSCOPY Right   . RETINAL DETACHMENT SURGERY Bilateral    "laser thing"  . TEE WITHOUT CARDIOVERSION N/A 11/17/2016   Procedure: TRANSESOPHAGEAL ECHOCARDIOGRAM (TEE);  Surgeon: Larey Dresser, MD;  Location: [place];   Service: Cardiovascular;  Laterality: N/A;  . TOTAL KNEE ARTHROPLASTY Left 2000s   Family History  Problem Relation Age of Onset  . Stroke Mother   . Emphysema Mother   . Lung cancer Mother   . Heart attack Father   . Drug abuse Father   . Heart attack Maternal Grandfather   . Heart attack Paternal Grandfather   . Diabetes Sister    Social History   Social History  . Marital status: Divorced    Spouse name: N/A  . Number of children: N/A  . Years of education: N/A   Social History Main Topics  . Smoking status: Never Smoker  . Smokeless tobacco: Never Used     Comment: Second-hand exposure through parents  . Alcohol use 1.2 oz/week    2 Glasses of wine per week  . Drug use: No  . Sexual activity: Yes   Other Topics Concern  . None   Social History Narrative   Originally he is from [place], [place]. He moved to [facility] in 2013. He has a dog & cat at home. No bird or mold exposure. Has a hot tub but hasn't used it in 4 months. 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       Objective:   Physical Exam BP 134/72 (BP Location: Left Arm, Cuff Size: Normal)   Pulse (!) 108   Ht 5\' 11"  (1.803 m)   Wt 238 lb (108 kg)   SpO2 93%   BMI 33.19 kg/m   General:  Awake. Alert. Caucasian male. Integument:  Warm & dry. No rash on exposed skin. No bruising on exposed skin. Extremities:  No cyanosis or clubbing.  HEENT:  Moist mucus membranes. No nasal turbinate swelling. No scleral icterus. Cardiovascular:  Regular rate. No edema. Unable to appreciate JVD.  Pulmonary:  Clear to auscultation bilaterally. Normal work of breathing on room air. Abdomen: Soft. Normal bowel sounds. Mildly protuberant. Musculoskeletal:  Normal bulk and tone. No joint deformity or effusion appreciated. Brace present on right knee.  IMAGING CXR PA/LAT 02/11/17 (personally reviewed by me):  Low lung volumes. No focal masses or opacity appreciated. No pleural effusion or thickening. Heart normal in size & mediastinum normal  in contour.  CT CHEST W/O 07/25/16 (personally reviewed by me):  1.2 cm right lower lobe nodule unchanged in size. No new nodule or opacity appreciated. No pleural effusion or thickening. No pericardial effusion. No pathologic mediastinal adenopathy.  CT CHEST W/O 11/15/15 (previously reviewed by me with the patient): No pathologic mediastinal adenopathy. 1.2 cm right hilar lymph node/nodule unchanged. No developing nodule or opacity. No pleural effusion or thickening. No pericardial effusion.  PET CT 08/07/15 (previously reviewed by me): 1.2 cm right perihilar nodule or lymph node without hypermetabolic activity above background. 8 mm mildly hypermetabolic lymph node in the AP window. No other suspicious areas of hypermetabolic activity noted.  CTA CHEST 06/16/15 (previously reviewed by me): No pulmonary embolus. No pathologic mediastinal adenopathy. Pharmacist, community  present in esophagus to the cervical spine. No pleural effusion or thickening. No pericardial effusion. 1.5x1.2cm rounded nodular density in the fissure adjacent the pulmonary artery & right lower lobe bronchus. No other nodules or opacities appreciated.  LABS 06/28/15 CBC: 6.9/13.8/41.0/194    Assessment & Plan:  66 y.o. male with incidentally found right lung nodule. Nodule did not change in size over the course of one year and was not hypermetabolic on PET/CT imaging. Due to patient's claustrophobia he is unable to comply with further CT imaging per guidelines despite premedication with 4 mg of Xanax. Given the low risk of malignancy the patient wishes to defer further chest imaging at this time which I feel is reasonable. He is aware of the risk of malignancy. I instructed him to contact my office if he develops any new breathing problems as I would be happy to see him back.  1. Right middle lobe nodule: Patient declining further imaging at this time. 2. Follow-up: Return to clinic as needed.

## 2017-08-25 ENCOUNTER — Ambulatory Visit: Payer: Medicare Other

## 2017-09-09 DIAGNOSIS — D2262 Melanocytic nevi of left upper limb, including shoulder: Secondary | ICD-10-CM | POA: Diagnosis not present

## 2017-09-09 DIAGNOSIS — D225 Melanocytic nevi of trunk: Secondary | ICD-10-CM | POA: Diagnosis not present

## 2017-09-09 DIAGNOSIS — L570 Actinic keratosis: Secondary | ICD-10-CM | POA: Diagnosis not present

## 2017-09-09 DIAGNOSIS — D2261 Melanocytic nevi of right upper limb, including shoulder: Secondary | ICD-10-CM | POA: Diagnosis not present

## 2017-09-09 DIAGNOSIS — L821 Other seborrheic keratosis: Secondary | ICD-10-CM | POA: Diagnosis not present

## 2017-09-30 DIAGNOSIS — E7849 Other hyperlipidemia: Secondary | ICD-10-CM | POA: Diagnosis not present

## 2017-09-30 DIAGNOSIS — Z125 Encounter for screening for malignant neoplasm of prostate: Secondary | ICD-10-CM | POA: Diagnosis not present

## 2017-09-30 DIAGNOSIS — R82998 Other abnormal findings in urine: Secondary | ICD-10-CM | POA: Diagnosis not present

## 2017-09-30 DIAGNOSIS — R7309 Other abnormal glucose: Secondary | ICD-10-CM | POA: Diagnosis not present

## 2017-09-30 DIAGNOSIS — I10 Essential (primary) hypertension: Secondary | ICD-10-CM | POA: Diagnosis not present

## 2017-10-06 DIAGNOSIS — E7849 Other hyperlipidemia: Secondary | ICD-10-CM | POA: Diagnosis not present

## 2017-10-06 DIAGNOSIS — M542 Cervicalgia: Secondary | ICD-10-CM | POA: Diagnosis not present

## 2017-10-06 DIAGNOSIS — I25118 Atherosclerotic heart disease of native coronary artery with other forms of angina pectoris: Secondary | ICD-10-CM | POA: Diagnosis not present

## 2017-10-06 DIAGNOSIS — N528 Other male erectile dysfunction: Secondary | ICD-10-CM | POA: Diagnosis not present

## 2017-10-06 DIAGNOSIS — L570 Actinic keratosis: Secondary | ICD-10-CM | POA: Diagnosis not present

## 2017-10-06 DIAGNOSIS — J328 Other chronic sinusitis: Secondary | ICD-10-CM | POA: Diagnosis not present

## 2017-10-06 DIAGNOSIS — I480 Paroxysmal atrial fibrillation: Secondary | ICD-10-CM | POA: Diagnosis not present

## 2017-10-06 DIAGNOSIS — R918 Other nonspecific abnormal finding of lung field: Secondary | ICD-10-CM | POA: Diagnosis not present

## 2017-10-06 DIAGNOSIS — Z1389 Encounter for screening for other disorder: Secondary | ICD-10-CM | POA: Diagnosis not present

## 2017-10-06 DIAGNOSIS — I10 Essential (primary) hypertension: Secondary | ICD-10-CM | POA: Diagnosis not present

## 2017-10-06 DIAGNOSIS — Z0000 Encounter for general adult medical examination without abnormal findings: Secondary | ICD-10-CM | POA: Diagnosis not present

## 2017-10-06 DIAGNOSIS — Z6833 Body mass index (BMI) 33.0-33.9, adult: Secondary | ICD-10-CM | POA: Diagnosis not present

## 2017-10-21 DIAGNOSIS — N5201 Erectile dysfunction due to arterial insufficiency: Secondary | ICD-10-CM | POA: Diagnosis not present

## 2017-10-30 ENCOUNTER — Other Ambulatory Visit: Payer: Self-pay | Admitting: Nurse Practitioner

## 2017-11-13 ENCOUNTER — Encounter: Payer: Self-pay | Admitting: Physician Assistant

## 2017-11-13 NOTE — Progress Notes (Addendum)
Cardiology Office Note    Date:  11/16/2017  ID:  Troy [name], DOB 06-06-1951, MRN [id number] PCP:  Velna Hatchet, MD  Cardiologist: Dr. Radford Pax EP: Dr. Alroy Bailiff   Chief Complaint: f/u afib, CAD  History of Present Illness:  Troy [name] is a 67 y.o. male with history of CAD (PCI [place] in 2010, unstable angina 02/980 complicated by VF arrest after contrast injection, s/p DES to mLAD and RPDA, stable cath with moderate disease 11/2016 with chronic chest pain), ?renal insufficiency, dyslipidemia, HTN, lung nodule (pulm followed, pt declined further imaging), OSA on CPAP, persistent atrial fib (possible flutter 10/2016) s/p multiple prior DCCVs who presents for 6 month follow-up.   Per chart, he was hospitalized in August 2016 with unstable angina and found to have LAD and PDA disease. He was also an atrial fibrillation at the time. During his catheterization and stenting, he developed ventricular fibrillation requiring defibrillation. This resulted in conversion to sinus rhythm. He had no further A. fib during his hospitalization and subsequently followed up in clinic, at which point he was doing well. He was wearing an event monitor and this subsequently showed paroxysmal atrial fibrillation, which was asymptomatic, prompting initiation of Eliquis. In 10/2016 he was seen back in clinic and reported to be in atrial flutter with RVR (EKG not available for review). He had self-discontinued Eliquis previous to this due to cost. He was initiated back on Eliquis as well as diltiazem CD 120mg  daily. Before DCCV could occur he developed chest pain with abnormal nuclear stress test prompting cath 11/13/16 showing 80% ostial diag, 60% mid RCA, no acute lesions, medical therapy recommended. He underwent TEE/DCCV 11/17/16. 2D echo 11/18/16 showed EF 60-65%, severe LVH, moderate LAE, severely dilated RA, mildly dilated RV. He developed recurrent atrial fib and was admitted 12/2016 for sotalol initiation  and DCCV, but went back into afib despite sotalol. He was subsequently trialed on amiodarone. He followed up with the afib clinic who felt Tikosyn was not an option due to cost/baseline QTC prolongation and Multaq would not likely be strong enough. He underwent repeat DCCV 01/2017. In follow-up 02/11/17 he was feeling SOB (still in NSR) so amiodarone was decreased to once a day. He went back into atrial fib 4/6 and underwent repeat DCCV 02/20/17. He ended up seeing Dr. Curt Bears to review possible ablation. The patient elected to defer, contingent on any worsening returning symptoms. The patient had stopped amiodarone completely at that time. CPAP downloads earlier this year per Dr. Theodosia Blender review did show need for improved compliance. At last visit 04/2017 Dr. Radford Pax had noted prior finding of severe hypertrophy of IVS on septum and recommended cardiac MRI but the patient was too anxious to proceed with this. She states "Severe hypertrophy of the IVS on echo with Septal wall thickness of 15.6mm.  His wall thickness was normal 2 years ago so doubt this is HOCM and likely related to HTN.  He does not have any other findings of HOCM on echo.  I will get a cardiac MRI to evaluate further.  He has no history of syncope but his father did have SCD (he did drugs)." Repeat echo 1 year was recommended.   Regarding pulm nodule, last OV with pulmonology 07/2017: "Nodule did not change in size over the course of one year and was not hypermetabolic on PET/CT imaging. Due to patient's claustrophobia he is unable to comply with further CT imaging per guidelines despite premedication with 4 mg of Xanax. Given the low risk of  malignancy the patient wishes to defer further chest imaging at this time which I feel is reasonable. He is aware of the risk of malignancy. I instructed him to contact my office if he develops any new breathing problems as I would be happy to see him back."  He presents back to clinic today feeling very well.  He's had several medicine changes since last OV here. He remains off amiodarone (as above). Spironolactone was stopped but not by [place] - he's not sure who changed this or why. He also stopped his Imdur on his own accord as he's not had any recent angina. He's been exercising regularly and feeling great doing so. He also states PCP cut his atorvastatin down due to his numbers being excellent recently. He thinks the dose was 10mg  daily or maybe 20mg  daily. (ADDENDUM 3:42 PM: Dr. Ardeth Perfect called back to clarify that when he saw patient, the patient was not taking any statin at all so he had restarted atorvastatin at 20mg  daily. He restarted this dose about 6 weeks ago.) Last LDL per POC report was 122 on 09/30/17 with repeat Cr 1.0 at that time. He has not had any recent chest pain, dyspnea, or palpitations. He feels his atrial fib completely disappeared after losing 15 lbs and starting a Mag supplement. No bleeding issues on Xarelto. Last labs otherwise in 07/2017 showed normal AST/ALT, LDL 119 (prompting recommendation for lipid clinic referral), 05/2017 showed K 4.6 and Cr 1.38 (previously 1.1-1.2), in 02/2017 had normal TSH.   Past Medical History:  Diagnosis Date  . Arthritis    "knees" (01/02/2017)  . CAD (coronary artery disease)    a. s/p PCI in 2010 in [place]. b. Unstable angina/LHC 04/11/8314 complicated by V fib arrest after contrast injection. Cath 06/19/2015 s/p DES to mid LAD and RPDA. c. 11/2016: chest pain/ abnormal nuclear stress test prompting cath 11/13/16 showing 80% ostial diag, 60% mid RCA, no acute lesions, medical therapy recommended.  . Dyslipidemia   . Essential hypertension   . Lung nodule < 6cm on CT 06/16/15   Right lung  . Mitral regurgitation    a. Mild-mod by echo 06/2015. b. not seen other than trivial in 2018.  . OSA on CPAP   . Persistent atrial fibrillation ([place])    a. 06/2015 noted to be in new a-fib when arrived with unstable angina. Converted to NSR after being shocked in the  cath lab for vfib;  b. 06/2015 Eliquis initiated as PAF noted on event monitor. c. Recurrent atrial fib?flutter 10/2016, issues with recurrent AF requiring multiple DCCVs in 02/2017. Failed sotalol, not candidate for Tikosyn due to cost/QTC, Multaq not felt likely strong enough.  . Pulmonary nodule 07/2015   a. 1.5 x 1.2 cm smoothly marginated nodule in the central aspect of the right lower lobe with recommendation correlation with nonemergent PET-CT to exclude a neoplasm , stable by CT 07/2016  . Severe left ventricular hypertrophy   . Ventricular fibrillation ([place])    a. occured during cath 06/18/2015.    Past Surgical History:  Procedure Laterality Date  . CARDIAC CATHETERIZATION N/A 06/18/2015   Procedure: Left Heart Cath and Coronary Angiography;  Surgeon: Burnell Blanks, MD;  Location: [facility];  Service: Cardiovascular;  Laterality: N/A;  . CARDIAC CATHETERIZATION N/A 06/19/2015   Procedure: Coronary Stent Intervention;  Surgeon: Peter M Martinique, MD;  Location: [facility];  Service: Cardiovascular;  Laterality: N/A;  . CARDIAC CATHETERIZATION N/A 11/13/2016   Procedure: Left  Heart Cath and Coronary Angiography;  Surgeon: Lorretta Harp, MD;  Location: [facility];  Service: Cardiovascular;  Laterality: N/A;  . CARDIOVERSION N/A 11/17/2016   Procedure: CARDIOVERSION;  Surgeon: Larey Dresser, MD;  Location: [facility];  Service: Cardiovascular;  Laterality: N/A;  . CARDIOVERSION N/A 01/03/2017   Procedure: CARDIOVERSION;  Surgeon: Lelon Perla, MD;  Location: [facility];  Service: Cardiovascular;  Laterality: N/A;  . CARDIOVERSION N/A 02/05/2017   Procedure: Cardioversion;  Surgeon: Evans Lance, MD;  Location: [facility];  Service: Cardiovascular;  Laterality: N/A;  . CARDIOVERSION N/A 02/20/2017   Procedure: Cardioversion;  Surgeon: Evans Lance, MD;  Location: [facility];  Service: Cardiovascular;  Laterality: N/A;  . CARTILAGE SURGERY  Bilateral    "thumbs"  . CATARACT EXTRACTION W/ INTRAOCULAR LENS  IMPLANT, BILATERAL Bilateral   . CORONARY ANGIOPLASTY WITH STENT PLACEMENT  ~ 2007  . JOINT REPLACEMENT    . KNEE ARTHROSCOPY Right   . RETINAL DETACHMENT SURGERY Bilateral    "laser thing"  . TEE WITHOUT CARDIOVERSION N/A 11/17/2016   Procedure: TRANSESOPHAGEAL ECHOCARDIOGRAM (TEE);  Surgeon: Larey Dresser, MD;  Location: [facility]COPY;  Service: Cardiovascular;  Laterality: N/A;  . TOTAL KNEE ARTHROPLASTY Left 2000s    Current Medications: Current Meds  Medication Sig  . atorvastatin (LIPITOR) 10 MG tablet Take 10 mg by mouth daily.  Marland Kitchen ibuprofen (ADVIL,MOTRIN) 800 MG tablet Take 1 tablet (800 mg total) by mouth 3 (three) times daily.  Marland Kitchen LORazepam (ATIVAN) 0.5 MG tablet Take 0.5 mg by mouth daily as needed for anxiety.  Marland Kitchen losartan (COZAAR) 100 MG tablet Take 1 tablet (100 mg total) by mouth daily.  . metoprolol (LOPRESSOR) 100 MG tablet Take one-half tablet (50 mg) by mouth twice daily  . niacin 100 MG tablet Take 100 mg by mouth 2 (two) times daily with a meal.  . nitroGLYCERIN (NITROSTAT) 0.3 MG SL tablet Place 1 tablet (0.3 mg total) under the tongue every 5 (five) minutes as needed for chest pain.  . Omega-3 Fatty Acids (FISH OIL) 1000 MG CPDR Take 2,000 mg by mouth 2 (two) times daily.   Marland Kitchen PRESCRIPTION MEDICATION Inhale into the lungs at bedtime. CPAP  . TURMERIC PO Take 2 tablets by mouth 2 (two) times daily.   Alveda Reasons 20 MG TABS tablet TAKE 1 TABLET(20 MG) BY MOUTH DAILY WITH SUPPER     Allergies:   Contrast media [iodinated diagnostic agents]; Gluten meal; Metrizamide; Tizanidine; Whey; and Morphine and related   Social History   Socioeconomic History  . Marital status: Divorced    Spouse name: None  . Number of children: None  . Years of education: None  . Highest education level: None  Social Needs  . Financial resource strain: None  . Food insecurity - worry: None  . Food insecurity - inability:  None  . Transportation needs - medical: None  . Transportation needs - non-medical: None  Occupational History  . None  Tobacco Use  . Smoking status: Never Smoker  . Smokeless tobacco: Never Used  . Tobacco comment: Second-hand exposure through parents  Substance and Sexual Activity  . Alcohol use: Yes    Alcohol/week: 1.2 oz    Types: 2 Glasses of wine per week  . Drug use: No  . Sexual activity: Yes  Other Topics Concern  . None  Social History Narrative   Originally he is from [place], [place]. He moved to [place] in  2013. He has a dog & cat at home. No bird or mold exposure. Has a hot tub but hasn't used it in 4 months. 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      Family History:  Family History  Problem Relation Age of Onset  . Stroke Mother   . Emphysema Mother   . Lung cancer Mother   . Heart attack Father   . Drug abuse Father   . Heart attack Maternal Grandfather   . Heart attack Paternal Grandfather   . Diabetes Sister     ROS:   Please see the history of present illness.  All other systems are reviewed and otherwise negative.    PHYSICAL EXAM:   VS:  BP 138/84   Pulse (!) 56   Ht 5\' 11"  (1.803 m)   Wt 230 lb 6.4 oz (104.5 kg)   SpO2 96%   BMI 32.13 kg/m   BMI: Body mass index is 32.13 kg/m. GEN: Well nourished, well developed WM, in no acute distress  HEENT: normocephalic, atraumatic Neck: no JVD, carotid bruits, or masses Cardiac: RRR; no murmurs, rubs, or gallops, no edema  Respiratory:  clear to auscultation bilaterally, normal work of breathing GI: soft, nontender, nondistended, + BS MS: no deformity or atrophy  Skin: warm and dry, no rash Neuro:  Alert and Oriented x 3, Strength and sensation are intact, follows commands Psych: euthymic mood, full affect  Wt Readings from Last 3 Encounters:  11/16/17 230 lb 6.4 oz (104.5 kg)  07/30/17 238 lb (108 kg)  04/29/17 249 lb 9.6 oz (113.2 kg)      Studies/Labs Reviewed:   EKG:  EKG was ordered today and  personally reviewed by me and demonstrates sinus bradycardia 59bpm, no acute ST-T changes. QTC 414ms.  Recent Labs: 01/04/2017: Magnesium 2.0 02/11/2017: Hemoglobin 15.2; NT-Pro BNP 532; Platelets 168; TSH 2.500 05/12/2017: BUN 24; Creatinine, Ser 1.38; Potassium 4.6; Sodium 140 07/27/2017: ALT 26   Lipid Panel    Component Value Date/Time   CHOL 188 07/27/2017 0945   TRIG 131 07/27/2017 0945   HDL 43 07/27/2017 0945   CHOLHDL 4.4 07/27/2017 0945   CHOLHDL 2.5 08/15/2015 0912   VLDL 15 08/15/2015 0912   LDLCALC 119 (H) 07/27/2017 0945    Additional studies/ records that were reviewed today include: Summarized above    ASSESSMENT & PLAN:   1. Persistent atrial fib also possible atrial flutter in 10/2016 - patient reports quiescence of this ever since he lost 15lb and started taking magnesium. He's not had any bleeding issues on Xarelto. Check surveillance labs today, including TSH, CBC, BMET, Mg. Bleeding precautions reviewed. He does report Xarelto is more costly due to the donut hole lately. He will investigate and get back with [place] if this proves to be cost prohibitive. Eliquis was previously stopped due to lack of coverage. 2. CAD - no recent anginal symptoms. He may be having knee surgery in the next few months. As it stands right now, he would be cleared from cardiovascular perspective as long as he remains clinically stable before this occurs. I told him the ortho office can contact us closer to date of surgery to review anticoagulation. He is not on ASA due to concomitant Xarelto per primary cardiologist. It's not clear why his atorvastatin was dropped down. He denies any issues with this. Per guidelines he should still be on 80mg  daily given his CAD. He is also on Zetia still as well. Will recheck CMET/lipid profile today and anticipate raising  this back up with continued future monitoring as well. Will ask nurse to call PCP office to clarify any underlying issues which prompted decrease  of statin therapy. Addendum: PCP's office indicates patient had stopped statin on his own and Dr. Ardeth Perfect had recommended to resume at 20mg  daily. Will f/u lipid panel today and anticipate further titration back to 80mg  daily if LFTs OK. 3. Left ventricular hypertrophy - patient previously declined cMRI, will update echo as recommended. 4. Essential HTN - pt reports BP elevation to the 585 systolic range at home. Goal BP would be closer to 130/80 or less given his history. F/u labwork. He was previously on spironolactone but this was stopped for unclear reasons. He did have mild renal insufficiency in the past; question whether this contributed. Can consider addition of amlodipine. 5. Sleep apnea - continue follow up with Dr. Radford Pax. Pt reports improvement since weight los. 6. Dyslipidemia - see above regarding statin f/u. Will discontinue niacin given lack of clinical data to support continued use.  Disposition: F/u with Dr. Radford Pax in 6 months.   Medication Adjustments/Labs and Tests Ordered: Current medicines are reviewed at length with the patient today.  Concerns regarding medicines are outlined above. Medication changes, Labs and Tests ordered today are summarized above and listed in the Patient Instructions accessible in Encounters.   Signed, Charlie Pitter, PA-C  11/16/2017 9:55 AM    [address], [place], [place]  [address] Phone: [phone]; Fax: [phone]

## 2017-11-16 ENCOUNTER — Ambulatory Visit (INDEPENDENT_AMBULATORY_CARE_PROVIDER_SITE_OTHER): Payer: Medicare Other | Admitting: Physician Assistant

## 2017-11-16 ENCOUNTER — Encounter: Payer: Self-pay | Admitting: Physician Assistant

## 2017-11-16 ENCOUNTER — Telehealth: Payer: Self-pay | Admitting: *Deleted

## 2017-11-16 VITALS — BP 138/84 | HR 56 | Ht 71.0 in | Wt 230.4 lb

## 2017-11-16 DIAGNOSIS — I517 Cardiomegaly: Secondary | ICD-10-CM | POA: Diagnosis not present

## 2017-11-16 DIAGNOSIS — Z9989 Dependence on other enabling machines and devices: Secondary | ICD-10-CM | POA: Diagnosis not present

## 2017-11-16 DIAGNOSIS — I481 Persistent atrial fibrillation: Secondary | ICD-10-CM

## 2017-11-16 DIAGNOSIS — I2510 Atherosclerotic heart disease of native coronary artery without angina pectoris: Secondary | ICD-10-CM

## 2017-11-16 DIAGNOSIS — G4733 Obstructive sleep apnea (adult) (pediatric): Secondary | ICD-10-CM | POA: Diagnosis not present

## 2017-11-16 DIAGNOSIS — E785 Hyperlipidemia, unspecified: Secondary | ICD-10-CM

## 2017-11-16 DIAGNOSIS — N289 Disorder of kidney and ureter, unspecified: Secondary | ICD-10-CM | POA: Diagnosis not present

## 2017-11-16 DIAGNOSIS — I4819 Other persistent atrial fibrillation: Secondary | ICD-10-CM

## 2017-11-16 LAB — CBC
Hematocrit: 44.4 % (ref 37.5–51.0)
Hemoglobin: 15.3 g/dL (ref 13.0–17.7)
MCH: 29.7 pg (ref 26.6–33.0)
MCHC: 34.5 g/dL (ref 31.5–35.7)
MCV: 86 fL (ref 79–97)
PLATELETS: 168 10*3/uL (ref 150–379)
RBC: 5.15 x10E6/uL (ref 4.14–5.80)
RDW: 14.1 % (ref 12.3–15.4)
WBC: 6.6 10*3/uL (ref 3.4–10.8)

## 2017-11-16 LAB — LIPID PANEL
CHOLESTEROL TOTAL: 201 mg/dL — AB (ref 100–199)
Chol/HDL Ratio: 4.1 ratio (ref 0.0–5.0)
HDL: 49 mg/dL (ref >39)
LDL Calculated: 116 mg/dL — ABNORMAL HIGH (ref 0–99)
TRIGLYCERIDES: 178 mg/dL — AB (ref 0–149)
VLDL Cholesterol Cal: 36 mg/dL (ref 5–40)

## 2017-11-16 LAB — COMPREHENSIVE METABOLIC PANEL
ALK PHOS: 45 IU/L (ref 39–117)
ALT: 19 IU/L (ref 0–44)
AST: 14 IU/L (ref 0–40)
Albumin/Globulin Ratio: 2.3 — ABNORMAL HIGH (ref 1.2–2.2)
Albumin: 4.6 g/dL (ref 3.6–4.8)
BUN/Creatinine Ratio: 24 (ref 10–24)
BUN: 22 mg/dL (ref 8–27)
Bilirubin Total: 0.6 mg/dL (ref 0.0–1.2)
CALCIUM: 9.8 mg/dL (ref 8.6–10.2)
CO2: 22 mmol/L (ref 20–29)
Chloride: 105 mmol/L (ref 96–106)
Creatinine, Ser: 0.93 mg/dL (ref 0.76–1.27)
GFR calc Af Amer: 97 mL/min/{1.73_m2} (ref >59)
GFR, EST NON AFRICAN AMERICAN: 84 mL/min/{1.73_m2} (ref >59)
GLOBULIN, TOTAL: 2 g/dL (ref 1.5–4.5)
Glucose: 102 mg/dL — ABNORMAL HIGH (ref 65–99)
POTASSIUM: 4.1 mmol/L (ref 3.5–5.2)
SODIUM: 142 mmol/L (ref 134–144)
Total Protein: 6.6 g/dL (ref 6.0–8.5)

## 2017-11-16 LAB — TSH: TSH: 1.3 u[IU]/mL (ref 0.450–4.500)

## 2017-11-16 LAB — MAGNESIUM: MAGNESIUM: 2.3 mg/dL (ref 1.6–2.3)

## 2017-11-16 NOTE — Patient Instructions (Addendum)
Medication Instructions:  1.  STOP the Niacin   Labwork: TODAY:  CMET, MAGNESIUM, CBC, TSH, & LIPIDS  Testing/Procedures: Your physician has requested that you have an echocardiogram. Echocardiography is a painless test that uses sound waves to create images of your heart. It provides your doctor with information about the size and shape of your heart and how well your heart's chambers and valves are working. This procedure takes approximately one hour. There are no restrictions for this procedure.   Follow-Up: Your physician wants you to follow-up in: 6 MONTHS WITH DR. Mallie Snooks will receive a reminder letter in the mail two months in advance. If you don't receive a letter, please call our office to schedule the follow-up appointment.    Any Other Special Instructions Will Be Listed Below (If Applicable).  Echocardiogram An echocardiogram, or echocardiography, uses sound waves (ultrasound) to produce an image of your heart. The echocardiogram is simple, painless, obtained within a short period of time, and offers valuable information to your health care provider. The images from an echocardiogram can provide information such as:  Evidence of coronary artery disease (CAD).  Heart size.  Heart muscle function.  Heart valve function.  Aneurysm detection.  Evidence of a past heart attack.  Fluid buildup around the heart.  Heart muscle thickening.  Assess heart valve function.  Tell a health care provider about:  Any allergies you have.  All medicines you are taking, including vitamins, herbs, eye drops, creams, and over-the-counter medicines.  Any problems you or family members have had with anesthetic medicines.  Any blood disorders you have.  Any surgeries you have had.  Any medical conditions you have.  Whether you are pregnant or may be pregnant. What happens before the procedure? No special preparation is needed. Eat and drink normally. What happens during  the procedure?  In order to produce an image of your heart, gel will be applied to your chest and a wand-like tool (transducer) will be moved over your chest. The gel will help transmit the sound waves from the transducer. The sound waves will harmlessly bounce off your heart to allow the heart images to be captured in real-time motion. These images will then be recorded.  You may need an IV to receive a medicine that improves the quality of the pictures. What happens after the procedure? You may return to your normal schedule including diet, activities, and medicines, unless your health care provider tells you otherwise. This information is not intended to replace advice given to you by your health care provider. Make sure you discuss any questions you have with your health care provider. Document Released: 10/24/2000 Document Revised: 06/14/2016 Document Reviewed: 07/04/2013 Elsevier Interactive Patient Education  2017 [employer].    If you need a refill on your cardiac medications before your next appointment, please call your pharmacy.

## 2017-11-16 NOTE — Telephone Encounter (Signed)
Troy [name] from Dr. Hoover Brunette office called She verifies that the pt is currently on Atorvastatin 20 mg daily ( I changed it to the correct dose) Pt was on 80 mg daily back in 2016 10/01/16 the Atorvastatin was decreased to 20 mg daily, which pts LDL at that time  was 164, but the note doesn't state why.  She will have to send the dr a message to clarify why the Atorvastatin was changed and call us back.  Pt last LIPID was 09/30/17, which is what the POC reports.

## 2017-11-16 NOTE — Progress Notes (Signed)
Please let patient know labs were generally positive (except for cholesterol). Please let him know the following things: - blood sugar a few points above normal - f/u PCP for this - his kidney function has returned to normal. While this is great, I did notice that ibuprofen is on his medicine list. Given that he's on a blood thinner he should only be taking it sparingly as it can increase risk of stomach bleeding while on Xarelto. Please remove from med list - OK to take Tylenol or talk to PCP about alternatives for pain control - it sounds like when he saw PCP he actually was no longer taking the atorvastatin so PCP recommended to resume at the dose he's currently on. LDL is way too high. Please increase to atorvastatin 80mg  qpm with follow-up lipid profile and LFTs in 6 weeks. Please clarify whether or not he's actually still taking Zetia also now that he is at home with his pill bottles. - for blood pressure, please ask him to start amlodipine 5mg  daily - have him follow BP daily at home and call if any low readings (105 or less) or still running >435 systolic - arrange repeat BP check in HTN clinic or APP visit in 2-3 weeks (after echo) - bring all bottles to this appointment  . Cinzia Devos PA-C

## 2017-11-16 NOTE — Telephone Encounter (Signed)
Dr. Ardeth Perfect called back re: pts' Atorvastatin. He states when he last saw the pt, he was not taking any Atorvastatin at all, so that's why he restarted it back on 20 mg since his LDL was at 122.  He also statest that pt was 50% compliant with the Niacin, so you stopping it, you probably want see much a difference.  I did tell him that you checked Lipid today and he said pt had restarted approximately 6 weeks ago, so it should be a good time to see where the pt is.

## 2017-11-16 NOTE — Telephone Encounter (Signed)
Reached out to [facility], Dr. Hoover Brunette office, re: pts LDL and his Atorvastatin dose change. I have left a detailed message on [place], Therapist, sports, voicemail.

## 2017-11-18 ENCOUNTER — Telehealth: Payer: Self-pay | Admitting: *Deleted

## 2017-11-18 ENCOUNTER — Telehealth: Payer: Self-pay | Admitting: Physician Assistant

## 2017-11-18 DIAGNOSIS — Z79899 Other long term (current) drug therapy: Secondary | ICD-10-CM

## 2017-11-18 MED ORDER — AMLODIPINE BESYLATE 5 MG PO TABS: 5.0000 mg | ORAL_TABLET | Freq: Every day | ORAL | 3 refills | Status: DC

## 2017-11-18 MED ORDER — ATORVASTATIN CALCIUM 80 MG PO TABS: 80.0000 mg | ORAL_TABLET | Freq: Every day | ORAL | 3 refills | Status: DC

## 2017-11-18 NOTE — Telephone Encounter (Signed)
Returned pts call and he was asking for some assistance with the Xarelto. I transferred him to [facility], which handles that process.  Pt thanked me for the call.

## 2017-11-18 NOTE — Telephone Encounter (Signed)
-----   Message from Charlie Pitter, [place] sent at 11/16/2017  4:12 PM EST ----- Please let patient know labs were generally positive (except for cholesterol). Please let him know the following things: - blood sugar a few points above normal - f/u PCP for this - his kidney function has returned to normal. While this is great, I did notice that ibuprofen is on his medicine list. Given that he's on a blood thinner he should only be taking it sparingly as it can increase risk of stomach bleeding while on Xarelto. Please remove from med list - OK to take Tylenol or talk to PCP about alternatives for pain control - it sounds like when he saw PCP he actually was no longer taking the atorvastatin so PCP recommended to resume at the dose he's currently on. LDL is way too high. Please increase to atorvastatin 80mg  qpm with follow-up lipid profile and LFTs in 6 weeks. Please clarify whether or not he's actually still taking Zetia also now that he is at home with his pill bottles. - for blood pressure, please ask him to start amlodipine 5mg  daily - have him follow BP daily at home and call if any low readings (105 or less) or still running >295 systolic - arrange repeat BP check in HTN clinic or APP visit in 2-3 weeks (after echo) - bring all bottles to this appointment  . Dayna Dunn PA-C

## 2017-11-18 NOTE — Telephone Encounter (Signed)
New message  Pt verbalized that he is calling for RN  He said that Dayna gave him some information to help with his expenses   He want to speak more about his options, I informed him on the application that we have for hardship

## 2017-11-20 ENCOUNTER — Telehealth: Payer: Self-pay | Admitting: Physician Assistant

## 2017-11-20 NOTE — Telephone Encounter (Signed)
Patient calling for assistance with Xarelto medication.

## 2017-11-24 ENCOUNTER — Telehealth: Payer: Self-pay | Admitting: *Deleted

## 2017-11-24 ENCOUNTER — Ambulatory Visit (HOSPITAL_COMMUNITY): Payer: Medicare Other | Attending: Cardiology

## 2017-11-24 ENCOUNTER — Other Ambulatory Visit: Payer: Self-pay

## 2017-11-24 DIAGNOSIS — I2510 Atherosclerotic heart disease of native coronary artery without angina pectoris: Secondary | ICD-10-CM

## 2017-11-24 DIAGNOSIS — G4733 Obstructive sleep apnea (adult) (pediatric): Secondary | ICD-10-CM

## 2017-11-24 DIAGNOSIS — N289 Disorder of kidney and ureter, unspecified: Secondary | ICD-10-CM | POA: Diagnosis not present

## 2017-11-24 DIAGNOSIS — R931 Abnormal findings on diagnostic imaging of heart and coronary circulation: Secondary | ICD-10-CM

## 2017-11-24 DIAGNOSIS — E785 Hyperlipidemia, unspecified: Secondary | ICD-10-CM | POA: Diagnosis not present

## 2017-11-24 DIAGNOSIS — I481 Persistent atrial fibrillation: Secondary | ICD-10-CM | POA: Diagnosis not present

## 2017-11-24 DIAGNOSIS — Z9989 Dependence on other enabling machines and devices: Secondary | ICD-10-CM

## 2017-11-24 DIAGNOSIS — I080 Rheumatic disorders of both mitral and aortic valves: Secondary | ICD-10-CM | POA: Insufficient documentation

## 2017-11-24 DIAGNOSIS — I517 Cardiomegaly: Secondary | ICD-10-CM

## 2017-11-24 DIAGNOSIS — I4819 Other persistent atrial fibrillation: Secondary | ICD-10-CM

## 2017-11-24 NOTE — Telephone Encounter (Signed)
-----   Message from Charlie Pitter, [place] sent at 11/24/2017  4:00 PM EST ----- Please let patient know echo showed normal squeeze function, some thickening of heart muscle as well as stiffening which is probably due to his high blood pressure over time. Thickness by this ultrasound does not look as severe as last one, but the diastolic dysfunction (stiffening) is mentioned for the first time here. This is why getting his BP well controlled is important. Would recommend he follow up either with our office soon for a HTN clinic visit for goal BP <130/80 or with his primary care. Would advise to generally stick to lower sodium diet (aiming for maximum 2,000mg  per day) and staying hydrated but not to excess >2L per day of total fluid intake.   His ascending aorta appears mildly dilated (precursor to aneurysm) and the echo report suggests he should a CT angio of his aorta to futher evaluate this. Recent creatinine was normal.   Mainstays of therapy for aneurysms include good blood pressure control, healthy lifestyle, and avoiding fluoroquinolone antibiotic medications (such as those in the "Cipro" class, ending in "floxacin") due to risk of damage to the aorta. Since aneurysms can run in families, he should also discuss having first degree relatives screened for this. Regular mild-moderate physical exercise is OK but avoid heavy lifting/weight lifting over 30lbs, chopping wood, shoveling snow or digging heavy earth with a shovel.  Since aneurysms can run in families, he should also discuss having first degree relatives screened for this. If he would like an office visit to review the above, please arrange. I typically refer patients to this flyer ([email or web address]) for more information. Once you talk to patient, you can place this in MyChart comments if you are able to.   Dayna Dunn PA-C

## 2017-11-25 ENCOUNTER — Telehealth: Payer: Self-pay | Admitting: *Deleted

## 2017-11-25 ENCOUNTER — Telehealth: Payer: Self-pay | Admitting: Physician Assistant

## 2017-11-25 MED ORDER — PREDNISONE 50 MG PO TABS: ORAL_TABLET | ORAL | 0 refills | Status: DC

## 2017-11-25 NOTE — Telephone Encounter (Signed)
Returned pts call and let him know that we have 3 weeks worth of samples for him to be picked up along with patient assistance forms for him to get completed and get back to the office for [place] to see if he can qualify for Patient Assistance.  Pt thanked me for the call.

## 2017-11-25 NOTE — Telephone Encounter (Signed)
Called pt re: CT that has been ordered. Per CT, they don't like to do CT's when pt has a severe allergy to contrast, so pt will have to be done at the hospital.  I did call the pt to make him aware of this, and also went over the Prophylactic Medication that he will need to complete prior to his CT. Pt will also come by the office to pick these instructions up.  The Prednisone has been sent to [facility], per pt request.  Pt thanked me for all the help I have provided him.

## 2017-11-25 NOTE — Telephone Encounter (Signed)
New message    Patient calling the office for samples of medication:   1.  What medication and dosage are you requesting samples for?XARELTO 20 MG TABS tablet  2.  Are you currently out of this medication? yes

## 2017-11-26 DIAGNOSIS — M1711 Unilateral primary osteoarthritis, right knee: Secondary | ICD-10-CM | POA: Diagnosis not present

## 2017-11-30 ENCOUNTER — Encounter: Payer: Self-pay | Admitting: Physician Assistant

## 2017-12-01 ENCOUNTER — Telehealth: Payer: Self-pay

## 2017-12-01 NOTE — Telephone Encounter (Signed)
   [facility] Pre-operative Risk Assessment    Request for surgical clearance:  1. What type of surgery is being performed? Right TKA-medial and lateral w/wo patella resurfacing    2. When is this surgery scheduled? 02/08/18  3. What type of clearance is required (medical clearance vs. Pharmacy clearance to hold med vs. Both)?  Medical clearance  4. Are there any medications that need to be held prior to surgery and how long? Please instruct on Xarelto prior to surgery   5. Practice name and name of physician performing surgery?  [facility] 2. Dr. Pilar Plate Aluisio  6. What is your office phone and fax number? 1. Phone: [phone] 2. Fax: [place]    7. Anesthesia type (None, local, MAC, general) ? None specified    _________________________________________________________________   (provider comments below)

## 2017-12-01 NOTE — Telephone Encounter (Signed)
Pt tells me that his home BP are averaging 140s/80s.  After taking his first dose of Norvasc, last night, his BP "went up", "flushed face" and "ringing in ears" began.  Pt not sure why/when Spironolactone was stopped/discontinued. Discussed with Dr. Radford Pax -- will check w/ PCP and pharmacy about it being discontinued.  If no answer/reason found for stopping, then stop Amlodipine and restart Spironolactone (kidney function ok per last labs on file) 25 mg daily w/ BMET f/u 1-2 weeks after restarting.

## 2017-12-01 NOTE — Telephone Encounter (Signed)
   Request for cardiac clearance for right TKA received.  Patient already has an appointment scheduled with Sharrell Ku, PA-C on 12/04/2017.  Surgical clearance can be addressed at that time.  Rosaria Ferries, PA-C 12/01/2017 2:49 PM Beeper [phone]

## 2017-12-01 NOTE — Telephone Encounter (Signed)
Spoke w/ pt's PCP office who did not stop medication and still has it listed on his medication list.  Will restart medication.  Pt advised to stop Norvasc and restart Spironolactone tomorrow. Will arrange for BMET follow up post re initiation. Pt agreeable to calling the office if he finds out reason for stopping it in the first place. He also questions need for OV this Friday w/ Dayna Dunn since he just started BP med and CT has not been scheduled/completed. Pt understands I will reach out to her and call him tomorrow w/ her recommendation/s. He is also overdue for f/u w/ Camnitz and understands we will arrange OV when I call him tomorrow.

## 2017-12-02 MED ORDER — SPIRONOLACTONE 25 MG PO TABS: 25.0000 mg | ORAL_TABLET | Freq: Every day | ORAL | 3 refills | Status: DC

## 2017-12-02 NOTE — Telephone Encounter (Signed)
Spoke with patient again.  Informed that Troy [name] recommends that he follow up in one week post antihypertensive start.  Pt is going to be out of town.  Advised pt to monitor BPs and call me if they remain elevated.  He understands I will follow up with him in one/two weeks to see how his pressures are doing.  Also scheduled him to see Camnitz on 2/12 for overdue follow up. Patient verbalized understanding and agreeable to plan.

## 2017-12-02 NOTE — Telephone Encounter (Signed)
Patient noted to have cancelled visit with [name], PA for 12/04/2016 due to being out of town and says he will reschedule.   Surgery is not until April.   He will need a visit or a phone call prior to this date and his anticoagulation addressed.

## 2017-12-02 NOTE — Telephone Encounter (Signed)
**Note De-Identified Troy [name] Obfuscation** I just saw this message and called the pt to ask if someone from this office has called him back to discuss this matter.  The pt states that someone from this office did try to help him with this a couple weeks ago but was unable to as he feels he makes to much money to be eligible for pt assistance. He states that he is going to try to pay for his Xarelto out of pocket.

## 2017-12-03 NOTE — Telephone Encounter (Signed)
Routed to scheduling to reschedule follow up appt.

## 2017-12-04 ENCOUNTER — Ambulatory Visit: Payer: Medicare Other | Admitting: Physician Assistant

## 2017-12-04 NOTE — Telephone Encounter (Signed)
Pt scheduled on 2/12 for OV w/ Camnitz and to address clearance

## 2017-12-07 ENCOUNTER — Telehealth: Payer: Self-pay | Admitting: Physician Assistant

## 2017-12-07 NOTE — Telephone Encounter (Signed)
New message    Chrystie Nose from [place] [phone] calling; patient had allergic reaction to IV contrast. Please call

## 2017-12-07 NOTE — Telephone Encounter (Signed)
Spoke with Chrystie Nose, and she has been informed that we were aware that pt has an allergy to the contrast.  Pt has already been instructed on the prophylactic medication instructions and has been given a written copy.  Chrystie Nose will go ahead and get this scheduled.

## 2017-12-08 ENCOUNTER — Telehealth: Payer: Self-pay | Admitting: Cardiology

## 2017-12-08 DIAGNOSIS — I719 Aortic aneurysm of unspecified site, without rupture: Secondary | ICD-10-CM

## 2017-12-08 NOTE — Telephone Encounter (Signed)
Marlita with [facility] called to ask about this pt's order and the pt stating he is allergic to the dye.    Order:   CT Angio chest Aorta W/WO  She would like a call back please and a new order if that is what the DR. Wants.

## 2017-12-08 NOTE — Telephone Encounter (Signed)
Will forward to Dr. Radford Pax for input on whether she thinks this is something that could possibly be followed by echo instead (recent echo showed dilated ascending aorta of 4.4cm, and echo report suggested CTA/MRA to assess). Hold on scheduling for now. Dayna Dunn PA-C

## 2017-12-08 NOTE — Telephone Encounter (Signed)
Marleta from scheduling called.  Pt had VFIB arrest with IV Dye. See allergies. Pt has already been given medication and instructions with the Prednisone Marleta wants to know if we still want to pursue with the CT?  Change to w/o contrast?  Please advise!

## 2017-12-08 NOTE — Telephone Encounter (Signed)
Change to MRI/MRA

## 2017-12-09 ENCOUNTER — Other Ambulatory Visit: Payer: Self-pay | Admitting: Physician Assistant

## 2017-12-09 ENCOUNTER — Telehealth: Payer: Self-pay | Admitting: Cardiology

## 2017-12-09 MED ORDER — LORAZEPAM 0.5 MG PO TABS: ORAL_TABLET | ORAL | 0 refills | Status: DC

## 2017-12-09 NOTE — Telephone Encounter (Signed)
Sure. It appears the patient already has lorazepam 0.5mg  daily as needed for anxiety on his med list so he has tolerated this medicine in the past.   He can take 1-2 tabs (0.5-1mg ) prior to MRI/MRA, OK to send in #2 tab, zero refills. He will then need someone to drive him home from the procedure because of the sedative.  Shiniqua Groseclose PA-C

## 2017-12-09 NOTE — Telephone Encounter (Addendum)
Due to prescribing laws, nurse was unable to e-scribe. I tried to print out rx here in cath lab in hospital but it would not work, so I handwrote prescription and faxed to [facility] for pt to take 1 tablet by mouth 45 minutes before procedure; may take 1 additional tablet right before procedure if needed for claustrophobia (disp #2 with zero refills). Fax transmission confirmed.  Randolf Sansoucie PA-C

## 2017-12-09 NOTE — Telephone Encounter (Signed)
Looking through chart it appears it's still not totally clear if his VF was related to contrast versus brisk injection procedure during his original cath for unstable angina. He was pre-medicated in January 2018 for cath and underwent procedure without adverse reaction. However, to be on the safe side, let's change test to MRI/MRA of the chest to further evaluate (this uses gadolinium, a different kind of contrast not related to the kind used in CT so does not need pre-med). Dayna Dunn PA-C

## 2017-12-09 NOTE — Telephone Encounter (Signed)
Left Marleta a message at scheduling for pt.  Left her a message to call me back.  Will reach out to pt to let him know that he will have a MRI instead of the CT and that we have sent him in 2 Ativan's for him to take before his procedure.  Pt has been made aware that [name] will contact him to have his MRI scheduled and he has been instructed on the Ativan.  Pt thanked me for ally my help and verbalized understanding.

## 2017-12-09 NOTE — Telephone Encounter (Signed)
Spoke with [facility] @ [facility] scheduling. She says that the pt is really claustrophobic and will need a sedative to do a MRI/MRA. Are you willing to call pt something in for that?

## 2017-12-09 NOTE — Telephone Encounter (Signed)
New Message   Patient is returning call about upcoming MRI he is to have. Please call to discuss.

## 2017-12-09 NOTE — Addendum Note (Signed)
Addended by: Gaetano Net on: 12/09/2017 03:21 PM   Modules accepted: Orders

## 2017-12-09 NOTE — Telephone Encounter (Signed)
Left pt a message to call me back re: Ativan.

## 2017-12-10 NOTE — Telephone Encounter (Signed)
Returned call to pt and he stated that I had already answered all of his questions that he had and he thanked me for the cal.

## 2017-12-11 ENCOUNTER — Telehealth: Payer: Self-pay

## 2017-12-11 DIAGNOSIS — H52203 Unspecified astigmatism, bilateral: Secondary | ICD-10-CM | POA: Diagnosis not present

## 2017-12-11 DIAGNOSIS — H16401 Unspecified corneal neovascularization, right eye: Secondary | ICD-10-CM | POA: Diagnosis not present

## 2017-12-11 DIAGNOSIS — H1789 Other corneal scars and opacities: Secondary | ICD-10-CM | POA: Diagnosis not present

## 2017-12-11 DIAGNOSIS — H35372 Puckering of macula, left eye: Secondary | ICD-10-CM | POA: Diagnosis not present

## 2017-12-11 NOTE — Telephone Encounter (Signed)
   [facility] Pre-operative Risk Assessment    Request for surgical clearance:  1. What type of surgery is being performed? Extraction/bone graft  2. When is this surgery scheduled? Not specified  3. What type of clearance is required (medical clearance vs. Pharmacy clearance to hold med vs. Both)? Pharmacy (Pt on Xarelto)  4. Are there any medications that need to be held prior to surgery and how long? Xarelto. Does not specify length of time  5. Practice name and name of physician performing surgery? [facility] (Cannot read physician's name)   6. What is your office phone and fax number? Phone: [phone]  Fax:[phone]  7. Anesthesia type (None, local, MAC, general) ? IV - General    Jordan Likes 12/11/2017, 3:37 PM  _________________________________________________________________   (provider comments below)

## 2017-12-13 IMAGING — US SOFT TISSUE ULTRASOUND HEAD/NECK
1 series · 11 of 11 positions shown · non-contrast
Comparison: None.

CLINICAL DATA: Palpable abnormality within the superomedial aspect
of the right neck.

EXAM:
ULTRASOUND OF HEAD/NECK SOFT TISSUES
TECHNIQUE: Ultrasound examination of the head and neck soft tissues was
performed in the area of clinical concern.

[Series 1: soft tissue ultrasound head/neck · 0.08mm/px · 11 of 11 slices shown]
[im 1/11]
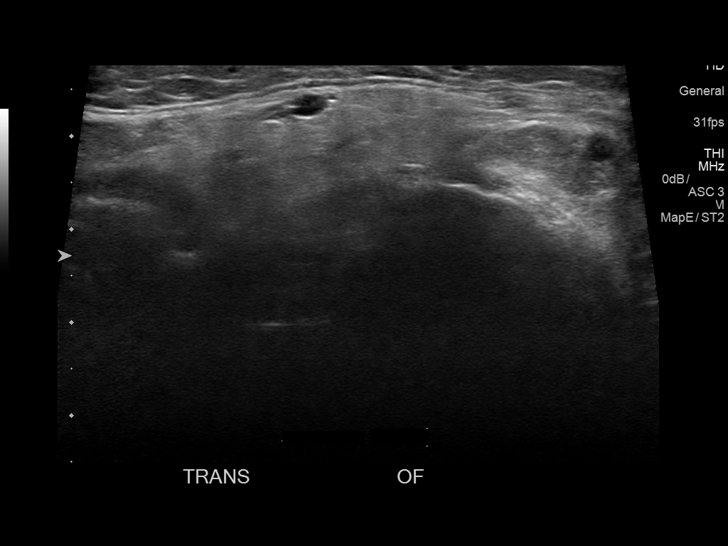
[im 2/11]
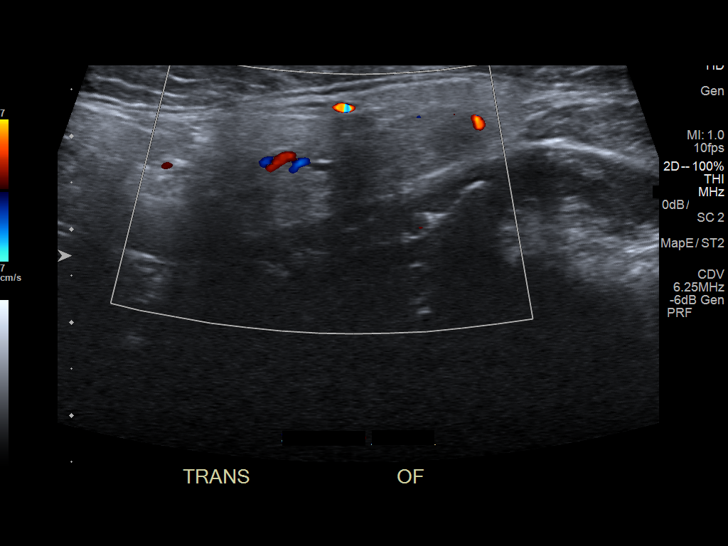
[im 3/11]
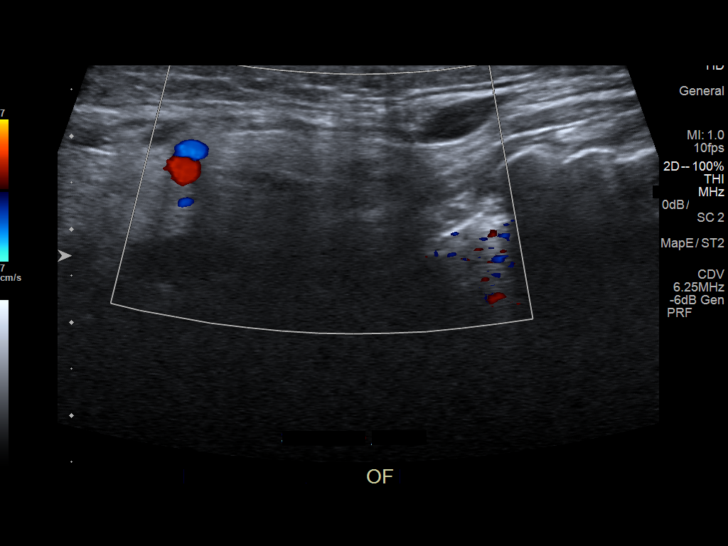
[im 4/11]
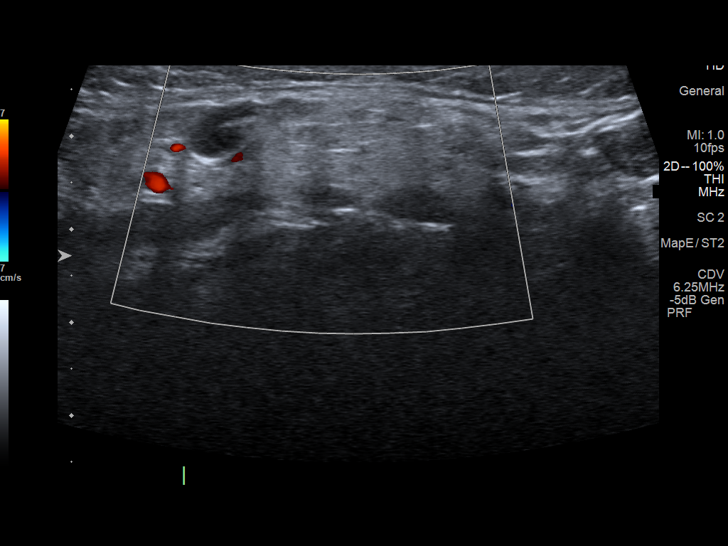
[im 5/11]
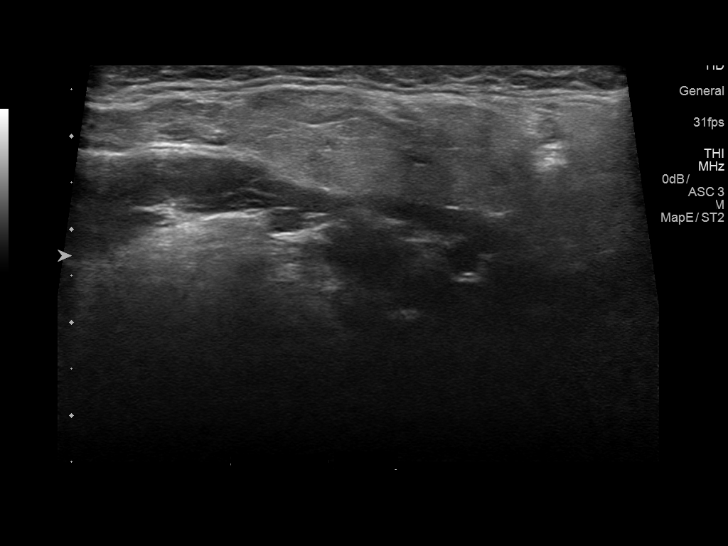
[im 6/11]
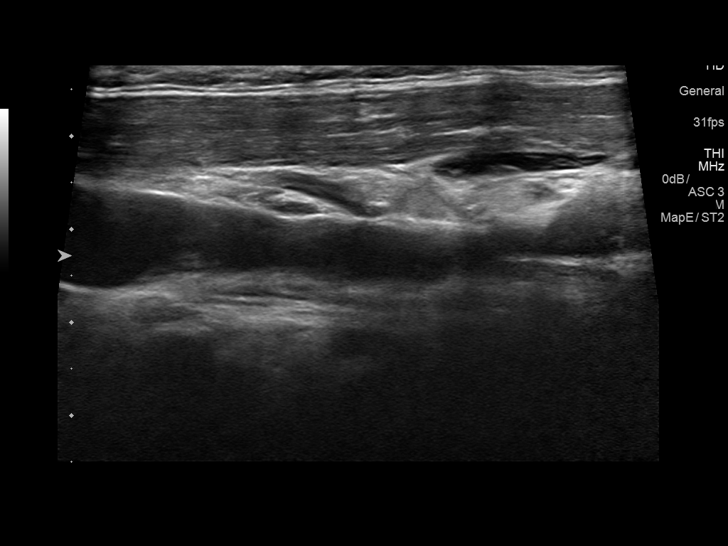
[im 7/11]
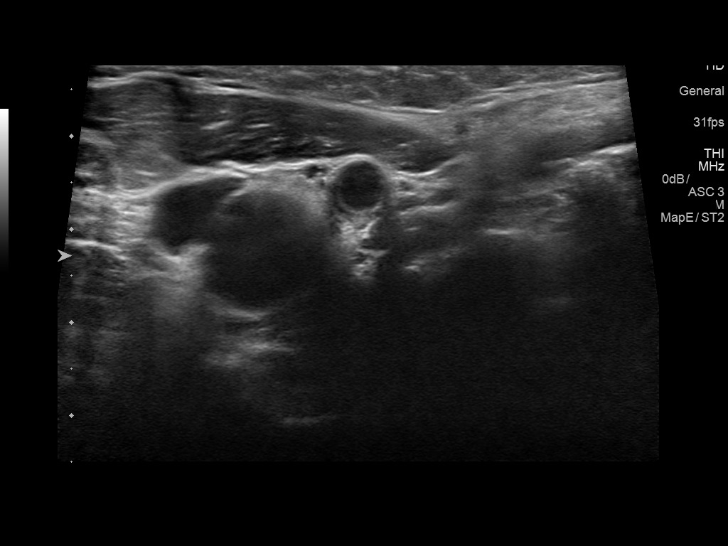
[im 8/11]
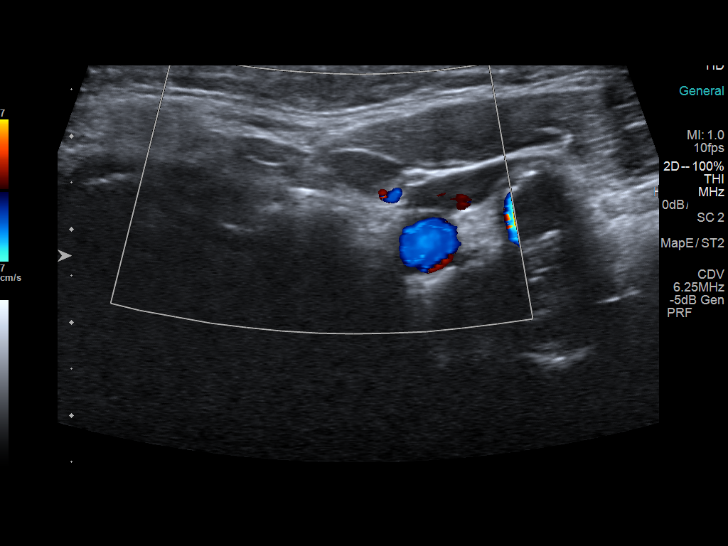
[im 9/11]
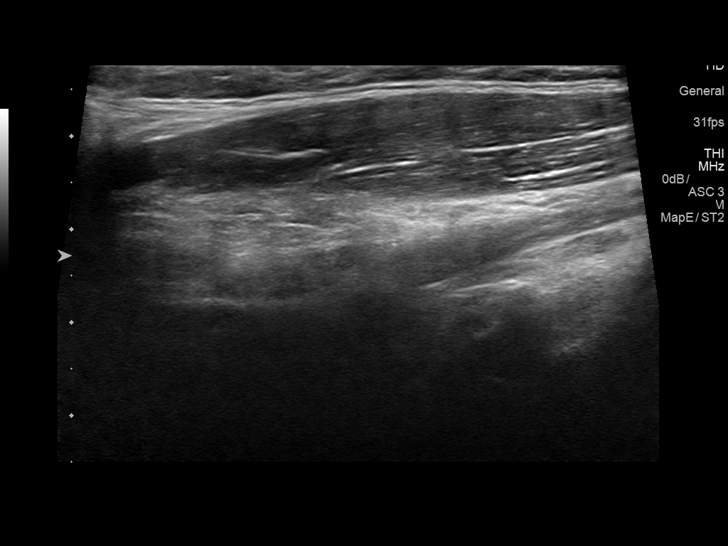
[im 10/11]
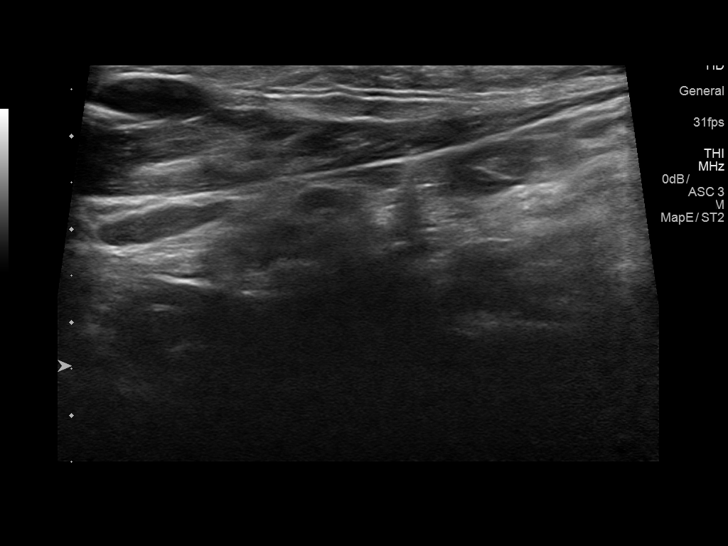
[im 11/11]
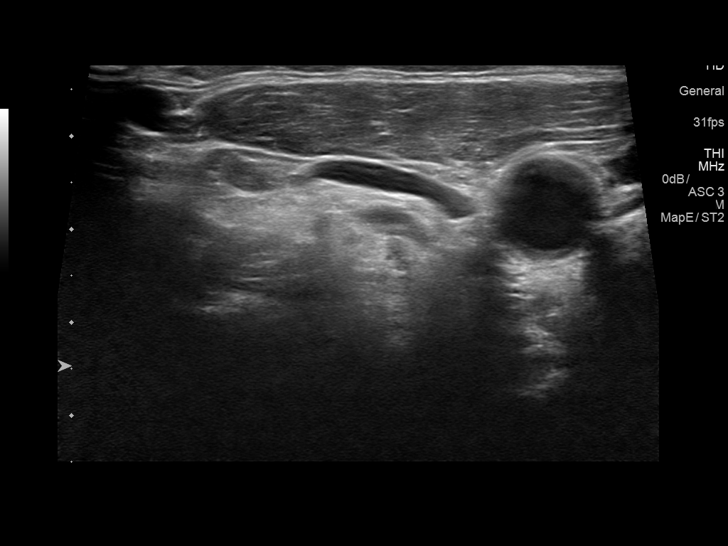

[11 of 11 positions shown; findings below may reference images not displayed]

FINDINGS: Sonographic evaluation of the patient's palpable area of concern is
negative for definitive sonographic correlate. Specifically, no
regional cervical lymphadenopathy. No discrete solid or cystic
lesions.
IMPRESSION: No sonographic correlate for patient's palpable area of concern
involving the superomedial aspect of the right-side of the neck.
Clinical correlation to resolution is advised. Otherwise,
contrast-enhanced neck CT could be performed as indicated.

## 2017-12-14 ENCOUNTER — Encounter: Payer: Self-pay | Admitting: *Deleted

## 2017-12-14 ENCOUNTER — Telehealth: Payer: Self-pay | Admitting: Physician Assistant

## 2017-12-14 DIAGNOSIS — Z79899 Other long term (current) drug therapy: Secondary | ICD-10-CM

## 2017-12-14 DIAGNOSIS — I10 Essential (primary) hypertension: Secondary | ICD-10-CM

## 2017-12-14 NOTE — Telephone Encounter (Signed)
Hi Sherri,  This is the complex patient we were talking about. After receiving his pre-op clearance in the box, there are several loose ends that need cleaning up.  1) Per 1/21 email, on 1/23 patient was advised to restart spironolactone. Per our staff message, please make sure he has a BMET within the next day or so to follow up potassium and kidney function. Needs to be done before MRA.  2) MRA to evaluate aortic aneurysm is scheduled for 2/8. Reviewed with MD in clinic. Given that the study is with gadolinium, not iodinated contrast, he does NOT need the prednisone pre-medication regimen beforehand.  3) He wished to cancel the follow-up he had with me in January due to being out of town. We were supposed to discuss further information about his dilated aorta at that time, including instructions listed on the 11/24/17 echo. I do not see this was conveyed to him as he was supposed to f/u so we could discuss in person. Please relay the following and offer to send via Mychart; "Mainstays of therapy for aneurysms include good blood pressure control, healthy lifestyle, and avoiding fluoroquinolone antibiotic medications (such as those in the "Cipro" class, ending in "floxacin") due to risk of damage to the aorta. Since aneurysms can run in families, he should also discuss having first degree relatives screened for this. Regular mild-moderate physical exercise is OK but avoid heavy lifting/weight lifting over 30lbs, chopping wood, shoveling snow or digging heavy earth with a shovel. Since aneurysms can run in families, he should also discuss having first degree relatives screened for this. If he would like an office visit to review the above, please arrange. I typically refer patients to this flyer ([email or web address]) for more information."  4) We will await his MRA and appointment with Dr. Curt Bears before formally clearing him for the 2  separate clearances that we received. Please make Dr. Curt Bears aware his note including instructions on Xarelto will need to be routed to the 2 parties requesting surgical clearance: a) Phone note 12/11/17 to [place] and b) [facility] Fax: [place]. I will be routing today's clearance (the maxillofacial one) to pharmacy so that we can have the most up to date recs on the Xarelto.  Will also cc Dr. Curt Bears on this, but his involvement really only thankfully includes #4.  Dayna Dunn PA-C

## 2017-12-14 NOTE — Telephone Encounter (Signed)
   Primary Cardiologist:Traci Turner, MD  Chart reviewed as part of pre-operative protocol coverage. See separate phone note from today. Pt recently seen in clinic by me. To recap, history of CAD (PCI [place] in 2010, unstable angina 03/276 complicated by VF arrest after contrast injection, s/p DES to mLAD and RPDA, stable cath with moderate disease 11/2016 with chronic chest pain), ?renal insufficiency, dyslipidemia, HTN, lung nodule (pulm followed, pt declined further imaging), OSA on CPAP, persistent atrial fib (possible flutter 10/2016) s/p multiple prior DCCVs. At OV 11/16/17 he was actually doing great from CV standpoint but there was evidence of some noncompliance with medications after conferring with PCP regarding meds that had fallen off his list for unclear reasons. I restarted atorvastatin. I added amlodipine for BP but patient did not tolerate so he was asked instead to resume spironolactone which he was previously on but somehow self-discontinued. I have requested f/u BMET. We had repeated echo to assess his LVH and this incidentally showed thoracic aortic dilation. CT was planned but due to contrast dye allergy was changed to MRA. This is scheduled for 2/8. He also has a second pre-op clearance listed in the chart from 1/22 for knee surgery. He has f/u with Dr. Curt Bears next week.  Due to the need for him to f/u for blood pressure, await BMET and MRA result, and upcoming appointment with Dr. Curt Bears, it makes most sense for his clearance to be addressed at this upcoming appointment on 2/12. I have made Dr. Curt Bears and his nurse Sherri aware. Sherri will be calling him. I will route this message to [place] at requesting fax number below to make them aware patient is pending appointment for surgical clearance 12/22/17.  In the interim while awaiting this appointment, will forward this chart to pharmacy to provide recs on holding Xarelto in prep for surgery so  this is available to Dr. Curt Bears at time of appointment next week.  Charlie Pitter, PA-C 12/14/2017, 4:04 PM

## 2017-12-14 NOTE — Telephone Encounter (Signed)
1) BMET scheduled for 2/6 2) advised he does not need prednisone pre-med regimen and gave rational.  Pt appreciative of information. 3) will forward info via [place].  Pt appreciative of being able to send it that way and looks forward to reading the information. 4) pt aware we address clearance/s next week at [place]  Patient verbalized understanding and agreeable to above stated plans.

## 2017-12-15 NOTE — Telephone Encounter (Signed)
Pt takes Xarelto for afib with CHADS2VASc score of 3 (age, CAD, HTN). Pt underwent DCCV on 11/17/16. Renal function is normal. Ok to hold Xarelto for 1 day prior to procedure.  For other upcoming TKA procedure on 02/08/18, recommend holding Xarelto for 3 days prior to procedure since spinal sedation is typically used.

## 2017-12-16 ENCOUNTER — Other Ambulatory Visit: Payer: Medicare Other | Admitting: *Deleted

## 2017-12-16 DIAGNOSIS — I10 Essential (primary) hypertension: Secondary | ICD-10-CM

## 2017-12-16 DIAGNOSIS — Z79899 Other long term (current) drug therapy: Secondary | ICD-10-CM | POA: Diagnosis not present

## 2017-12-16 LAB — BASIC METABOLIC PANEL
BUN / CREAT RATIO: 20 (ref 10–24)
BUN: 21 mg/dL (ref 8–27)
CO2: 24 mmol/L (ref 20–29)
Calcium: 9.7 mg/dL (ref 8.6–10.2)
Chloride: 102 mmol/L (ref 96–106)
Creatinine, Ser: 1.03 mg/dL (ref 0.76–1.27)
GFR calc Af Amer: 86 mL/min/{1.73_m2} (ref >59)
GFR, EST NON AFRICAN AMERICAN: 74 mL/min/{1.73_m2} (ref >59)
Glucose: 115 mg/dL — ABNORMAL HIGH (ref 65–99)
POTASSIUM: 4.3 mmol/L (ref 3.5–5.2)
Sodium: 140 mmol/L (ref 134–144)

## 2017-12-17 ENCOUNTER — Telehealth: Payer: Self-pay | Admitting: *Deleted

## 2017-12-17 NOTE — Telephone Encounter (Signed)
Clarified with [facility] @ [facility] Scheduling, and she clarified that the pt is not required to do the premed regimen for his upcoming MRI since he hasn't had any allergic reactions to it.

## 2017-12-17 NOTE — Telephone Encounter (Signed)
Per [name], PA-C, called Keene Breath, the scheduler at [facility], re: pt's MRI and to find out their protocol re: allergic reaction.

## 2017-12-18 ENCOUNTER — Encounter (HOSPITAL_COMMUNITY): Payer: Self-pay

## 2017-12-18 ENCOUNTER — Ambulatory Visit (HOSPITAL_COMMUNITY)
Admission: RE | Admit: 2017-12-18 | Discharge: 2017-12-18 | Disposition: A | Discharge status: Home / Self Care | Payer: Medicare Other | Source: Ambulatory Visit | Attending: Physician Assistant | Admitting: Physician Assistant

## 2017-12-18 DIAGNOSIS — I719 Aortic aneurysm of unspecified site, without rupture: Secondary | ICD-10-CM

## 2017-12-18 NOTE — Progress Notes (Signed)
Pt came for his exam and was medicated prior to start time.  Pt was placed on the table and put into scanner where he needed to be for the exam.  Pt could not tolerate location of exam due to claustrophobia.

## 2017-12-22 ENCOUNTER — Encounter: Payer: Self-pay | Admitting: Cardiology

## 2017-12-22 ENCOUNTER — Ambulatory Visit (INDEPENDENT_AMBULATORY_CARE_PROVIDER_SITE_OTHER): Payer: Medicare Other | Admitting: Cardiology

## 2017-12-22 VITALS — BP 122/92 | HR 86 | Ht 71.0 in | Wt 231.0 lb

## 2017-12-22 DIAGNOSIS — G4733 Obstructive sleep apnea (adult) (pediatric): Secondary | ICD-10-CM | POA: Diagnosis not present

## 2017-12-22 DIAGNOSIS — I2510 Atherosclerotic heart disease of native coronary artery without angina pectoris: Secondary | ICD-10-CM

## 2017-12-22 DIAGNOSIS — I4819 Other persistent atrial fibrillation: Secondary | ICD-10-CM

## 2017-12-22 DIAGNOSIS — I10 Essential (primary) hypertension: Secondary | ICD-10-CM

## 2017-12-22 DIAGNOSIS — I481 Persistent atrial fibrillation: Secondary | ICD-10-CM | POA: Diagnosis not present

## 2017-12-22 NOTE — Progress Notes (Addendum)
Electrophysiology Office Note   Date:  12/22/2017   ID:  Troy [name], DOB 05/18/1951, MRN [id number]  PCP:  Velna Hatchet, MD  Cardiologist:  Radford Pax Primary Electrophysiologist:  Will Meredith Leeds, MD    Chief Complaint  Patient presents with  . Follow-up    Persistent Afib     History of Present Illness: Troy [name] is a 67 y.o. male who is being seen today for the evaluation of atrial fibrillation at the request of Velna Hatchet, MD. Presenting today for electrophysiology evaluation. H/o 06/2015 DES mLAD &RPDA w/ VF arrest, PCI in [place] 2010, HTN, HLD, MR, PAF, pulm nodule stable 07/2016. He was recently loaded on amiodarone and  successfully cardioverted to SR around 50 bpm. Patient went back into AF on 02/13/17. His main complaint was shortness of breath. Had repeat cardioversion 02/20/17.   Since last being seen, he has been doing well. He stopped his amiodarone 2 weeks ago. He has not noticed many symptoms of atrial fibrillation. 10 days ago, he did have an episode of palpitations. He sat down and neck some water and the palpitations resolved on their own. His chest pain has improved. He currently takes Imdur, but he has been dividing his 90 mg dose in half and taking it twice a day. His shortness of breath is improved since stopping amiodarone.  Today, denies symptoms of palpitations, chest pain, shortness of breath, orthopnea, PND, lower extremity edema, claudication, dizziness, presyncope, syncope, bleeding, or neurologic sequela. The patient is tolerating medications without difficulties.  He is currently feeling well without complaint.    Past Medical History:  Diagnosis Date  . Arthritis    "knees" (01/02/2017)  . CAD (coronary artery disease)    a. s/p PCI in 2010 in [place]. b. Unstable angina/LHC 01/16/1828 complicated by V fib arrest after contrast injection. Cath 06/19/2015 s/p DES to mid LAD and RPDA. c. 11/2016: chest pain/ abnormal nuclear stress test  prompting cath 11/13/16 showing 80% ostial diag, 60% mid RCA, no acute lesions, medical therapy recommended.  . Dyslipidemia   . Essential hypertension   . Lung nodule < 6cm on CT 06/16/15   Right lung  . Mitral regurgitation    a. Mild-mod by echo 06/2015. b. not seen other than trivial in 2018.  . OSA on CPAP   . Persistent atrial fibrillation ([place])    a. 06/2015 noted to be in new a-fib when arrived with unstable angina. Converted to NSR after being shocked in the cath lab for vfib;  b. 06/2015 Eliquis initiated as PAF noted on event monitor. c. Recurrent atrial fib?flutter 10/2016, issues with recurrent AF requiring multiple DCCVs in 02/2017. Failed sotalol, not candidate for Tikosyn due to cost/QTC, Multaq not felt likely strong enough.  . Pulmonary nodule 07/2015   a. 1.5 x 1.2 cm smoothly marginated nodule in the central aspect of the right lower lobe with recommendation correlation with nonemergent PET-CT to exclude a neoplasm , stable by CT 07/2016  . Severe left ventricular hypertrophy   . Ventricular fibrillation ([place])    a. occured during cath 06/18/2015.   Past Surgical History:  Procedure Laterality Date  . CARDIAC CATHETERIZATION N/A 06/18/2015   Procedure: Left Heart Cath and Coronary Angiography;  Surgeon: Burnell Blanks, MD;  Location: [place] [facility];  Service: Cardiovascular;  Laterality: N/A;  . CARDIAC CATHETERIZATION N/A 06/19/2015   Procedure: Coronary Stent Intervention;  Surgeon: Peter M Martinique, MD;  Location: [place] [facility];  Service: Cardiovascular;  Laterality:  N/A;  . CARDIAC CATHETERIZATION N/A 11/13/2016   Procedure: Left Heart Cath and Coronary Angiography;  Surgeon: Lorretta Harp, MD;  Location: [facility];  Service: Cardiovascular;  Laterality: N/A;  . CARDIOVERSION N/A 11/17/2016   Procedure: CARDIOVERSION;  Surgeon: Larey Dresser, MD;  Location: [facility];  Service: Cardiovascular;  Laterality: N/A;  . CARDIOVERSION N/A 01/03/2017    Procedure: CARDIOVERSION;  Surgeon: Lelon Perla, MD;  Location: [place];  Service: Cardiovascular;  Laterality: N/A;  . CARDIOVERSION N/A 02/05/2017   Procedure: Cardioversion;  Surgeon: Evans Lance, MD;  Location: [place] [facility];  Service: Cardiovascular;  Laterality: N/A;  . CARDIOVERSION N/A 02/20/2017   Procedure: Cardioversion;  Surgeon: Evans Lance, MD;  Location: [place] [facility];  Service: Cardiovascular;  Laterality: N/A;  . CARTILAGE SURGERY Bilateral    "thumbs"  . CATARACT EXTRACTION W/ INTRAOCULAR LENS  IMPLANT, BILATERAL Bilateral   . CORONARY ANGIOPLASTY WITH STENT PLACEMENT  ~ 2007  . JOINT REPLACEMENT    . KNEE ARTHROSCOPY Right   . RETINAL DETACHMENT SURGERY Bilateral    "laser thing"  . TEE WITHOUT CARDIOVERSION N/A 11/17/2016   Procedure: TRANSESOPHAGEAL ECHOCARDIOGRAM (TEE);  Surgeon: Larey Dresser, MD;  Location: [place];  Service: Cardiovascular;  Laterality: N/A;  . TOTAL KNEE ARTHROPLASTY Left 2000s     Current Outpatient Medications  Medication Sig Dispense Refill  . atorvastatin (LIPITOR) 80 MG tablet Take 1 tablet (80 mg total) by mouth daily. 90 tablet 3  . ezetimibe (ZETIA) 10 MG tablet Take 1 tablet (10 mg total) by mouth daily. 90 tablet 3  . LORazepam (ATIVAN) 0.5 MG tablet Take 0.5 mg by mouth daily as needed for anxiety.    Marland Kitchen losartan (COZAAR) 100 MG tablet Take 1 tablet (100 mg total) by mouth daily. 30 tablet 2  . magnesium gluconate (MAGONATE) 500 MG tablet Take 1,000 mg by mouth 2 (two) times daily.    . metoprolol (LOPRESSOR) 100 MG tablet Take one-half tablet (50 mg) by mouth twice daily    . nitroGLYCERIN (NITROSTAT) 0.3 MG SL tablet Place 1 tablet (0.3 mg total) under the tongue every 5 (five) minutes as needed for chest pain. 90 tablet 6  . Omega-3 Fatty Acids (FISH OIL) 1000 MG CPDR Take 2,000 mg by mouth 2 (two) times daily.     Marland Kitchen PRESCRIPTION MEDICATION Inhale into the lungs at bedtime. CPAP    . spironolactone (ALDACTONE)  25 MG tablet Take 1 tablet (25 mg total) by mouth daily. 30 tablet 3  . TURMERIC PO Take 2 tablets by mouth 2 (two) times daily.     Alveda Reasons 20 MG TABS tablet TAKE 1 TABLET(20 MG) BY MOUTH DAILY WITH SUPPER 30 tablet 5   No current facility-administered medications for this visit.     Allergies:   Contrast media [iodinated diagnostic agents]; Gluten meal; Metrizamide; Tizanidine; Whey; and Morphine and related   Social History:  The patient  reports that  has never smoked. he has never used smokeless tobacco. He reports that he drinks about 1.2 oz of alcohol per week. He reports that he does not use drugs.   Family History:  The patient's family history includes Diabetes in his sister; Drug abuse in his father; Emphysema in his mother; Heart attack in his father, maternal grandfather, and paternal grandfather; Lung cancer in his mother; Stroke in his mother.    ROS:  Please see the history of present illness.   Otherwise,  review of systems is positive for depression.   All other systems are reviewed and negative.   PHYSICAL EXAM: VS:  BP (!) 122/92   Pulse 86   Ht 5\' 11"  (1.803 m)   Wt 231 lb (104.8 kg)   SpO2 97%   BMI 32.22 kg/m  , BMI Body mass index is 32.22 kg/m. GEN: Well nourished, well developed, in no acute distress  HEENT: normal  Neck: no JVD, carotid bruits, or masses Cardiac: RRR; no murmurs, rubs, or gallops,no edema  Respiratory:  clear to auscultation bilaterally, normal work of breathing GI: soft, nontender, nondistended, + BS MS: no deformity or atrophy  Skin: warm and dry Neuro:  Strength and sensation are intact Psych: euthymic mood, full affect  EKG:  EKG is not ordered today. Personal review of the ekg ordered 11/16/17 shows SR, rate 56    Recent Labs: 02/11/2017: NT-Pro BNP 532 11/16/2017: ALT 19; Hemoglobin 15.3; Magnesium 2.3; Platelets 168; TSH 1.300 12/16/2017: BUN 21; Creatinine, Ser 1.03; Potassium 4.3; Sodium 140    Lipid Panel     Component  Value Date/Time   CHOL 201 (H) 11/16/2017 1027   TRIG 178 (H) 11/16/2017 1027   HDL 49 11/16/2017 1027   CHOLHDL 4.1 11/16/2017 1027   CHOLHDL 2.5 08/15/2015 0912   VLDL 15 08/15/2015 0912   LDLCALC 116 (H) 11/16/2017 1027     Wt Readings from Last 3 Encounters:  12/22/17 231 lb (104.8 kg)  11/16/17 230 lb 6.4 oz (104.5 kg)  07/30/17 238 lb (108 kg)      Other studies Reviewed: Additional studies/ records that were reviewed today include: TTE 11/18/16  Review of the above records today demonstrates:  - Left ventricle: The cavity size was normal. There was severe   concentric hypertrophy. Systolic function was normal. The   estimated ejection fraction was in the range of 60% to 65%. Wall   motion was normal; there were no regional wall motion   abnormalities. The study is not technically sufficient to allow   evaluation of LV diastolic function. - Aortic valve: Trileaflet; mildly calcified leaflets. There was   mild regurgitation. - Mitral valve: Mildly thickened leaflets . There was trivial   regurgitation. - Left atrium: Moderately dilated. - Right ventricle: The cavity size was mildly dilated. - Right atrium: Severely dilated. - Tricuspid valve: There was trivial regurgitation. - Pulmonary arteries: PA peak pressure: 20 mm Hg (S). - Inferior vena cava: The vessel was normal in size. The   respirophasic diameter changes were in the normal range (= 50%),   consistent with normal central venous pressure.  LHC 11/13/16  Ost RPDA to RPDA lesion, 0 %stenosed.  Prox Cx to Mid Cx lesion, 0 %stenosed.  Prox LAD to Mid LAD lesion, 0 %stenosed.  Ost 1st Diag lesion, 80 %stenosed.  Mid RCA lesion, 60 %stenosed.  ASSESSMENT AND PLAN:  1.  Persistent atrial fibrillation: Xarelto.  Has remained in sinus rhythm since his cardioversion.  He does not wish to be on further medical management.  Should he go back into atrial fibrillation, would plan for ablation.  Blood pressure well  controlled today.  No changes.  This patients CHA2DS2-VASc Score and unadjusted Ischemic Stroke Rate (% per year) is equal to 3.2 % stroke rate/year from a score of 3  Above score calculated as 1 point each if present [CHF, HTN, DM, Vascular=MI/PAD/Aortic Plaque, Age if 65-74, or Male] Above score calculated as 2 points each if present [Age > 75,  or Stroke/TIA/TE]   2. Hypertension: Pressure well controlled today.  No changes.  3. Coronary artery disease with angina: Currently no chest pain.  Continue with current management.  4. Obstructive sleep apnea: CPAP compliance encouraged  5.  Preoperative risk assessment: Plan for TKA on 02/08/18.  The patient has atrial fibrillation as well as coronary artery disease.  Both diseases have been stable.  He would thus be intermediate risk for any risk procedure.  We will plan to hold Xarelto for 3 days prior to the procedure if spinal sedation is used.  He is also plan to have oral surgery.  He would be at intermediate risk for a low risk oral surgical procedure.  Would also be able to hold Xarelto for 3 days with restarting immediately thereafter per the surgeon.  Current medicines are reviewed at length with the patient today.   The patient does not have concerns regarding his medicines.  The following changes were made today:  none  Labs/ tests ordered today include:  No orders of the defined types were placed in this encounter.    Disposition:   FU with Will Camnitz 12 months  Signed, Will Meredith Leeds, MD  12/22/2017 11:30 AM     [facility] [address] [place] [address] [phone] (office) [phone] (fax)

## 2017-12-22 NOTE — Patient Instructions (Signed)
Medication Instructions:  Your physician recommends that you continue on your current medications as directed. Please refer to the Current Medication list given to you today.  * If you need a refill on your cardiac medications before your next appointment, please call your pharmacy.   Labwork: None ordered  Testing/Procedures: None ordered  Follow-Up: Your physician wants you to follow-up in: 1 year with Dr. Camnitz.  You will receive a reminder letter in the mail two months in advance. If you don't receive a letter, please call our office to schedule the follow-up appointment.  Thank you for choosing [facility]!!   Dorr Perrot, RN [phone]        

## 2017-12-28 ENCOUNTER — Other Ambulatory Visit: Payer: Medicare Other

## 2017-12-29 ENCOUNTER — Telehealth: Payer: Self-pay | Admitting: Cardiology

## 2017-12-29 NOTE — Telephone Encounter (Signed)
Per Pre-Op Protocol I s/w Dayna Dunn, PA in regards to clearance for pt. Please see note from [facility], [place] 12/14/17. PA states pt has upcoming appt with Dr. Curt Bears 12/22/17 which at that time both surgeries for the pt will need to be addressed as well as his Xarelto instructions for his surgeries. I went to [name], [place] today stating pt calling stating [facility] has received clearance. I reviewed Dr. Curt Bears ov note from 12/22/17 where he does state pt plan of care for TKA on 02/08/18 and he advised the pt will need to hold Xarelto for 3 days prior to his TKA. I did not see where he gave clearance for the Oral surgery. Per [name], PA this should be fine however she asked for me to please send a note to Dr. Curt Bears to please addend his 12/22/17 to document about the oral surgery with [place].

## 2017-12-29 NOTE — Telephone Encounter (Signed)
New message   Patient calling to follow up on preop clearance. No information has been sent to: Practice name and name of physician performing surgery? [facility]   What is your office phone and fax number? Phone: [phone]  [phone]

## 2017-12-30 NOTE — Telephone Encounter (Signed)
Informed patient that I would be faxing clearance to Oral surgeon this afternoon and Orthopedic tomorrow. Pt is appreciative of the help.

## 2017-12-30 NOTE — Telephone Encounter (Signed)
Fax confirmation received. 

## 2017-12-30 NOTE — Telephone Encounter (Signed)
New Message    Patient called about surgical clearance not being sent in timely manner , he is in a lot of pain and needs to have oral surgery, he is going to have to go to ER if his oral surgery is not address quickly.

## 2017-12-30 NOTE — Telephone Encounter (Signed)
Per Dr. Curt Bears:  Pt is cleared for extraction/bone graft surgery at [facility]. He should stop his Xarelto 1 day prior to procedure. Dentist to instruct patient on when to restart Xarelto post procedure. [place] for IV general anesthesia.   Will attach office visit note (stating clearance) to fax.

## 2017-12-31 ENCOUNTER — Telehealth: Payer: Self-pay | Admitting: *Deleted

## 2017-12-31 NOTE — Telephone Encounter (Signed)
Troy [name] [facility]  12/15/17           9:31 AM  Note    Pt takes Xarelto for afib with CHADS2VASc score of 3 (age, CAD, HTN). Pt underwent DCCV on 11/17/16. Renal function is normal.  For upcoming TKA procedure on 02/08/18, recommend holding Xarelto for 3 days prior to procedure since spinal sedation is typically used.

## 2017-12-31 NOTE — Telephone Encounter (Signed)
Received fax confirmation

## 2017-12-31 NOTE — Progress Notes (Signed)
Need orders in epic for 02-08-18 in epic for surgery

## 2017-12-31 NOTE — Telephone Encounter (Signed)
12/30/2017: Note    Per Dr. Curt Bears:  Pt is cleared for extraction/bone graft surgery at [facility]. He should stop his Xarelto 1 day prior to procedure. Dentist to instruct patient on when to restart Xarelto post procedure. [place] for IV general anesthesia.   Will attach office visit note (stating clearance) to fax.     Faxed confirmation received.

## 2017-12-31 NOTE — Telephone Encounter (Signed)
Per [employer] office note: Preoperative risk assessment: Plan for TKA on 02/08/18.  The patient has atrial fibrillation as well as coronary artery disease.  Both diseases have been stable.  He would thus be intermediate risk for procedure.  We will plan to hold Xarelto for 3 days prior to the procedure if spinal sedation is used.  He is also plan to have oral surgery.  He would be at intermediate risk for a low risk oral surgical procedure.  Would also be able to hold Xarelto for 3 days with restarting immediately thereafter per the surgeon.

## 2017-12-31 NOTE — Telephone Encounter (Signed)
Teressa Senter, RN   12/01/2017   1:04 PM  Note        [facility] Pre-operative Risk Assessment    Request for surgical clearance:  1. What type of surgery is being performed? Right TKA-medial and lateral w/wo patella resurfacing    2. When is this surgery scheduled? 02/08/18  3. What type of clearance is required (medical clearance vs. Pharmacy clearance to hold med vs. Both)?  Medical clearance  4. Are there any medications that need to be held prior to surgery and how long? Please instruct on Xarelto prior to surgery   5. Practice name and name of physician performing surgery?  [facility] 2. Dr. Pilar Plate Aluisio  6. What is your office phone and fax number? 1. Phone: [phone] 2. Fax: [place]    7. Anesthesia type (None, local, MAC, general) ? None specified    _________________________________________________________________   (provider comments below)

## 2017-12-31 NOTE — Telephone Encounter (Addendum)
Pt cleared for upcoming TKA scheduled for 4/1 Will fax this note and last [place] office note to [facility] & forward to Dr. Wynelle Link via [facility]

## 2018-01-03 ENCOUNTER — Ambulatory Visit: Payer: Self-pay | Admitting: Orthopedic Surgery

## 2018-01-06 DIAGNOSIS — M1711 Unilateral primary osteoarthritis, right knee: Secondary | ICD-10-CM | POA: Diagnosis not present

## 2018-01-07 DIAGNOSIS — H43811 Vitreous degeneration, right eye: Secondary | ICD-10-CM | POA: Diagnosis not present

## 2018-01-07 DIAGNOSIS — H43391 Other vitreous opacities, right eye: Secondary | ICD-10-CM | POA: Diagnosis not present

## 2018-01-07 DIAGNOSIS — H3581 Retinal edema: Secondary | ICD-10-CM | POA: Diagnosis not present

## 2018-01-07 DIAGNOSIS — H35372 Puckering of macula, left eye: Secondary | ICD-10-CM | POA: Diagnosis not present

## 2018-01-08 ENCOUNTER — Telehealth: Payer: Self-pay | Admitting: Cardiology

## 2018-01-08 NOTE — Telephone Encounter (Signed)
° °  [facility] Pre-operative Risk Assessment    Request for surgical clearance:  1. What type of surgery is being performed? Retina Surgery for Macular Pucker   2. When is this surgery scheduled? 01-26-18   3. What type of clearance is required (medical clearance vs. Pharmacy clearance to hold med vs. Both)? Pharmacy Clearance  4. Are there any medications that need to be held prior to surgery and how long?Need to hold Xarelto for 3 days prior to surgery   5. Practice name and name of physician performing surgery? Dr Ernst Breach   6. What is your office phone and fax number? [phone] and fax is [phone]   7. Anesthesia type (None, local, MAC, general) ? MAC   Troy [name] 01/08/2018, 11:34 AM  _________________________________________________________________   (provider comments below)

## 2018-01-11 NOTE — Telephone Encounter (Signed)
Patient with diagnosis of Afib on Xarelto for anticoagulation.    Procedure: Retina Macular Pucker Date of procedure: 01/26/18  CHADS2-VASc score of  3 (CHF, HTN, AGE, DM2, stroke/tia x 2, CAD, AGE, male)  CrCl 154ml/min  Per office protocol, patient can hold Xarelto for 24 hours prior to procedure.

## 2018-02-01 NOTE — Patient Instructions (Addendum)
Troy [name]  02/01/2018   Your procedure is scheduled on: 02-08-18   Report to [facility] Main  Entrance Report to Admitting at 8:00 AM    Call this number if you have problems the morning of surgery [phone]   Remember: Do not eat food or drink liquids :After Midnight.     Take these medicines the morning of surgery with A SIP OF WATER: Metoprolol (Lopressor), Atorvastatin (Lipitor), and Ativan as needed. You may also bring and use your nasal spray as needed.                                You may not have any metal on your body including hair pins and              piercings  Do not wear jewelry, lotions, powders or deodorant             Men may shave face and neck.   Do not bring valuables to the hospital. [place].  Contacts, dentures or bridgework may not be worn into surgery.  Leave suitcase in the car. After surgery it may be brought to your room.                 Please read over the following fact sheets you were given: _____________________________________________________________________          [facility] - Preparing for Surgery Before surgery, you can play an important role.  Because skin is not sterile, your skin needs to be as free of germs as possible.  You can reduce the number of germs on your skin by washing with CHG (chlorahexidine gluconate) soap before surgery.  CHG is an antiseptic cleaner which kills germs and bonds with the skin to continue killing germs even after washing. Please DO NOT use if you have an allergy to CHG or antibacterial soaps.  If your skin becomes reddened/irritated stop using the CHG and inform your nurse when you arrive at Short Stay. Do not shave (including legs and underarms) for at least 48 hours prior to the first CHG shower.  You may shave your face/neck. Please follow these instructions carefully:  1.  Shower with CHG Soap the night before surgery and the   morning of Surgery.  2.  If you choose to wash your hair, wash your hair first as usual with your  normal  shampoo.  3.  After you shampoo, rinse your hair and body thoroughly to remove the  shampoo.                           4.  Use CHG as you would any other liquid soap.  You can apply chg directly  to the skin and wash                       Gently with a scrungie or clean washcloth.  5.  Apply the CHG Soap to your body ONLY FROM THE NECK DOWN.   Do not use on face/ open  Wound or open sores. Avoid contact with eyes, ears mouth and genitals (private parts).                       Wash face,  Genitals (private parts) with your normal soap.             6.  Wash thoroughly, paying special attention to the area where your surgery  will be performed.  7.  Thoroughly rinse your body with warm water from the neck down.  8.  DO NOT shower/wash with your normal soap after using and rinsing off  the CHG Soap.                9.  Pat yourself dry with a clean towel.            10.  Wear clean pajamas.            11.  Place clean sheets on your bed the night of your first shower and do not  sleep with pets. Day of Surgery : Do not apply any lotions/deodorants the morning of surgery.  Please wear clean clothes to the hospital/surgery center.  FAILURE TO FOLLOW THESE INSTRUCTIONS MAY RESULT IN THE CANCELLATION OF YOUR SURGERY PATIENT SIGNATURE_________________________________  NURSE SIGNATURE__________________________________  ________________________________________________________________________   Adam Phenix  An incentive spirometer is a tool that can help keep your lungs clear and active. This tool measures how well you are filling your lungs with each breath. Taking long deep breaths may help reverse or decrease the chance of developing breathing (pulmonary) problems (especially infection) following:  A long period of time when you are unable to move or be  active. BEFORE THE PROCEDURE   If the spirometer includes an indicator to show your best effort, your nurse or respiratory therapist will set it to a desired goal.  If possible, sit up straight or lean slightly forward. Try not to slouch.  Hold the incentive spirometer in an upright position. INSTRUCTIONS FOR USE  1. Sit on the edge of your bed if possible, or sit up as far as you can in bed or on a chair. 2. Hold the incentive spirometer in an upright position. 3. Breathe out normally. 4. Place the mouthpiece in your mouth and seal your lips tightly around it. 5. Breathe in slowly and as deeply as possible, raising the piston or the ball toward the top of the column. 6. Hold your breath for 3-5 seconds or for as long as possible. Allow the piston or ball to fall to the bottom of the column. 7. Remove the mouthpiece from your mouth and breathe out normally. 8. Rest for a few seconds and repeat Steps 1 through 7 at least 10 times every 1-2 hours when you are awake. Take your time and take a few normal breaths between deep breaths. 9. The spirometer may include an indicator to show your best effort. Use the indicator as a goal to work toward during each repetition. 10. After each set of 10 deep breaths, practice coughing to be sure your lungs are clear. If you have an incision (the cut made at the time of surgery), support your incision when coughing by placing a pillow or rolled up towels firmly against it. Once you are able to get out of bed, walk around indoors and cough well. You may stop using the incentive spirometer when instructed by your caregiver.  RISKS AND COMPLICATIONS  Take your time so you do not get  dizzy or light-headed.  If you are in pain, you may need to take or ask for pain medication before doing incentive spirometry. It is harder to take a deep breath if you are having pain. AFTER USE  Rest and breathe slowly and easily.  It can be helpful to keep track of a log of  your progress. Your caregiver can provide you with a simple table to help with this. If you are using the spirometer at home, follow these instructions: [place] IF:   You are having difficultly using the spirometer.  You have trouble using the spirometer as often as instructed.  Your pain medication is not giving enough relief while using the spirometer.  You develop fever of 100.5 F (38.1 C) or higher. SEEK IMMEDIATE MEDICAL CARE IF:   You cough up bloody sputum that had not been present before.  You develop fever of 102 F (38.9 C) or greater.  You develop worsening pain at or near the incision site. MAKE SURE YOU:   Understand these instructions.  Will watch your condition.  Will get help right away if you are not doing well or get worse. Document Released: 03/09/2007 Document Revised: 01/19/2012 Document Reviewed: 05/10/2007 ExitCare Patient Information 2014 ExitCare, [place].   ________________________________________________________________________  WHAT IS A BLOOD TRANSFUSION? Blood Transfusion Information  A transfusion is the replacement of blood or some of its parts. Blood is made up of multiple cells which provide different functions.  Red blood cells carry oxygen and are used for blood loss replacement.  White blood cells fight against infection.  Platelets control bleeding.  Plasma helps clot blood.  Other blood products are available for specialized needs, such as hemophilia or other clotting disorders. BEFORE THE TRANSFUSION  Who gives blood for transfusions?   Healthy volunteers who are fully evaluated to make sure their blood is safe. This is blood bank blood. Transfusion therapy is the safest it has ever been in the practice of medicine. Before blood is taken from a donor, a complete history is taken to make sure that person has no history of diseases nor engages in risky social behavior (examples are intravenous drug use or sexual activity  with multiple partners). The donor's travel history is screened to minimize risk of transmitting infections, such as malaria. The donated blood is tested for signs of infectious diseases, such as HIV and hepatitis. The blood is then tested to be sure it is compatible with you in order to minimize the chance of a transfusion reaction. If you or a relative donates blood, this is often done in anticipation of surgery and is not appropriate for emergency situations. It takes many days to process the donated blood. RISKS AND COMPLICATIONS Although transfusion therapy is very safe and saves many lives, the main dangers of transfusion include:   Getting an infectious disease.  Developing a transfusion reaction. This is an allergic reaction to something in the blood you were given. Every precaution is taken to prevent this. The decision to have a blood transfusion has been considered carefully by your caregiver before blood is given. Blood is not given unless the benefits outweigh the risks. AFTER THE TRANSFUSION  Right after receiving a blood transfusion, you will usually feel much better and more energetic. This is especially true if your red blood cells have gotten low (anemic). The transfusion raises the level of the red blood cells which carry oxygen, and this usually causes an energy increase.  The nurse administering the transfusion will  monitor you carefully for complications. HOME CARE INSTRUCTIONS  No special instructions are needed after a transfusion. You may find your energy is better. Speak with your caregiver about any limitations on activity for underlying diseases you may have. SEEK MEDICAL CARE IF:   Your condition is not improving after your transfusion.  You develop redness or irritation at the intravenous (IV) site. SEEK IMMEDIATE MEDICAL CARE IF:  Any of the following symptoms occur over the next 12 hours:  Shaking chills.  You have a temperature by mouth above 102 F (38.9  C), not controlled by medicine.  Chest, back, or muscle pain.  People around you feel you are not acting correctly or are confused.  Shortness of breath or difficulty breathing.  Dizziness and fainting.  You get a rash or develop hives.  You have a decrease in urine output.  Your urine turns a dark color or changes to pink, red, or brown. Any of the following symptoms occur over the next 10 days:  You have a temperature by mouth above 102 F (38.9 C), not controlled by medicine.  Shortness of breath.  Weakness after normal activity.  The white part of the eye turns yellow (jaundice).  You have a decrease in the amount of urine or are urinating less often.  Your urine turns a dark color or changes to pink, red, or brown. Document Released: 10/24/2000 Document Revised: 01/19/2012 Document Reviewed: 06/12/2008 [employer] Patient Information 2014 [place], [place].  _______________________________________________________________________

## 2018-02-01 NOTE — Progress Notes (Signed)
12-31-17 (Epic) Cardiac clearance from Dr. Judithe [place] in Telephone Encounter   11-24-17 (Epic) ECHO  11-16-17 (Epic) EKG  02-11-17 (Epic) CXR

## 2018-02-02 ENCOUNTER — Encounter (HOSPITAL_COMMUNITY)
Admission: RE | Admit: 2018-02-02 | Discharge: 2018-02-02 | Disposition: A | Discharge status: Home / Self Care | Payer: Medicare Other | Source: Ambulatory Visit | Attending: Orthopedic Surgery | Admitting: Orthopedic Surgery

## 2018-02-02 ENCOUNTER — Encounter (HOSPITAL_COMMUNITY): Payer: Self-pay

## 2018-02-02 ENCOUNTER — Other Ambulatory Visit: Payer: Self-pay

## 2018-02-02 DIAGNOSIS — M1711 Unilateral primary osteoarthritis, right knee: Secondary | ICD-10-CM | POA: Diagnosis not present

## 2018-02-02 DIAGNOSIS — Z01818 Encounter for other preprocedural examination: Secondary | ICD-10-CM | POA: Insufficient documentation

## 2018-02-02 LAB — ABO/RH: ABO/RH(D): B POS

## 2018-02-02 LAB — PROTIME-INR
INR: 1.06
Prothrombin Time: 13.7 seconds (ref 11.4–15.2)

## 2018-02-02 LAB — CBC
HEMATOCRIT: 43.7 % (ref 39.0–52.0)
HEMOGLOBIN: 14.1 g/dL (ref 13.0–17.0)
MCH: 29.4 pg (ref 26.0–34.0)
MCHC: 32.3 g/dL (ref 30.0–36.0)
MCV: 91 fL (ref 78.0–100.0)
Platelets: 161 10*3/uL (ref 150–400)
RBC: 4.8 MIL/uL (ref 4.22–5.81)
RDW: 13.9 % (ref 11.5–15.5)
WBC: 5.8 10*3/uL (ref 4.0–10.5)

## 2018-02-02 LAB — COMPREHENSIVE METABOLIC PANEL
ALK PHOS: 42 U/L (ref 38–126)
ALT: 22 U/L (ref 17–63)
AST: 22 U/L (ref 15–41)
Albumin: 4 g/dL (ref 3.5–5.0)
Anion gap: 8 (ref 5–15)
BILIRUBIN TOTAL: 0.4 mg/dL (ref 0.3–1.2)
BUN: 25 mg/dL — AB (ref 6–20)
CO2: 25 mmol/L (ref 22–32)
Calcium: 9.4 mg/dL (ref 8.9–10.3)
Chloride: 107 mmol/L (ref 101–111)
Creatinine, Ser: 0.93 mg/dL (ref 0.61–1.24)
GFR calc Af Amer: >60 mL/min (ref >60)
GFR calc non Af Amer: >60 mL/min (ref >60)
GLUCOSE: 99 mg/dL (ref 65–99)
POTASSIUM: 4.5 mmol/L (ref 3.5–5.1)
Sodium: 140 mmol/L (ref 135–145)
TOTAL PROTEIN: 6.6 g/dL (ref 6.5–8.1)

## 2018-02-02 LAB — SURGICAL PCR SCREEN
MRSA, PCR: NEGATIVE
STAPHYLOCOCCUS AUREUS: NEGATIVE

## 2018-02-02 LAB — APTT: aPTT: 31 seconds (ref 24–36)

## 2018-02-02 NOTE — Progress Notes (Signed)
02-02-18 CMP result routed to Dr. Wynelle Link for review

## 2018-02-03 DIAGNOSIS — H3581 Retinal edema: Secondary | ICD-10-CM | POA: Diagnosis not present

## 2018-02-03 DIAGNOSIS — I10 Essential (primary) hypertension: Secondary | ICD-10-CM | POA: Diagnosis not present

## 2018-02-03 DIAGNOSIS — Z48810 Encounter for surgical aftercare following surgery on the sense organs: Secondary | ICD-10-CM | POA: Diagnosis not present

## 2018-02-03 DIAGNOSIS — E669 Obesity, unspecified: Secondary | ICD-10-CM | POA: Diagnosis not present

## 2018-02-03 DIAGNOSIS — G4730 Sleep apnea, unspecified: Secondary | ICD-10-CM | POA: Diagnosis not present

## 2018-02-03 DIAGNOSIS — Z6831 Body mass index (BMI) 31.0-31.9, adult: Secondary | ICD-10-CM | POA: Diagnosis not present

## 2018-02-03 DIAGNOSIS — Z955 Presence of coronary angioplasty implant and graft: Secondary | ICD-10-CM | POA: Diagnosis not present

## 2018-02-03 DIAGNOSIS — I2510 Atherosclerotic heart disease of native coronary artery without angina pectoris: Secondary | ICD-10-CM | POA: Diagnosis not present

## 2018-02-03 DIAGNOSIS — I4891 Unspecified atrial fibrillation: Secondary | ICD-10-CM | POA: Diagnosis not present

## 2018-02-03 DIAGNOSIS — E785 Hyperlipidemia, unspecified: Secondary | ICD-10-CM | POA: Diagnosis not present

## 2018-02-03 DIAGNOSIS — Z7901 Long term (current) use of anticoagulants: Secondary | ICD-10-CM | POA: Diagnosis not present

## 2018-02-03 DIAGNOSIS — H35372 Puckering of macula, left eye: Secondary | ICD-10-CM | POA: Diagnosis not present

## 2018-02-03 DIAGNOSIS — Z79899 Other long term (current) drug therapy: Secondary | ICD-10-CM | POA: Diagnosis not present

## 2018-02-03 DIAGNOSIS — Z91041 Radiographic dye allergy status: Secondary | ICD-10-CM | POA: Diagnosis not present

## 2018-02-07 ENCOUNTER — Ambulatory Visit: Payer: Self-pay | Admitting: Orthopedic Surgery

## 2018-02-07 MED ORDER — BUPIVACAINE LIPOSOME 1.3 % IJ SUSP
20.0000 mL | Freq: Once | INTRAMUSCULAR | Status: DC
Start: 2018-02-08 — End: 2018-02-08
  Filled 2018-02-07: qty 20

## 2018-02-07 NOTE — Anesthesia Preprocedure Evaluation (Addendum)
Anesthesia Evaluation  Patient identified by MRN, date of birth, ID band Patient awake    Reviewed: Allergy & Precautions, H&P , Patient's Chart, lab work & pertinent test results  Airway Mallampati: II  TM Distance: >3 FB Neck ROM: Full    Dental no notable dental hx.    Pulmonary neg pulmonary ROS,    Pulmonary exam normal breath sounds clear to auscultation       Cardiovascular Exercise Tolerance: Good hypertension, + CAD  negative cardio ROS Normal cardiovascular exam Rhythm:Regular Rate:Normal  Echo 1-19- ef 55-60% LVH   Neuro/Psych negative neurological ROS  negative psych ROS   GI/Hepatic Neg liver ROS,   Endo/Other  negative endocrine ROS  Renal/GU negative Renal ROS     Musculoskeletal  (+) Arthritis , Osteoarthritis,    Abdominal   Peds  Hematology negative hematology ROS (+)   Anesthesia Other Findings   Reproductive/Obstetrics                            Lab Results  Component Value Date   WBC 5.8 02/02/2018   HGB 14.1 02/02/2018   HCT 43.7 02/02/2018   MCV 91.0 02/02/2018   PLT 161 02/02/2018   Lab Results  Component Value Date   CREATININE 0.93 02/02/2018   BUN 25 (H) 02/02/2018   NA 140 02/02/2018   K 4.5 02/02/2018   CL 107 02/02/2018   CO2 25 02/02/2018    Anesthesia Physical Anesthesia Plan  ASA: III  Anesthesia Plan: Regional and Spinal   Post-op Pain Management:  Regional for Post-op pain   Induction:   PONV Risk Score and Plan: Treatment may vary due to age or medical condition and Ondansetron  Airway Management Planned: Mask, Natural Airway and Nasal Cannula  Additional Equipment:   Intra-op Plan:   Post-operative Plan:   Informed Consent: I have reviewed the patients History and Physical, chart, labs and discussed the procedure including the risks, benefits and alternatives for the proposed anesthesia with the patient or authorized  representative who has indicated his/her understanding and acceptance.     Plan Discussed with: CRNA  Anesthesia Plan Comments:         Anesthesia Quick Evaluation

## 2018-02-07 NOTE — H&P (View-Only) (Signed)
Name Troy [name], Troy [name] (83UD, M) DOB 1951/04/30    Chief Complaint Right Knee Pain H&P right total knee 02-08-2018  Patient's Care Team Primary Care Provider: Velna Hatchet MD: [address], [place], [place] [address], Ph [phone], Fax [phone] NPI: [id number] Cardiologist: Eber Hong TURNER: [place], [place], [place] [address], Ph [phone], Fax [phone] NPI: [id number] Patient's Pharmacies [facility]): [place], [place] [address], Ph [phone], Fax [phone]   Vitals Ht: 5 ft 11 in Wt: 230 lbs Stated  BMI: 32.1  BP: 152/82 sitting R arm Pulse: 64 bpm regular Pain Scale: 3   Allergies Reviewed Allergies GLUTEN  IODINATED CONTRAST- ORAL AND IV DYE  MORPHINE: Insomnia (Moderate)    Medications Reviewed Medications atorvastatin 80 mg tablet 11/18/17   filled PRESCRIPTION SOLUTIONS ezetimibe 10 mg tablet 12/14/17   filled PRESCRIPTION SOLUTIONS LORazepam 0.5 mg tablet 12/09/17   filled PRESCRIPTION SOLUTIONS losartan 100 mg tablet TK 1 T PO QD 08/22/17   filled surescripts metoprolol tartrate 100 mg tablet 12/14/17   filled PRESCRIPTION SOLUTIONS nitroglycerin 0.3 mg sublingual tablet PLACE 1 TABLET UNDER THE TONGUE Q 5 MINUTES PRN FOR CHEST PAIN 01/12/17   filled surescripts spironolactone 25 mg tablet TK 1 T PO QD 12/02/17   filled PRESCRIPTION SOLUTIONS traMADol 50 mg tablet TK 1 T PO TID PRN P 07/09/17   filled surescripts Xarelto 20 mg tablet 01/13/18   filled PRESCRIPTION SOLUTIONS    Problems Reviewed Problems Osteoarthritis of right knee joint  History of left total knee replacement   Family History Reviewed Family History Sister - Diabetes mellitus (onset age: 72)   - Arthritis (onset age: 30) Father - Parents deceased   - Drug abuse   - Myocardial infarction Mother - Parents deceased   - Pulmonary emphysema   - Malignant tumor of lung   - Cerebrovascular accident Maternal  Grandfather - Myocardial infarction Paternal Grandfather - Myocardial infarction   Social History Reviewed Social History Smoking Status: Never smoker Non-smoker Chewing tobacco: none Alcohol intake: Occasional Hand Dominance: Right Work related injury?: N Advance directive: N Medical Power of Attorney: N   Surgical History Reviewed Surgical History Cardioversion - Jan 2018, Feb 2018, March 2018, April 2018 Cataract - Bilateral Knee arthroscopy/surgery - Right Repair of retina for retinal detachment Knee Joint Replacement - Left - 2000's Angioplasty - 11/10/2014 Hand Surgery - 11/11/2007   Past Medical History Reviewed Past Medical History GERD/Reflux: Y Heart Problems: Y - CAD - Heart Stents Hypertension: Y Joint Pain: Y Notes: Tinnitus,  Anxiety,  Cataract,  Sleep Apnea,  Atrial Fibrillation,  Dyslipidemia,  Lung Nodule < 6cm on CT (Aug 2016),  OSA on CPAP,  Severe Left Ventricular Hypertrophy   HPI The patient is here today for a pre-operative History and Physical. They are scheduled for right total knee replacement on 02-08-2018 with Dr. Wynelle Link at [facility]. He has significant dysfunction and instability of the RIGHT knee. He does have some pain also but the dysfunction is worse than the pain. He had his LEFT knee replaced about 10 years ago and that was more for pain, but feels that the dysfunction in this RIGHT knee is as bad or worse than the pain he suffered from in his LEFT knee. The RIGHT knee is limiting what he can and cannot do. He feels like he cannot trust the knee. The brace helps a little but is not eliminating his problem. If anything his  problem has gotten much worse in the past year. He has pain but pain is not his most limiting factor now. The lack of function is more problematic for him. Injections will not be of any benefit and bracing has provided minimal benefit. At this point the most predictable means of improving his pain and  function is total knee arthroplasty. He is familiar with that but that of the procedure was 10 years ago and we discussed all the new innovations and total knee arthroplasty as well as the significant changes in pain management and rehab. He is at a stage where he wants to go ahead and proceed with the RIGHT total knee.   ROS CONSTITUTIONAL: no Fever, no Chills, no Night Sweats, no Weight Loss  CARDIOVASCULAR: no Cough, no Shortness of Breath, no COPD, no Asthma  GASTROINTESTINAL: no Vomiting, no Nausea  MUSCULOSKELETAL: JOINT PAIN, but no Swelling in Joints  NEUROLOGIC: no Numbness, no Tingling/Paresthesias, no Difficulty with Balance  Physical Exam Patient is a 67 year old male.  General Mental Status - Alert, cooperative and good historian. General Appearance - pleasant, Not in acute distress. Orientation - Oriented X3. Build & Nutrition - Well nourished and Well developed.  Head and Neck Head - normocephalic, atraumatic . Neck Global Assessment - supple, no bruit auscultated on the right, no bruit auscultated on the left.  Eye Pupil - Bilateral - PERR Motion - Bilateral - EOMI.  Chest and Lung Exam Auscultation Breath sounds - clear at anterior chest wall and clear at posterior chest wall. Adventitious sounds - No Adventitious sounds.  Cardiovascular Auscultation Rhythm - Regular rate and rhythm. Heart Sounds - S1 WNL and S2 WNL. Murmurs & Other Heart Sounds - Auscultation of the heart reveals - No Murmurs.  Abdomen Palpation/Percussion Tenderness - Abdomen is non-tender to palpation. Abdomen is soft. Auscultation Auscultation of the abdomen reveals - Bowel sounds normal.  Male Genitourinary Note: Not done, not pertinent to present illness  Musculoskeletal Examination of the right hip shows flexion to 120 rotation in 30 abduction 40 and external rotation of 40. There is no tenderness over the greater trochanter. There is no pain on provocative testing of the  hip.Evaluation of the left hip shows flexion to 120 rotation in 30 out 40 and abduction 40 without discomfort. There is no tenderness over the greater trochanter. There is no pain on provocative testing of the hip. His LEFT knee shows no swelling with range of motion 0-115 and no tenderness or instability. RIGHT knee shows no effusion. His range of motion is 5-125. He is tender medial and lateral. He has some AP laxity and slight varus valgus laxity. He has a significant antalgic gait pattern on the RIGHT. Pulses sensation and motor are intact.  Radiographs are reviewed AP and lateral both knees and a prosthesis on the LEFT is in good position with no abnormalities. On the RIGHT he has bone-on-bone arthritis medial and patellofemoral.   Assessment / Plan 1. Osteoarthritis of right knee joint M17.11: Unilateral primary osteoarthritis, right knee  Patient Instructions Surgical Plans: Right Total Knee Replacement  Disposition: Home with neighbor, Straight to Outpatient - [facility]  PCP: Dr. Velna Hatchet Cards: Dr. Fransico Him - 'plan to hold Xarelto for 3 days prior...' Topical TXA - CAD, Heart Stents Anesthesia Issues: None Patient was instructed on what medications to stop prior to surgery. - Follow up visit in 2 weeks with Dr. Wynelle Link - Begin physical therapy following surgery - Pre-operative lab work as pre Pre-Surgical Testing -  Prescriptions will be provided in hospital at time of discharge  Return to [place], MD for 5[facility] at [facility] on 02/23/2018 at 02:45 PM  Encounter signed-off by Mickel Crow, PA-C

## 2018-02-07 NOTE — H&P (Signed)
Name Troy [name], Troy [name] (56NI, M) DOB 01/21/1951    Chief Complaint Right Knee Pain H&P right total knee 02-08-2018  Patient's Care Team Primary Care Provider: Velna Hatchet MD: [address], [place], [place] [address], Ph [phone], Fax [phone] NPI: [id number] Cardiologist: Eber Hong TURNER: [address], [place], [place] [address], Ph [phone], Fax [phone] NPI: [id number] Patient's Pharmacies [facility] [facility]): [place], [place] [address], Ph [phone], Fax [phone]   Vitals Ht: 5 ft 11 in Wt: 230 lbs Stated  BMI: 32.1  BP: 152/82 sitting R arm Pulse: 64 bpm regular Pain Scale: 3   Allergies Reviewed Allergies GLUTEN  IODINATED CONTRAST- ORAL AND IV DYE  MORPHINE: Insomnia (Moderate)    Medications Reviewed Medications atorvastatin 80 mg tablet 11/18/17   filled PRESCRIPTION SOLUTIONS ezetimibe 10 mg tablet 12/14/17   filled PRESCRIPTION SOLUTIONS LORazepam 0.5 mg tablet 12/09/17   filled PRESCRIPTION SOLUTIONS losartan 100 mg tablet TK 1 T PO QD 08/22/17   filled surescripts metoprolol tartrate 100 mg tablet 12/14/17   filled PRESCRIPTION SOLUTIONS nitroglycerin 0.3 mg sublingual tablet PLACE 1 TABLET UNDER THE TONGUE Q 5 MINUTES PRN FOR CHEST PAIN 01/12/17   filled surescripts spironolactone 25 mg tablet TK 1 T PO QD 12/02/17   filled PRESCRIPTION SOLUTIONS traMADol 50 mg tablet TK 1 T PO TID PRN P 07/09/17   filled surescripts Xarelto 20 mg tablet 01/13/18   filled PRESCRIPTION SOLUTIONS    Problems Reviewed Problems Osteoarthritis of right knee joint  History of left total knee replacement   Family History Reviewed Family History Sister - Diabetes mellitus (onset age: 62)   - Arthritis (onset age: 30) Father - Parents deceased   - Drug abuse   - Myocardial infarction Mother - Parents deceased   - Pulmonary emphysema   - Malignant tumor of lung   - Cerebrovascular accident Maternal  Grandfather - Myocardial infarction Paternal Grandfather - Myocardial infarction   Social History Reviewed Social History Smoking Status: Never smoker Non-smoker Chewing tobacco: none Alcohol intake: Occasional Hand Dominance: Right Work related injury?: N Advance directive: N Medical Power of Attorney: N   Surgical History Reviewed Surgical History Cardioversion - Jan 2018, Feb 2018, March 2018, April 2018 Cataract - Bilateral Knee arthroscopy/surgery - Right Repair of retina for retinal detachment Knee Joint Replacement - Left - 2000's Angioplasty - 11/10/2014 Hand Surgery - 11/11/2007   Past Medical History Reviewed Past Medical History GERD/Reflux: Y Heart Problems: Y - CAD - Heart Stents Hypertension: Y Joint Pain: Y Notes: Tinnitus,  Anxiety,  Cataract,  Sleep Apnea,  Atrial Fibrillation,  Dyslipidemia,  Lung Nodule < 6cm on CT (Aug 2016),  OSA on CPAP,  Severe Left Ventricular Hypertrophy   HPI The patient is here today for a pre-operative History and Physical. They are scheduled for right total knee replacement on 02-08-2018 with Dr. Wynelle Link at [facility]. He has significant dysfunction and instability of the RIGHT knee. He does have some pain also but the dysfunction is worse than the pain. He had his LEFT knee replaced about 10 years ago and that was more for pain, but feels that the dysfunction in this RIGHT knee is as bad or worse than the pain he suffered from in his LEFT knee. The RIGHT knee is limiting what he can and cannot do. He feels like he cannot trust the knee. The brace helps a little but is not eliminating his problem. If anything his  problem has gotten much worse in the past year. He has pain but pain is not his most limiting factor now. The lack of function is more problematic for him. Injections will not be of any benefit and bracing has provided minimal benefit. At this point the most predictable means of improving his pain and  function is total knee arthroplasty. He is familiar with that but that of the procedure was 10 years ago and we discussed all the new innovations and total knee arthroplasty as well as the significant changes in pain management and rehab. He is at a stage where he wants to go ahead and proceed with the RIGHT total knee.   ROS CONSTITUTIONAL: no Fever, no Chills, no Night Sweats, no Weight Loss  CARDIOVASCULAR: no Cough, no Shortness of Breath, no COPD, no Asthma  GASTROINTESTINAL: no Vomiting, no Nausea  MUSCULOSKELETAL: JOINT PAIN, but no Swelling in Joints  NEUROLOGIC: no Numbness, no Tingling/Paresthesias, no Difficulty with Balance  Physical Exam Patient is a 67 year old male.  General Mental Status - Alert, cooperative and good historian. General Appearance - pleasant, Not in acute distress. Orientation - Oriented X3. Build & Nutrition - Well nourished and Well developed.  Head and Neck Head - normocephalic, atraumatic . Neck Global Assessment - supple, no bruit auscultated on the right, no bruit auscultated on the left.  Eye Pupil - Bilateral - PERR Motion - Bilateral - EOMI.  Chest and Lung Exam Auscultation Breath sounds - clear at anterior chest wall and clear at posterior chest wall. Adventitious sounds - No Adventitious sounds.  Cardiovascular Auscultation Rhythm - Regular rate and rhythm. Heart Sounds - S1 WNL and S2 WNL. Murmurs & Other Heart Sounds - Auscultation of the heart reveals - No Murmurs.  Abdomen Palpation/Percussion Tenderness - Abdomen is non-tender to palpation. Abdomen is soft. Auscultation Auscultation of the abdomen reveals - Bowel sounds normal.  Male Genitourinary Note: Not done, not pertinent to present illness  Musculoskeletal Examination of the right hip shows flexion to 120 rotation in 30 abduction 40 and external rotation of 40. There is no tenderness over the greater trochanter. There is no pain on provocative testing of the  hip.Evaluation of the left hip shows flexion to 120 rotation in 30 out 40 and abduction 40 without discomfort. There is no tenderness over the greater trochanter. There is no pain on provocative testing of the hip. His LEFT knee shows no swelling with range of motion 0-115 and no tenderness or instability. RIGHT knee shows no effusion. His range of motion is 5-125. He is tender medial and lateral. He has some AP laxity and slight varus valgus laxity. He has a significant antalgic gait pattern on the RIGHT. Pulses sensation and motor are intact.  Radiographs are reviewed AP and lateral both knees and a prosthesis on the LEFT is in good position with no abnormalities. On the RIGHT he has bone-on-bone arthritis medial and patellofemoral.   Assessment / Plan 1. Osteoarthritis of right knee joint M17.11: Unilateral primary osteoarthritis, right knee  Patient Instructions Surgical Plans: Right Total Knee Replacement  Disposition: Home with neighbor, Straight to Outpatient - [facility]  PCP: Dr. Velna Hatchet Cards: Dr. Fransico Him - 'plan to hold Xarelto for 3 days prior...' Topical TXA - CAD, Heart Stents Anesthesia Issues: None Patient was instructed on what medications to stop prior to surgery. - Follow up visit in 2 weeks with Dr. Wynelle Link - Begin physical therapy following surgery - Pre-operative lab work as pre Pre-Surgical Testing -  Prescriptions will be provided in hospital at time of discharge  Return to [place], MD for [facility]Op at [facility] on 02/23/2018 at 02:45 PM  Encounter signed-off by Mickel Crow, PA-C

## 2018-02-08 ENCOUNTER — Other Ambulatory Visit: Payer: Self-pay

## 2018-02-08 ENCOUNTER — Inpatient Hospital Stay (HOSPITAL_COMMUNITY)
Admission: RE | Admit: 2018-02-08 | Discharge: 2018-02-11 | DRG: 470 | Disposition: A | Discharge status: Home / Self Care | Payer: Medicare Other | Source: Ambulatory Visit | Attending: Orthopedic Surgery | Admitting: Orthopedic Surgery

## 2018-02-08 ENCOUNTER — Encounter (HOSPITAL_COMMUNITY): Payer: Self-pay | Admitting: *Deleted

## 2018-02-08 ENCOUNTER — Inpatient Hospital Stay (HOSPITAL_COMMUNITY): Payer: Medicare Other | Admitting: Anesthesiology

## 2018-02-08 ENCOUNTER — Encounter (HOSPITAL_COMMUNITY)
Admission: RE | Disposition: A | Discharge status: Home / Self Care | Payer: Self-pay | Source: Ambulatory Visit | Attending: Orthopedic Surgery

## 2018-02-08 DIAGNOSIS — Z955 Presence of coronary angioplasty implant and graft: Secondary | ICD-10-CM | POA: Diagnosis not present

## 2018-02-08 DIAGNOSIS — Z9841 Cataract extraction status, right eye: Secondary | ICD-10-CM

## 2018-02-08 DIAGNOSIS — I10 Essential (primary) hypertension: Secondary | ICD-10-CM | POA: Diagnosis present

## 2018-02-08 DIAGNOSIS — I2510 Atherosclerotic heart disease of native coronary artery without angina pectoris: Secondary | ICD-10-CM | POA: Diagnosis not present

## 2018-02-08 DIAGNOSIS — Z91041 Radiographic dye allergy status: Secondary | ICD-10-CM

## 2018-02-08 DIAGNOSIS — Z8249 Family history of ischemic heart disease and other diseases of the circulatory system: Secondary | ICD-10-CM | POA: Diagnosis not present

## 2018-02-08 DIAGNOSIS — F419 Anxiety disorder, unspecified: Secondary | ICD-10-CM | POA: Diagnosis present

## 2018-02-08 DIAGNOSIS — E785 Hyperlipidemia, unspecified: Secondary | ICD-10-CM | POA: Diagnosis present

## 2018-02-08 DIAGNOSIS — Z79899 Other long term (current) drug therapy: Secondary | ICD-10-CM | POA: Diagnosis not present

## 2018-02-08 DIAGNOSIS — G8918 Other acute postprocedural pain: Secondary | ICD-10-CM | POA: Diagnosis not present

## 2018-02-08 DIAGNOSIS — Z888 Allergy status to other drugs, medicaments and biological substances status: Secondary | ICD-10-CM

## 2018-02-08 DIAGNOSIS — Z7901 Long term (current) use of anticoagulants: Secondary | ICD-10-CM | POA: Diagnosis not present

## 2018-02-08 DIAGNOSIS — M1711 Unilateral primary osteoarthritis, right knee: Secondary | ICD-10-CM | POA: Diagnosis not present

## 2018-02-08 DIAGNOSIS — Z9842 Cataract extraction status, left eye: Secondary | ICD-10-CM | POA: Diagnosis not present

## 2018-02-08 DIAGNOSIS — M179 Osteoarthritis of knee, unspecified: Secondary | ICD-10-CM | POA: Diagnosis present

## 2018-02-08 DIAGNOSIS — Z885 Allergy status to narcotic agent status: Secondary | ICD-10-CM

## 2018-02-08 DIAGNOSIS — M1710 Unilateral primary osteoarthritis, unspecified knee: Secondary | ICD-10-CM | POA: Diagnosis present

## 2018-02-08 DIAGNOSIS — I481 Persistent atrial fibrillation: Secondary | ICD-10-CM | POA: Diagnosis not present

## 2018-02-08 DIAGNOSIS — G4733 Obstructive sleep apnea (adult) (pediatric): Secondary | ICD-10-CM | POA: Diagnosis present

## 2018-02-08 HISTORY — PX: TOTAL KNEE ARTHROPLASTY: SHX125

## 2018-02-08 LAB — TYPE AND SCREEN
ABO/RH(D): B POS
ANTIBODY SCREEN: NEGATIVE

## 2018-02-08 SURGERY — ARTHROPLASTY, KNEE, TOTAL
Anesthesia: Regional | Site: Knee | Laterality: Right

## 2018-02-08 MED ORDER — ONDANSETRON HCL 4 MG/2ML IJ SOLN: 4.0000 mg | Freq: Four times a day (QID) | INTRAMUSCULAR | Status: DC | PRN

## 2018-02-08 MED ORDER — GABAPENTIN 300 MG PO CAPS
300.0000 mg | ORAL_CAPSULE | Freq: Once | ORAL | Status: AC
  Administered 2018-02-08: 300 mg via ORAL
  Filled 2018-02-08: qty 1

## 2018-02-08 MED ORDER — ACETAMINOPHEN 10 MG/ML IV SOLN: 1000.0000 mg | Freq: Once | INTRAVENOUS | Status: DC | PRN

## 2018-02-08 MED ORDER — FENTANYL CITRATE (PF) 100 MCG/2ML IJ SOLN
INTRAMUSCULAR | Status: DC | PRN
  Administered 2018-02-08: 50 ug via INTRAVENOUS

## 2018-02-08 MED ORDER — DIPHENHYDRAMINE HCL 12.5 MG/5ML PO ELIX: 12.5000 mg | ORAL_SOLUTION | ORAL | Status: DC | PRN

## 2018-02-08 MED ORDER — MIDAZOLAM HCL 2 MG/2ML IJ SOLN
INTRAMUSCULAR | Status: AC
  Filled 2018-02-08: qty 2

## 2018-02-08 MED ORDER — ONDANSETRON HCL 4 MG/2ML IJ SOLN
INTRAMUSCULAR | Status: DC | PRN
  Administered 2018-02-08: 4 mg via INTRAVENOUS

## 2018-02-08 MED ORDER — NITROGLYCERIN 0.4 MG SL SUBL: 0.4000 mg | SUBLINGUAL_TABLET | SUBLINGUAL | Status: DC | PRN

## 2018-02-08 MED ORDER — LACTATED RINGERS IV SOLN
INTRAVENOUS | Status: DC
  Administered 2018-02-08: 08:00:00 via INTRAVENOUS

## 2018-02-08 MED ORDER — PROMETHAZINE HCL 25 MG/ML IJ SOLN: 6.2500 mg | INTRAMUSCULAR | Status: DC | PRN

## 2018-02-08 MED ORDER — CEFAZOLIN SODIUM-DEXTROSE 2-4 GM/100ML-% IV SOLN
2.0000 g | Freq: Four times a day (QID) | INTRAVENOUS | Status: AC
  Administered 2018-02-08 (×2): 2 g via INTRAVENOUS
  Filled 2018-02-08 (×2): qty 100

## 2018-02-08 MED ORDER — SPIRONOLACTONE 25 MG PO TABS
25.0000 mg | ORAL_TABLET | Freq: Every day | ORAL | Status: DC
  Administered 2018-02-09 – 2018-02-11 (×2): 25 mg via ORAL
  Filled 2018-02-08 (×3): qty 1

## 2018-02-08 MED ORDER — 0.9 % SODIUM CHLORIDE (POUR BTL) OPTIME
TOPICAL | Status: DC | PRN
  Administered 2018-02-08: 1000 mL

## 2018-02-08 MED ORDER — HYDROCODONE-ACETAMINOPHEN 7.5-325 MG PO TABS: 1.0000 | ORAL_TABLET | Freq: Once | ORAL | Status: DC | PRN

## 2018-02-08 MED ORDER — HYDROMORPHONE HCL 1 MG/ML IJ SOLN: 0.5000 mg | INTRAMUSCULAR | Status: DC | PRN

## 2018-02-08 MED ORDER — DEXAMETHASONE SODIUM PHOSPHATE 10 MG/ML IJ SOLN
10.0000 mg | Freq: Once | INTRAMUSCULAR | Status: AC
  Administered 2018-02-08: 10 mg via INTRAVENOUS

## 2018-02-08 MED ORDER — SODIUM CHLORIDE 0.9 % IV SOLN
INTRAVENOUS | Status: DC
  Administered 2018-02-08 – 2018-02-09 (×3): via INTRAVENOUS

## 2018-02-08 MED ORDER — METHOCARBAMOL 1000 MG/10ML IJ SOLN
500.0000 mg | Freq: Four times a day (QID) | INTRAVENOUS | Status: DC | PRN
  Filled 2018-02-08: qty 5

## 2018-02-08 MED ORDER — FENTANYL CITRATE (PF) 100 MCG/2ML IJ SOLN
INTRAMUSCULAR | Status: AC
  Filled 2018-02-08: qty 2

## 2018-02-08 MED ORDER — PROPOFOL 10 MG/ML IV BOLUS
INTRAVENOUS | Status: AC
  Filled 2018-02-08: qty 40

## 2018-02-08 MED ORDER — CHLORHEXIDINE GLUCONATE 4 % EX LIQD: 60.0000 mL | Freq: Once | CUTANEOUS | Status: DC

## 2018-02-08 MED ORDER — PROPOFOL 10 MG/ML IV BOLUS
INTRAVENOUS | Status: DC | PRN
  Administered 2018-02-08 (×2): 20 mg via INTRAVENOUS

## 2018-02-08 MED ORDER — BISACODYL 10 MG RE SUPP: 10.0000 mg | Freq: Every day | RECTAL | Status: DC | PRN

## 2018-02-08 MED ORDER — RIVAROXABAN 10 MG PO TABS
10.0000 mg | ORAL_TABLET | Freq: Every day | ORAL | Status: DC
  Administered 2018-02-09 – 2018-02-11 (×3): 10 mg via ORAL
  Filled 2018-02-08 (×3): qty 1

## 2018-02-08 MED ORDER — PHENOL 1.4 % MT LIQD: 1.0000 | OROMUCOSAL | Status: DC | PRN

## 2018-02-08 MED ORDER — TRANEXAMIC ACID 1000 MG/10ML IV SOLN
2000.0000 mg | Freq: Once | INTRAVENOUS | Status: DC
  Filled 2018-02-08: qty 20

## 2018-02-08 MED ORDER — PROPOFOL 10 MG/ML IV BOLUS
INTRAVENOUS | Status: AC
  Filled 2018-02-08: qty 20

## 2018-02-08 MED ORDER — STERILE WATER FOR IRRIGATION IR SOLN
Status: DC | PRN
  Administered 2018-02-08: 2000 mL

## 2018-02-08 MED ORDER — ATORVASTATIN CALCIUM 40 MG PO TABS
80.0000 mg | ORAL_TABLET | Freq: Every day | ORAL | Status: DC
  Administered 2018-02-10: 80 mg via ORAL
  Filled 2018-02-08 (×2): qty 2

## 2018-02-08 MED ORDER — DOCUSATE SODIUM 100 MG PO CAPS
100.0000 mg | ORAL_CAPSULE | Freq: Two times a day (BID) | ORAL | Status: DC
  Administered 2018-02-08 – 2018-02-11 (×6): 100 mg via ORAL
  Filled 2018-02-08 (×6): qty 1

## 2018-02-08 MED ORDER — ROPIVACAINE HCL 7.5 MG/ML IJ SOLN
INTRAMUSCULAR | Status: DC | PRN
  Administered 2018-02-08: 20 mL via PERINEURAL

## 2018-02-08 MED ORDER — SODIUM CHLORIDE 0.9 % IJ SOLN
INTRAMUSCULAR | Status: DC | PRN
  Administered 2018-02-08: 60 mL

## 2018-02-08 MED ORDER — METHOCARBAMOL 500 MG PO TABS
500.0000 mg | ORAL_TABLET | Freq: Four times a day (QID) | ORAL | Status: DC | PRN
  Administered 2018-02-08 – 2018-02-11 (×7): 500 mg via ORAL
  Filled 2018-02-08 (×7): qty 1

## 2018-02-08 MED ORDER — GLYCOPYRROLATE 0.2 MG/ML IV SOSY
PREFILLED_SYRINGE | INTRAVENOUS | Status: AC
  Filled 2018-02-08: qty 5

## 2018-02-08 MED ORDER — BUPIVACAINE IN DEXTROSE 0.75-8.25 % IT SOLN
INTRATHECAL | Status: DC | PRN
  Administered 2018-02-08: 2 mL via INTRATHECAL

## 2018-02-08 MED ORDER — METOCLOPRAMIDE HCL 5 MG/ML IJ SOLN: 5.0000 mg | Freq: Three times a day (TID) | INTRAMUSCULAR | Status: DC | PRN

## 2018-02-08 MED ORDER — POLYETHYLENE GLYCOL 3350 17 G PO PACK
17.0000 g | PACK | Freq: Every day | ORAL | Status: DC | PRN
  Filled 2018-02-08 (×2): qty 1

## 2018-02-08 MED ORDER — LOSARTAN POTASSIUM 50 MG PO TABS
100.0000 mg | ORAL_TABLET | Freq: Every day | ORAL | Status: DC
  Administered 2018-02-09 – 2018-02-11 (×3): 100 mg via ORAL
  Filled 2018-02-08 (×3): qty 2

## 2018-02-08 MED ORDER — SODIUM CHLORIDE 0.9 % IR SOLN
Status: DC | PRN
  Administered 2018-02-08: 1000 mL

## 2018-02-08 MED ORDER — SODIUM CHLORIDE 0.9 % IJ SOLN
INTRAMUSCULAR | Status: AC
  Filled 2018-02-08: qty 10

## 2018-02-08 MED ORDER — TRAMADOL HCL 50 MG PO TABS
50.0000 mg | ORAL_TABLET | Freq: Four times a day (QID) | ORAL | Status: DC | PRN
  Administered 2018-02-09: 100 mg via ORAL
  Administered 2018-02-09: 50 mg via ORAL
  Administered 2018-02-10 – 2018-02-11 (×2): 100 mg via ORAL
  Filled 2018-02-08: qty 1
  Filled 2018-02-08 (×3): qty 2

## 2018-02-08 MED ORDER — MIDAZOLAM HCL 2 MG/2ML IJ SOLN
1.0000 mg | INTRAMUSCULAR | Status: DC
  Administered 2018-02-08: 2 mg via INTRAVENOUS
  Filled 2018-02-08: qty 2

## 2018-02-08 MED ORDER — METOPROLOL TARTRATE 50 MG PO TABS
100.0000 mg | ORAL_TABLET | Freq: Two times a day (BID) | ORAL | Status: DC
  Administered 2018-02-08 – 2018-02-11 (×5): 100 mg via ORAL
  Filled 2018-02-08 (×6): qty 2

## 2018-02-08 MED ORDER — ACETAMINOPHEN 10 MG/ML IV SOLN
1000.0000 mg | Freq: Once | INTRAVENOUS | Status: AC
  Administered 2018-02-08: 1000 mg via INTRAVENOUS
  Filled 2018-02-08: qty 100

## 2018-02-08 MED ORDER — OXYCODONE HCL 5 MG PO TABS
5.0000 mg | ORAL_TABLET | ORAL | Status: DC | PRN
  Administered 2018-02-08: 10 mg via ORAL
  Administered 2018-02-08: 5 mg via ORAL
  Administered 2018-02-09 (×2): 10 mg via ORAL
  Filled 2018-02-08 (×3): qty 2
  Filled 2018-02-08: qty 1
  Filled 2018-02-08 (×3): qty 2

## 2018-02-08 MED ORDER — MEPERIDINE HCL 50 MG/ML IJ SOLN: 6.2500 mg | INTRAMUSCULAR | Status: DC | PRN

## 2018-02-08 MED ORDER — METOCLOPRAMIDE HCL 5 MG PO TABS: 5.0000 mg | ORAL_TABLET | Freq: Three times a day (TID) | ORAL | Status: DC | PRN

## 2018-02-08 MED ORDER — GABAPENTIN 300 MG PO CAPS
300.0000 mg | ORAL_CAPSULE | Freq: Three times a day (TID) | ORAL | Status: DC
  Administered 2018-02-08 – 2018-02-11 (×9): 300 mg via ORAL
  Filled 2018-02-08 (×9): qty 1

## 2018-02-08 MED ORDER — PROPOFOL 500 MG/50ML IV EMUL
INTRAVENOUS | Status: DC | PRN
  Administered 2018-02-08: 75 ug/kg/min via INTRAVENOUS

## 2018-02-08 MED ORDER — ONDANSETRON HCL 4 MG PO TABS: 4.0000 mg | ORAL_TABLET | Freq: Four times a day (QID) | ORAL | Status: DC | PRN

## 2018-02-08 MED ORDER — BUPIVACAINE LIPOSOME 1.3 % IJ SUSP
INTRAMUSCULAR | Status: DC | PRN
  Administered 2018-02-08: 20 mL

## 2018-02-08 MED ORDER — OXYCODONE HCL 5 MG PO TABS
10.0000 mg | ORAL_TABLET | ORAL | Status: DC | PRN
  Administered 2018-02-09 (×3): 10 mg via ORAL
  Administered 2018-02-10: 15 mg via ORAL
  Administered 2018-02-10: 10 mg via ORAL
  Filled 2018-02-08: qty 2
  Filled 2018-02-08: qty 3

## 2018-02-08 MED ORDER — SODIUM CHLORIDE 0.9 % IJ SOLN
INTRAMUSCULAR | Status: AC
  Filled 2018-02-08: qty 50

## 2018-02-08 MED ORDER — PHENYLEPHRINE HCL 10 MG/ML IJ SOLN
INTRAVENOUS | Status: DC | PRN
  Administered 2018-02-08: 40 ug/min via INTRAVENOUS

## 2018-02-08 MED ORDER — FENTANYL CITRATE (PF) 100 MCG/2ML IJ SOLN
50.0000 ug | INTRAMUSCULAR | Status: DC
  Administered 2018-02-08: 50 ug via INTRAVENOUS
  Filled 2018-02-08: qty 2

## 2018-02-08 MED ORDER — DEXAMETHASONE SODIUM PHOSPHATE 10 MG/ML IJ SOLN
INTRAMUSCULAR | Status: AC
  Filled 2018-02-08: qty 1

## 2018-02-08 MED ORDER — ONDANSETRON HCL 4 MG/2ML IJ SOLN
INTRAMUSCULAR | Status: AC
  Filled 2018-02-08: qty 2

## 2018-02-08 MED ORDER — HYDROMORPHONE HCL 1 MG/ML IJ SOLN: 0.2500 mg | INTRAMUSCULAR | Status: DC | PRN

## 2018-02-08 MED ORDER — SODIUM CHLORIDE 0.9 % IV SOLN
INTRAVENOUS | Status: AC | PRN
  Administered 2018-02-08: 2000 mg via TOPICAL

## 2018-02-08 MED ORDER — MENTHOL 3 MG MT LOZG: 1.0000 | LOZENGE | OROMUCOSAL | Status: DC | PRN

## 2018-02-08 MED ORDER — ACETAMINOPHEN 500 MG PO TABS
1000.0000 mg | ORAL_TABLET | Freq: Four times a day (QID) | ORAL | Status: AC
  Administered 2018-02-08 – 2018-02-09 (×4): 1000 mg via ORAL
  Filled 2018-02-08 (×4): qty 2

## 2018-02-08 MED ORDER — EZETIMIBE 10 MG PO TABS
10.0000 mg | ORAL_TABLET | Freq: Every day | ORAL | Status: DC
  Administered 2018-02-10: 10 mg via ORAL
  Filled 2018-02-08 (×3): qty 1

## 2018-02-08 MED ORDER — LORAZEPAM 0.5 MG PO TABS: 0.5000 mg | ORAL_TABLET | Freq: Every day | ORAL | Status: DC | PRN

## 2018-02-08 MED ORDER — FLEET ENEMA 7-19 GM/118ML RE ENEM: 1.0000 | ENEMA | Freq: Once | RECTAL | Status: DC | PRN

## 2018-02-08 MED ORDER — CEFAZOLIN SODIUM-DEXTROSE 2-4 GM/100ML-% IV SOLN
2.0000 g | INTRAVENOUS | Status: AC
  Administered 2018-02-08: 2 g via INTRAVENOUS
  Filled 2018-02-08: qty 100

## 2018-02-08 MED ORDER — DEXAMETHASONE SODIUM PHOSPHATE 10 MG/ML IJ SOLN
10.0000 mg | Freq: Once | INTRAMUSCULAR | Status: AC
  Administered 2018-02-09: 10 mg via INTRAVENOUS
  Filled 2018-02-08: qty 1

## 2018-02-08 SURGICAL SUPPLY — 52 items
BAG DECANTER FOR FLEXI CONT (MISCELLANEOUS) ×2 IMPLANT
BAG ZIPLOCK 12X15 (MISCELLANEOUS) ×2 IMPLANT
BANDAGE ACE 6X5 VEL STRL LF (GAUZE/BANDAGES/DRESSINGS) ×2 IMPLANT
BLADE SAG 18X100X1.27 (BLADE) ×2 IMPLANT
BLADE SAW SGTL 11.0X1.19X90.0M (BLADE) ×2 IMPLANT
BOWL SMART MIX CTS (DISPOSABLE) ×2 IMPLANT
CAPT KNEE TOTAL 3 ATTUNE ×2 IMPLANT
CATH FOLEY 2WAY SLVR  5CC 14FR (CATHETERS) ×1
CATH FOLEY 2WAY SLVR 5CC 14FR (CATHETERS) ×1 IMPLANT
CEMENT HV SMART SET (Cement) ×4 IMPLANT
COVER SURGICAL LIGHT HANDLE (MISCELLANEOUS) ×2 IMPLANT
CUFF TOURN SGL QUICK 34 (TOURNIQUET CUFF) ×1
CUFF TRNQT CYL 34X4X40X1 (TOURNIQUET CUFF) ×1 IMPLANT
DECANTER SPIKE VIAL GLASS SM (MISCELLANEOUS) ×2 IMPLANT
DRAPE TOP 10253 STERILE (DRAPES) IMPLANT
DRAPE U-SHAPE 47X51 STRL (DRAPES) ×2 IMPLANT
DRSG ADAPTIC 3X8 NADH LF (GAUZE/BANDAGES/DRESSINGS) ×2 IMPLANT
DRSG PAD ABDOMINAL 8X10 ST (GAUZE/BANDAGES/DRESSINGS) ×2 IMPLANT
DURAPREP 26ML APPLICATOR (WOUND CARE) ×2 IMPLANT
ELECT REM PT RETURN 15FT ADLT (MISCELLANEOUS) ×2 IMPLANT
EVACUATOR 1/8 PVC DRAIN (DRAIN) ×2 IMPLANT
GAUZE SPONGE 4X4 12PLY STRL (GAUZE/BANDAGES/DRESSINGS) ×2 IMPLANT
GLOVE BIO SURGEON STRL SZ7.5 (GLOVE) ×2 IMPLANT
GLOVE BIO SURGEON STRL SZ8 (GLOVE) ×2 IMPLANT
GLOVE BIOGEL PI IND STRL 6.5 (GLOVE) IMPLANT
GLOVE BIOGEL PI IND STRL 8 (GLOVE) ×2 IMPLANT
GLOVE BIOGEL PI INDICATOR 6.5 (GLOVE)
GLOVE BIOGEL PI INDICATOR 8 (GLOVE) ×2
GLOVE SURG SS PI 6.5 STRL IVOR (GLOVE) IMPLANT
GOWN STRL REUS W/TWL LRG LVL3 (GOWN DISPOSABLE) ×2 IMPLANT
GOWN STRL REUS W/TWL XL LVL3 (GOWN DISPOSABLE) ×2 IMPLANT
HANDPIECE INTERPULSE COAX TIP (DISPOSABLE) ×1
IMMOBILIZER KNEE 20 (SOFTGOODS) ×2
IMMOBILIZER KNEE 20 THIGH 36 (SOFTGOODS) ×1 IMPLANT
MANIFOLD NEPTUNE II (INSTRUMENTS) ×2 IMPLANT
NS IRRIG 1000ML POUR BTL (IV SOLUTION) ×2 IMPLANT
PACK TOTAL KNEE CUSTOM (KITS) ×2 IMPLANT
PADDING CAST COTTON 6X4 STRL (CAST SUPPLIES) ×4 IMPLANT
POSITIONER SURGICAL ARM (MISCELLANEOUS) ×2 IMPLANT
SET HNDPC FAN SPRY TIP SCT (DISPOSABLE) ×1 IMPLANT
STRIP CLOSURE SKIN 1/2X4 (GAUZE/BANDAGES/DRESSINGS) ×4 IMPLANT
SUT MNCRL AB 4-0 PS2 18 (SUTURE) ×2 IMPLANT
SUT STRATAFIX 0 PDS 27 VIOLET (SUTURE) ×2
SUT VIC AB 2-0 CT1 27 (SUTURE) ×3
SUT VIC AB 2-0 CT1 TAPERPNT 27 (SUTURE) ×3 IMPLANT
SUTURE STRATFX 0 PDS 27 VIOLET (SUTURE) ×1 IMPLANT
SYR 30ML LL (SYRINGE) IMPLANT
TRAY FOLEY CATH 14FRSI W/METER (CATHETERS) ×2 IMPLANT
TRAY FOLEY W/METER SILVER 16FR (SET/KITS/TRAYS/PACK) ×2 IMPLANT
WATER STERILE IRR 1000ML POUR (IV SOLUTION) ×4 IMPLANT
WRAP KNEE MAXI GEL POST OP (GAUZE/BANDAGES/DRESSINGS) ×2 IMPLANT
YANKAUER SUCT BULB TIP 10FT TU (MISCELLANEOUS) ×2 IMPLANT

## 2018-02-08 NOTE — Anesthesia Procedure Notes (Addendum)
Anesthesia Regional Block: Adductor canal block   Pre-Anesthetic Checklist: ,, timeout performed, Correct Patient, Correct Site, Correct Laterality, Correct Procedure, Correct Position, site marked, Risks and benefits discussed,  Surgical consent,  Pre-op evaluation,  At surgeon's request and post-op pain management  Laterality: Right  Prep: chloraprep       Needles:  Injection technique: Single-shot  Needle Type: Echogenic Needle     Needle Length: 9cm  Needle Gauge: 21     Additional Needles:   Procedures:,,,, ultrasound used (permanent image in chart),,,,  Narrative:  Start time: 02/08/2018 8:44 AM End time: 02/08/2018 8:49 AM Injection made incrementally with aspirations every 5 mL.  Performed by: Personally  Anesthesiologist: Barnet Glasgow, MD

## 2018-02-08 NOTE — Interval H&P Note (Signed)
History and Physical Interval Note:  02/08/2018 8:11 AM  Troy [name]  has presented today for surgery, with the diagnosis of Osteoarthritis Right Knee  The various methods of treatment have been discussed with the patient and family. After consideration of risks, benefits and other options for treatment, the patient has consented to  Procedure(s): RIGHT TOTAL KNEE ARTHROPLASTY (Right) as a surgical intervention .  The patient's history has been reviewed, patient examined, no change in status, stable for surgery.  I have reviewed the patient's chart and labs.  Questions were answered to the patient's satisfaction.     Pilar Plate Troy [name]

## 2018-02-08 NOTE — Transfer of Care (Signed)
Immediate Anesthesia Transfer of Care Note  Patient: Troy [name]  Procedure(s) Performed: Procedure(s) with comments: RIGHT TOTAL KNEE ARTHROPLASTY (Right) - with block  Patient Location: PACU  Anesthesia Type:Regional  Level of Consciousness:  sedated, patient cooperative and responds to stimulation  Airway & Oxygen Therapy:Patient Spontanous Breathing and Patient connected to face mask oxgen  Post-op Assessment:  Report given to PACU RN and Post -op Vital signs reviewed and stable  Post vital signs:  Reviewed and stable  Last Vitals:  Vitals:   02/08/18 0905 02/08/18 0915  BP: 130/81 133/86  Pulse: (!) 46 (!) 52  Resp: 10 18  Temp:    SpO2: 05% 18%    Complications: No apparent anesthesia complications

## 2018-02-08 NOTE — Progress Notes (Signed)
AssistedDr. Valma Cava with right, ultrasound guided, knee, adductor canal block. Side rails up, monitors on throughout procedure. See vital signs in flow sheet. Tolerated Procedure well.

## 2018-02-08 NOTE — Anesthesia Postprocedure Evaluation (Signed)
Anesthesia Post Note  Patient: Troy [name]  Procedure(s) Performed: RIGHT TOTAL KNEE ARTHROPLASTY (Right Knee)     Patient location during evaluation: PACU Anesthesia Type: Regional and Spinal Level of consciousness: oriented and awake and alert Pain management: pain level controlled Vital Signs Assessment: post-procedure vital signs reviewed and stable Respiratory status: spontaneous breathing, respiratory function stable and patient connected to nasal cannula oxygen Cardiovascular status: blood pressure returned to baseline and stable Postop Assessment: no headache, no backache and no apparent nausea or vomiting Anesthetic complications: no    Last Vitals:  Vitals:   02/08/18 0915 02/08/18 1202  BP: 133/86 125/89  Pulse: (!) 52 65  Resp: 18 15  Temp:  (!) 36.4 C  SpO2: 97% 100%    Last Pain:  Vitals:   02/08/18 1202  TempSrc:   PainSc: (P) 0-No pain    LLE Motor Response: (P) No movement due to regional block (02/08/18 1202) LLE Sensation: (P) Numbness (02/08/18 1202) RLE Motor Response: (P) No movement due to regional block (02/08/18 1202) RLE Sensation: (P) Numbness (02/08/18 1202) L Sensory Level: (P) T10-Umbilical region (01/41/03 1202) R Sensory Level: (P) T10-Umbilical region (12/10/41 1202)  Barnet Glasgow

## 2018-02-08 NOTE — Op Note (Signed)
OPERATIVE REPORT-TOTAL KNEE ARTHROPLASTY   Pre-operative diagnosis- Osteoarthritis  Right knee(s)  Post-operative diagnosis- Osteoarthritis Right knee(s)  Procedure-  Right  Total Knee Arthroplasty (Depuy Attune)  Surgeon- Dione Plover. Brixton Franko, MD  Assistant- Arlee Muslim, PA-C   Anesthesia-  Adductor canal block and spinal  EBL- 25 ml   Drains Hemovac  Tourniquet time-  Total Tourniquet Time Documented: Thigh (Right) - 36 minutes Total: Thigh (Right) - 36 minutes     Complications- None  Condition-PACU - hemodynamically stable.   Brief Clinical Note  Troy [name] is a 67 y.o. year old male with end stage OA of his right knee with progressively worsening pain and dysfunction. He has constant pain, with activity and at rest and significant functional deficits with difficulties even with ADLs. He has had extensive non-op management including analgesics, injections of cortisone and viscosupplements, and home exercise program, but remains in significant pain with significant dysfunction. Radiographs show bone on bone arthritis medial and patellofemoral. He presents now for right Total Knee Arthroplasty.    Procedure in detail---   The patient is brought into the operating room and positioned supine on the operating table. After successful administration of  Adductor canal block and spinal,   a tourniquet is placed high on the  Right thigh(s) and the lower extremity is prepped and draped in the usual sterile fashion. Time out is performed by the operating team and then the  Right lower extremity is wrapped in Esmarch, knee flexed and the tourniquet inflated to 300 mmHg.       A midline incision is made with a ten blade through the subcutaneous tissue to the level of the extensor mechanism. A fresh blade is used to make a medial parapatellar arthrotomy. Soft tissue over the proximal medial tibia is subperiosteally elevated to the joint line with a knife and into the semimembranosus  bursa with a Cobb elevator. Soft tissue over the proximal lateral tibia is elevated with attention being paid to avoiding the patellar tendon on the tibial tubercle. The patella is everted, knee flexed 90 degrees and the ACL and PCL are removed. Findings are bone on bone medial and patellofemoral with large global osteophytes.        The drill is used to create a starting hole in the distal femur and the canal is thoroughly irrigated with sterile saline to remove the fatty contents. The 5 degree Right  valgus alignment guide is placed into the femoral canal and the distal femoral cutting block is pinned to remove 9 mm off the distal femur. Resection is made with an oscillating saw.      The tibia is subluxed forward and the menisci are removed. The extramedullary alignment guide is placed referencing proximally at the medial aspect of the tibial tubercle and distally along the second metatarsal axis and tibial crest. The block is pinned to remove 59mm off the more deficient medial  side. Resection is made with an oscillating saw. Size 7is the most appropriate size for the tibia and the proximal tibia is prepared with the modular drill and keel punch for that size.      The femoral sizing guide is placed and size 7 is most appropriate. Rotation is marked off the epicondylar axis and confirmed by creating a rectangular flexion gap at 90 degrees. The size 7 cutting block is pinned in this rotation and the anterior, posterior and chamfer cuts are made with the oscillating saw. The intercondylar block is then placed and that cut is  made.      Trial size 7 tibial component, trial size 7 posterior stabilized femur and a 6  mm posterior stabilized rotating platform insert trial is placed. Full extension is achieved with excellent varus/valgus and anterior/posterior balance throughout full range of motion. The patella is everted and thickness measured to be 27  mm. Free hand resection is taken to 15 mm, a 41 template is  placed, lug holes are drilled, trial patella is placed, and it tracks normally. Osteophytes are removed off the posterior femur with the trial in place. All trials are removed and the cut bone surfaces prepared with pulsatile lavage. Cement is mixed and once ready for implantation, the size 7 tibial implant, size  7 posterior stabilized femoral component, and the size 41 patella are cemented in place and the patella is held with the clamp. The trial insert is placed and the knee held in full extension. The Exparel (20 ml mixed with 60 ml saline) is injected into the extensor mechanism, posterior capsule, medial and lateral gutters and subcutaneous tissues.  All extruded cement is removed and once the cement is hard the permanent 6 mm posterior stabilized rotating platform insert is placed into the tibial tray.      The wound is copiously irrigated with saline solution and the extensor mechanism closed over a hemovac drain with #1 V-loc suture. The tourniquet is released for a total tourniquet time of 36  minutes. Flexion against gravity is 140 degrees and the patella tracks normally. Subcutaneous tissue is closed with 2.0 vicryl and subcuticular with running 4.0 Monocryl. The incision is cleaned and dried and steri-strips and a bulky sterile dressing are applied. The limb is placed into a knee immobilizer and the patient is awakened and transported to recovery in stable condition.      Please note that a surgical assistant was a medical necessity for this procedure in order to perform it in a safe and expeditious manner. Surgical assistant was necessary to retract the ligaments and vital neurovascular structures to prevent injury to them and also necessary for proper positioning of the limb to allow for anatomic placement of the prosthesis.   Dione Plover [name], MD    02/08/2018, 11:33 AM

## 2018-02-08 NOTE — Anesthesia Procedure Notes (Signed)
Spinal  Patient location during procedure: OB Start time: 02/08/2018 10:20 AM End time: 02/08/2018 10:27 AM Staffing Anesthesiologist: Barnet Glasgow, MD Performed: anesthesiologist  Preanesthetic Checklist Completed: patient identified, surgical consent, pre-op evaluation, timeout performed, IV checked, risks and benefits discussed and monitors and equipment checked Spinal Block Patient position: sitting Prep: site prepped and draped and DuraPrep Patient monitoring: heart rate, cardiac monitor, continuous pulse ox and blood pressure Approach: midline Location: L2-3 Injection technique: single-shot Needle Needle type: Pencan  Needle gauge: 24 G Needle length: 10 cm Assessment Sensory level: T4

## 2018-02-09 ENCOUNTER — Encounter (HOSPITAL_COMMUNITY): Payer: Self-pay | Admitting: Orthopedic Surgery

## 2018-02-09 LAB — BASIC METABOLIC PANEL
Anion gap: 6 (ref 5–15)
BUN: 18 mg/dL (ref 6–20)
CHLORIDE: 107 mmol/L (ref 101–111)
CO2: 26 mmol/L (ref 22–32)
CREATININE: 0.85 mg/dL (ref 0.61–1.24)
Calcium: 8.7 mg/dL — ABNORMAL LOW (ref 8.9–10.3)
GFR calc non Af Amer: >60 mL/min (ref >60)
Glucose, Bld: 129 mg/dL — ABNORMAL HIGH (ref 65–99)
POTASSIUM: 4.5 mmol/L (ref 3.5–5.1)
Sodium: 139 mmol/L (ref 135–145)

## 2018-02-09 LAB — CBC
HEMATOCRIT: 39.3 % (ref 39.0–52.0)
HEMOGLOBIN: 13 g/dL (ref 13.0–17.0)
MCH: 29.2 pg (ref 26.0–34.0)
MCHC: 33.1 g/dL (ref 30.0–36.0)
MCV: 88.3 fL (ref 78.0–100.0)
Platelets: 161 10*3/uL (ref 150–400)
RBC: 4.45 MIL/uL (ref 4.22–5.81)
RDW: 13.4 % (ref 11.5–15.5)
WBC: 10.4 10*3/uL (ref 4.0–10.5)

## 2018-02-09 MED ORDER — OXYMETAZOLINE HCL 0.05 % NA SOLN: 1.0000 | Freq: Two times a day (BID) | NASAL | 0 refills | Status: DC | PRN

## 2018-02-09 MED ORDER — GABAPENTIN 300 MG PO CAPS
300.0000 mg | ORAL_CAPSULE | Freq: Three times a day (TID) | ORAL | 0 refills | Status: DC
Start: 2018-02-09 — End: 2018-02-10

## 2018-02-09 MED ORDER — OXYCODONE HCL 5 MG PO TABS: 5.0000 mg | ORAL_TABLET | ORAL | 0 refills | Status: DC | PRN

## 2018-02-09 MED ORDER — TRAMADOL HCL 50 MG PO TABS: 50.0000 mg | ORAL_TABLET | Freq: Four times a day (QID) | ORAL | 0 refills | Status: DC | PRN

## 2018-02-09 MED ORDER — METHOCARBAMOL 500 MG PO TABS: 500.0000 mg | ORAL_TABLET | Freq: Four times a day (QID) | ORAL | 0 refills | Status: DC | PRN

## 2018-02-09 NOTE — Discharge Summary (Signed)
Physician Discharge Summary   Patient ID: Troy [name] MRN: [id number] DOB/AGE: 1950/11/13 67 y.o.  Admit date: 02/08/2018 Discharge date: 02/11/2018  Primary Diagnosis:  Osteoarthritis Right knee(s)   Admission Diagnoses:  Past Medical History:  Diagnosis Date  . Arthritis    "knees" (01/02/2017)  . CAD (coronary artery disease)    a. s/p PCI in 2010 in [place]. b. Unstable angina/LHC 01/11/75 complicated by V fib arrest after contrast injection. Cath 06/19/2015 s/p DES to mid LAD and RPDA. c. 11/2016: chest pain/ abnormal nuclear stress test prompting cath 11/13/16 showing 80% ostial diag, 60% mid RCA, no acute lesions, medical therapy recommended.  . Dyslipidemia   . Essential hypertension   . Lung nodule < 6cm on CT 06/16/15   Right lung  . Mitral regurgitation    a. Mild-mod by echo 06/2015. b. not seen other than trivial in 2018.  . OSA on CPAP   . Persistent atrial fibrillation ([facility])    a. 06/2015 noted to be in new a-fib when arrived with unstable angina. Converted to NSR after being shocked in the cath lab for vfib;  b. 06/2015 Eliquis initiated as PAF noted on event monitor. c. Recurrent atrial fib?flutter 10/2016, issues with recurrent AF requiring multiple DCCVs in 02/2017. Failed sotalol, not candidate for Tikosyn due to cost/QTC, Multaq not felt likely strong enough.  . Pulmonary nodule 07/2015   a. 1.5 x 1.2 cm smoothly marginated nodule in the central aspect of the right lower lobe with recommendation correlation with nonemergent PET-CT to exclude a neoplasm , stable by CT 07/2016  . Severe left ventricular hypertrophy   . Ventricular fibrillation ([place])    a. occured during cath 06/18/2015.   Discharge Diagnoses:   Principal Problem:   OA (osteoarthritis) of knee  Estimated body mass index is 31.8 kg/m as calculated from the following:   Height as of this encounter: 5' 11" (1.803 m).   Weight as of this encounter: 103.4 kg (228 lb).  Procedure:  Procedure(s)  (LRB): RIGHT TOTAL KNEE ARTHROPLASTY (Right)   Consults: None  HPI: Troy [name] is a 67 y.o. year old male with end stage OA of his right knee with progressively worsening pain and dysfunction. He has constant pain, with activity and at rest and significant functional deficits with difficulties even with ADLs. He has had extensive non-op management including analgesics, injections of cortisone and viscosupplements, and home exercise program, but remains in significant pain with significant dysfunction. Radiographs show bone on bone arthritis medial and patellofemoral. He presents now for right Total Knee Arthroplasty.     Laboratory Data: Admission on 02/08/2018  Component Date Value Ref Range Status  . WBC 02/09/2018 10.4  4.0 - 10.5 K/uL Final  . RBC 02/09/2018 4.45  4.22 - 5.81 MIL/uL Final  . Hemoglobin 02/09/2018 13.0  13.0 - 17.0 g/dL Final  . HCT 02/09/2018 39.3  39.0 - 52.0 % Final  . MCV 02/09/2018 88.3  78.0 - 100.0 fL Final  . MCH 02/09/2018 29.2  26.0 - 34.0 pg Final  . MCHC 02/09/2018 33.1  30.0 - 36.0 g/dL Final  . RDW 02/09/2018 13.4  11.5 - 15.5 % Final  . Platelets 02/09/2018 161  150 - 400 K/uL Final   Performed at [facility], [place] [address], [place], [place] [address]  . Sodium 02/09/2018 139  135 - 145 mmol/L Final  . Potassium 02/09/2018 4.5  3.5 - 5.1 mmol/L Final  . Chloride 02/09/2018 107  101 - 111  mmol/L Final  . CO2 02/09/2018 26  22 - 32 mmol/L Final  . Glucose, Bld 02/09/2018 129* 65 - 99 mg/dL Final  . BUN 02/09/2018 18  6 - 20 mg/dL Final  . Creatinine, Ser 02/09/2018 0.85  0.61 - 1.24 mg/dL Final  . Calcium 02/09/2018 8.7* 8.9 - 10.3 mg/dL Final  . GFR calc non Af Amer 02/09/2018 >60  >60 mL/min Final  . GFR calc Af Amer 02/09/2018 >60  >60 mL/min Final   Comment: (NOTE) The eGFR has been calculated using the CKD EPI equation. This calculation has not been validated in all clinical situations. eGFR's persistently <60 mL/min  signify possible Chronic Kidney Disease.   Georgiann Hahn gap 02/09/2018 6  5 - 15 Final   Performed at [facility], [place] [address] [place], [place] [address]  . WBC 02/10/2018 11.5* 4.0 - 10.5 K/uL Final  . RBC 02/10/2018 4.62  4.22 - 5.81 MIL/uL Final  . Hemoglobin 02/10/2018 13.5  13.0 - 17.0 g/dL Final  . HCT 02/10/2018 41.4  39.0 - 52.0 % Final  . MCV 02/10/2018 89.6  78.0 - 100.0 fL Final  . MCH 02/10/2018 29.2  26.0 - 34.0 pg Final  . MCHC 02/10/2018 32.6  30.0 - 36.0 g/dL Final  . RDW 02/10/2018 13.8  11.5 - 15.5 % Final  . Platelets 02/10/2018 182  150 - 400 K/uL Final   Performed at [facility], [place] [address] [place], [place] [address]  . Sodium 02/10/2018 139  135 - 145 mmol/L Final  . Potassium 02/10/2018 4.2  3.5 - 5.1 mmol/L Final  . Chloride 02/10/2018 102  101 - 111 mmol/L Final  . CO2 02/10/2018 29  22 - 32 mmol/L Final  . Glucose, Bld 02/10/2018 113* 65 - 99 mg/dL Final  . BUN 02/10/2018 16  6 - 20 mg/dL Final  . Creatinine, Ser 02/10/2018 0.85  0.61 - 1.24 mg/dL Final  . Calcium 02/10/2018 9.2  8.9 - 10.3 mg/dL Final  . GFR calc non Af Amer 02/10/2018 >60  >60 mL/min Final  . GFR calc Af Amer 02/10/2018 >60  >60 mL/min Final   Comment: (NOTE) The eGFR has been calculated using the CKD EPI equation. This calculation has not been validated in all clinical situations. eGFR's persistently <60 mL/min signify possible Chronic Kidney Disease.   Georgiann Hahn gap 02/10/2018 8  5 - 15 Final   Performed at [facility], [place] [address] [place], [place] [address]  . WBC 02/11/2018 9.4  4.0 - 10.5 K/uL Final  . RBC 02/11/2018 4.13* 4.22 - 5.81 MIL/uL Final  . Hemoglobin 02/11/2018 12.3* 13.0 - 17.0 g/dL Final  . HCT 02/11/2018 37.2* 39.0 - 52.0 % Final  . MCV 02/11/2018 90.1  78.0 - 100.0 fL Final  . MCH 02/11/2018 29.8  26.0 - 34.0 pg Final  . MCHC 02/11/2018 33.1  30.0 - 36.0 g/dL Final  . RDW 02/11/2018 13.9  11.5 -  15.5 % Final  . Platelets 02/11/2018 148* 150 - 400 K/uL Final   Performed at [facility], [place] [address] [place], [place] [address]  Hospital Outpatient Visit on 02/02/2018  Component Date Value Ref Range Status  . aPTT 02/02/2018 31  24 - 36 seconds Final   Performed at [facility], [place] [address], [place], [place] [address]  . WBC 02/02/2018 5.8  4.0 - 10.5 K/uL Final  . RBC 02/02/2018 4.80  4.22 - 5.81  MIL/uL Final  . Hemoglobin 02/02/2018 14.1  13.0 - 17.0 g/dL Final  . HCT 02/02/2018 43.7  39.0 - 52.0 % Final  . MCV 02/02/2018 91.0  78.0 - 100.0 fL Final  . MCH 02/02/2018 29.4  26.0 - 34.0 pg Final  . MCHC 02/02/2018 32.3  30.0 - 36.0 g/dL Final  . RDW 02/02/2018 13.9  11.5 - 15.5 % Final  . Platelets 02/02/2018 161  150 - 400 K/uL Final   Performed at [facility], [place] [address] [place], [place] [address]  . Sodium 02/02/2018 140  135 - 145 mmol/L Final  . Potassium 02/02/2018 4.5  3.5 - 5.1 mmol/L Final  . Chloride 02/02/2018 107  101 - 111 mmol/L Final  . CO2 02/02/2018 25  22 - 32 mmol/L Final  . Glucose, Bld 02/02/2018 99  65 - 99 mg/dL Final  . BUN 02/02/2018 25* 6 - 20 mg/dL Final  . Creatinine, Ser 02/02/2018 0.93  0.61 - 1.24 mg/dL Final  . Calcium 02/02/2018 9.4  8.9 - 10.3 mg/dL Final  . Total Protein 02/02/2018 6.6  6.5 - 8.1 g/dL Final  . Albumin 02/02/2018 4.0  3.5 - 5.0 g/dL Final  . AST 02/02/2018 22  15 - 41 U/L Final  . ALT 02/02/2018 22  17 - 63 U/L Final  . Alkaline Phosphatase 02/02/2018 42  38 - 126 U/L Final  . Total Bilirubin 02/02/2018 0.4  0.3 - 1.2 mg/dL Final  . GFR calc non Af Amer 02/02/2018 >60  >60 mL/min Final  . GFR calc Af Amer 02/02/2018 >60  >60 mL/min Final   Comment: (NOTE) The eGFR has been calculated using the CKD EPI equation. This calculation has not been validated in all clinical situations. eGFR's persistently <60 mL/min signify possible Chronic Kidney Disease.   Georgiann Hahn gap 02/02/2018 8  5 - 15 Final   Performed at [facility], [place] [address] [place], [place] [address]  . Prothrombin Time 02/02/2018 13.7  11.4 - 15.2 seconds Final  . INR 02/02/2018 1.06   Final   Performed at [facility], [place] [address] [place], [place] [address]  . ABO/RH(D) 02/02/2018 B POS   Final  . Antibody Screen 02/02/2018 NEG   Final  . Sample Expiration 02/02/2018 02/11/2018   Final  . Extend sample reason 02/02/2018    Final                   Value:NO TRANSFUSIONS OR PREGNANCY IN THE PAST 3 MONTHS Performed at [facility], [place] [address] [place], [place] [address]   . MRSA, PCR 02/02/2018 NEGATIVE  NEGATIVE Final  . Staphylococcus aureus 02/02/2018 NEGATIVE  NEGATIVE Final   Comment: (NOTE) The Xpert SA Assay (FDA approved for NASAL specimens in patients 69 years of age and older), is one component of a comprehensive surveillance program. It is not intended to diagnose infection nor to guide or monitor treatment. Performed at [facility], [place] [address] [place], [place] [address]   . ABO/RH(D) 02/02/2018    Final                   Value:B POS Performed at [facility], [place] [address], [place], [place] [address]      X-Rays:No results found.  EKG: Orders placed or performed in visit on 11/16/17  . EKG 12-Lead     Hospital Course: Troy [name] is a 67 y.o.  who was admitted to [facility]. They were brought to the operating room on 02/08/2018 and underwent Procedure(s): RIGHT TOTAL KNEE ARTHROPLASTY.  Patient tolerated the procedure well and was later transferred to the recovery room and then to the orthopaedic floor for postoperative care.  They were given PO and IV analgesics for pain control following their surgery.  They were given 24 hours of postoperative antibiotics of  Anti-infectives (From admission, onward)   Start     Dose/Rate Route Frequency  Ordered Stop   02/08/18 1630  ceFAZolin (ANCEF) IVPB 2g/100 mL premix     2 g 200 mL/hr over 30 Minutes Intravenous Every 6 hours 02/08/18 1513 02/08/18 2122   02/08/18 0749  ceFAZolin (ANCEF) IVPB 2g/100 mL premix     2 g 200 mL/hr over 30 Minutes Intravenous On call to O.R. 02/08/18 1610 02/08/18 1033     and started on DVT prophylaxis in the form of Xarelto.   PT and OT were ordered for total joint protocol.  Discharge planning consulted to help with postop disposition and equipment needs.  Patient had a decent night on the evening of surgery but with some pain.  They started to get up OOB with therapy on day one. Hemovac drain was pulled without difficulty.  Continued to work with therapy into day two.  Dressing was changed on day two and the incision was healing well.  Setup to go home on day two but had difficulty voiding requiring I&O caths and some mental status changes.  Patient developed some confusion with the narcotics on day two.  Backed off medications and his cognitive function improved.  Reduced the medications and kept another day. He also required I&O caths and foley was left in place.   By day three, the patient had progressed with therapy and meeting their goals. Unfortunatley had to have the foley replaced. Incision was healing well. Converted foley to leg bag and D/C with foley. Patient was seen in rounds and was ready to go home on day three.  Up with therapy Diet- Cardiac diet Follow up- in2weeks with Dr. Wynelle Link Follow up with Urology next week. Activity- WBAT Disposition- Home Condition Upon Discharge- Stable D/C Meds- See DC Summary DVT Prophylaxis- Xarelto     Discharge Instructions    Call MD / Call 911   Complete by:  As directed    If you experience chest pain or shortness of breath, CALL 911 and be transported to the hospital emergency room.  If you develope a fever above 101 F, pus (white drainage) or increased drainage or redness at the wound, or  calf pain, call your surgeon's office.   Change dressing   Complete by:  As directed    Change dressing daily with sterile 4 x 4 inch gauze dressing and apply TED hose. Do not submerge the incision under water.   Constipation Prevention   Complete by:  As directed    Drink plenty of fluids.  Prune juice may be helpful.  You may use a stool softener, such as Colace (over the counter) 100 mg twice a day.  Use MiraLax (over the counter) for constipation as needed.   Diet - low sodium heart healthy   Complete by:  As directed    Discharge instructions   Complete by:  As directed    Take Xarelto for two and a half more weeks, then discontinue Xarelto. Once the patient has completed the blood thinner regimen, then take a Baby 81 mg  Aspirin daily for three more weeks.   Pick up stool softner and laxative for home use following surgery while on pain medications. Do not submerge incision under water. Please use good hand washing techniques while changing dressing each day. May shower starting three days after surgery. Please use a clean towel to pat the incision dry following showers. Continue to use ice for pain and swelling after surgery. Do not use any lotions or creams on the incision until instructed by your surgeon.  Wear both TED hose on both legs during the day every day for three weeks, but may remove the TED hose at night at home.  Postoperative Constipation Protocol  Constipation - defined medically as fewer than three stools per week and severe constipation as less than one stool per week.  One of the most common issues patients have following surgery is constipation.  Even if you have a regular bowel pattern at home, your normal regimen is likely to be disrupted due to multiple reasons following surgery.  Combination of anesthesia, postoperative narcotics, change in appetite and fluid intake all can affect your bowels.  In order to avoid complications following surgery, here are some  recommendations in order to help you during your recovery period.  Colace (docusate) - Pick up an over-the-counter form of Colace or another stool softener and take twice a day as long as you are requiring postoperative pain medications.  Take with a full glass of water daily.  If you experience loose stools or diarrhea, hold the colace until you stool forms back up.  If your symptoms do not get better within 1 week or if they get worse, check with your doctor.  Dulcolax (bisacodyl) - Pick up over-the-counter and take as directed by the product packaging as needed to assist with the movement of your bowels.  Take with a full glass of water.  Use this product as needed if not relieved by Colace only.   MiraLax (polyethylene glycol) - Pick up over-the-counter to have on hand.  MiraLax is a solution that will increase the amount of water in your bowels to assist with bowel movements.  Take as directed and can mix with a glass of water, juice, soda, coffee, or tea.  Take if you go more than two days without a movement. Do not use MiraLax more than once per day. Call your doctor if you are still constipated or irregular after using this medication for 7 days in a row.  If you continue to have problems with postoperative constipation, please contact the office for further assistance and recommendations.  If you experience "the worst abdominal pain ever" or develop nausea or vomiting, please contact the office immediatly for further recommendations for treatment.   Do not put a pillow under the knee. Place it under the heel.   Complete by:  As directed    Do not sit on low chairs, stoools or toilet seats, as it may be difficult to get up from low surfaces   Complete by:  As directed    Driving restrictions   Complete by:  As directed    No driving until released by the physician.   Increase activity slowly as tolerated   Complete by:  As directed    Lifting restrictions   Complete by:  As directed     No lifting until released by the physician.   Patient may shower   Complete by:  As directed    You may shower without a dressing  once there is no drainage.  Do not wash over the wound.  If drainage remains, do not shower until drainage stops.   TED hose   Complete by:  As directed    Use stockings (TED hose) for 3 weeks on both leg(s).  You may remove them at night for sleeping.   Weight bearing as tolerated   Complete by:  As directed    Laterality:  right   Extremity:  Lower     Allergies as of 02/11/2018      Reactions   Contrast Media [iodinated Diagnostic Agents] Other (See Comments)   V-Fib arrest during cardiac cath suspected d/t contrast dye in 2016   Gluten Meal Other (See Comments)   REACTION: Celiac disease (severe stomach pain)   Metrizamide Other (See Comments)   V-Fib arrest during cardiac cath suspected d/t contrast dye in 2016   Tizanidine Anxiety   Depression   Whey Other (See Comments)   REACTION: Celiac disease (severe stomach pain)   Morphine And Related Other (See Comments)   REACTION: "REALLY BAD STOMACH PAIN"   Apple Diarrhea      Medication List    STOP taking these medications   Fish Oil 1000 MG Cpdr   magnesium gluconate 500 MG tablet Commonly known as:  MAGONATE   TURMERIC PO     TAKE these medications   atorvastatin 80 MG tablet Commonly known as:  LIPITOR Take 1 tablet (80 mg total) by mouth daily.   ezetimibe 10 MG tablet Commonly known as:  ZETIA Take 1 tablet (10 mg total) by mouth daily.   gabapentin 300 MG capsule Commonly known as:  NEURONTIN Take 1 capsule (300 mg total) by mouth 3 (three) times daily. Gabapentin 300 mg Protocol Take a 300 mg capsule three times a day for two weeks, Then a 300 mg capsule twice a day for two weeks, Then a 300 mg capsule once a day for two weeks, then discontinue the Gabapentin.   HYDROcodone-acetaminophen 5-325 MG tablet Commonly known as:  NORCO/VICODIN Take 1-2 tablets by mouth every 4  (four) hours as needed for moderate pain or severe pain.   LORazepam 0.5 MG tablet Commonly known as:  ATIVAN Take 0.5 mg by mouth daily as needed for anxiety.   losartan 100 MG tablet Commonly known as:  COZAAR Take 1 tablet (100 mg total) by mouth daily.   methocarbamol 500 MG tablet Commonly known as:  ROBAXIN Take 1 tablet (500 mg total) by mouth every 6 (six) hours as needed for muscle spasms.   metoprolol tartrate 100 MG tablet Commonly known as:  LOPRESSOR Take 100 mg by mouth 2 (two) times daily.   nitroGLYCERIN 0.3 MG SL tablet Commonly known as:  NITROSTAT Place 1 tablet (0.3 mg total) under the tongue every 5 (five) minutes as needed for chest pain.   oxymetazoline 0.05 % nasal spray Commonly known as:  AFRIN Place 1 spray into both nostrils 2 (two) times daily as needed (FOR SINUS CONGESTION.).   spironolactone 25 MG tablet Commonly known as:  ALDACTONE Take 1 tablet (25 mg total) by mouth daily.   tamsulosin 0.4 MG Caps capsule Commonly known as:  FLOMAX Take 1 capsule (0.4 mg total) by mouth daily after supper.   traMADol 50 MG tablet Commonly known as:  ULTRAM Take 1-2 tablets (50-100 mg total) by mouth every 6 (six) hours as needed for moderate pain (not relieved by oxycodone).   XARELTO 20 MG Tabs tablet Generic drug:  rivaroxaban TAKE 1 TABLET(20 MG) BY MOUTH DAILY WITH SUPPER What changed:  See the new instructions.            Discharge Care Instructions  (From admission, onward)        Start     Ordered   02/09/18 0000  Weight bearing as tolerated    Question Answer Comment  Laterality right   Extremity Lower      02/09/18 2138   02/09/18 0000  Change dressing    Comments:  Change dressing daily with sterile 4 x 4 inch gauze dressing and apply TED hose. Do not submerge the incision under water.   02/09/18 2138     Follow-up Information    Gaynelle Arabian, MD. Schedule an appointment as soon as possible for a visit on 02/23/2018.     Specialty:  Orthopedic Surgery Contact information: [address] [place] [address] [phone]        [facility]. Schedule an appointment as soon as possible for a visit.   Why:  Call ASAP and [place] appointment for middle of next week for voiding trial.  Being sent home with indwelling foley catheter/leg bag due to urinary retention. Contact information: [place] [phone]          Signed: Arlee Muslim, PA-C Orthopaedic Surgery 02/11/2018, 11:35 AM

## 2018-02-09 NOTE — Progress Notes (Signed)
Spoke with patient at bedside. Confirmed plan for OP PT, already arranged. Has RW and 3n1. [phone] 

## 2018-02-09 NOTE — Progress Notes (Signed)
Subjective: 1 Day Post-Op Procedure(s) (LRB): RIGHT TOTAL KNEE ARTHROPLASTY (Right) Patient reports pain as mild.   Patient seen in rounds for Dr. Wynelle Link. Patient is well, but has had some minor complaints of pain in the knee, requiring pain medications We will start therapy today.  Plan is to go Home after hospital stay.  Objective: Vital signs in last 24 hours: Temp:  [97.7 F (36.5 C)-98.5 F (36.9 C)] 98.5 F (36.9 C) (04/02 1355) Pulse Rate:  [55-67] 67 (04/02 1355) Resp:  [13-18] 17 (04/02 1355) BP: (125-150)/(79-89) 150/79 (04/02 1355) SpO2:  [93 %-100 %] 93 % (04/02 1355)  Intake/Output from previous day:  Intake/Output Summary (Last 24 hours) at 02/09/2018 1827 Last data filed at 02/09/2018 1658 Gross per 24 hour  Intake 3418.33 ml  Output 1280 ml  Net 2138.33 ml    Intake/Output this shift: Total I/O In: 1788.3 [P.O.:1080; I.V.:708.3] Out: 350 [Urine:350]  Labs: Recent Labs    02/09/18 0535  HGB 13.0   Recent Labs    02/09/18 0535  WBC 10.4  RBC 4.45  HCT 39.3  PLT 161   Recent Labs    02/09/18 0535  NA 139  K 4.5  CL 107  CO2 26  BUN 18  CREATININE 0.85  GLUCOSE 129*  CALCIUM 8.7*   No results for input(s): LABPT, INR in the last 72 hours.  EXAM General - Patient is Alert and Appropriate Extremity - Neurovascular intact Dressing - dressing C/D/I Motor Function - intact, moving foot and toes well on exam.  Hemovac pulled without difficulty.  Past Medical History:  Diagnosis Date  . Arthritis    "knees" (01/02/2017)  . CAD (coronary artery disease)    a. s/p PCI in 2010 in [place]. b. Unstable angina/LHC 11/16/735 complicated by V fib arrest after contrast injection. Cath 06/19/2015 s/p DES to mid LAD and RPDA. c. 11/2016: chest pain/ abnormal nuclear stress test prompting cath 11/13/16 showing 80% ostial diag, 60% mid RCA, no acute lesions, medical therapy recommended.  . Dyslipidemia   . Essential hypertension   . Lung nodule < 6cm  on CT 06/16/15   Right lung  . Mitral regurgitation    a. Mild-mod by echo 06/2015. b. not seen other than trivial in 2018.  . OSA on CPAP   . Persistent atrial fibrillation ([place])    a. 06/2015 noted to be in new a-fib when arrived with unstable angina. Converted to NSR after being shocked in the cath lab for vfib;  b. 06/2015 Eliquis initiated as PAF noted on event monitor. c. Recurrent atrial fib?flutter 10/2016, issues with recurrent AF requiring multiple DCCVs in 02/2017. Failed sotalol, not candidate for Tikosyn due to cost/QTC, Multaq not felt likely strong enough.  . Pulmonary nodule 07/2015   a. 1.5 x 1.2 cm smoothly marginated nodule in the central aspect of the right lower lobe with recommendation correlation with nonemergent PET-CT to exclude a neoplasm , stable by CT 07/2016  . Severe left ventricular hypertrophy   . Ventricular fibrillation ([place])    a. occured during cath 06/18/2015.    Assessment/Plan: 1 Day Post-Op Procedure(s) (LRB): RIGHT TOTAL KNEE ARTHROPLASTY (Right) Principal Problem:   OA (osteoarthritis) of knee  Estimated body mass index is 31.8 kg/m as calculated from the following:   Height as of this encounter: 5\' 11"  (1.803 m).   Weight as of this encounter: 103.4 kg (228 lb). Advance diet Up with therapy Plan for discharge tomorrow  - straight to outpatient  DVT Prophylaxis - Xarelto Weight-Bearing as tolerated to right leg D/C O2 and Pulse OX and try on Room Air  Arlee Muslim, PA-C Orthopaedic Surgery 02/09/2018, 6:27 PM

## 2018-02-09 NOTE — Evaluation (Signed)
Physical Therapy Evaluation Patient Details Name: Troy [name] MRN: [id number] DOB: 1951/03/20 Today's Date: 02/09/2018   History of Present Illness  67 yo male s/p R TKA 02/08/18  Clinical Impression  On eval, pt required Min guard-Min assist for mobility. He walked ~60 feet with a RW. Pain rated 7/10 with activity. Will follow and progress activity as tolerated.     Follow Up Recommendations Follow surgeon's recommendation for DC plan and follow-up therapies    Equipment Recommendations  Other (comment)(may possibly need a RW. Friend is checking to see if she has one he can borrow)    Recommendations for Other Services       Precautions / Restrictions Precautions Precautions: Fall;Knee Required Braces or Orthoses: Knee Immobilizer - Right Knee Immobilizer - Right: Discontinue once straight leg raise with < 10 degree lag Restrictions Weight Bearing Restrictions: No Other Position/Activity Restrictions: WBAT      Mobility  Bed Mobility Overal bed mobility: Needs Assistance Bed Mobility: Supine to Sit     Supine to sit: Min guard;HOB elevated     General bed mobility comments: close guard for safety.   Transfers Overall transfer level: Needs assistance Equipment used: Rolling walker (2 wheeled) Transfers: Sit to/from Stand Sit to Stand: From elevated surface;Min guard         General transfer comment: close guard for safety. VCS safety, hand/LE placement.   Ambulation/Gait Ambulation/Gait assistance: Min assist Ambulation Distance (Feet): 65 Feet Assistive device: Rolling walker (2 wheeled) Gait Pattern/deviations: Step-to pattern;Antalgic     General Gait Details: Intermittent assist to steady. VCS safety, sequence.   Stairs            Wheelchair Mobility    Modified Rankin (Stroke Patients Only)       Balance                                             Pertinent Vitals/Pain Pain Assessment: 0-10 Pain Score: 7  Pain  Location: R thigh with activity Pain Descriptors / Indicators: Aching;Sore;Discomfort Pain Intervention(s): Monitored during session;Repositioned;Ice applied    Home Living Family/patient expects to be discharged to:: Private residence Living Arrangements: Non-relatives/Friends Available Help at Discharge: Friend(s) Type of Home: House Home Access: Stairs to enter Entrance Stairs-Rails: Right Entrance Stairs-Number of Steps: 2 Home Layout: One level Home Equipment: Environmental consultant - standard Additional Comments: friend stated she may have a RW pt can borrow-she will check    Prior Function Level of Independence: Independent               Hand Dominance        Extremity/Trunk Assessment   Upper Extremity Assessment Upper Extremity Assessment: Overall WFL for tasks assessed    Lower Extremity Assessment Lower Extremity Assessment: Generalized weakness(s/p R TKA)    Cervical / Trunk Assessment Cervical / Trunk Assessment: Normal  Communication   Communication: No difficulties  Cognition Arousal/Alertness: Awake/alert Behavior During Therapy: WFL for tasks assessed/performed Overall Cognitive Status: Within Functional Limits for tasks assessed                                        General Comments      Exercises Total Joint Exercises Ankle Circles/Pumps: AROM;Both;10 reps;Supine Quad Sets: AROM;Both;10 reps;Supine Heel Slides: AAROM;Right;10 reps;Supine Hip ABduction/ADduction: AAROM;Right;10 reps;Supine  Straight Leg Raises: AAROM;Right;10 reps;Supine Goniometric ROM: ~10-60 degrees   Assessment/Plan    PT Assessment Patient needs continued PT services  PT Problem List Decreased strength;Decreased mobility;Decreased range of motion;Decreased activity tolerance;Decreased balance;Decreased knowledge of use of DME;Pain       PT Treatment Interventions DME instruction;Gait training;Therapeutic activities;Therapeutic exercise;Patient/family  education;Functional mobility training;Stair training;Balance training    PT Goals (Current goals can be found in the Care Plan section)  Acute Rehab PT Goals Patient Stated Goal: regain PLOF. less pain.  PT Goal Formulation: With patient Time For Goal Achievement: 02/23/18 Potential to Achieve Goals: Good    Frequency 7X/week   Barriers to discharge        Co-evaluation               AM-PAC PT "6 Clicks" Daily Activity  Outcome Measure Difficulty turning over in bed (including adjusting bedclothes, sheets and blankets)?: A Little Difficulty moving from lying on back to sitting on the side of the bed? : A Little Difficulty sitting down on and standing up from a chair with arms (e.g., wheelchair, bedside commode, etc,.)?: A Little Help needed moving to and from a bed to chair (including a wheelchair)?: A Little Help needed walking in hospital room?: A Little Help needed climbing 3-5 steps with a railing? : A Little 6 Click Score: 18    End of Session Equipment Utilized During Treatment: Gait belt Activity Tolerance: Patient tolerated treatment well Patient left: in chair;with call bell/phone within reach;with family/visitor present   PT Visit Diagnosis: Pain;Other abnormalities of gait and mobility (R26.89);Difficulty in walking, not elsewhere classified (R26.2) Pain - Right/Left: Right Pain - part of body: Knee    Time: 4680-3212 PT Time Calculation (min) (ACUTE ONLY): 22 min   Charges:   PT Evaluation $PT Eval Moderate Complexity: 1 Mod     PT G Codes:          Weston Anna, MPT Pager: [phone]

## 2018-02-09 NOTE — Discharge Instructions (Signed)
° °Dr. Frank Aluisio °Total Joint Specialist °[facility] °[address] °[place], [place] [address] °[phone] ° °TOTAL KNEE REPLACEMENT POSTOPERATIVE DIRECTIONS ° °Knee Rehabilitation, Guidelines Following Surgery  °Results after knee surgery are often greatly improved when you follow the exercise, range of motion and muscle strengthening exercises prescribed by your doctor. Safety measures are also important to protect the knee from further injury. Any time any of these exercises cause you to have increased pain or swelling in your knee joint, decrease the amount until you are comfortable again and slowly increase them. If you have problems or questions, call your caregiver or physical therapist for advice.  ° °HOME CARE INSTRUCTIONS  °Remove items at home which could result in a fall. This includes throw rugs or furniture in walking pathways.  °· ICE to the affected knee every three hours for 30 minutes at a time and then as needed for pain and swelling.  Continue to use ice on the knee for pain and swelling from surgery. You may notice swelling that will progress down to the foot and ankle.  This is normal after surgery.  Elevate the leg when you are not up walking on it.   °· Continue to use the breathing machine which will help keep your temperature down.  It is common for your temperature to cycle up and down following surgery, especially at night when you are not up moving around and exerting yourself.  The breathing machine keeps your lungs expanded and your temperature down. °· Do not place pillow under knee, focus on keeping the knee straight while resting ° °DIET °You may resume your previous home diet once your are discharged from the hospital. ° °DRESSING / WOUND CARE / SHOWERING °You may shower 3 days after surgery, but keep the wounds dry during showering.  You may use an occlusive plastic wrap (Press'n Seal for example), NO SOAKING/SUBMERGING IN THE BATHTUB.  If the bandage gets  wet, change with a clean dry gauze.  If the incision gets wet, pat the wound dry with a clean towel. °You may start showering once you are discharged home but do not submerge the incision under water. Just pat the incision dry and apply a dry gauze dressing on daily. °Change the surgical dressing daily and reapply a dry dressing each time. ° °ACTIVITY °Walk with your walker as instructed. °Use walker as long as suggested by your caregivers. °Avoid periods of inactivity such as sitting longer than an hour when not asleep. This helps prevent blood clots.  °You may resume a sexual relationship in one month or when given the OK by your doctor.  °You may return to work once you are cleared by your doctor.  °Do not drive a car for 6 weeks or until released by you surgeon.  °Do not drive while taking narcotics. ° °WEIGHT BEARING °Weight bearing as tolerated with assist device (walker, cane, etc) as directed, use it as long as suggested by your surgeon or therapist, typically at least 4-6 weeks. ° °POSTOPERATIVE CONSTIPATION PROTOCOL °Constipation - defined medically as fewer than three stools per week and severe constipation as less than one stool per week. ° °One of the most common issues patients have following surgery is constipation.  Even if you have a regular bowel pattern at home, your normal regimen is likely to be disrupted due to multiple reasons following surgery.  Combination of anesthesia, postoperative narcotics, change in appetite and fluid intake all can affect your bowels.    In order to avoid complications following surgery, here are some recommendations in order to help you during your recovery period. ° °Colace (docusate) - Pick up an over-the-counter form of Colace or another stool softener and take twice a day as long as you are requiring postoperative pain medications.  Take with a full glass of water daily.  If you experience loose stools or diarrhea, hold the colace until you stool forms back up.  If  your symptoms do not get better within 1 week or if they get worse, check with your doctor. ° °Dulcolax (bisacodyl) - Pick up over-the-counter and take as directed by the product packaging as needed to assist with the movement of your bowels.  Take with a full glass of water.  Use this product as needed if not relieved by Colace only.  ° °MiraLax (polyethylene glycol) - Pick up over-the-counter to have on hand.  MiraLax is a solution that will increase the amount of water in your bowels to assist with bowel movements.  Take as directed and can mix with a glass of water, juice, soda, coffee, or tea.  Take if you go more than two days without a movement. °Do not use MiraLax more than once per day. Call your doctor if you are still constipated or irregular after using this medication for 7 days in a row. ° °If you continue to have problems with postoperative constipation, please contact the office for further assistance and recommendations.  If you experience "the worst abdominal pain ever" or develop nausea or vomiting, please contact the office immediatly for further recommendations for treatment. ° °ITCHING ° If you experience itching with your medications, try taking only a single pain pill, or even half a pain pill at a time.  You can also use Benadryl over the counter for itching or also to help with sleep.  ° °TED HOSE STOCKINGS °Wear the elastic stockings on both legs for three weeks following surgery during the day but you may remove then at night for sleeping. ° °MEDICATIONS °See your medication summary on the “After Visit Summary” that the nursing staff will review with you prior to discharge.  You may have some home medications which will be placed on hold until you complete the course of blood thinner medication.  It is important for you to complete the blood thinner medication as prescribed by your surgeon.  Continue your approved medications as instructed at time of discharge. ° °PRECAUTIONS °If you  experience chest pain or shortness of breath - call 911 immediately for transfer to the hospital emergency department.  °If you develop a fever greater that 101 F, purulent drainage from wound, increased redness or drainage from wound, foul odor from the wound/dressing, or calf pain - CONTACT YOUR SURGEON.   °                                                °FOLLOW-UP APPOINTMENTS °Make sure you keep all of your appointments after your operation with your surgeon and caregivers. You should call the office at the above phone number and make an appointment for approximately two weeks after the date of your surgery or on the date instructed by your surgeon outlined in the "After Visit Summary". ° ° °RANGE OF MOTION AND STRENGTHENING EXERCISES  °Rehabilitation of the knee is important following a knee injury or   an operation. After just a few days of immobilization, the muscles of the thigh which control the knee become weakened and shrink (atrophy). Knee exercises are designed to build up the tone and strength of the thigh muscles and to improve knee motion. Often times heat used for twenty to thirty minutes before working out will loosen up your tissues and help with improving the range of motion but do not use heat for the first two weeks following surgery. These exercises can be done on a training (exercise) mat, on the floor, on a table or on a bed. Use what ever works the best and is most comfortable for you Knee exercises include:  °Leg Lifts - While your knee is still immobilized in a splint or cast, you can do straight leg raises. Lift the leg to 60 degrees, hold for 3 sec, and slowly lower the leg. Repeat 10-20 times 2-3 times daily. Perform this exercise against resistance later as your knee gets better.  °Quad and Hamstring Sets - Tighten up the muscle on the front of the thigh (Quad) and hold for 5-10 sec. Repeat this 10-20 times hourly. Hamstring sets are done by pushing the foot backward against an object  and holding for 5-10 sec. Repeat as with quad sets.  °· Leg Slides: Lying on your back, slowly slide your foot toward your buttocks, bending your knee up off the floor (only go as far as is comfortable). Then slowly slide your foot back down until your leg is flat on the floor again. °· Angel Wings: Lying on your back spread your legs to the side as far apart as you can without causing discomfort.  °A rehabilitation program following serious knee injuries can speed recovery and prevent re-injury in the future due to weakened muscles. Contact your doctor or a physical therapist for more information on knee rehabilitation.  ° °IF YOU ARE TRANSFERRED TO A SKILLED REHAB FACILITY °If the patient is transferred to a skilled rehab facility following release from the hospital, a list of the current medications will be sent to the facility for the patient to continue.  When discharged from the skilled rehab facility, please have the facility set up the patient's Home Health Physical Therapy prior to being released. Also, the skilled facility will be responsible for providing the patient with their medications at time of release from the facility to include their pain medication, the muscle relaxants, and their blood thinner medication. If the patient is still at the rehab facility at time of the two week follow up appointment, the skilled rehab facility will also need to assist the patient in arranging follow up appointment in our office and any transportation needs. ° °MAKE SURE YOU:  °Understand these instructions.  °Get help right away if you are not doing well or get worse.  ° ° °Pick up stool softner and laxative for home use following surgery while on pain medications. °Do not submerge incision under water. °Please use good hand washing techniques while changing dressing each day. °May shower starting three days after surgery. °Please use a clean towel to pat the incision dry following showers. °Continue to use ice for  pain and swelling after surgery. °Do not use any lotions or creams on the incision until instructed by your surgeon. ° °Take Xarelto for two and a half more weeks following discharge from the hospital, then discontinue Xarelto. °Once the patient has completed the blood thinner regimen, then take a Baby 81 mg Aspirin daily for three   more weeks.

## 2018-02-09 NOTE — Progress Notes (Signed)
Physical Therapy Treatment Patient Details Name: Troy [name] MRN: [id number] DOB: September 06, 1951 Today's Date: 02/09/2018    History of Present Illness 67 yo male s/p R TKA 02/08/18    PT Comments    Pt continues to participate well. Plan is for possible d/c home on tomorrow.    Follow Up Recommendations  Follow surgeon's recommendation for DC plan and follow-up therapies     Equipment Recommendations  (may possibly need RW)    Recommendations for Other Services       Precautions / Restrictions Precautions Precautions: Fall;Knee Required Braces or Orthoses: Knee Immobilizer - Right Knee Immobilizer - Right: Discontinue once straight leg raise with < 10 degree lag Restrictions Weight Bearing Restrictions: No Other Position/Activity Restrictions: WBAT    Mobility  Bed Mobility Overal bed mobility: Needs Assistance Bed Mobility: Supine to Sit;Sit to Supine     Supine to sit: Min assist Sit to supine: Min assist;HOB elevated   General bed mobility comments: small amount of assist for R LE.   Transfers Overall transfer level: Needs assistance Equipment used: Rolling walker (2 wheeled) Transfers: Sit to/from Stand Sit to Stand: Min guard;From elevated surface         General transfer comment: close guard for safety. VCS safety, hand/LE placement.   Ambulation/Gait Ambulation/Gait assistance: Min guard Ambulation Distance (Feet): 115 Feet Assistive device: Rolling walker (2 wheeled) Gait Pattern/deviations: Step-to pattern;Antalgic;Step-through pattern;Decreased stride length     General Gait Details: close guard for safety. VCs safety.    Stairs            Wheelchair Mobility    Modified Rankin (Stroke Patients Only)       Balance                                            Cognition Arousal/Alertness: Awake/alert Behavior During Therapy: WFL for tasks assessed/performed Overall Cognitive Status: Within Functional Limits for  tasks assessed                                        Exercises   General Comments        Pertinent Vitals/Pain Pain Assessment: 0-10 Pain Score: 7  Pain Location: R thigh with activity Pain Descriptors / Indicators: Aching;Sore;Discomfort Pain Intervention(s): Monitored during session;Repositioned;Ice applied    Home Living Family/patient expects to be discharged to:: Private residence Living Arrangements: Non-relatives/Friends Available Help at Discharge: Friend(s) Type of Home: House Home Access: Stairs to enter Entrance Stairs-Rails: Right Home Layout: One level Home Equipment: Environmental consultant - standard Additional Comments: friend stated she may have a RW pt can borrow-she will check    Prior Function Level of Independence: Independent          PT Goals (current goals can now be found in the care plan section) Acute Rehab PT Goals Patient Stated Goal: regain PLOF. less pain.  PT Goal Formulation: With patient Time For Goal Achievement: 02/23/18 Potential to Achieve Goals: Good Progress towards PT goals: Progressing toward goals    Frequency    7X/week      PT Plan Current plan remains appropriate    Co-evaluation              AM-PAC PT "6 Clicks" Daily Activity  Outcome Measure  Difficulty turning over in bed (  including adjusting bedclothes, sheets and blankets)?: A Little Difficulty moving from lying on back to sitting on the side of the bed? : A Little Difficulty sitting down on and standing up from a chair with arms (e.g., wheelchair, bedside commode, etc,.)?: A Little Help needed moving to and from a bed to chair (including a wheelchair)?: A Little Help needed walking in hospital room?: A Little Help needed climbing 3-5 steps with a railing? : A Little 6 Click Score: 18    End of Session Equipment Utilized During Treatment: Gait belt Activity Tolerance: Patient tolerated treatment well Patient left: in bed;with call bell/phone  within reach;with family/visitor present   PT Visit Diagnosis: Pain;Other abnormalities of gait and mobility (R26.89);Difficulty in walking, not elsewhere classified (R26.2) Pain - Right/Left: Right Pain - part of body: Knee     Time: 7972-8206 PT Time Calculation (min) (ACUTE ONLY): 9 min  Charges:  $Gait Training: 8-22 mins                    G Codes:          Weston Anna, MPT Pager: [phone]

## 2018-02-09 NOTE — Progress Notes (Signed)
Post foley catheter removal, pt c/o frequent urination (every 15 minutes) with an output of 50-100 ml each time. Pt was bladder scanned post void, which resulted in a remaining 492 ml at 1635. Pt was informed that a straight catheter was indicated d/t urinary retention/inability to empty bladder. Pt refused being straight cathed. Pt reported an extensive hx of frequent urination and inability to empty his bladder because of quickly refilling after voiding. Pt was educated about the protocol for urinary retension and pt refused a second time.

## 2018-02-10 LAB — BASIC METABOLIC PANEL
Anion gap: 8 (ref 5–15)
BUN: 16 mg/dL (ref 6–20)
CO2: 29 mmol/L (ref 22–32)
CREATININE: 0.85 mg/dL (ref 0.61–1.24)
Calcium: 9.2 mg/dL (ref 8.9–10.3)
Chloride: 102 mmol/L (ref 101–111)
GFR calc Af Amer: >60 mL/min (ref >60)
Glucose, Bld: 113 mg/dL — ABNORMAL HIGH (ref 65–99)
Potassium: 4.2 mmol/L (ref 3.5–5.1)
SODIUM: 139 mmol/L (ref 135–145)

## 2018-02-10 LAB — CBC
HCT: 41.4 % (ref 39.0–52.0)
HEMOGLOBIN: 13.5 g/dL (ref 13.0–17.0)
MCH: 29.2 pg (ref 26.0–34.0)
MCHC: 32.6 g/dL (ref 30.0–36.0)
MCV: 89.6 fL (ref 78.0–100.0)
PLATELETS: 182 10*3/uL (ref 150–400)
RBC: 4.62 MIL/uL (ref 4.22–5.81)
RDW: 13.8 % (ref 11.5–15.5)
WBC: 11.5 10*3/uL — ABNORMAL HIGH (ref 4.0–10.5)

## 2018-02-10 MED ORDER — TAMSULOSIN HCL 0.4 MG PO CAPS
0.4000 mg | ORAL_CAPSULE | Freq: Every day | ORAL | Status: DC
  Administered 2018-02-10: 0.4 mg via ORAL
  Filled 2018-02-10: qty 1

## 2018-02-10 MED ORDER — GABAPENTIN 300 MG PO CAPS: 300.0000 mg | ORAL_CAPSULE | Freq: Three times a day (TID) | ORAL | 0 refills | Status: DC

## 2018-02-10 MED ORDER — TAMSULOSIN HCL 0.4 MG PO CAPS: 0.4000 mg | ORAL_CAPSULE | ORAL | Status: DC

## 2018-02-10 MED ORDER — METHOCARBAMOL 500 MG PO TABS: 500.0000 mg | ORAL_TABLET | Freq: Four times a day (QID) | ORAL | 0 refills | Status: DC | PRN

## 2018-02-10 NOTE — Progress Notes (Signed)
02/10/18  0915  Pt c/o feeling dizziness and the walls in his room moving. Paged MD waiting for a response.

## 2018-02-10 NOTE — Progress Notes (Signed)
Subjective: 2 Days Post-Op Procedure(s) (LRB): RIGHT TOTAL KNEE ARTHROPLASTY (Right) Patient reports pain as mild and moderate.   Patient seen in rounds by Dr. Wynelle Link. Patient is well, but has had some minor complaints of pain in the knee, requiring pain medications Patient is ready to go home later today.  Objective: Vital signs in last 24 hours: Temp:  [97.7 F (36.5 C)-99.4 F (37.4 C)] 99.4 F (37.4 C) (04/03 0457) Pulse Rate:  [60-100] 70 (04/03 0457) Resp:  [17-18] 17 (04/03 0457) BP: (146-176)/(79-91) 164/87 (04/03 0457) SpO2:  [93 %-96 %] 96 % (04/03 0457)  Intake/Output from previous day:  Intake/Output Summary (Last 24 hours) at 02/10/2018 0736 Last data filed at 02/10/2018 0458 Gross per 24 hour  Intake 2391.83 ml  Output 1240 ml  Net 1151.83 ml    Intake/Output this shift: No intake/output data recorded.  Labs: Recent Labs    02/09/18 0535 02/10/18 0548  HGB 13.0 13.5   Recent Labs    02/09/18 0535 02/10/18 0548  WBC 10.4 11.5*  RBC 4.45 4.62  HCT 39.3 41.4  PLT 161 182   Recent Labs    02/09/18 0535 02/10/18 0548  NA 139 139  K 4.5 4.2  CL 107 102  CO2 26 29  BUN 18 16  CREATININE 0.85 0.85  GLUCOSE 129* 113*  CALCIUM 8.7* 9.2   No results for input(s): LABPT, INR in the last 72 hours.  EXAM: General - Patient is Alert, Appropriate and Oriented Extremity - Neurovascular intact Sensation intact distally Incision - clean, dry Motor Function - intact, moving foot and toes well on exam.   Assessment/Plan: 2 Days Post-Op Procedure(s) (LRB): RIGHT TOTAL KNEE ARTHROPLASTY (Right) Procedure(s) (LRB): RIGHT TOTAL KNEE ARTHROPLASTY (Right) Past Medical History:  Diagnosis Date  . Arthritis    "knees" (01/02/2017)  . CAD (coronary artery disease)    a. s/p PCI in 2010 in [place]. b. Unstable angina/LHC 04/11/3761 complicated by V fib arrest after contrast injection. Cath 06/19/2015 s/p DES to mid LAD and RPDA. c. 11/2016: chest pain/  abnormal nuclear stress test prompting cath 11/13/16 showing 80% ostial diag, 60% mid RCA, no acute lesions, medical therapy recommended.  . Dyslipidemia   . Essential hypertension   . Lung nodule < 6cm on CT 06/16/15   Right lung  . Mitral regurgitation    a. Mild-mod by echo 06/2015. b. not seen other than trivial in 2018.  . OSA on CPAP   . Persistent atrial fibrillation ([place])    a. 06/2015 noted to be in new a-fib when arrived with unstable angina. Converted to NSR after being shocked in the cath lab for vfib;  b. 06/2015 Eliquis initiated as PAF noted on event monitor. c. Recurrent atrial fib?flutter 10/2016, issues with recurrent AF requiring multiple DCCVs in 02/2017. Failed sotalol, not candidate for Tikosyn due to cost/QTC, Multaq not felt likely strong enough.  . Pulmonary nodule 07/2015   a. 1.5 x 1.2 cm smoothly marginated nodule in the central aspect of the right lower lobe with recommendation correlation with nonemergent PET-CT to exclude a neoplasm , stable by CT 07/2016  . Severe left ventricular hypertrophy   . Ventricular fibrillation ([place])    a. occured during cath 06/18/2015.   Principal Problem:   OA (osteoarthritis) of knee  Estimated body mass index is 31.8 kg/m as calculated from the following:   Height as of this encounter: 5\' 11"  (1.803 m).   Weight as of this encounter: 103.4 kg (228  lb). Up with therapy Diet - Cardiac diet Follow up - in 2 weeks Activity - WBAT Disposition - Home Condition Upon Discharge - Stable D/C Meds - See DC Summary DVT Prophylaxis - [place], PA-C Orthopaedic Surgery 02/10/2018, 7:36 AM

## 2018-02-10 NOTE — Progress Notes (Signed)
Physical Therapy Treatment Patient Details Name: Troy [name] MRN: [id number] DOB: [birth year] Today's Date: 02/10/2018    History of Present Illness 67 yo male s/p R TKA 02/08/18    PT Comments    Session limited by pt c/o "seeing things" on walls. He's worried he is having a reaction to his medicine. RN made aware. Reviewed/practiced exercises and gait training. Limited gait distance due to pt c/o dizziness. Unable to attempt stairs this session. Will have a 2nd session to see if pt can safely ambulate and negotiate stairs.     Follow Up Recommendations  Follow surgeon's recommendation for DC plan and follow-up therapies     Equipment Recommendations  None recommended by PT    Recommendations for Other Services       Precautions / Restrictions Precautions Precautions: Fall;Knee Required Braces or Orthoses: Knee Immobilizer - Right Knee Immobilizer - Right: Discontinue once straight leg raise with < 10 degree lag Restrictions Weight Bearing Restrictions: No Other Position/Activity Restrictions: WBAT    Mobility  Bed Mobility Overal bed mobility: Needs Assistance Bed Mobility: Supine to Sit     Supine to sit: Min assist     General bed mobility comments: small amount of assist for R LE.   Transfers Overall transfer level: Needs assistance Equipment used: Rolling walker (2 wheeled) Transfers: Sit to/from Stand Min guard            General transfer comment: close guard for safety. VCS safety, hand/LE placement.   Ambulation/Gait Ambulation/Gait assistance: Min guard Ambulation Distance (Feet): 30 Feet Assistive device: Rolling walker (2 wheeled) Gait Pattern/deviations: Step-to pattern;Antalgic;Step-through pattern;Decreased stride length     General Gait Details: close guard for safety. VCs safety. Pt unable to ambulate further due to dizziness.    Stairs            Wheelchair Mobility    Modified Rankin (Stroke Patients Only)        Balance                                            Cognition Arousal/Alertness: Awake/alert Behavior During Therapy: WFL for tasks assessed/performed Overall Cognitive Status: Within Functional Limits for tasks assessed                                        Exercises Total Joint Exercises Ankle Circles/Pumps: AROM;Both;10 reps;Supine Quad Sets: AROM;Both;10 reps;Supine Heel Slides: AAROM;Right;10 reps;Supine Hip ABduction/ADduction: AAROM;Right;10 reps;Supine Straight Leg Raises: AAROM;Right;10 reps;Supine Goniometric ROM: ~10-60 degrees    General Comments        Pertinent Vitals/Pain Pain Assessment: 0-10 Pain Score: 7  Pain Location: R thigh with activity Pain Descriptors / Indicators: Aching;Sore;Discomfort Pain Intervention(s): Monitored during session;Repositioned;Ice applied    Home Living                      Prior Function            PT Goals (current goals can now be found in the care plan section) Progress towards PT goals: Progressing toward goals    Frequency    7X/week      PT Plan Current plan remains appropriate    Co-evaluation              AM-PAC PT "6 Clicks"  Daily Activity  Outcome Measure  Difficulty turning over in bed (including adjusting bedclothes, sheets and blankets)?: A Little Difficulty moving from lying on back to sitting on the side of the bed? : A Little Difficulty sitting down on and standing up from a chair with arms (e.g., wheelchair, bedside commode, etc,.)?: A Little Help needed moving to and from a bed to chair (including a wheelchair)?: A Little Help needed walking in hospital room?: A Little Help needed climbing 3-5 steps with a railing? : A Lot 6 Click Score: 17    End of Session Equipment Utilized During Treatment: Gait belt Activity Tolerance: Patient tolerated treatment well Patient left: in bed;with call bell/phone within reach;with family/visitor present    PT Visit Diagnosis: Pain;Other abnormalities of gait and mobility (R26.89);Difficulty in walking, not elsewhere classified (R26.2) Pain - Right/Left: Right Pain - part of body: Knee     Time: 3825-0539 PT Time Calculation (min) (ACUTE ONLY): 27 min  Charges:  $Gait Training: 8-22 mins $Therapeutic Exercise: 8-22 mins                    G Codes:          Weston Anna, MPT Pager: [phone]

## 2018-02-10 NOTE — Plan of Care (Signed)
  Problem: Nutrition: Goal: Adequate nutrition will be maintained Outcome: Progressing   Problem: Elimination: Goal: Will not experience complications related to bowel motility Outcome: Progressing   Problem: Pain Managment: Goal: General experience of comfort will improve Outcome: Progressing   

## 2018-02-10 NOTE — Progress Notes (Signed)
Physical Therapy Treatment Patient Details Name: Troy [name] MRN: [id number] DOB: 23-Sep-1951 Today's Date: 02/10/2018    History of Present Illness 67 yo male s/p R TKA 02/08/18    PT Comments    Session focused on stair negotiation. Practiced x 2-once with 2 hands on R handrail, once with RW backwards. Pt felt more comfortable using 2 hands on 1 rail and he prefers this technique for entry into home. Pt continues to c/o moderate-severe pain with activity. All education completed. Unsure if pt will actually d/c home on today after speaking with RN. Will continue to follow.     Follow Up Recommendations  Follow surgeon's recommendation for DC plan and follow-up therapies     Equipment Recommendations  None recommended by PT    Recommendations for Other Services       Precautions / Restrictions Precautions Precautions: Fall;Knee Required Braces or Orthoses: Knee Immobilizer - Right Knee Immobilizer - Right: Discontinue once straight leg raise with < 10 degree lag Restrictions Weight Bearing Restrictions: No Other Position/Activity Restrictions: WBAT    Mobility  Bed Mobility Overal bed mobility: Needs Assistance Bed Mobility: Supine to Sit;Sit to Supine     Supine to sit: Min assist Sit to supine: Min assist   General bed mobility comments: small amount of assist for R LE.   Transfers Overall transfer level: Needs assistance Equipment used: Rolling walker (2 wheeled) Transfers: Sit to/from Stand Sit to Stand: Min guard         General transfer comment: close guard for safety. VCS safety, hand/LE placement.   Ambulation/Gait Ambulation/Gait assistance: Min guard Ambulation Distance (Feet): 50 Feet Assistive device: Rolling walker (2 wheeled) Gait Pattern/deviations: Step-to pattern;Antalgic     General Gait Details: close guard for safety. VCs safety, sequence. Slow gait speed.    Stairs Stairs: Yes Min Assist  Stair Management: Step to  pattern;Forwards;One rail Right;Backwards;With walker Number of Stairs: 2 General stair comments: VCs safety, technique, sequence. Practiced x 1 with 2 hands on R handrail. Practiced x 1 with RW backwards. Friend present to observe and assist with stabilizing walker. Pt felt more comfortable using 2 hands on 1 rail  Wheelchair Mobility    Modified Rankin (Stroke Patients Only)       Balance                                            Cognition Arousal/Alertness: Awake/alert Behavior During Therapy: WFL for tasks assessed/performed Overall Cognitive Status: Within Functional Limits for tasks assessed                                        Exercises      General Comments        Pertinent Vitals/Pain Pain Assessment: 0-10 Pain Score: 7  Pain Location: R thigh with activity Pain Descriptors / Indicators: Aching;Sore;Discomfort Pain Intervention(s): Monitored during session;Repositioned;Ice applied    Home Living                      Prior Function            PT Goals (current goals can now be found in the care plan section) Progress towards PT goals: Progressing toward goals    Frequency    7X/week  PT Plan Current plan remains appropriate    Co-evaluation              AM-PAC PT "6 Clicks" Daily Activity  Outcome Measure  Difficulty turning over in bed (including adjusting bedclothes, sheets and blankets)?: A Little Difficulty moving from lying on back to sitting on the side of the bed? : Unable Difficulty sitting down on and standing up from a chair with arms (e.g., wheelchair, bedside commode, etc,.)?: A Little Help needed moving to and from a bed to chair (including a wheelchair)?: A Little Help needed walking in hospital room?: A Little Help needed climbing 3-5 steps with a railing? : A Lot 6 Click Score: 15    End of Session Equipment Utilized During Treatment: Gait belt;Right knee  immobilizer Activity Tolerance: Patient tolerated treatment well Patient left: in bed;with call bell/phone within reach;with family/visitor present   PT Visit Diagnosis: Pain;Other abnormalities of gait and mobility (R26.89);Difficulty in walking, not elsewhere classified (R26.2) Pain - Right/Left: Right Pain - part of body: Knee     Time: 3419-3790 PT Time Calculation (min) (ACUTE ONLY): 17 min  Charges:  $Gait Training: 8-22 mins $Therapeutic Exercise: 8-22 mins                    G Codes:          Weston Anna, MPT Pager: [phone]

## 2018-02-11 LAB — CBC
HEMATOCRIT: 37.2 % — AB (ref 39.0–52.0)
Hemoglobin: 12.3 g/dL — ABNORMAL LOW (ref 13.0–17.0)
MCH: 29.8 pg (ref 26.0–34.0)
MCHC: 33.1 g/dL (ref 30.0–36.0)
MCV: 90.1 fL (ref 78.0–100.0)
Platelets: 148 10*3/uL — ABNORMAL LOW (ref 150–400)
RBC: 4.13 MIL/uL — ABNORMAL LOW (ref 4.22–5.81)
RDW: 13.9 % (ref 11.5–15.5)
WBC: 9.4 10*3/uL (ref 4.0–10.5)

## 2018-02-11 MED ORDER — HYDROCODONE-ACETAMINOPHEN 5-325 MG PO TABS: 1.0000 | ORAL_TABLET | ORAL | 0 refills | Status: DC | PRN

## 2018-02-11 MED ORDER — TAMSULOSIN HCL 0.4 MG PO CAPS: 0.4000 mg | ORAL_CAPSULE | Freq: Every day | ORAL | 0 refills | Status: DC

## 2018-02-11 MED ORDER — METHOCARBAMOL 500 MG PO TABS: 500.0000 mg | ORAL_TABLET | Freq: Four times a day (QID) | ORAL | 0 refills | Status: DC | PRN

## 2018-02-11 MED ORDER — HYDROCODONE-ACETAMINOPHEN 5-325 MG PO TABS
1.0000 | ORAL_TABLET | ORAL | Status: DC | PRN
  Administered 2018-02-11: 2 via ORAL
  Filled 2018-02-11: qty 2

## 2018-02-11 MED ORDER — GABAPENTIN 300 MG PO CAPS: 300.0000 mg | ORAL_CAPSULE | Freq: Three times a day (TID) | ORAL | 0 refills | Status: DC

## 2018-02-11 MED ORDER — TRAMADOL HCL 50 MG PO TABS: 50.0000 mg | ORAL_TABLET | Freq: Four times a day (QID) | ORAL | 0 refills | Status: DC | PRN

## 2018-02-11 NOTE — Progress Notes (Signed)
Physical Therapy Treatment Patient Details Name: Troy [name] MRN: [id number] DOB: July 03, 1951 Today's Date: 02/11/2018    History of Present Illness 67 yo male s/p R TKA 02/08/18    PT Comments    Progressing slowly with mobility. Pt is still experiencing moderate-severe pain in R knee. MD/RN in process of making adjustments to pain meds. Plan is for d/c hopefully later today. Will have a 2nd session prior to d/c.    Follow Up Recommendations  Follow surgeon's recommendation for DC plan and follow-up therapies     Equipment Recommendations  None recommended by PT    Recommendations for Other Services       Precautions / Restrictions Precautions Precautions: Fall;Knee Required Braces or Orthoses: Knee Immobilizer - Right Knee Immobilizer - Right: Discontinue once straight leg raise with < 10 degree lag Restrictions Weight Bearing Restrictions: No Other Position/Activity Restrictions: WBAT    Mobility  Bed Mobility Overal bed mobility: Needs Assistance Bed Mobility: Supine to Sit;Sit to Supine     Supine to sit: Min assist Sit to supine: Min assist   General bed mobility comments: small amount of assist for R LE.   Transfers Overall transfer level: Needs assistance Equipment used: Rolling walker (2 wheeled) Transfers: Sit to/from Stand Sit to Stand: Min guard         General transfer comment: close guard for safety. VCS safety, hand/LE placement.   Ambulation/Gait Ambulation/Gait assistance: Min guard Ambulation Distance (Feet): 50 Feet Assistive device: Rolling walker (2 wheeled) Gait Pattern/deviations: Step-to pattern;Antalgic     General Gait Details: close guard for safety. VCs safety, sequence. Slow gait speed.    Stairs            Wheelchair Mobility    Modified Rankin (Stroke Patients Only)       Balance                                            Cognition Arousal/Alertness: Awake/alert Behavior During Therapy:  WFL for tasks assessed/performed Overall Cognitive Status: Within Functional Limits for tasks assessed                                        Exercises      General Comments        Pertinent Vitals/Pain Pain Assessment: 0-10 Pain Score: 8  Pain Location: R thigh/knee with activity Pain Descriptors / Indicators: Aching;Sore;Grimacing;Discomfort Pain Intervention(s): Monitored during session;Ice applied(RN in to give meds at end of session)    Home Living                      Prior Function            PT Goals (current goals can now be found in the care plan section) Progress towards PT goals: Progressing toward goals    Frequency    7X/week      PT Plan Current plan remains appropriate    Co-evaluation              AM-PAC PT "6 Clicks" Daily Activity  Outcome Measure  Difficulty turning over in bed (including adjusting bedclothes, sheets and blankets)?: A Little Difficulty moving from lying on back to sitting on the side of the bed? : Unable Difficulty sitting down on and  standing up from a chair with arms (e.g., wheelchair, bedside commode, etc,.)?: A Little Help needed moving to and from a bed to chair (including a wheelchair)?: A Little Help needed walking in hospital room?: A Little Help needed climbing 3-5 steps with a railing? : A Lot 6 Click Score: 15    End of Session Equipment Utilized During Treatment: Gait belt;Right knee immobilizer Activity Tolerance: Patient tolerated treatment well Patient left: in bed;with call bell/phone within reach;with family/visitor present   PT Visit Diagnosis: Pain;Other abnormalities of gait and mobility (R26.89);Difficulty in walking, not elsewhere classified (R26.2) Pain - Right/Left: Right Pain - part of body: Knee     Time: 2446-9507 PT Time Calculation (min) (ACUTE ONLY): 14 min  Charges:  $Gait Training: 8-22 mins                    G Codes:          Weston Anna,  MPT Pager: [phone]

## 2018-02-11 NOTE — Care Management Important Message (Signed)
Important Message  Patient Details  Name: Troy [name] MRN: [id number] Date of Birth: Jun 07, 1951   Medicare Important Message Given:  Yes    Kerin Salen 02/11/2018, 12:11 [place] Message  Patient Details  Name: Troy [name] MRN: [id number] Date of Birth: 28-Apr-1951   Medicare Important Message Given:  Yes    Kerin Salen 02/11/2018, 12:11 PM

## 2018-02-11 NOTE — Progress Notes (Addendum)
Subjective: 3 Days Post-Op Procedure(s) (LRB): RIGHT TOTAL KNEE ARTHROPLASTY (Right) Patient reports pain as mild and moderate.   Patient seen in rounds by Dr. Wynelle Link. Patient is well, but has had some minor complaints of pain in the knee, requiring pain medications Patient is ready to go home today. Patient developed some confusion with the narcotics yesterday.  Backed off medications and his cognitive function improved.  Reduced the medications and kept yesterday.  He also required I&O caths and foley was left in place.  He states that he does have a urologist here in town (does not remember name) but has not seen in many years.  Will convert to leg bag and D/C with foley.  Have in follow up next week with Urology.  Objective: Vital signs in last 24 hours: Temp:  [98 F (36.7 C)-100.2 F (37.9 C)] 99.5 F (37.5 C) (04/04 0739) Pulse Rate:  [64-79] 66 (04/04 0739) Resp:  [16-20] 16 (04/04 0739) BP: (121-149)/(68-86) 125/70 (04/04 0739) SpO2:  [94 %-98 %] 95 % (04/04 0739)  Intake/Output from previous day:  Intake/Output Summary (Last 24 hours) at 02/11/2018 0818 Last data filed at 02/11/2018 0651 Gross per 24 hour  Intake 360 ml  Output 2175 ml  Net -1815 ml    Intake/Output this shift: No intake/output data recorded.  Labs: Recent Labs    02/09/18 0535 02/10/18 0548 02/11/18 0539  HGB 13.0 13.5 12.3*   Recent Labs    02/10/18 0548 02/11/18 0539  WBC 11.5* 9.4  RBC 4.62 4.13*  HCT 41.4 37.2*  PLT 182 148*   Recent Labs    02/09/18 0535 02/10/18 0548  NA 139 139  K 4.5 4.2  CL 107 102  CO2 26 29  BUN 18 16  CREATININE 0.85 0.85  GLUCOSE 129* 113*  CALCIUM 8.7* 9.2   No results for input(s): LABPT, INR in the last 72 hours.  EXAM: General - Patient is Alert and Appropriate Extremity - Neurovascular intact Sensation intact distally Intact pulses distally Dorsiflexion/Plantar flexion intact Incision - clean, dry, no drainage Motor Function - intact,  moving foot and toes well on exam.   Assessment/Plan: 3 Days Post-Op Procedure(s) (LRB): RIGHT TOTAL KNEE ARTHROPLASTY (Right) Procedure(s) (LRB): RIGHT TOTAL KNEE ARTHROPLASTY (Right) Past Medical History:  Diagnosis Date  . Arthritis    "knees" (01/02/2017)  . CAD (coronary artery disease)    a. s/p PCI in 2010 in [place]. b. Unstable angina/LHC 04/15/5992 complicated by V fib arrest after contrast injection. Cath 06/19/2015 s/p DES to mid LAD and RPDA. c. 11/2016: chest pain/ abnormal nuclear stress test prompting cath 11/13/16 showing 80% ostial diag, 60% mid RCA, no acute lesions, medical therapy recommended.  . Dyslipidemia   . Essential hypertension   . Lung nodule < 6cm on CT 06/16/15   Right lung  . Mitral regurgitation    a. Mild-mod by echo 06/2015. b. not seen other than trivial in 2018.  . OSA on CPAP   . Persistent atrial fibrillation ([place])    a. 06/2015 noted to be in new a-fib when arrived with unstable angina. Converted to NSR after being shocked in the cath lab for vfib;  b. 06/2015 Eliquis initiated as PAF noted on event monitor. c. Recurrent atrial fib?flutter 10/2016, issues with recurrent AF requiring multiple DCCVs in 02/2017. Failed sotalol, not candidate for Tikosyn due to cost/QTC, Multaq not felt likely strong enough.  . Pulmonary nodule 07/2015   a. 1.5 x 1.2 cm smoothly marginated nodule in  the central aspect of the right lower lobe with recommendation correlation with nonemergent PET-CT to exclude a neoplasm , stable by CT 07/2016  . Severe left ventricular hypertrophy   . Ventricular fibrillation ([place])    a. occured during cath 06/18/2015.   Principal Problem:   OA (osteoarthritis) of knee Urinary Retention  Estimated body mass index is 31.8 kg/m as calculated from the following:   Height as of this encounter: 5\' 11"  (1.803 m).   Weight as of this encounter: 103.4 kg (228 lb). Up with therapy Diet - Cardiac diet Follow up - in 2 weeks with Dr. Wynelle Link Follow  up with Urology next week. Activity - WBAT Disposition - Home Condition Upon Discharge - Stable D/C Meds - See DC Summary DVT Prophylaxis - Xarelto   Arlee Muslim, PA-C Orthopaedic Surgery 02/11/2018, 8:18 AM

## 2018-02-15 DIAGNOSIS — R338 Other retention of urine: Secondary | ICD-10-CM | POA: Diagnosis not present

## 2018-02-23 ENCOUNTER — Other Ambulatory Visit: Payer: Self-pay

## 2018-02-23 MED ORDER — LOSARTAN POTASSIUM 100 MG PO TABS: 100.0000 mg | ORAL_TABLET | Freq: Every day | ORAL | 9 refills | Status: DC

## 2018-02-25 DIAGNOSIS — H35373 Puckering of macula, bilateral: Secondary | ICD-10-CM | POA: Diagnosis not present

## 2018-03-03 DIAGNOSIS — R531 Weakness: Secondary | ICD-10-CM | POA: Diagnosis not present

## 2018-03-03 DIAGNOSIS — R262 Difficulty in walking, not elsewhere classified: Secondary | ICD-10-CM | POA: Diagnosis not present

## 2018-03-03 DIAGNOSIS — M1711 Unilateral primary osteoarthritis, right knee: Secondary | ICD-10-CM | POA: Diagnosis not present

## 2018-03-03 DIAGNOSIS — M25561 Pain in right knee: Secondary | ICD-10-CM | POA: Diagnosis not present

## 2018-03-05 DIAGNOSIS — R531 Weakness: Secondary | ICD-10-CM | POA: Diagnosis not present

## 2018-03-05 DIAGNOSIS — M1711 Unilateral primary osteoarthritis, right knee: Secondary | ICD-10-CM | POA: Diagnosis not present

## 2018-03-05 DIAGNOSIS — R262 Difficulty in walking, not elsewhere classified: Secondary | ICD-10-CM | POA: Diagnosis not present

## 2018-03-05 DIAGNOSIS — M25561 Pain in right knee: Secondary | ICD-10-CM | POA: Diagnosis not present

## 2018-03-08 DIAGNOSIS — M1711 Unilateral primary osteoarthritis, right knee: Secondary | ICD-10-CM | POA: Diagnosis not present

## 2018-03-08 DIAGNOSIS — R262 Difficulty in walking, not elsewhere classified: Secondary | ICD-10-CM | POA: Diagnosis not present

## 2018-03-08 DIAGNOSIS — R531 Weakness: Secondary | ICD-10-CM | POA: Diagnosis not present

## 2018-03-08 DIAGNOSIS — M25561 Pain in right knee: Secondary | ICD-10-CM | POA: Diagnosis not present

## 2018-03-09 ENCOUNTER — Other Ambulatory Visit (HOSPITAL_COMMUNITY): Payer: Self-pay | Admitting: Nurse Practitioner

## 2018-03-10 DIAGNOSIS — M25561 Pain in right knee: Secondary | ICD-10-CM | POA: Diagnosis not present

## 2018-03-10 DIAGNOSIS — M1711 Unilateral primary osteoarthritis, right knee: Secondary | ICD-10-CM | POA: Diagnosis not present

## 2018-03-10 DIAGNOSIS — R531 Weakness: Secondary | ICD-10-CM | POA: Diagnosis not present

## 2018-03-10 DIAGNOSIS — R262 Difficulty in walking, not elsewhere classified: Secondary | ICD-10-CM | POA: Diagnosis not present

## 2018-03-11 NOTE — Telephone Encounter (Signed)
New Message:      *STAT* If patient is at the pharmacy, call can be transferred to refill team.   1. Which medications need to be refilled? (please list name of each medication and dose if known) metoprolol (LOPRESSOR) 100 MG tablet  2. Which pharmacy/location (including street and city if local pharmacy) is medication to be sent to?Take 100 mg by mouth 2 (two) times daily.  3. Do they need a 30 day or 90 day supply? [place]

## 2018-03-12 DIAGNOSIS — Z471 Aftercare following joint replacement surgery: Secondary | ICD-10-CM | POA: Diagnosis not present

## 2018-03-12 DIAGNOSIS — M25561 Pain in right knee: Secondary | ICD-10-CM | POA: Diagnosis not present

## 2018-03-12 DIAGNOSIS — R262 Difficulty in walking, not elsewhere classified: Secondary | ICD-10-CM | POA: Diagnosis not present

## 2018-03-12 DIAGNOSIS — R531 Weakness: Secondary | ICD-10-CM | POA: Diagnosis not present

## 2018-03-12 DIAGNOSIS — Z96651 Presence of right artificial knee joint: Secondary | ICD-10-CM | POA: Diagnosis not present

## 2018-03-12 DIAGNOSIS — M1711 Unilateral primary osteoarthritis, right knee: Secondary | ICD-10-CM | POA: Diagnosis not present

## 2018-03-15 DIAGNOSIS — R262 Difficulty in walking, not elsewhere classified: Secondary | ICD-10-CM | POA: Diagnosis not present

## 2018-03-15 DIAGNOSIS — M1711 Unilateral primary osteoarthritis, right knee: Secondary | ICD-10-CM | POA: Diagnosis not present

## 2018-03-15 DIAGNOSIS — R531 Weakness: Secondary | ICD-10-CM | POA: Diagnosis not present

## 2018-03-15 DIAGNOSIS — M25561 Pain in right knee: Secondary | ICD-10-CM | POA: Diagnosis not present

## 2018-03-16 DIAGNOSIS — R531 Weakness: Secondary | ICD-10-CM | POA: Diagnosis not present

## 2018-03-16 DIAGNOSIS — M1711 Unilateral primary osteoarthritis, right knee: Secondary | ICD-10-CM | POA: Diagnosis not present

## 2018-03-16 DIAGNOSIS — M25561 Pain in right knee: Secondary | ICD-10-CM | POA: Diagnosis not present

## 2018-03-16 DIAGNOSIS — R262 Difficulty in walking, not elsewhere classified: Secondary | ICD-10-CM | POA: Diagnosis not present

## 2018-05-21 ENCOUNTER — Other Ambulatory Visit: Payer: Self-pay

## 2018-05-21 ENCOUNTER — Encounter: Payer: Self-pay | Admitting: Cardiology

## 2018-05-21 ENCOUNTER — Ambulatory Visit (HOSPITAL_COMMUNITY): Payer: Medicare Other | Attending: Cardiology

## 2018-05-21 DIAGNOSIS — I4891 Unspecified atrial fibrillation: Secondary | ICD-10-CM | POA: Diagnosis not present

## 2018-05-21 DIAGNOSIS — I719 Aortic aneurysm of unspecified site, without rupture: Secondary | ICD-10-CM | POA: Diagnosis not present

## 2018-05-21 DIAGNOSIS — I2510 Atherosclerotic heart disease of native coronary artery without angina pectoris: Secondary | ICD-10-CM | POA: Diagnosis not present

## 2018-05-21 DIAGNOSIS — I340 Nonrheumatic mitral (valve) insufficiency: Secondary | ICD-10-CM | POA: Diagnosis not present

## 2018-05-21 DIAGNOSIS — Q210 Ventricular septal defect: Secondary | ICD-10-CM | POA: Insufficient documentation

## 2018-05-21 DIAGNOSIS — E785 Hyperlipidemia, unspecified: Secondary | ICD-10-CM | POA: Insufficient documentation

## 2018-05-21 DIAGNOSIS — I77810 Thoracic aortic ectasia: Secondary | ICD-10-CM | POA: Insufficient documentation

## 2018-05-25 ENCOUNTER — Other Ambulatory Visit: Payer: Self-pay

## 2018-05-25 ENCOUNTER — Telehealth: Payer: Self-pay

## 2018-05-25 DIAGNOSIS — I77810 Thoracic aortic ectasia: Secondary | ICD-10-CM

## 2018-05-25 NOTE — Telephone Encounter (Signed)
Notes recorded by Frederik Schmidt, RN on 05/25/2018 at 4:50 PM EDT Spoke with patient and discussed Cardiac MRI. He verbalized understanding and I informed him that we would call him to schedule. ------

## 2018-05-25 NOTE — Progress Notes (Unsigned)
Spoke with patient and discussed the Cardiac MRI.  I informed him that we would call to schedule and he verbalized understanding.

## 2018-06-09 ENCOUNTER — Other Ambulatory Visit (HOSPITAL_COMMUNITY): Payer: Medicare Other

## 2018-06-16 ENCOUNTER — Encounter: Payer: Self-pay | Admitting: Cardiology

## 2018-06-28 ENCOUNTER — Telehealth: Payer: Self-pay

## 2018-06-28 DIAGNOSIS — Z01812 Encounter for preprocedural laboratory examination: Secondary | ICD-10-CM

## 2018-06-28 DIAGNOSIS — I77810 Thoracic aortic ectasia: Secondary | ICD-10-CM

## 2018-06-28 NOTE — Telephone Encounter (Signed)
I will forward this request to Dr. Radford Pax, who is the patient's primary cardiologist and the ordering physician.  Thanks.  Nelva Bush, MD [facility] Pager: [phone]

## 2018-06-28 NOTE — Telephone Encounter (Signed)
Spoke to the patient who is having a Cardiac MRI on 8/21.  He said that he does not do well and that his Ativan will not ease his nerves.  He has had medications such as Valium and Xanax without success.  Please advise, thank you.

## 2018-06-28 NOTE — Telephone Encounter (Signed)
Change to CHest CT angio instead

## 2018-06-28 NOTE — Telephone Encounter (Signed)
Sent to Dr Radford Pax

## 2018-06-29 ENCOUNTER — Telehealth: Payer: Self-pay

## 2018-06-29 NOTE — Telephone Encounter (Signed)
Allergies as of 06/29/2018  Review status set to Review Complete by Wynonia Musty, RN on 02/09/2018   Severity Noted Reaction Type Reactions  Contrast Media [iodinated Diagnostic Agents] High 11/13/2016  Allergy Other (See Comments)  V-Fib arrest during cardiac cath suspected d/t contrast dye in 2016  Gluten Meal High 06/16/2015  Allergy Other (See Comments)  REACTION: Celiac disease (severe stomach pain)  Metrizamide High 11/13/2016   Other (See Comments)  V-Fib arrest during cardiac cath suspected d/t contrast dye in 2016  Tizanidine High 06/16/2015  Allergy Anxiety  Depression  [place] High 06/16/2015  Allergy Other (See Comments)  REACTION: Celiac disease (severe stomach pain)  Morphine And Related Medium 06/28/2015  Allergy Other (See Comments)  REACTION: "REALLY BAD STOMACH PAIN"  Apple Low 02/09/2018  Allergy Diarrhea   Dr Radford Pax, as listed above, patient has major contrast allergy with noted V-fib arrest.  Patient cannot do MRI due to extreme claustrophobia with known sedative use. Please advise/clarify on what imaging you would like. He has had this scheduled numerous times, but they have not used contrast.  Stacy from CT said that due to Vfib, they cannot do it in outpatient setting.  Please advise, thank you.

## 2018-06-29 NOTE — Telephone Encounter (Signed)
lpmtcb 8/20

## 2018-06-29 NOTE — Telephone Encounter (Signed)
Sent to Dr Radford Pax for recommendations

## 2018-06-29 NOTE — Telephone Encounter (Signed)
Spoke to patient about the recommendation per Dr Radford Pax.  We will schedule a Chest CT in place of the MRI.  He verbalized understanding and will await our call to schedule and come in for labs 8/21.

## 2018-06-29 NOTE — Addendum Note (Signed)
Addended by: Frederik Schmidt on: 06/29/2018 08:54 AM   Modules accepted: Orders

## 2018-06-30 ENCOUNTER — Ambulatory Visit (HOSPITAL_COMMUNITY): Admission: RE | Admit: 2018-06-30 | Payer: Medicare Other | Source: Ambulatory Visit

## 2018-06-30 ENCOUNTER — Other Ambulatory Visit: Payer: Medicare Other

## 2018-07-06 ENCOUNTER — Other Ambulatory Visit: Payer: Self-pay

## 2018-07-06 DIAGNOSIS — I77810 Thoracic aortic ectasia: Secondary | ICD-10-CM

## 2018-07-06 NOTE — Telephone Encounter (Signed)
Informed patient of Dr Theodosia Blender recommendation.  He verbalized understanding and will await our call to schedule.

## 2018-07-06 NOTE — Telephone Encounter (Signed)
Go ahead and change to noncontrast Chest CT - they should still be able to get a ballpark measurment of aorta.  Non contrast CT in 2017 commented on aorta at that time

## 2018-07-09 ENCOUNTER — Other Ambulatory Visit: Payer: Self-pay | Admitting: Cardiology

## 2018-07-20 ENCOUNTER — Encounter: Payer: Self-pay | Admitting: Cardiology

## 2018-07-20 ENCOUNTER — Telehealth: Payer: Self-pay

## 2018-07-20 ENCOUNTER — Ambulatory Visit (INDEPENDENT_AMBULATORY_CARE_PROVIDER_SITE_OTHER)
Admission: RE | Admit: 2018-07-20 | Discharge: 2018-07-20 | Disposition: A | Discharge status: Home / Self Care | Payer: Medicare Other | Source: Ambulatory Visit | Attending: Cardiology | Admitting: Cardiology

## 2018-07-20 DIAGNOSIS — I719 Aortic aneurysm of unspecified site, without rupture: Secondary | ICD-10-CM

## 2018-07-20 DIAGNOSIS — I712 Thoracic aortic aneurysm, without rupture: Secondary | ICD-10-CM | POA: Diagnosis not present

## 2018-07-20 DIAGNOSIS — I77810 Thoracic aortic ectasia: Secondary | ICD-10-CM

## 2018-07-20 IMAGING — CT CT CHEST W/O CM
2 of 3 series · 15 of 36 positions shown, 18 images · non-contrast
Comparison: [DATE]

CLINICAL DATA: Followup ascending thoracic aortic aneurysm.

EXAM:
CT CHEST WITHOUT CONTRAST
TECHNIQUE: Multidetector CT imaging of the chest was performed following the
standard protocol without IV contrast.

[Series 2: thorax · axial · 0.88mm/px · z∈[-352,-60]mm · 12 of 172 slices shown, 15 images]
[im 13/172  mediastinal]
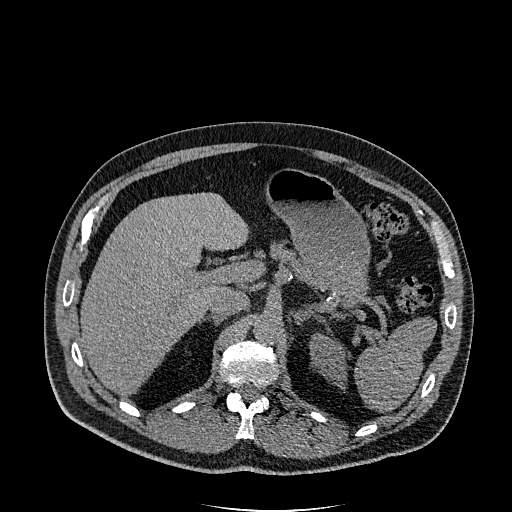
[im 13/172  lung]
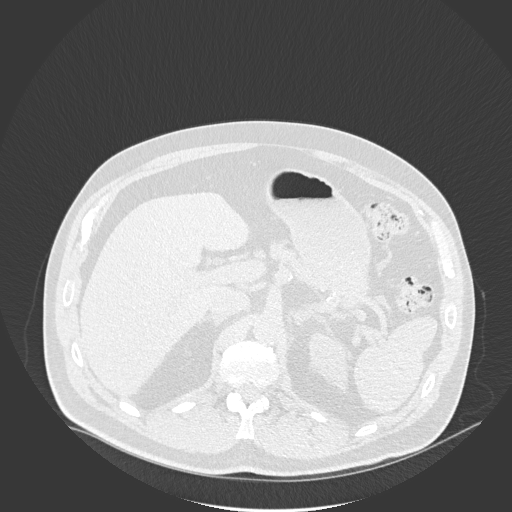
[im 26/172  lung]
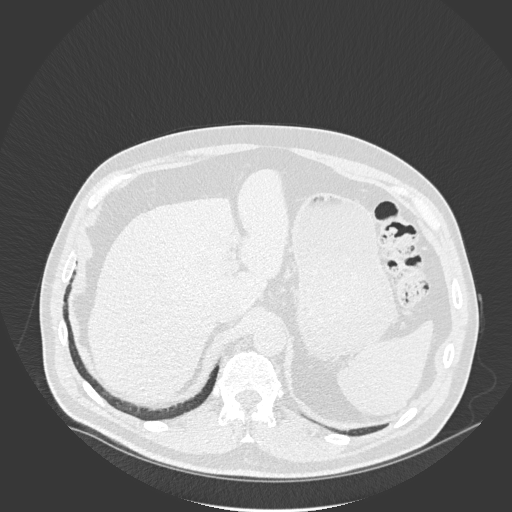
[im 39/172  lung]
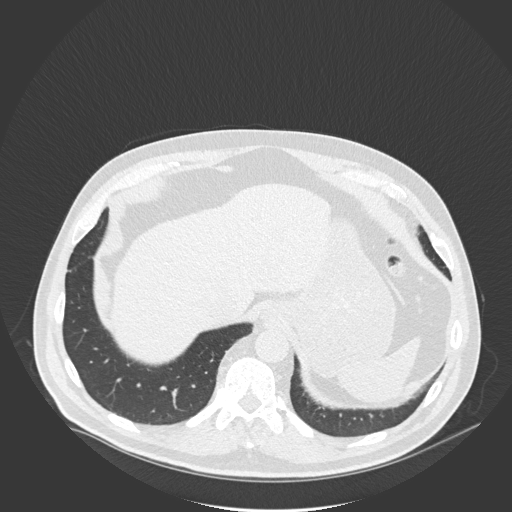
[im 51/172  lung]
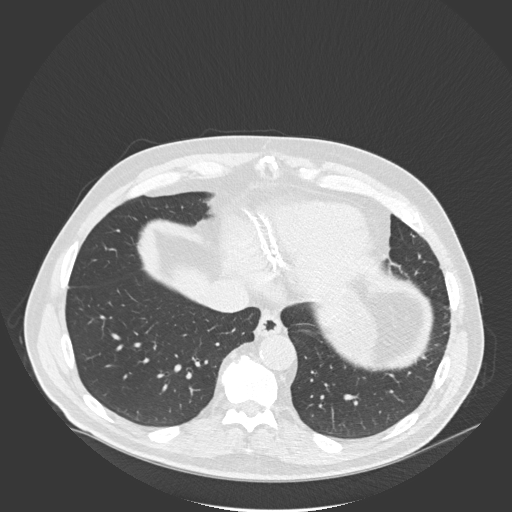
[im 64/172  mediastinal]
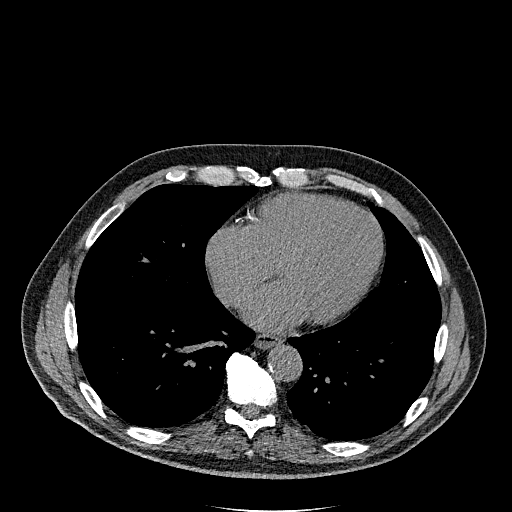
[im 64/172  lung]
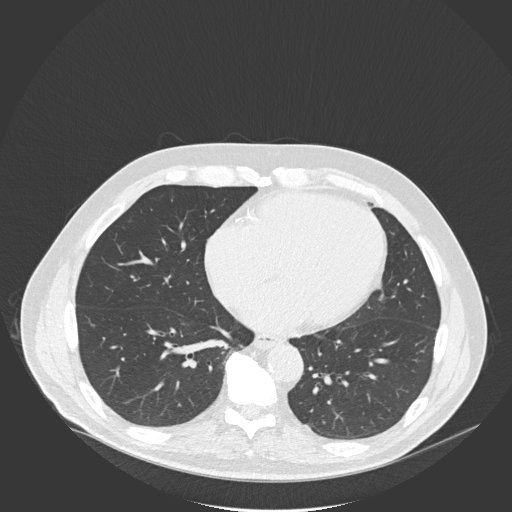
[im 77/172  lung]
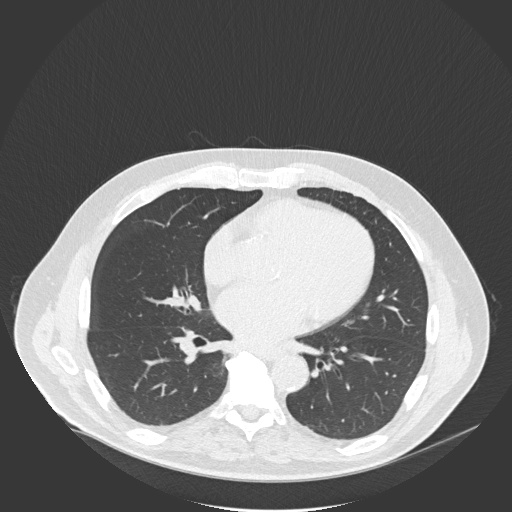
[im 96/172  lung]
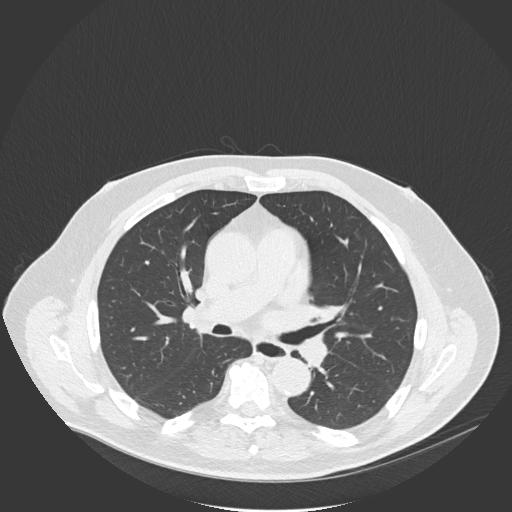
[im 108/172  lung]
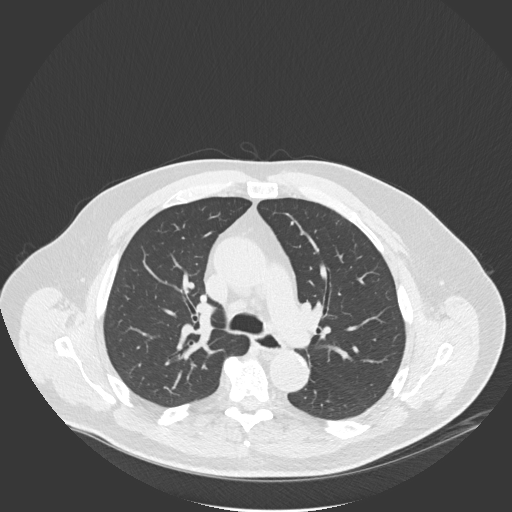
[im 121/172  mediastinal]
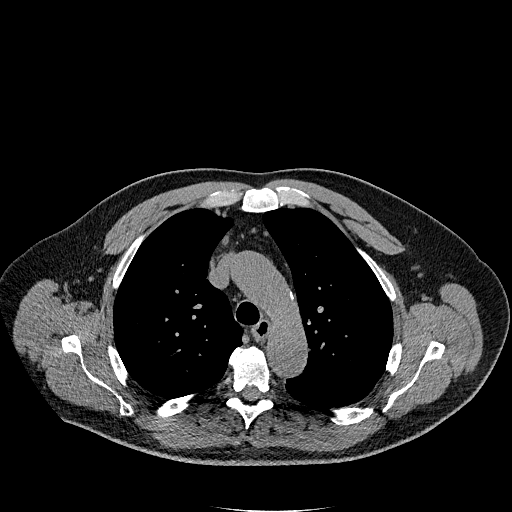
[im 121/172  lung]
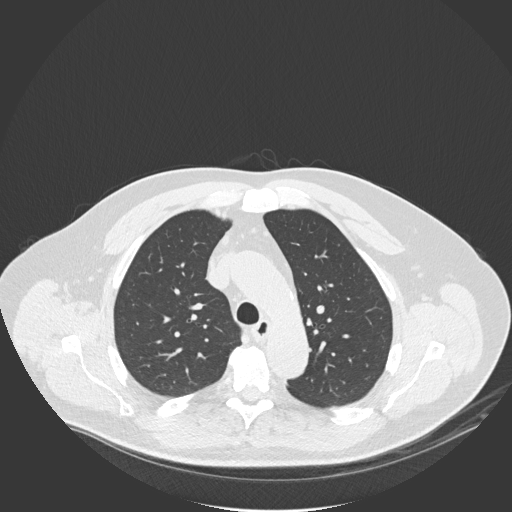
[im 134/172  lung]
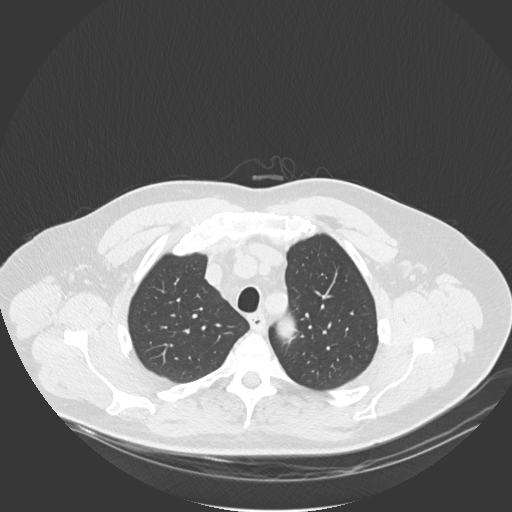
[im 146/172  lung]
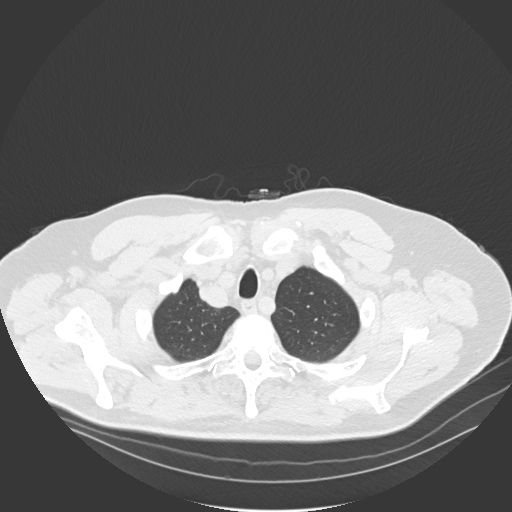
[im 159/172  lung]
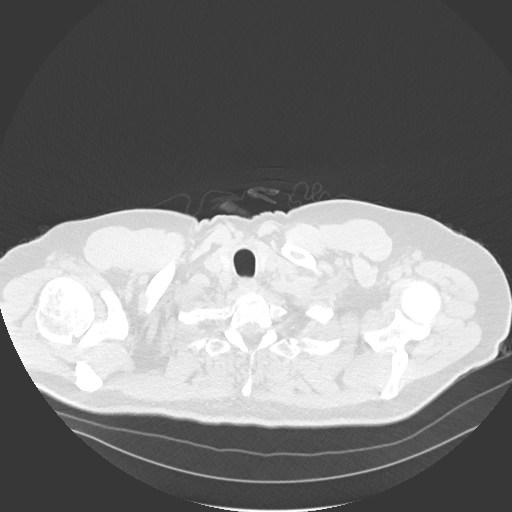

[Series 5: coronal · coronal · 0.67mm/px · 3 of 146 slices shown]
[im 30/146  lung]
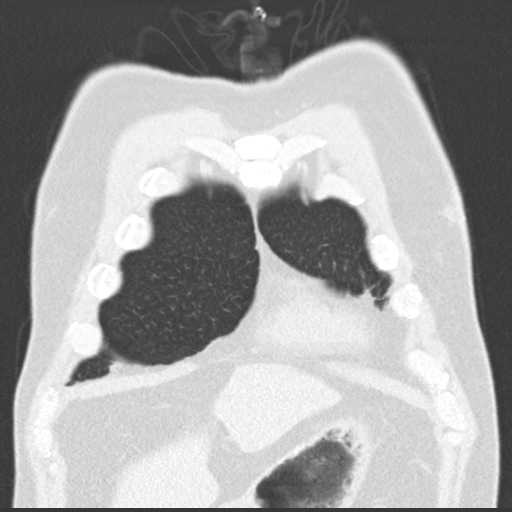
[im 59/146  lung]
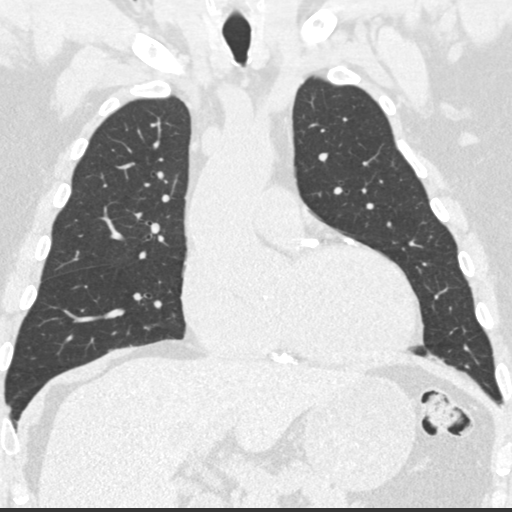
[im 88/146  lung]
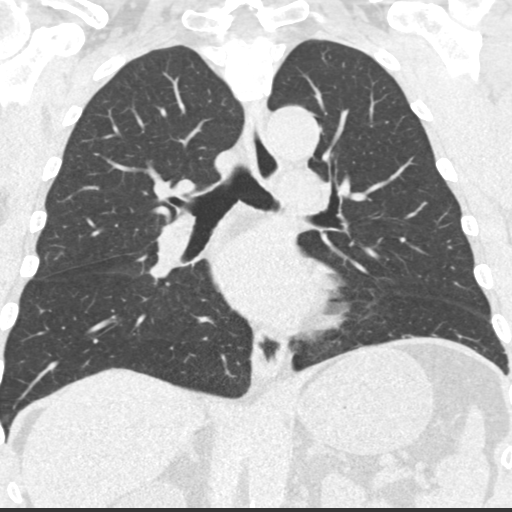

[15 of 36 positions shown; findings below may reference images not displayed]

FINDINGS: Cardiovascular: The ascending thoracic aorta measures 4.2 cm in
diameter. Stable from previous exam. The anterior arch measures
cm and the posterior arch measures 3.4 cm. At the level of the
hiatus the descending thoracic aorta measures 3 cm. Aortic
atherosclerosis noted. Calcifications identified within the left
main coronary artery, left circumflex, lad and RCA. No pericardial
effusion.

Mediastinum/Nodes: Normal appearance of the thyroid gland. The
trachea appears patent and is midline. Normal appearance of the
esophagus. No mediastinal, axillary or supraclavicular adenopathy.

Lungs/Pleura: No pleural effusion, airspace consolidation or
atelectasis. Perifissural nodule within the central right lung is
stable measuring 1.2 cm compatible with a benign abnormality.

Upper Abdomen: No acute abnormality.

Musculoskeletal: No chest wall mass or suspicious bone lesions
identified.
IMPRESSION: 1. Stable ascending thoracic aortic aneurysm measuring 4.2 cm in
diameter. Recommend annual imaging followup by CTA or MRA. This
recommendation follows [3U]
ACCF/AHA/AATS/ACR/ASA/SCA/HUNGBO/HUNGBO/HUNGBO/HUNGBO Guidelines for the
Diagnosis and Management of Patients with Thoracic Aortic Disease.
Circulation. [3U]; 121: [id number]
2.  Aortic Atherosclerosis ([3U]-[3U]).
3. Multi vessel coronary artery atherosclerotic calcifications
including left main disease.

## 2018-07-20 NOTE — Telephone Encounter (Signed)
Attempted to call patient. It was very loud and he requested results released to MyChart for him to review. Message released: "Per Dr. Radford Pax, your chest CT showed stable aneurysm. There are no new recommendations right now, but we will do another ultrasound of your heart in 1 year to reassess." Repeat echo ordered for scheduling in 1 year.

## 2018-07-20 NOTE — Telephone Encounter (Signed)
-----   Message from Sueanne Margarita, MD sent at 07/20/2018  1:57 PM EDT ----- Chest CT showed stable ascending thoracic aortic aneurysm at 4.2cm (4.4cm by echo).  Repeat echo in 1 year for followup

## 2018-08-14 ENCOUNTER — Other Ambulatory Visit: Payer: Self-pay | Admitting: Nurse Practitioner

## 2018-10-05 DIAGNOSIS — Z125 Encounter for screening for malignant neoplasm of prostate: Secondary | ICD-10-CM | POA: Diagnosis not present

## 2018-10-05 DIAGNOSIS — R7309 Other abnormal glucose: Secondary | ICD-10-CM | POA: Diagnosis not present

## 2018-10-05 DIAGNOSIS — I10 Essential (primary) hypertension: Secondary | ICD-10-CM | POA: Diagnosis not present

## 2018-10-05 DIAGNOSIS — R82998 Other abnormal findings in urine: Secondary | ICD-10-CM | POA: Diagnosis not present

## 2018-10-05 DIAGNOSIS — E7849 Other hyperlipidemia: Secondary | ICD-10-CM | POA: Diagnosis not present

## 2018-10-13 DIAGNOSIS — E7849 Other hyperlipidemia: Secondary | ICD-10-CM | POA: Diagnosis not present

## 2018-10-15 DIAGNOSIS — Z6833 Body mass index (BMI) 33.0-33.9, adult: Secondary | ICD-10-CM | POA: Diagnosis not present

## 2018-10-15 DIAGNOSIS — F418 Other specified anxiety disorders: Secondary | ICD-10-CM | POA: Diagnosis not present

## 2018-10-15 DIAGNOSIS — I4891 Unspecified atrial fibrillation: Secondary | ICD-10-CM | POA: Diagnosis not present

## 2018-10-15 DIAGNOSIS — E7849 Other hyperlipidemia: Secondary | ICD-10-CM | POA: Diagnosis not present

## 2018-10-15 DIAGNOSIS — Z0000 Encounter for general adult medical examination without abnormal findings: Secondary | ICD-10-CM | POA: Diagnosis not present

## 2018-10-15 DIAGNOSIS — I10 Essential (primary) hypertension: Secondary | ICD-10-CM | POA: Diagnosis not present

## 2018-10-15 DIAGNOSIS — I25118 Atherosclerotic heart disease of native coronary artery with other forms of angina pectoris: Secondary | ICD-10-CM | POA: Diagnosis not present

## 2018-10-15 DIAGNOSIS — R918 Other nonspecific abnormal finding of lung field: Secondary | ICD-10-CM | POA: Diagnosis not present

## 2018-10-15 DIAGNOSIS — Z1389 Encounter for screening for other disorder: Secondary | ICD-10-CM | POA: Diagnosis not present

## 2018-10-15 DIAGNOSIS — I712 Thoracic aortic aneurysm, without rupture: Secondary | ICD-10-CM | POA: Diagnosis not present

## 2018-11-15 ENCOUNTER — Other Ambulatory Visit: Payer: Self-pay | Admitting: Cardiology

## 2018-12-17 DIAGNOSIS — H43811 Vitreous degeneration, right eye: Secondary | ICD-10-CM | POA: Diagnosis not present

## 2018-12-17 DIAGNOSIS — H35372 Puckering of macula, left eye: Secondary | ICD-10-CM | POA: Diagnosis not present

## 2018-12-17 DIAGNOSIS — H52203 Unspecified astigmatism, bilateral: Secondary | ICD-10-CM | POA: Diagnosis not present

## 2018-12-17 DIAGNOSIS — H0100A Unspecified blepharitis right eye, upper and lower eyelids: Secondary | ICD-10-CM | POA: Diagnosis not present

## 2019-01-12 ENCOUNTER — Other Ambulatory Visit (HOSPITAL_COMMUNITY): Payer: Self-pay | Admitting: Nurse Practitioner

## 2019-01-12 ENCOUNTER — Other Ambulatory Visit: Payer: Self-pay | Admitting: Cardiology

## 2019-02-07 ENCOUNTER — Other Ambulatory Visit: Payer: Self-pay | Admitting: Cardiology

## 2019-02-07 ENCOUNTER — Other Ambulatory Visit (HOSPITAL_COMMUNITY): Payer: Self-pay | Admitting: Nurse Practitioner

## 2019-03-22 DIAGNOSIS — R0600 Dyspnea, unspecified: Secondary | ICD-10-CM | POA: Diagnosis not present

## 2019-03-22 DIAGNOSIS — F419 Anxiety disorder, unspecified: Secondary | ICD-10-CM | POA: Diagnosis not present

## 2019-03-23 DIAGNOSIS — Z20818 Contact with and (suspected) exposure to other bacterial communicable diseases: Secondary | ICD-10-CM | POA: Diagnosis not present

## 2019-03-29 ENCOUNTER — Other Ambulatory Visit: Payer: Self-pay | Admitting: Cardiology

## 2019-03-29 ENCOUNTER — Other Ambulatory Visit: Payer: Self-pay

## 2019-03-29 MED ORDER — SPIRONOLACTONE 25 MG PO TABS: 25.0000 mg | ORAL_TABLET | Freq: Every day | ORAL | 0 refills | Status: DC

## 2019-04-22 ENCOUNTER — Other Ambulatory Visit: Payer: Self-pay | Admitting: Cardiology

## 2019-04-26 ENCOUNTER — Telehealth: Payer: Self-pay | Admitting: *Deleted

## 2019-04-26 NOTE — Telephone Encounter (Signed)

## 2019-04-27 ENCOUNTER — Other Ambulatory Visit: Payer: Self-pay | Admitting: Cardiology

## 2019-05-03 ENCOUNTER — Telehealth (INDEPENDENT_AMBULATORY_CARE_PROVIDER_SITE_OTHER): Payer: Medicare Other | Admitting: Cardiology

## 2019-05-03 ENCOUNTER — Other Ambulatory Visit: Payer: Self-pay

## 2019-05-03 DIAGNOSIS — I4819 Other persistent atrial fibrillation: Secondary | ICD-10-CM | POA: Diagnosis not present

## 2019-05-03 NOTE — Progress Notes (Signed)
Electrophysiology TeleHealth Note   Due to national recommendations of social distancing due to COVID 19, an audio/video telehealth visit is felt to be most appropriate for this patient at this time.  See Epic message for the patient's consent to telehealth for [facility].   Date:  05/03/2019   ID:  Troy [name], DOB 1951-07-27, MRN [id number]  Location: patient's home  Provider location: [address] [place]  Evaluation Performed: Follow-up visit  PCP:  Velna Hatchet, MD  Cardiologist:  Fransico Him, MD  Electrophysiologist:  Dr Curt Bears  Chief Complaint: Atrial fibrillation  History of Present Illness:    Troy [name] is a 68 y.o. male who presents via audio/video conferencing for a telehealth visit today.  Since last being seen in our clinic, the patient reports doing very well.  Today, he denies symptoms of palpitations, chest pain, shortness of breath,  lower extremity edema, dizziness, presyncope, or syncope.  The patient is otherwise without complaint today.  The patient denies symptoms of fevers, chills, cough, or new SOB worrisome for COVID 19.  He has a history significant for coronary artery disease status post DES to the LAD and PCA with VF arrest.  He had PCI as well in [place] in 2010, hypertension, hyperlipidemia, MR, paroxysmal AF.    Today, denies symptoms of palpitations, chest pain, shortness of breath, orthopnea, PND, lower extremity edema, claudication, dizziness, presyncope, syncope, bleeding, or neurologic sequela. The patient is tolerating medications without difficulties.  Currently he is feeling well.  He has no complaints of chest pain or shortness of breath.  He has noted no symptoms from his atrial fibrillation.  He does say that usually during the day his heart rate is in the 70s, but at night when he is sleeping, he notices that his heart rates do jump up into the 120s at times.  He is not sleeping when this occurs.  Past  Medical History:  Diagnosis Date  . Arthritis    "knees" (01/02/2017)  . Ascending aorta dilatation (HCC)    9mm by echo 05/2018, 46mm by CT 2017 and 84mm by CT 2019  . CAD (coronary artery disease)    a. s/p PCI in 2010 in [place]. b. Unstable angina/LHC 07/12/5055 complicated by V fib arrest after contrast injection. Cath 06/19/2015 s/p DES to mid LAD and RPDA. c. 11/2016: chest pain/ abnormal nuclear stress test prompting cath 11/13/16 showing 80% ostial diag, 60% mid RCA, no acute lesions, medical therapy recommended.  . Dyslipidemia   . Essential hypertension   . Lung nodule < 6cm on CT 06/16/15   Right lung  . Mitral regurgitation    a. Mild-mod by echo 06/2015. b. not seen other than trivial in 2018.  . OSA on CPAP   . Persistent atrial fibrillation ([place])    a. 06/2015 noted to be in new a-fib when arrived with unstable angina. Converted to NSR after being shocked in the cath lab for vfib;  b. 06/2015 Eliquis initiated as PAF noted on event monitor. c. Recurrent atrial fib?flutter 10/2016, issues with recurrent AF requiring multiple DCCVs in 02/2017. Failed sotalol, not candidate for Tikosyn due to cost/QTC, Multaq not felt likely strong enough.  . Pulmonary nodule 07/2015   a. 1.5 x 1.2 cm smoothly marginated nodule in the central aspect of the right lower lobe with recommendation correlation with nonemergent PET-CT to exclude a neoplasm , stable by CT 07/2016  . Severe left ventricular hypertrophy   . Ventricular fibrillation ([place])  a. occured during cath 06/18/2015.    Past Surgical History:  Procedure Laterality Date  . CARDIAC CATHETERIZATION N/A 06/18/2015   Procedure: Left Heart Cath and Coronary Angiography;  Surgeon: Burnell Blanks, MD;  Location: [place] [facility];  Service: Cardiovascular;  Laterality: N/A;  . CARDIAC CATHETERIZATION N/A 06/19/2015   Procedure: Coronary Stent Intervention;  Surgeon: Peter M Martinique, MD;  Location: [place] [facility];  Service: Cardiovascular;   Laterality: N/A;  . CARDIAC CATHETERIZATION N/A 11/13/2016   Procedure: Left Heart Cath and Coronary Angiography;  Surgeon: Lorretta Harp, MD;  Location: [place] [facility];  Service: Cardiovascular;  Laterality: N/A;  . CARDIOVERSION N/A 11/17/2016   Procedure: CARDIOVERSION;  Surgeon: Larey Dresser, MD;  Location: [facility];  Service: Cardiovascular;  Laterality: N/A;  . CARDIOVERSION N/A 01/03/2017   Procedure: CARDIOVERSION;  Surgeon: Lelon Perla, MD;  Location: [place];  Service: Cardiovascular;  Laterality: N/A;  . CARDIOVERSION N/A 02/05/2017   Procedure: Cardioversion;  Surgeon: Evans Lance, MD;  Location: [place] [facility];  Service: Cardiovascular;  Laterality: N/A;  . CARDIOVERSION N/A 02/20/2017   Procedure: Cardioversion;  Surgeon: Evans Lance, MD;  Location: [place] [facility];  Service: Cardiovascular;  Laterality: N/A;  . CARTILAGE SURGERY Bilateral    "thumbs"  . CATARACT EXTRACTION W/ INTRAOCULAR LENS  IMPLANT, BILATERAL Bilateral   . CORONARY ANGIOPLASTY WITH STENT PLACEMENT  ~ 2007  . JOINT REPLACEMENT    . KNEE ARTHROSCOPY Right   . RETINAL DETACHMENT SURGERY Bilateral    "laser thing"  . TEE WITHOUT CARDIOVERSION N/A 11/17/2016   Procedure: TRANSESOPHAGEAL ECHOCARDIOGRAM (TEE);  Surgeon: Larey Dresser, MD;  Location: [place];  Service: Cardiovascular;  Laterality: N/A;  . TOTAL KNEE ARTHROPLASTY Left 2000s  . TOTAL KNEE ARTHROPLASTY Right 02/08/2018   Procedure: RIGHT TOTAL KNEE ARTHROPLASTY;  Surgeon: Gaynelle Arabian, MD;  Location: [facility];  Service: Orthopedics;  Laterality: Right;  with block    Current Outpatient Medications  Medication Sig Dispense Refill  . ezetimibe (ZETIA) 10 MG tablet TAKE 1 TABLET(10 MG) BY MOUTH DAILY 90 tablet 3  . gabapentin (NEURONTIN) 300 MG capsule Take 1 capsule (300 mg total) by mouth 3 (three) times daily. Gabapentin 300 mg Protocol Take a 300 mg capsule three times a day for two weeks, Then a 300 mg capsule twice a  day for two weeks, Then a 300 mg capsule once a day for two weeks, then discontinue the Gabapentin. 84 capsule 0  . HYDROcodone-acetaminophen (NORCO/VICODIN) 5-325 MG tablet Take 1-2 tablets by mouth every 4 (four) hours as needed for moderate pain or severe pain. 50 tablet 0  . LORazepam (ATIVAN) 0.5 MG tablet Take 0.5 mg by mouth daily as needed for anxiety.    Marland Kitchen losartan (COZAAR) 100 MG tablet Take 1 tablet (100 mg total) by mouth daily. 90 tablet 0  . methocarbamol (ROBAXIN) 500 MG tablet Take 1 tablet (500 mg total) by mouth every 6 (six) hours as needed for muscle spasms. 80 tablet 0  . metoprolol tartrate (LOPRESSOR) 100 MG tablet TAKE 1 TABLET(100 MG) BY MOUTH TWICE DAILY 180 tablet 1  . nitroGLYCERIN (NITROSTAT) 0.3 MG SL tablet Place 1 tablet (0.3 mg total) under the tongue every 5 (five) minutes as needed for chest pain. 90 tablet 6  . oxymetazoline (AFRIN) 0.05 % nasal spray Place 1 spray into both nostrils 2 (two) times daily as needed (FOR SINUS CONGESTION.). 30 mL 0  . spironolactone (  ALDACTONE) 25 MG tablet TAKE 1 TABLET(25 MG) BY MOUTH DAILY 15 tablet 0  . tamsulosin (FLOMAX) 0.4 MG CAPS capsule Take 1 capsule (0.4 mg total) by mouth daily after supper. 14 capsule 0  . traMADol (ULTRAM) 50 MG tablet Take 1-2 tablets (50-100 mg total) by mouth every 6 (six) hours as needed for moderate pain (not relieved by oxycodone). 56 tablet 0  . XARELTO 20 MG TABS tablet TAKE 1 TABLET(20 MG) BY MOUTH DAILY WITH SUPPER 30 tablet 5  . atorvastatin (LIPITOR) 80 MG tablet Take 1 tablet (80 mg total) by mouth daily. 90 tablet 3   No current facility-administered medications for this visit.     Allergies:   Contrast media [iodinated diagnostic agents], Gluten meal, Metrizamide, Tizanidine, Whey, Morphine and related, and Apple   Social History:  The patient  reports that he has never smoked. He has never used smokeless tobacco. He reports current alcohol use of about 2.0 standard drinks of alcohol  per week. He reports that he does not use drugs.   Family History:  The patient's  family history includes Diabetes in his sister; Drug abuse in his father; Emphysema in his mother; Heart attack in his father, maternal grandfather, and paternal grandfather; Lung cancer in his mother; Stroke in his mother.   ROS:  Please see the history of present illness.   All other systems are personally reviewed and negative.    Exam:    Vital Signs:  BP 140/70   Pulse 73   Well appearing, alert and conversant, regular work of breathing,  good skin color Eyes- anicteric, neuro- grossly intact, skin- no apparent rash or lesions or cyanosis, mouth- oral mucosa is pink   Labs/Other Tests and Data Reviewed:    Recent Labs: No results found for requested labs within last 8760 hours.   Wt Readings from Last 3 Encounters:  02/08/18 228 lb (103.4 kg)  02/02/18 228 lb 2 oz (103.5 kg)  12/22/17 231 lb (104.8 kg)     Other studies personally reviewed: Additional studies/ records that were reviewed today include: ECG 11/16/2017 personally reviewed Review of the above records today demonstrates: Sinus rhythm   ASSESSMENT & PLAN:    1.  Persistent atrial fibrillation: Currently on Xarelto.  He would prefer to have ablation if he goes back into atrial fibrillation.  It is unclear to me whether or not he is having more episodes of atrial fibrillation.  He says that his Apple Watch told him that his heart rate is usually regular except for the episodes at night.  I Jetty Berland see him back in 3 months to get a further evaluation of his symptoms with an ECG.  This patients CHA2DS2-VASc Score and unadjusted Ischemic Stroke Rate (% per year) is equal to 3.2 % stroke rate/year from a score of 3  Above score calculated as 1 point each if present [CHF, HTN, DM, Vascular=MI/PAD/Aortic Plaque, Age if 65-74, or Male] Above score calculated as 2 points each if present [Age > 75, or Stroke/TIA/TE]  2.  Hypertension: Blood  pressure is mildly elevated today, but he thinks it is due to his lack of exercise due to coronavirus issues.  He does need a refill of his spironolactone and would like a 90-day supply.  We Jessee Newnam make no other changes.  3.  Coronary artery disease: No current chest pain.  No current chest pain  4.  Obstructive sleep apnea: CPAP compliance encouraged   COVID 19 screen The patient denies symptoms  of COVID 19 at this time.  The importance of social distancing was discussed today.  Follow-up: 3 months  Current medicines are reviewed at length with the patient today.   The patient does not have concerns regarding his medicines.  The following changes were made today:  none  Labs/ tests ordered today include:  No orders of the defined types were placed in this encounter.    Patient Risk:  after full review of this patients clinical status, I feel that they are at moderate risk at this time.  Today, I have spent 5 minutes with the patient with telehealth technology discussing atrial fibrillation, hypertension.    Signed, Naod Sweetland Meredith Leeds, MD  05/03/2019 11:50 AM     [address] [place] [place] [address] [phone] (office) [phone] (fax)

## 2019-05-15 ENCOUNTER — Other Ambulatory Visit: Payer: Self-pay | Admitting: Cardiology

## 2019-05-16 DIAGNOSIS — I10 Essential (primary) hypertension: Secondary | ICD-10-CM | POA: Diagnosis not present

## 2019-05-16 DIAGNOSIS — R918 Other nonspecific abnormal finding of lung field: Secondary | ICD-10-CM | POA: Diagnosis not present

## 2019-05-16 DIAGNOSIS — R590 Localized enlarged lymph nodes: Secondary | ICD-10-CM | POA: Diagnosis not present

## 2019-05-17 ENCOUNTER — Other Ambulatory Visit: Payer: Self-pay | Admitting: Internal Medicine

## 2019-05-17 ENCOUNTER — Other Ambulatory Visit (HOSPITAL_COMMUNITY): Payer: Self-pay | Admitting: *Deleted

## 2019-05-17 DIAGNOSIS — R590 Localized enlarged lymph nodes: Secondary | ICD-10-CM

## 2019-05-17 MED ORDER — METOPROLOL TARTRATE 100 MG PO TABS: ORAL_TABLET | ORAL | 2 refills | Status: DC

## 2019-05-19 ENCOUNTER — Ambulatory Visit
Admission: RE | Admit: 2019-05-19 | Discharge: 2019-05-19 | Disposition: A | Discharge status: Home / Self Care | Payer: Medicare Other | Source: Ambulatory Visit | Attending: Internal Medicine | Admitting: Internal Medicine

## 2019-05-19 DIAGNOSIS — R9389 Abnormal findings on diagnostic imaging of other specified body structures: Secondary | ICD-10-CM | POA: Diagnosis not present

## 2019-05-19 DIAGNOSIS — R590 Localized enlarged lymph nodes: Secondary | ICD-10-CM

## 2019-07-13 DIAGNOSIS — M19072 Primary osteoarthritis, left ankle and foot: Secondary | ICD-10-CM | POA: Diagnosis not present

## 2019-07-13 DIAGNOSIS — M79672 Pain in left foot: Secondary | ICD-10-CM | POA: Diagnosis not present

## 2019-07-19 ENCOUNTER — Other Ambulatory Visit: Payer: Self-pay

## 2019-07-19 ENCOUNTER — Other Ambulatory Visit: Payer: Self-pay | Admitting: Cardiology

## 2019-07-19 ENCOUNTER — Ambulatory Visit (HOSPITAL_COMMUNITY): Payer: Medicare Other | Attending: Cardiovascular Disease

## 2019-07-19 DIAGNOSIS — I340 Nonrheumatic mitral (valve) insufficiency: Secondary | ICD-10-CM | POA: Insufficient documentation

## 2019-07-19 DIAGNOSIS — I2510 Atherosclerotic heart disease of native coronary artery without angina pectoris: Secondary | ICD-10-CM | POA: Insufficient documentation

## 2019-07-19 DIAGNOSIS — E785 Hyperlipidemia, unspecified: Secondary | ICD-10-CM | POA: Insufficient documentation

## 2019-07-19 DIAGNOSIS — I719 Aortic aneurysm of unspecified site, without rupture: Secondary | ICD-10-CM | POA: Insufficient documentation

## 2019-07-19 DIAGNOSIS — I10 Essential (primary) hypertension: Secondary | ICD-10-CM | POA: Insufficient documentation

## 2019-07-19 DIAGNOSIS — I351 Nonrheumatic aortic (valve) insufficiency: Secondary | ICD-10-CM | POA: Insufficient documentation

## 2019-07-19 DIAGNOSIS — I4891 Unspecified atrial fibrillation: Secondary | ICD-10-CM | POA: Insufficient documentation

## 2019-07-19 DIAGNOSIS — Z8249 Family history of ischemic heart disease and other diseases of the circulatory system: Secondary | ICD-10-CM | POA: Diagnosis not present

## 2019-07-22 ENCOUNTER — Telehealth: Payer: Self-pay

## 2019-07-22 DIAGNOSIS — I719 Aortic aneurysm of unspecified site, without rupture: Secondary | ICD-10-CM

## 2019-07-22 NOTE — Telephone Encounter (Signed)
Notes recorded by Frederik Schmidt, RN on 07/22/2019 at 10:56 AM EDT  The patient has been notified of the result and verbalized understanding. All questions (if any) were answered.  Frederik Schmidt, RN 07/22/2019 10:56 AM

## 2019-07-22 NOTE — Telephone Encounter (Signed)
I spoke to the patient who is being scheduled for a Chest Ct without contrast and he needs sedation.  He believes that Valium helped in the past and expressed his anxiety.  Please advise, thank you.

## 2019-07-22 NOTE — Telephone Encounter (Signed)
-----   Message from Sueanne Margarita, MD sent at 07/22/2019 10:15 AM EDT ----- Thanks Will.  Triage:  please order chest CT (WITHOUT) contrast for aortic aneurysm.  Patient has hx of anaphylaxis with contrast in the past.  Traci ----- Message ----- From: Constance Haw, MD Sent: 07/22/2019   9:09 AM EDT To: Sueanne Margarita, MD  Looks like you wanted a CT of his aorta but none was ordered. Do I need to do anything to assist? ----- Message ----- From: Sherren Mocha, MD Sent: 07/20/2019   6:23 PM EDT To: Constance Haw, MD, Theodoro Parma, RN  Pt of Dr Curt Bears. Will forward to him.

## 2019-07-24 NOTE — Telephone Encounter (Signed)
OK to give him Valium 5mg  (#1 tablet with no refill) to take on arrival to Chest CT. He will need someone to take him to the procedure and after the scan home

## 2019-07-25 MED ORDER — DIAZEPAM 5 MG PO TABS: 5.0000 mg | ORAL_TABLET | Freq: Once | ORAL | 0 refills | Status: AC

## 2019-07-25 NOTE — Telephone Encounter (Signed)
LMTCB

## 2019-07-25 NOTE — Telephone Encounter (Addendum)
Pt advised and will have someone else drive him to the CT appt. RX printed in error.. RX verbally called in to the [facility] on [address].

## 2019-07-25 NOTE — Addendum Note (Signed)
Addended by: Stephani Police on: 07/25/2019 10:42 AM   Modules accepted: Orders

## 2019-07-27 ENCOUNTER — Other Ambulatory Visit: Payer: Self-pay

## 2019-07-27 ENCOUNTER — Ambulatory Visit (INDEPENDENT_AMBULATORY_CARE_PROVIDER_SITE_OTHER)
Admission: RE | Admit: 2019-07-27 | Discharge: 2019-07-27 | Disposition: A | Discharge status: Home / Self Care | Payer: Medicare Other | Source: Ambulatory Visit | Attending: Cardiology | Admitting: Cardiology

## 2019-07-27 DIAGNOSIS — I719 Aortic aneurysm of unspecified site, without rupture: Secondary | ICD-10-CM

## 2019-07-27 DIAGNOSIS — I712 Thoracic aortic aneurysm, without rupture: Secondary | ICD-10-CM | POA: Diagnosis not present

## 2019-07-27 IMAGING — CT CT CHEST W/O CM
2 of 3 series · 15 of 36 positions shown, 18 images · non-contrast
Comparison: [DATE].

CLINICAL DATA: Aortic aneurysm.

EXAM:
CT CHEST WITHOUT CONTRAST
TECHNIQUE: Multidetector CT imaging of the chest was performed following the
standard protocol without IV contrast.

[Series 2: thorax · axial · 0.85mm/px · z∈[-345,-73]mm · 12 of 160 slices shown, 15 images]
[im 12/160  mediastinal]
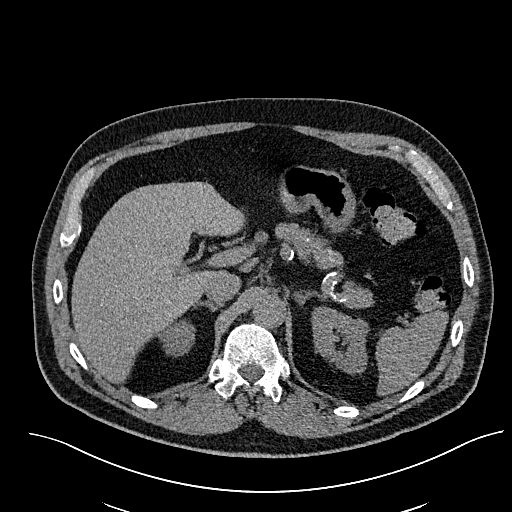
[im 12/160  lung]
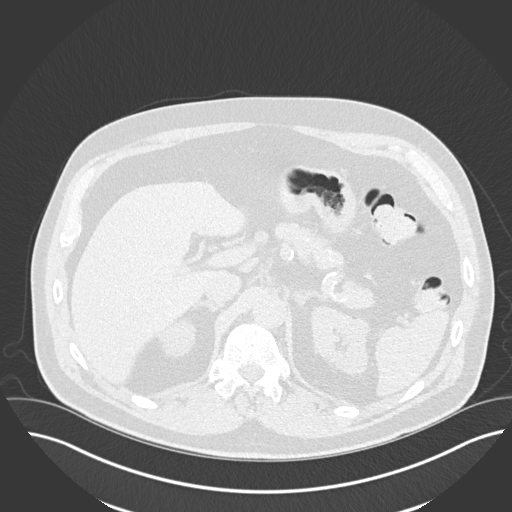
[im 24/160  lung]
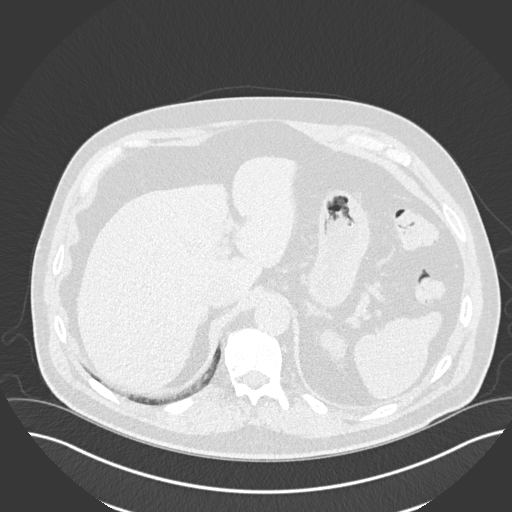
[im 36/160  lung]
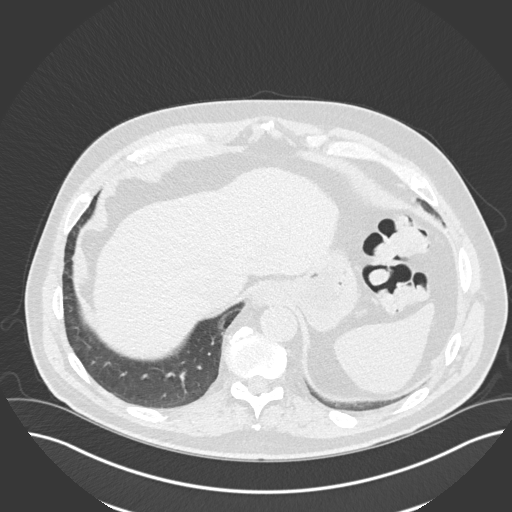
[im 48/160  lung]
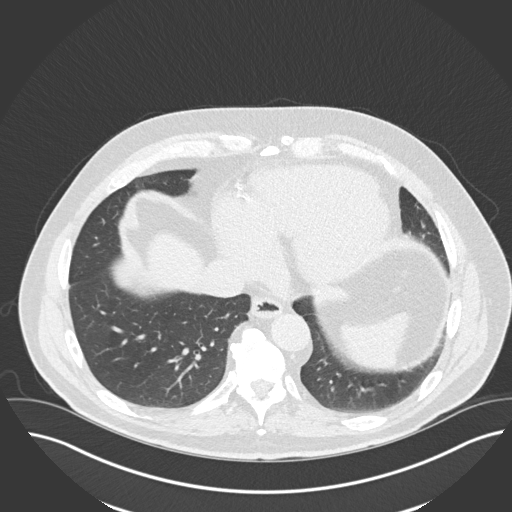
[im 59/160  mediastinal]
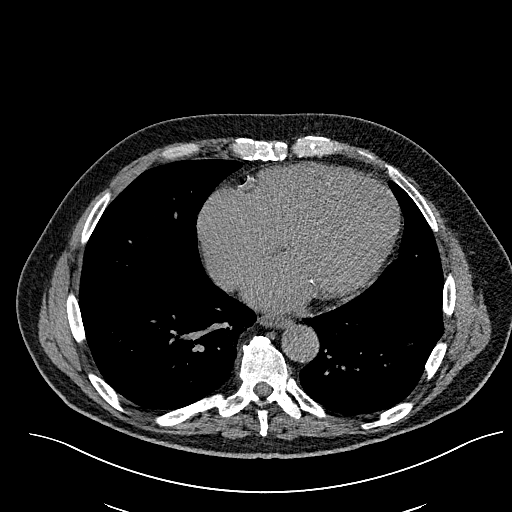
[im 59/160  lung]
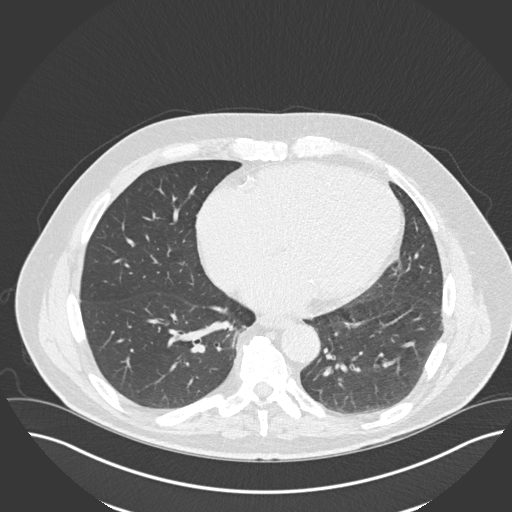
[im 71/160  lung]
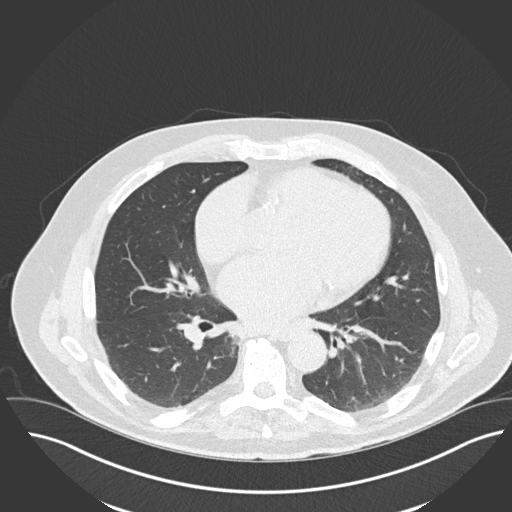
[im 89/160  lung]
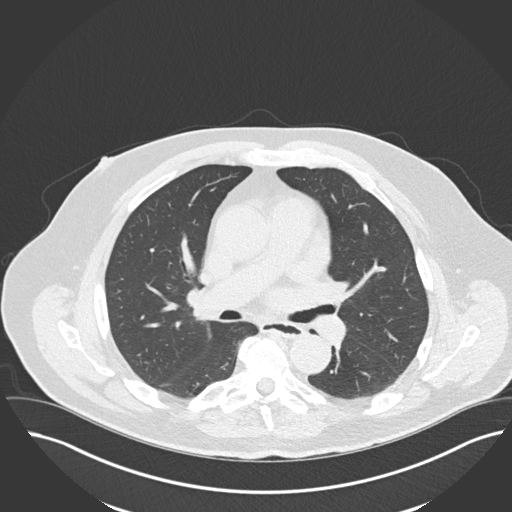
[im 101/160  lung]
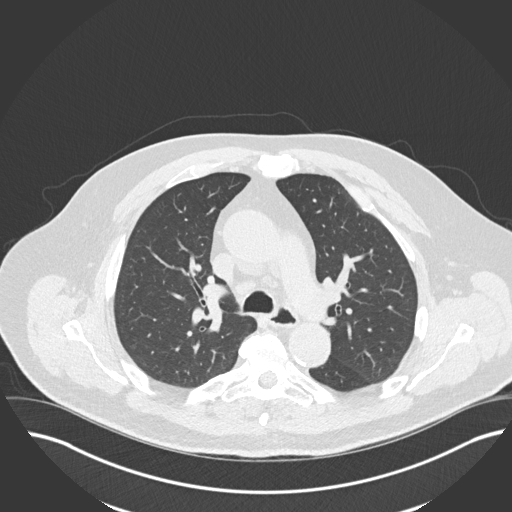
[im 112/160  mediastinal]
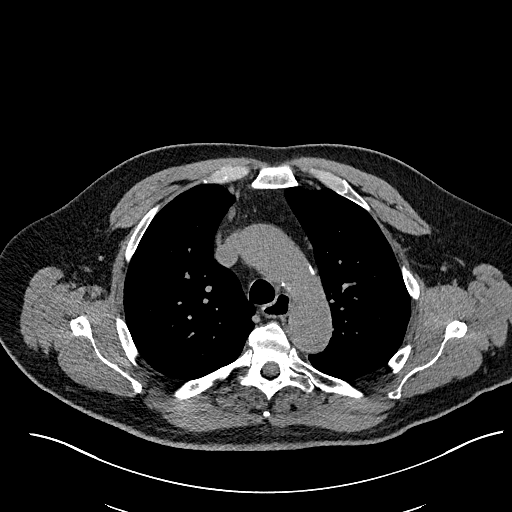
[im 112/160  lung]
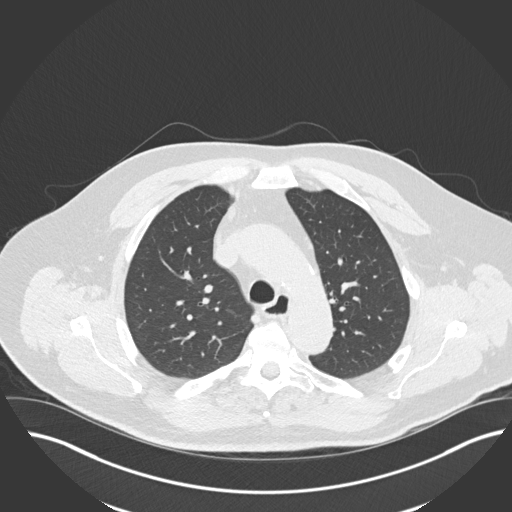
[im 124/160  lung]
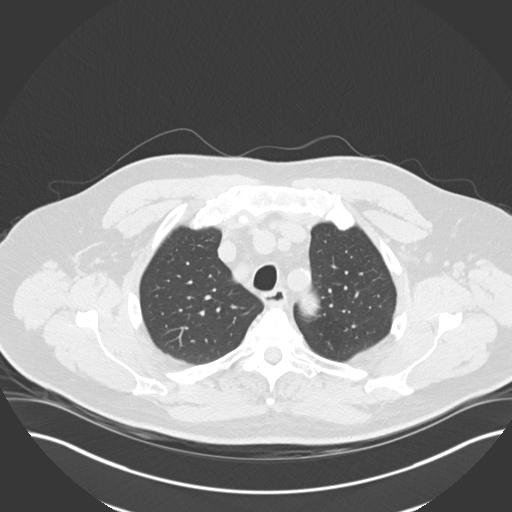
[im 136/160  lung]
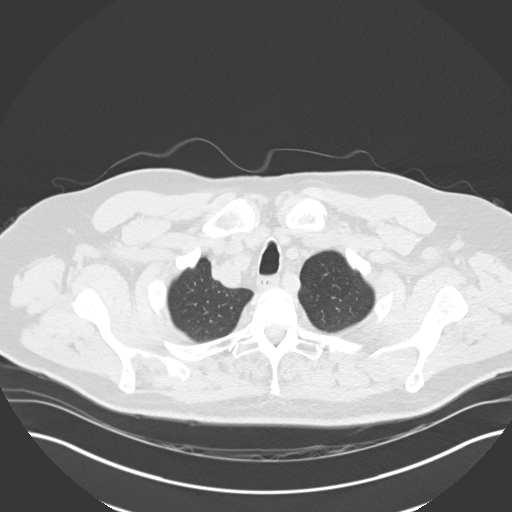
[im 148/160  lung]
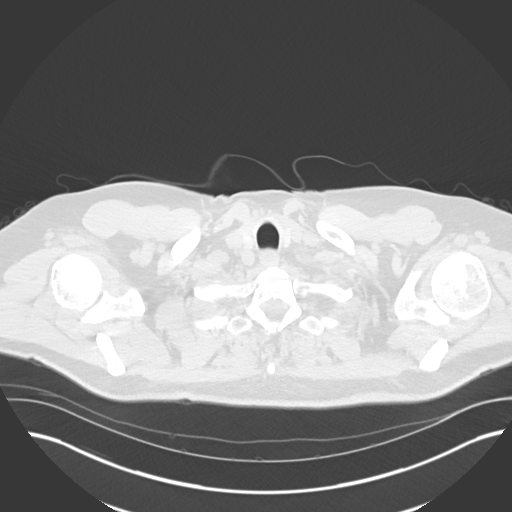

[Series 5: coronal · coronal · 0.62mm/px · 3 of 139 slices shown]
[im 28/139  lung]
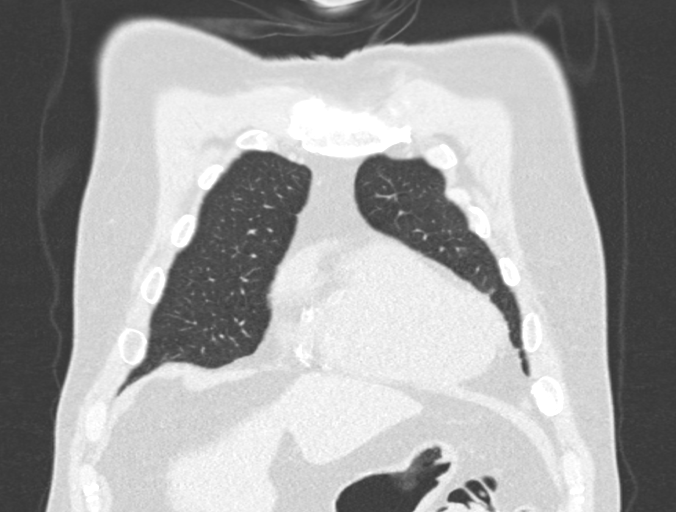
[im 56/139  lung]
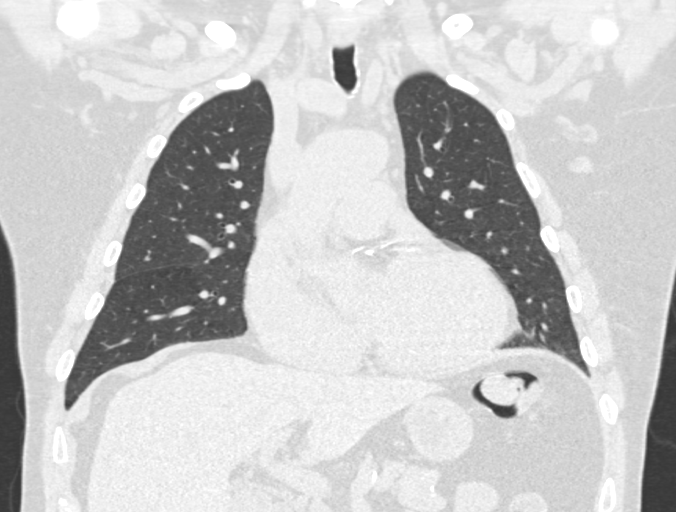
[im 83/139  lung]
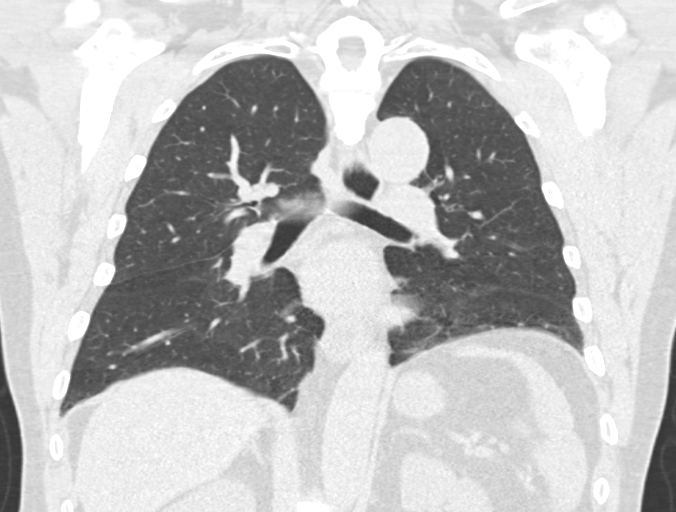

[15 of 36 positions shown; findings below may reference images not displayed]

FINDINGS: Cardiovascular: Atherosclerotic calcification of the aorta, aortic
valve and coronary arteries. Ascending aorta measures up to 4.0 cm,
as before. Heart is enlarged. No pericardial effusion.

Mediastinum/Nodes: Mediastinal and axillary lymph nodes are not
enlarged by CT size criteria. Hilar regions are difficult to
definitively evaluate without IV contrast. Esophagus is grossly
unremarkable.

Lungs/Pleura: Lungs are clear. No pleural fluid. Airway is
unremarkable.

Upper Abdomen: Visualized portions of the liver, gallbladder and
adrenal glands are unremarkable. Punctate stone in the right kidney.
Visualized portions of the kidneys, spleen, pancreas, stomach and
bowel are otherwise unremarkable. Upper abdominal lymph nodes are
not enlarged by CT size criteria.

Musculoskeletal: Degenerative changes in the spine. No worrisome
lytic or sclerotic lesions.
IMPRESSION: 1. Ascending Aortic aneurysm NOS ([O2]-[O2]), stable. Recommend
annual imaging followup by CTA or MRA. This recommendation follows
[O2] ACCF/AHA/AATS/ACR/ASA/SCA/MIANO/MIANO/MIANO/MIANO Guidelines for the
Diagnosis and Management of Patients with Thoracic Aortic Disease.
Circulation. [O2]; 121: [id number]. Aortic aneurysm NOS
([O2]-[O2]).
2. Punctate stone, right kidney.
3. Aortic atherosclerosis ([O2]-170.0). Coronary artery
calcification.

## 2019-07-28 ENCOUNTER — Other Ambulatory Visit: Payer: Self-pay

## 2019-07-28 ENCOUNTER — Telehealth: Payer: Self-pay

## 2019-07-28 DIAGNOSIS — I719 Aortic aneurysm of unspecified site, without rupture: Secondary | ICD-10-CM

## 2019-07-28 DIAGNOSIS — N200 Calculus of kidney: Secondary | ICD-10-CM | POA: Diagnosis not present

## 2019-07-28 NOTE — Telephone Encounter (Signed)
Notes recorded by Frederik Schmidt, RN on 07/28/2019 at 8:55 AM EDT  The patient has been notified of the result and verbalized understanding. All questions (if any) were answered.  Frederik Schmidt, RN 07/28/2019 8:55 AM

## 2019-07-28 NOTE — Telephone Encounter (Signed)
-----   Message from Sueanne Margarita, MD sent at 07/27/2019  9:44 PM EDT ----- Coronary atherosclerosis on CT with known hx of CAD.  Stable ascending aortic aneurysm measuring 4cm.  There was an incidental finding of a right kidney stone. - please forward to PCP for review.  Repeat echo in 1year for ascending aortic aneursym

## 2019-07-29 ENCOUNTER — Other Ambulatory Visit: Payer: Self-pay | Admitting: Internal Medicine

## 2019-07-29 ENCOUNTER — Ambulatory Visit
Admission: RE | Admit: 2019-07-29 | Discharge: 2019-07-29 | Disposition: A | Discharge status: Home / Self Care | Payer: Medicare Other | Source: Ambulatory Visit | Attending: Internal Medicine | Admitting: Internal Medicine

## 2019-07-29 DIAGNOSIS — N200 Calculus of kidney: Secondary | ICD-10-CM

## 2019-07-29 IMAGING — CT CT ABD-PELV W/O CM
1 of 2 series · 14 of 32 positions shown, 19 images · non-contrast
Comparison: CT chest, [DATE]

CLINICAL DATA: Right flank pain, no hematuria

EXAM:
CT ABDOMEN AND PELVIS WITHOUT CONTRAST
TECHNIQUE: Multidetector CT imaging of the abdomen and pelvis was performed
following the standard protocol without IV contrast.

[Series 2: abd/pelvis w/(date) · axial · 0.94mm/px · z∈[-516,-71]mm · 14 of 101 slices shown, 19 images]
[im 6/101  soft-tissue]
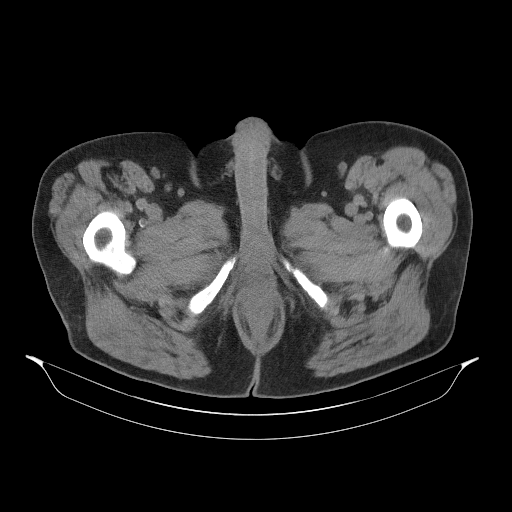
[im 6/101  bone]
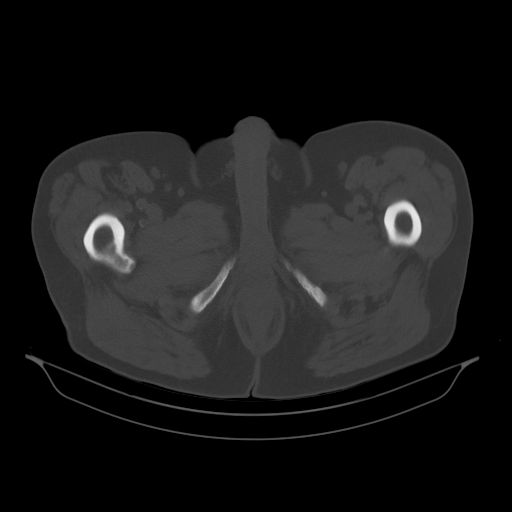
[im 16/101  soft-tissue]
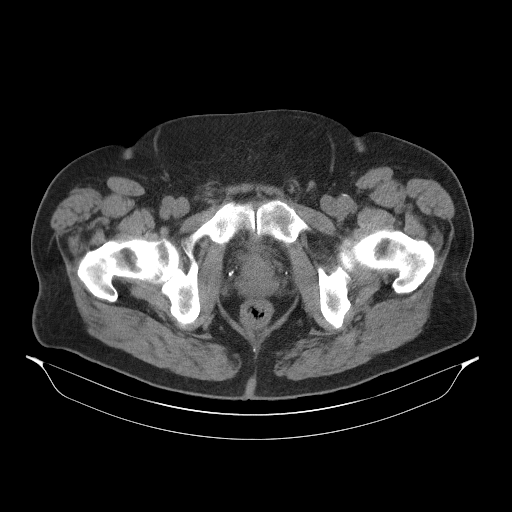
[im 22/101  soft-tissue]
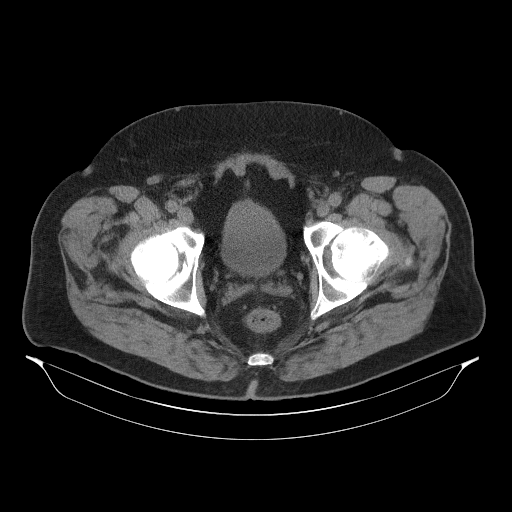
[im 27/101  soft-tissue]
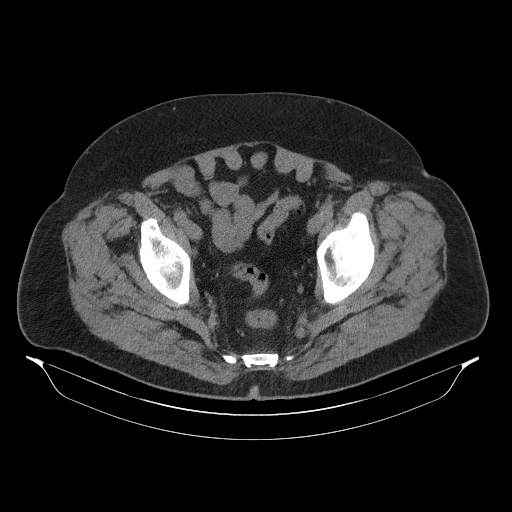
[im 37/101  soft-tissue]
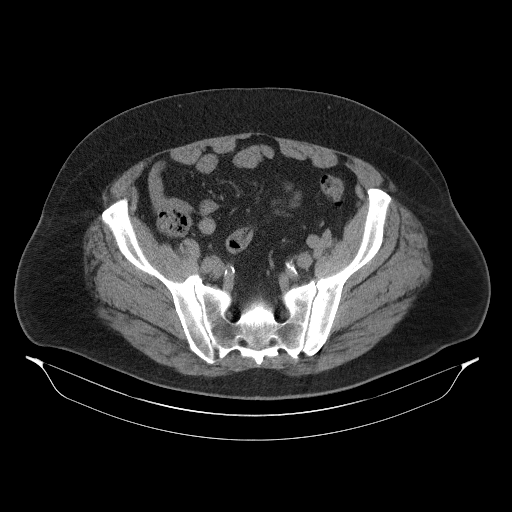
[im 43/101  soft-tissue]
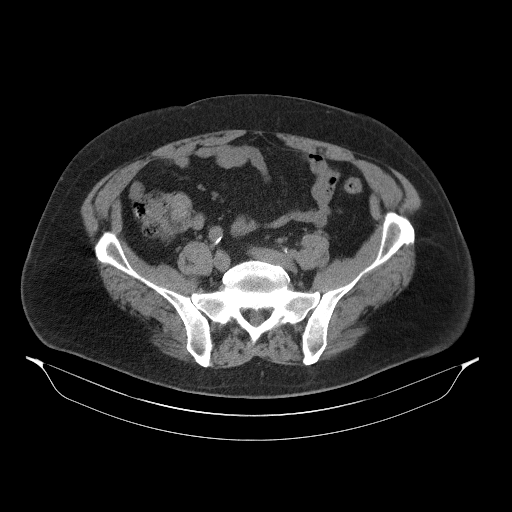
[im 53/101  soft-tissue]
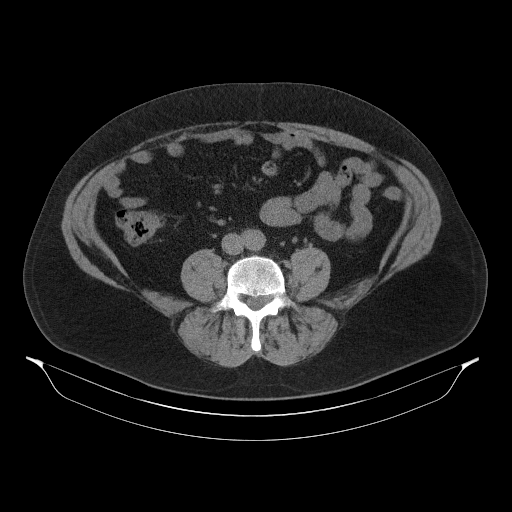
[im 58/101  soft-tissue]
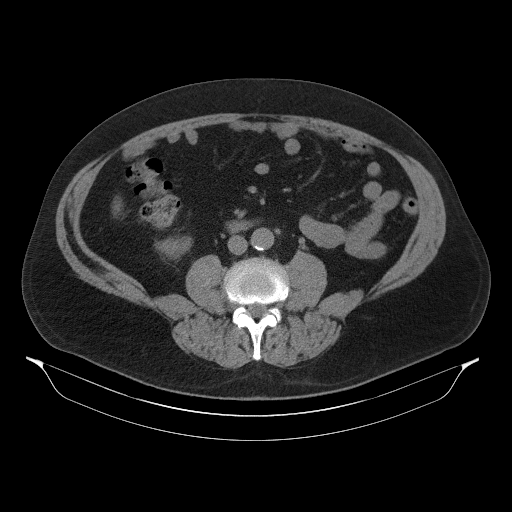
[im 64/101  soft-tissue]
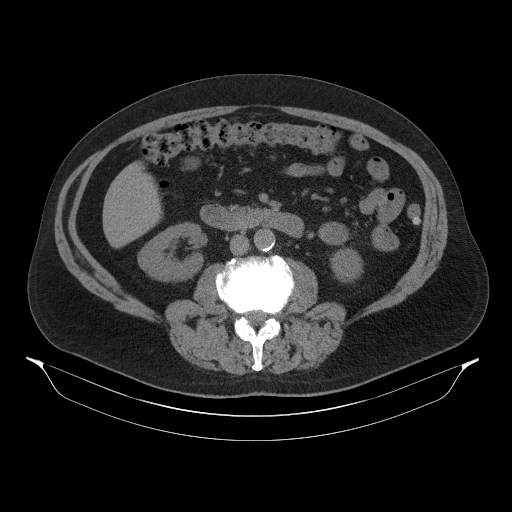
[im 64/101  bone]
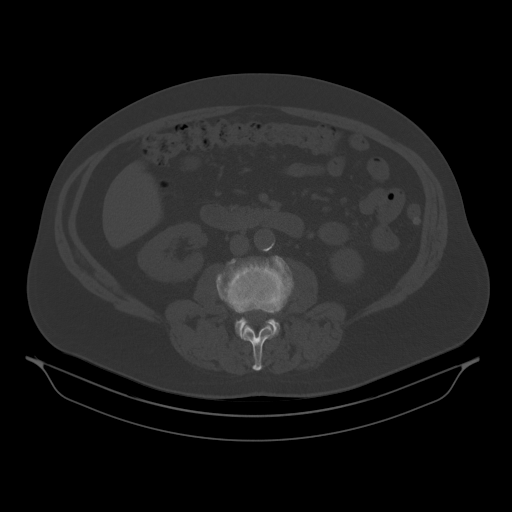
[im 74/101  soft-tissue]
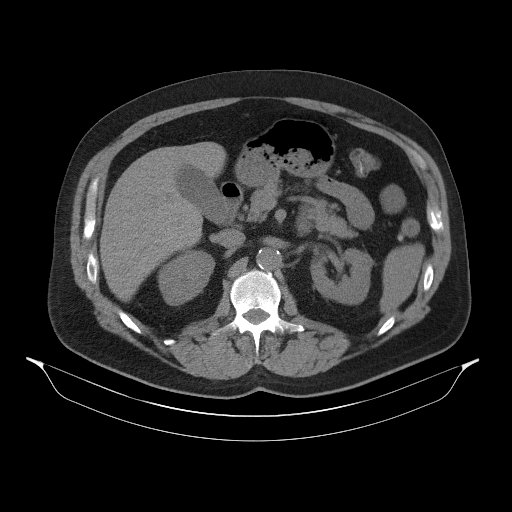
[im 79/101  soft-tissue]
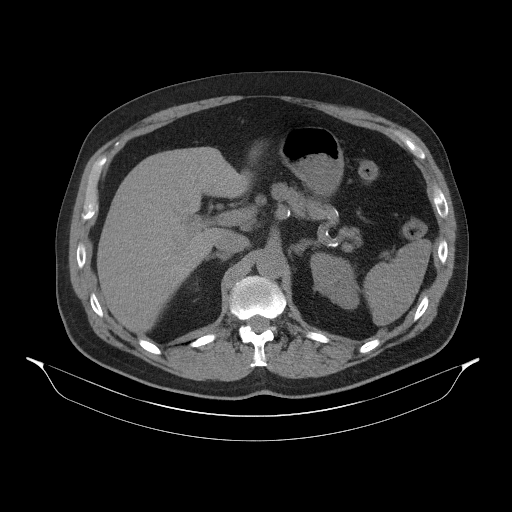
[im 79/101  lung]
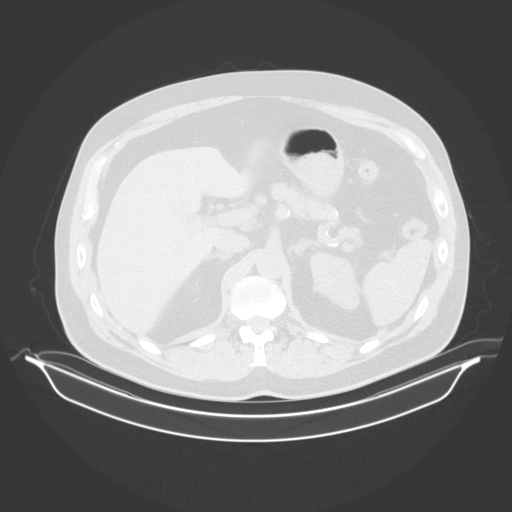
[im 85/101  soft-tissue]
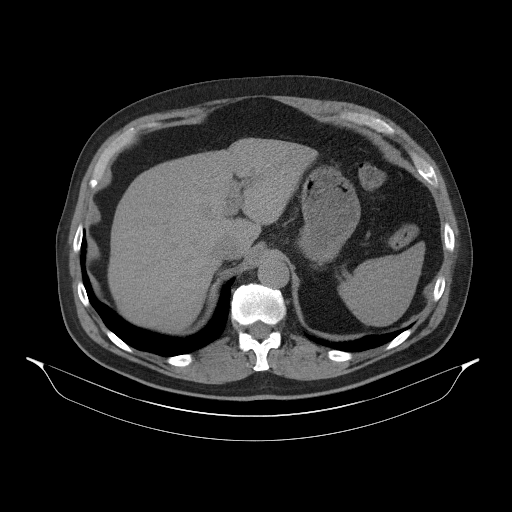
[im 85/101  lung]
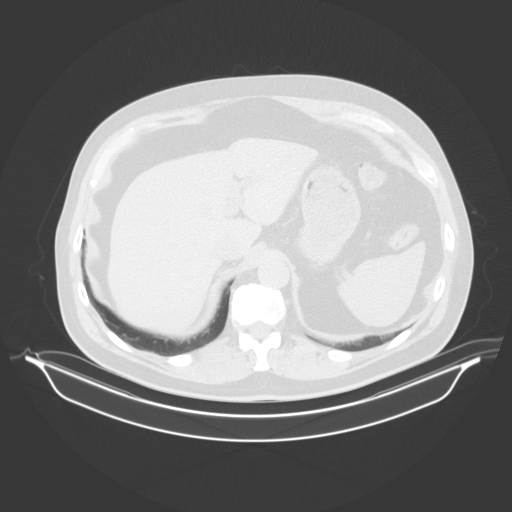
[im 90/101  lung]
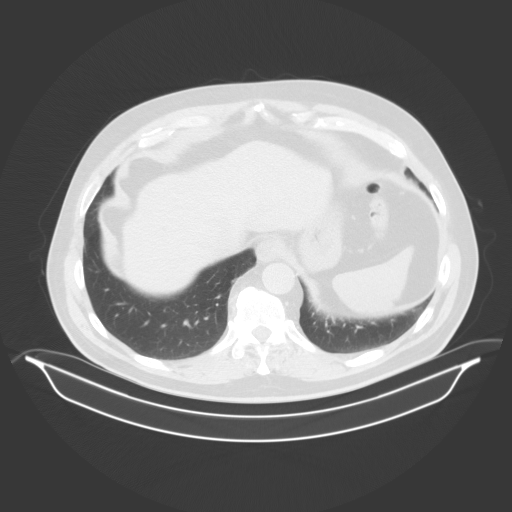
[im 95/101  soft-tissue]
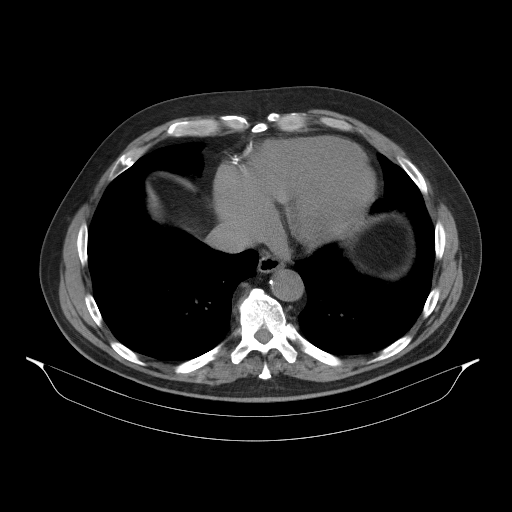
[im 95/101  lung]
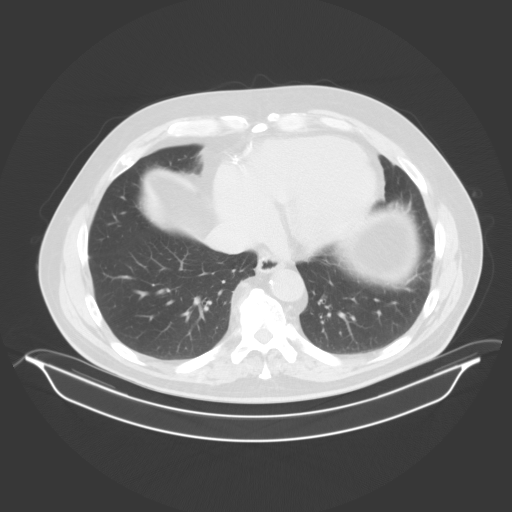

[14 of 32 positions shown; findings below may reference images not displayed]

FINDINGS: Lower chest: Cardiomegaly.  Coronary artery calcifications.

Hepatobiliary: No solid liver abnormality is seen. No gallstones,
gallbladder wall thickening, or biliary dilatation.

Pancreas: Unremarkable. No pancreatic ductal dilatation or
surrounding inflammatory changes.

Spleen: Normal in size without significant abnormality.

Adrenals/Urinary Tract: Adrenal glands are unremarkable. Tiny
nonobstructive calculus of the superior pole of the right kidney.
Kidneys are otherwise normal, without renal calculi, solid lesion,
or hydronephrosis. Bladder is unremarkable.

Stomach/Bowel: Stomach is within normal limits. Appendix is not
clearly visualized. No evidence of bowel wall thickening,
distention, or inflammatory changes. Descending and sigmoid
diverticulosis.

Vascular/Lymphatic: Aortic atherosclerosis. No enlarged abdominal or
pelvic lymph nodes.

Reproductive: No mass or other significant abnormality.

Other: No abdominal wall hernia or abnormality. No abdominopelvic
ascites.

Musculoskeletal: No acute or significant osseous findings.
IMPRESSION: 1. Tiny nonobstructive calculus of the superior pole of the right
kidney. No other evidence of urinary tract calculus or
hydronephrosis.

2. No acute noncontrast CT findings of the abdomen or pelvis to
explain right flank pain.

3.  Diverticulosis without evidence of acute diverticulitis.

4.  Coronary artery disease.  Aortic Atherosclerosis ([4A]-[4A]).

## 2019-08-01 ENCOUNTER — Other Ambulatory Visit: Payer: Self-pay

## 2019-08-01 ENCOUNTER — Encounter: Payer: Self-pay | Admitting: Cardiology

## 2019-08-01 ENCOUNTER — Ambulatory Visit (INDEPENDENT_AMBULATORY_CARE_PROVIDER_SITE_OTHER): Payer: Medicare Other | Admitting: Cardiology

## 2019-08-01 VITALS — BP 120/82 | HR 72 | Ht 71.0 in | Wt 243.0 lb

## 2019-08-01 DIAGNOSIS — I4819 Other persistent atrial fibrillation: Secondary | ICD-10-CM | POA: Diagnosis not present

## 2019-08-01 NOTE — Patient Instructions (Addendum)
Medication Instructions:  °Your physician recommends that you continue on your current medications as directed. Please refer to the Current Medication list given to you today. ° °* If you need a refill on your cardiac medications before your next appointment, please call your pharmacy.  ° °Labwork: °None ordered ° °Testing/Procedures: °None ordered ° °Follow-Up: °Your physician recommends that you schedule a follow-up appointment in: 3 months with Dr. Camnitz. ° °Thank you for choosing [facility]!! ° ° °Kamber Vignola, RN °[phone] ° °Any Other Special Instructions Will Be Listed Below (If Applicable). ° ° °Cardiac Ablation °Cardiac ablation is a procedure to disable (ablate) a small amount of heart tissue in very specific places. The heart has many electrical connections. Sometimes these connections are abnormal and can cause the heart to beat very fast or irregularly. Ablating some of the problem areas can improve the heart rhythm or return it to normal. Ablation may be done for people who: °· Have Wolff-Parkinson-White syndrome. °· Have fast heart rhythms (tachycardia). °· Have taken medicines for an abnormal heart rhythm (arrhythmia) that were not effective or caused side effects. °· Have a high-risk heartbeat that may be life-threatening. °During the procedure, a small incision is made in the neck or the groin, and a long, thin, flexible tube (catheter) is inserted into the incision and moved to the heart. Small devices (electrodes) on the tip of the catheter will send out electrical currents. A type of X-ray (fluoroscopy) will be used to help guide the catheter and to provide images of the heart. °Tell a health care provider about: °· Any allergies you have. °· All medicines you are taking, including vitamins, herbs, eye drops, creams, and over-the-counter medicines. °· Any problems you or family members have had with anesthetic medicines. °· Any blood disorders you have. °· Any surgeries you have  had. °· Any medical conditions you have, such as kidney failure. °· Whether you are pregnant or may be pregnant. °What are the risks? °Generally, this is a safe procedure. However, problems may occur, including: °· Infection. °· Bruising and bleeding at the catheter insertion site. °· Bleeding into the chest, especially into the sac that surrounds the heart. This is a serious complication. °· Stroke or blood clots. °· Damage to other structures or organs. °· Allergic reaction to medicines or dyes. °· Need for a permanent pacemaker if the normal electrical system is damaged. A pacemaker is a small computer that sends electrical signals to the heart and helps your heart beat normally. °· The procedure not being fully effective. This may not be recognized until months later. Repeat ablation procedures are sometimes required. °What happens before the procedure? °· Follow instructions from your health care provider about eating or drinking restrictions. °· Ask your health care provider about: °? Changing or stopping your regular medicines. This is especially important if you are taking diabetes medicines or blood thinners. °? Taking medicines such as aspirin and ibuprofen. These medicines can thin your blood. Do not take these medicines before your procedure if your health care provider instructs you not to. °· Plan to have someone take you home from the hospital or clinic. °· If you will be going home right after the procedure, plan to have someone with you for 24 hours. °What happens during the procedure? °· To lower your risk of infection: °? Your health care team will wash or sanitize their hands. °? Your skin will be washed with soap. °? Hair may be removed from the incision area. °·   An IV tube will be inserted into one of your veins. °· You will be given a medicine to help you relax (sedative). °· The skin on your neck or groin will be numbed. °· An incision will be made in your neck or your groin. °· A needle will  be inserted through the incision and into a large vein in your neck or groin. °· A catheter will be inserted into the needle and moved to your heart. °· Dye may be injected through the catheter to help your surgeon see the area of the heart that needs treatment. °· Electrical currents will be sent from the catheter to ablate heart tissue in desired areas. There are three types of energy that may be used to ablate heart tissue: °? Heat (radiofrequency energy). °? Laser energy. °? Extreme cold (cryoablation). °· When the necessary tissue has been ablated, the catheter will be removed. °· Pressure will be held on the catheter insertion area to prevent excessive bleeding. °· A bandage (dressing) will be placed over the catheter insertion area. °The procedure may vary among health care providers and hospitals. °What happens after the procedure? °· Your blood pressure, heart rate, breathing rate, and blood oxygen level will be monitored until the medicines you were given have worn off. °· Your catheter insertion area will be monitored for bleeding. You will need to lie still for a few hours to ensure that you do not bleed from the catheter insertion area. °· Do not drive for 24 hours or as long as directed by your health care provider. °Summary °· Cardiac ablation is a procedure to disable (ablate) a small amount of heart tissue in very specific places. Ablating some of the problem areas can improve the heart rhythm or return it to normal. °· During the procedure, electrical currents will be sent from the catheter to ablate heart tissue in desired areas. °This information is not intended to replace advice given to you by your health care provider. Make sure you discuss any questions you have with your health care provider. °Document Released: 03/15/2009 Document Revised: 04/19/2018 Document Reviewed: 09/15/2016 °Elsevier Patient Education © 2020 [employer] ° ° ° ° ° ° °

## 2019-08-01 NOTE — Progress Notes (Signed)
Electrophysiology Office Note   Date:  08/01/2019   ID:  Troy [name], DOB [birth year], MRN [id number]  PCP:  Troy Hatchet, MD  Cardiologist:  Troy [name] Primary Electrophysiologist:  Kelise Kuch Troy Leeds, MD    No chief complaint on file.    History of Present Illness: Troy [name] is a 68 y.o. male who is being seen today for the evaluation of atrial fibrillation at the request of Troy Hatchet, MD. Presenting today for electrophysiology evaluation. H/o 06/2015 DES mLAD &RPDA w/ VF arrest, PCI in [place] 2010, HTN, HLD, MR, PAF, pulm nodule stable 07/2016. He was recently loaded on amiodarone and  successfully cardioverted to SR around 50 bpm. Patient went back into AF on 02/13/17. His main complaint was shortness of breath. Had repeat cardioversion 02/20/17.  He had shortness of breath on amiodarone which has since been stopped.  Today, denies symptoms of palpitations, chest pain, orthopnea, PND, lower extremity edema, claudication, dizziness, presyncope, syncope, bleeding, or neurologic sequela. The patient is tolerating medications without difficulties.  Unfortunately, he is back in atrial fibrillation.  He has some shortness of breath when he exerts himself as well as some weakness and fatigue, but otherwise feels well.  He is currently not on any anticoagulation as he stopped his Xarelto due to cost issues approximately 1 year ago.   Past Medical History:  Diagnosis Date   Arthritis    "knees" (01/02/2017)   Ascending aorta dilatation (HCC)    20mm by echo 05/2018, 76mm by CT 2017 and 56mm by CT 2019   CAD (coronary artery disease)    a. s/p PCI in 2010 in [place]. b. Unstable angina/LHC [id number] complicated by V fib arrest after contrast injection. Cath 06/19/2015 s/p DES to mid LAD and RPDA. c. 11/2016: chest pain/ abnormal nuclear stress test prompting cath 11/13/16 showing 80% ostial diag, 60% mid RCA, no acute lesions, medical therapy recommended.   Dyslipidemia    Essential  hypertension    Lung nodule < 6cm on CT 06/16/15   Right lung   Mitral regurgitation    a. Mild-mod by echo 06/2015. b. not seen other than trivial in 2018.   OSA on CPAP    Persistent atrial fibrillation    a. 06/2015 noted to be in new a-fib when arrived with unstable angina. Converted to NSR after being shocked in the cath lab for vfib;  b. 06/2015 Eliquis initiated as PAF noted on event monitor. c. Recurrent atrial fib?flutter 10/2016, issues with recurrent AF requiring multiple DCCVs in 02/2017. Failed sotalol, not candidate for Tikosyn due to cost/QTC, Multaq not felt likely strong enough.   Pulmonary nodule 07/2015   a. 1.5 x 1.2 cm smoothly marginated nodule in the central aspect of the right lower lobe with recommendation correlation with nonemergent PET-CT to exclude a neoplasm , stable by CT 07/2016   Severe left ventricular hypertrophy    Ventricular fibrillation ([place])    a. occured during cath 06/18/2015.   Past Surgical History:  Procedure Laterality Date   CARDIAC CATHETERIZATION N/A 06/18/2015   Procedure: Left Heart Cath and Coronary Angiography;  Surgeon: Burnell Blanks, MD;  Location: [facility];  Service: Cardiovascular;  Laterality: N/A;   CARDIAC CATHETERIZATION N/A 06/19/2015   Procedure: Coronary Stent Intervention;  Surgeon: Peter M Martinique, MD;  Location: [facility];  Service: Cardiovascular;  Laterality: N/A;   CARDIAC CATHETERIZATION N/A 11/13/2016   Procedure: Left Heart Cath and Coronary Angiography;  Surgeon: Lorretta Harp, MD;  Location: [facility];  Service: Cardiovascular;  Laterality: N/A;   CARDIOVERSION N/A 11/17/2016   Procedure: CARDIOVERSION;  Surgeon: Larey Dresser, MD;  Location: [facility];  Service: Cardiovascular;  Laterality: N/A;   CARDIOVERSION N/A 01/03/2017   Procedure: CARDIOVERSION;  Surgeon: Lelon Perla, MD;  Location: [facility];  Service: Cardiovascular;  Laterality: N/A;   CARDIOVERSION N/A 02/05/2017     Procedure: Cardioversion;  Surgeon: Evans Lance, MD;  Location: [facility];  Service: Cardiovascular;  Laterality: N/A;   CARDIOVERSION N/A 02/20/2017   Procedure: Cardioversion;  Surgeon: Evans Lance, MD;  Location: [facility];  Service: Cardiovascular;  Laterality: N/A;   CARTILAGE SURGERY Bilateral    "thumbs"   CATARACT EXTRACTION W/ INTRAOCULAR LENS  IMPLANT, BILATERAL Bilateral    CORONARY ANGIOPLASTY WITH STENT PLACEMENT  ~ 2007   JOINT REPLACEMENT     KNEE ARTHROSCOPY Right    RETINAL DETACHMENT SURGERY Bilateral    "laser thing"   TEE WITHOUT CARDIOVERSION N/A 11/17/2016   Procedure: TRANSESOPHAGEAL ECHOCARDIOGRAM (TEE);  Surgeon: Larey Dresser, MD;  Location: [facility];  Service: Cardiovascular;  Laterality: N/A;   TOTAL KNEE ARTHROPLASTY Left 2000s   TOTAL KNEE ARTHROPLASTY Right 02/08/2018   Procedure: RIGHT TOTAL KNEE ARTHROPLASTY;  Surgeon: Gaynelle Arabian, MD;  Location: [facility];  Service: Orthopedics;  Laterality: Right;  with block     Current Outpatient Medications  Medication Sig Dispense Refill   atorvastatin (LIPITOR) 80 MG tablet Take 1 tablet (80 mg total) by mouth daily. 90 tablet 3   ezetimibe (ZETIA) 10 MG tablet TAKE 1 TABLET(10 MG) BY MOUTH DAILY 90 tablet 3   gabapentin (NEURONTIN) 300 MG capsule Take 1 capsule (300 mg total) by mouth 3 (three) times daily. Gabapentin 300 mg Protocol Take a 300 mg capsule three times a day for two weeks, Then a 300 mg capsule twice a day for two weeks, Then a 300 mg capsule once a day for two weeks, then discontinue the Gabapentin. 84 capsule 0   HYDROcodone-acetaminophen (NORCO/VICODIN) 5-325 MG tablet Take 1-2 tablets by mouth every 4 (four) hours as needed for moderate pain or severe pain. 50 tablet 0   LORazepam (ATIVAN) 0.5 MG tablet Take 0.5 mg by mouth daily as needed for anxiety.     losartan (COZAAR) 100 MG tablet TAKE 1 TABLET(100 MG) BY MOUTH DAILY 90 tablet 0    methocarbamol (ROBAXIN) 500 MG tablet Take 1 tablet (500 mg total) by mouth every 6 (six) hours as needed for muscle spasms. 80 tablet 0   metoprolol tartrate (LOPRESSOR) 100 MG tablet TAKE 1 TABLET(100 MG) BY MOUTH TWICE DAILY 180 tablet 2   nitroGLYCERIN (NITROSTAT) 0.3 MG SL tablet Place 1 tablet (0.3 mg total) under the tongue every 5 (five) minutes as needed for chest pain. 90 tablet 6   oxymetazoline (AFRIN) 0.05 % nasal spray Place 1 spray into both nostrils 2 (two) times daily as needed (FOR SINUS CONGESTION.). 30 mL 0   spironolactone (ALDACTONE) 25 MG tablet TAKE 1 TABLET(25 MG) BY MOUTH DAILY 30 tablet 11   tamsulosin (FLOMAX) 0.4 MG CAPS capsule Take 1 capsule (0.4 mg total) by mouth daily after supper. 14 capsule 0   traMADol (ULTRAM) 50 MG tablet Take 1-2 tablets (50-100 mg total) by mouth every 6 (six) hours as needed for moderate pain (not relieved by oxycodone). 56 tablet 0   No current facility-administered medications for this visit.     Allergies:  Gluten meal, Iodinated diagnostic agents, Metrizamide, Tizanidine, Whey, Morphine and related, Apple, Tizanidine hcl, and Wheat bran   Social History:  The patient  reports that he has never smoked. He has never used smokeless tobacco. He reports current alcohol use of about 2.0 standard drinks of alcohol per week. He reports that he does not use drugs.   Family History:  The patient's family history includes Diabetes in his sister; Drug abuse in his father; Emphysema in his mother; Heart attack in his father, maternal grandfather, and paternal grandfather; Lung cancer in his mother; Stroke in his mother.    ROS:  Please see the history of present illness.   Otherwise, review of systems is positive for none.   All other systems are reviewed and negative.   PHYSICAL EXAM: VS:  BP 120/82    Pulse 72    Ht 5\' 11"  (1.803 m)    Wt 243 lb (110.2 kg)    SpO2 98%    BMI 33.89 kg/m  , BMI Body mass index is 33.89 kg/m. GEN: Well  nourished, well developed, in no acute distress  HEENT: normal  Neck: no JVD, carotid bruits, or masses Cardiac: iRRR; no murmurs, rubs, or gallops,no edema  Respiratory:  clear to auscultation bilaterally, normal work of breathing GI: soft, nontender, nondistended, + BS MS: no deformity or atrophy  Skin: warm and dry Neuro:  Strength and sensation are intact Psych: euthymic mood, full affect  EKG:  EKG is ordered today. Personal review of the ekg ordered shows atrial fibrillation, rate 72   Recent Labs: No results found for requested labs within last 8760 hours.    Lipid Panel     Component Value Date/Time   CHOL 201 (H) 11/16/2017 1027   TRIG 178 (H) 11/16/2017 1027   HDL 49 11/16/2017 1027   CHOLHDL 4.1 11/16/2017 1027   CHOLHDL 2.5 08/15/2015 0912   VLDL 15 08/15/2015 0912   LDLCALC 116 (H) 11/16/2017 1027     Wt Readings from Last 3 Encounters:  08/01/19 243 lb (110.2 kg)  02/08/18 228 lb (103.4 kg)  02/02/18 228 lb 2 oz (103.5 kg)      Other studies Reviewed: Additional studies/ records that were reviewed today include: TTE 11/18/16  Review of the above records today demonstrates:  - Left ventricle: The cavity size was normal. There was severe   concentric hypertrophy. Systolic function was normal. The   estimated ejection fraction was in the range of 60% to 65%. Wall   motion was normal; there were no regional wall motion   abnormalities. The study is not technically sufficient to allow   evaluation of LV diastolic function. - Aortic valve: Trileaflet; mildly calcified leaflets. There was   mild regurgitation. - Mitral valve: Mildly thickened leaflets . There was trivial   regurgitation. - Left atrium: Moderately dilated. - Right ventricle: The cavity size was mildly dilated. - Right atrium: Severely dilated. - Tricuspid valve: There was trivial regurgitation. - Pulmonary arteries: PA peak pressure: 20 mm Hg (S). - Inferior vena cava: The vessel was  normal in size. The   respirophasic diameter changes were in the normal range (= 50%),   consistent with normal central venous pressure.  LHC 11/13/16  Ost RPDA to RPDA lesion, 0 %stenosed.  Prox Cx to Mid Cx lesion, 0 %stenosed.  Prox LAD to Mid LAD lesion, 0 %stenosed.  Ost 1st Diag lesion, 80 %stenosed.  Mid RCA lesion, 60 %stenosed.  ASSESSMENT AND PLAN:  1.  Persistent atrial fibrillation: He has been off of anticoagulation for what he says is approximately 1 year.  He thinks he has been in atrial fibrillation since approximately February.  I do think he would feel better in sinus rhythm.  I discussed with him further options to get him into rhythm.  He has been on Tikosyn as well in the past and was having issues with cost and QT prolongation.  His only option would be ablation.  He Esmeralda Blanford call us back with any answer.  This patients CHA2DS2-VASc Score and unadjusted Ischemic Stroke Rate (% per year) is equal to 3.2 % stroke rate/year from a score of 3  Above score calculated as 1 point each if present [CHF, HTN, DM, Vascular=MI/PAD/Aortic Plaque, Age if 65-74, or Male] Above score calculated as 2 points each if present [Age > 75, or Stroke/TIA/TE]   2. Hypertension: Currently well controlled  3. Coronary artery disease with angina: No chest pain.  Continue current management.  4. Obstructive sleep apnea: CPAP compliance encouraged   Current medicines are reviewed at length with the patient today.   The patient does not have concerns regarding his medicines.  The following changes were made today: None  Labs/ tests ordered today include:  Orders Placed This Encounter  Procedures   EKG 12-Lead     Disposition:   FU with Diedra Sinor 3 months  Signed, Jareli Highland Troy Leeds, MD  08/01/2019 10:04 AM     [address] [place] [address] [phone] (office) [phone] (fax)

## 2019-10-08 ENCOUNTER — Other Ambulatory Visit: Payer: Self-pay | Admitting: Cardiology

## 2019-10-17 DIAGNOSIS — E7849 Other hyperlipidemia: Secondary | ICD-10-CM | POA: Diagnosis not present

## 2019-10-17 DIAGNOSIS — Z125 Encounter for screening for malignant neoplasm of prostate: Secondary | ICD-10-CM | POA: Diagnosis not present

## 2019-10-31 ENCOUNTER — Ambulatory Visit: Payer: Medicare Other | Admitting: Cardiology

## 2019-10-31 ENCOUNTER — Other Ambulatory Visit: Payer: Self-pay | Admitting: *Deleted

## 2019-10-31 MED ORDER — EZETIMIBE 10 MG PO TABS: ORAL_TABLET | ORAL | 0 refills | Status: DC

## 2020-01-09 DIAGNOSIS — H52203 Unspecified astigmatism, bilateral: Secondary | ICD-10-CM | POA: Diagnosis not present

## 2020-01-09 DIAGNOSIS — H532 Diplopia: Secondary | ICD-10-CM | POA: Diagnosis not present

## 2020-01-09 DIAGNOSIS — H43811 Vitreous degeneration, right eye: Secondary | ICD-10-CM | POA: Diagnosis not present

## 2020-01-09 DIAGNOSIS — H4911 Fourth [trochlear] nerve palsy, right eye: Secondary | ICD-10-CM | POA: Diagnosis not present

## 2020-01-10 ENCOUNTER — Other Ambulatory Visit: Payer: Self-pay | Admitting: Internal Medicine

## 2020-01-10 ENCOUNTER — Other Ambulatory Visit (HOSPITAL_COMMUNITY): Payer: Self-pay | Admitting: Internal Medicine

## 2020-01-10 DIAGNOSIS — I4891 Unspecified atrial fibrillation: Secondary | ICD-10-CM | POA: Diagnosis not present

## 2020-01-10 DIAGNOSIS — H532 Diplopia: Secondary | ICD-10-CM

## 2020-01-10 DIAGNOSIS — G43109 Migraine with aura, not intractable, without status migrainosus: Secondary | ICD-10-CM | POA: Diagnosis not present

## 2020-01-10 DIAGNOSIS — I10 Essential (primary) hypertension: Secondary | ICD-10-CM | POA: Diagnosis not present

## 2020-01-24 ENCOUNTER — Other Ambulatory Visit: Payer: Self-pay

## 2020-01-24 ENCOUNTER — Ambulatory Visit (INDEPENDENT_AMBULATORY_CARE_PROVIDER_SITE_OTHER): Payer: Medicare Other | Admitting: Cardiology

## 2020-01-24 ENCOUNTER — Encounter: Payer: Self-pay | Admitting: Cardiology

## 2020-01-24 VITALS — BP 108/76 | HR 76 | Ht 71.0 in | Wt 244.4 lb

## 2020-01-24 DIAGNOSIS — I4811 Longstanding persistent atrial fibrillation: Secondary | ICD-10-CM

## 2020-01-24 MED ORDER — WARFARIN SODIUM 5 MG PO TABS: 5.0000 mg | ORAL_TABLET | Freq: Every day | ORAL | 0 refills | Status: DC

## 2020-01-24 NOTE — Patient Instructions (Addendum)
Medication Instructions:  Your physician has recommended you make the following change in your medication:  1. START Coumadin 5 mg once daily  *If you need a refill on your cardiac medications before your next appointment, please call your pharmacy*   Lab Work: None ordered If you have labs (blood work) drawn today and your tests are completely normal, you will receive your results only by:  [facility] (if you have MyChart) OR  A paper copy in the mail If you have any lab test that is abnormal or we need to change your treatment, we will call you to review the results.   Testing/Procedures: None ordered   Follow-Up: Your physician recommends that you schedule a follow-up appointment in: 5-7 days with Coumadin clinic.  At [facility], you and your health needs are our priority.  As part of our continuing mission to provide you with exceptional heart care, we have created designated Provider Care Teams.  These Care Teams include your primary Cardiologist (physician) and Advanced Practice Providers (APPs -  Physician Assistants and Nurse Practitioners) who all work together to provide you with the care you need, when you need it.  We recommend signing up for the patient portal called "MyChart".  Sign up information is provided on this After Visit Summary.  MyChart is used to connect with patients for Virtual Visits (Telemedicine).  Patients are able to view lab/test results, encounter notes, upcoming appointments, etc.  Non-urgent messages can be sent to your provider as well.   To learn more about what you can do with MyChart, go to [email or web address].    Your next appointment:   6 month(s)  The format for your next appointment:   In Person  Provider:   Allegra Lai, MD   Thank you for choosing [facility]!!   Trinidad Curet, RN [phone]    Other Instructions   Vitamin K Foods and Warfarin Warfarin is a blood thinner (anticoagulant). Anticoagulant  medicines help prevent the formation of blood clots. These medicines work by decreasing the activity of vitamin K, which promotes normal blood clotting. When you take warfarin, problems can occur from suddenly increasing or decreasing the amount of vitamin K that you eat from one day to the next. Problems may include:  Blood clots.  Bleeding. What general guidelines do I need to follow? To avoid problems when taking warfarin:  Eat a balanced diet that includes: ? Fresh fruits and vegetables. ? Whole grains. ? Low-fat dairy products. ? Lean proteins, such as fish, eggs, and lean cuts of meat.  Keep your intake of vitamin K consistent from day to day. To do this: ? Avoid eating large amounts of vitamin K one day and low amounts of vitamin K the next day. ? If you take a multivitamin that contains vitamin K, be sure to take it every day. ? Know which foods contain vitamin K. Use the lists below to understand serving sizes and the amount of vitamin K in one serving.  Avoid major changes in your diet. If you are going to change your diet, talk with your health care provider before making changes.  Work with a Financial planner (dietitian) to develop a meal plan that works best for you.  High vitamin K foods Foods that are high in vitamin K contain more than 100 mcg (micrograms) per serving. These include:  Broccoli (cooked) -  cup has 110 mcg.  Brussels sprouts (cooked) -  cup has 109 mcg.  Greens, beet (cooked) -  cup has 350 mcg.  Greens, collard (cooked) -  cup has 418 mcg.  Greens, turnip (cooked) -  cup has 265 mcg.  Green onions or scallions -  cup has 105 mcg.  Kale (fresh or frozen) -  cup has 531 mcg.  Parsley (raw) - 10 sprigs has 164 mcg.  Spinach (cooked) -  cup has 444 mcg.  Swiss chard (cooked) -  cup has 287 mcg. Moderate vitamin K foods Foods that have a moderate amount of vitamin K contain 25-100 mcg per serving. These include:  Asparagus  (cooked) - 5 spears have 38 mcg.  Black-eyed peas (dried) -  cup has 32 mcg.  Cabbage (cooked) -  cup has 37 mcg.  Kiwi fruit - 1 medium has 31 mcg.  Lettuce - 1 cup has 57-63 mcg.  Okra (frozen) -  cup has 44 mcg.  Prunes (dried) - 5 prunes have 25 mcg.  Watercress (raw) - 1 cup has 85 mcg. Low vitamin K foods Foods low in vitamin K contain less than 25 mcg per serving. These include:  Artichoke - 1 medium has 18 mcg.  Avocado - 1 oz. has 6 mcg.  Blueberries -  cup has 14 mcg.  Cabbage (raw) -  cup has 21 mcg.  Carrots (cooked) -  cup has 11 mcg.  Cauliflower (raw) -  cup has 11 mcg.  Cucumber with peel (raw) -  cup has 9 mcg.  Grapes -  cup has 12 mcg.  Mango - 1 medium has 9 mcg.  Nuts - 1 oz. has 15 mcg.  Pear - 1 medium has 8 mcg.  Peas (cooked) -  cup has 19 mcg.  Pickles - 1 spear has 14 mcg.  Pumpkin seeds - 1 oz. has 13 mcg.  Sauerkraut (canned) -  cup has 16 mcg.  Soybeans (cooked) -  cup has 16 mcg.  Tomato (raw) - 1 medium has 10 mcg.  Tomato sauce -  cup has 17 mcg. Vitamin K-free foods If a food contain less than 5 mcg per serving, it is considered to have no vitamin K. These foods include:  Bread and cereal products.  Cheese.  Eggs.  Fish and shellfish.  Meat and poultry.  Milk and dairy products.  Sunflower seeds. Actual amounts of vitamin K in foods may be different depending on processing. Talk with your dietitian about what foods you can eat and what foods you should avoid. This information is not intended to replace advice given to you by your health care provider. Make sure you discuss any questions you have with your health care provider. Document Revised: 10/09/2017 Document Reviewed: 01/30/2016 Elsevier Patient Education  [place].   Warfarin tablets What is this medicine? WARFARIN (WAR far in) is an anticoagulant. It is used to treat or prevent clots in the veins, arteries, lungs, or  heart. This medicine may be used for other purposes; ask your health care provider or pharmacist if you have questions. COMMON BRAND NAME(S): Coumadin, Jantoven What should I tell my health care provider before I take this medicine? They need to know if you have any of these conditions:  alcoholism  anemia  bleeding disorders  cancer  diabetes  heart disease  high blood pressure  history of bleeding in the gastrointestinal tract  history of stroke or other brain injury or disease  kidney or liver disease  protein C deficiency  protein S deficiency  psychosis or dementia  recent injury, recent or planned surgery  or procedure  an unusual or allergic reaction to warfarin, other medicines, foods, dyes, or preservatives  pregnant or trying to get pregnant  breast-feeding How should I use this medicine? Take this medicine by mouth with a glass of water. Follow the directions on the prescription label. You can take this medicine with or without food. Take your medicine at the same time each day. Do not take it more often than directed. Do not stop taking except on your doctor's advice. Stopping this medicine may increase your risk of a blood clot. Be sure to refill your prescription before you run out of medicine. If your doctor or healthcare professional calls to change your dose, write down the dose and any other instructions. Always read the dose and instructions back to him or her to make sure you understand them. Tell your doctor or healthcare professional what strength of tablets you have on hand. Ask how many tablets you should take to equal your new dose. Write the date on the new instructions and keep them near your medicine. If you are told to stop taking your medicine until your next blood test, call your doctor or healthcare professional if you do not hear anything within 24 hours of the test to find out your new dose or when to restart your prior dose. A special  MedGuide will be given to you by the pharmacist with each prescription and refill. Be sure to read this information carefully each time. Talk to your pediatrician regarding the use of this medicine in children. Special care may be needed. Overdosage: If you think you have taken too much of this medicine contact a poison control center or emergency room at once. NOTE: This medicine is only for you. Do not share this medicine with others. What if I miss a dose? It is important not to miss a dose. If you miss a dose, call your healthcare provider. Take the dose as soon as possible on the same day. If it is almost time for your next dose, take only that dose. Do not take double or extra doses to make up for a missed dose. What may interact with this medicine? Do not take this medicine with any of the following medications:  agents that prevent or dissolve blood clots  aspirin or other salicylates  danshen  dextrothyroxine  mifepristone  St. John's Wort  red yeast rice This medicine may also interact with the following medications:  acetaminophen  agents that lower cholesterol  alcohol  allopurinol  amiodarone  antibiotics or medicines for treating bacterial, fungal or viral infections  azathioprine  barbiturate medicines for inducing sleep or treating seizures  certain medicines for diabetes  certain medicines for heart rhythm problems  certain medicines for hepatitis C virus infections like daclatasvir, dasabuvir; ombitasvir; paritaprevir; ritonavir, elbasvir; grazoprevir, ledipasvir; sofosbuvir, simeprevir, sofosbuvir, sofosbuvir; velpatasvir, sofosbuvir; velpatasvir; voxilaprevir  certain medicines for high blood pressure  chloral hydrate  cisapride  conivaptan  disulfiram  male hormones, including contraceptive or birth control pills  general anesthetics  herbal or dietary products like garlic, ginkgo, ginseng, green tea, or kava kava  influenza virus  vaccine  male hormones  medicines for mental depression or psychosis  medicines for some types of cancer  medicines for stomach problems  methylphenidate  NSAIDs, medicines for pain and inflammation, like ibuprofen or naproxen  propoxyphene  quinidine, quinine  raloxifene  seizure or epilepsy medicine like carbamazepine, phenytoin, and valproic acid  steroids like cortisone and prednisone  tamoxifen  thyroid medicine  tramadol  vitamin c, vitamin e, and vitamin K  zafirlukast  zileuton This list may not describe all possible interactions. Give your health care provider a list of all the medicines, herbs, non-prescription drugs, or dietary supplements you use. Also tell them if you smoke, drink alcohol, or use illegal drugs. Some items may interact with your medicine. What should I watch for while using this medicine? Visit your healthcare professional for regular checks on your progress. You will need to have a blood test called a PT/INR regularly. The PT/INR blood test is done to make sure you are getting the right dose of this medicine. It is important to not miss your appointment for the blood tests. When you first start taking this medicine, these tests are done often. Once the correct dose is determined and you take your medicine properly, these tests can be done less often. Wear a medical ID bracelet or chain, and carry a card that describes your disease and details of your medicine and dosage times. Do not start taking or stop taking any medicines or over-the-counter medicines except on the advice of your healthcare professional. You should discuss your diet with your healthcare professional. Do not make major changes in your diet. Vitamin K can affect how well this medicine works. Many foods contain vitamin K. It is important to eat a consistent amount of foods with vitamin K. Other foods with vitamin K that you should eat in consistent amounts are asparagus, basil,  black-eyed peas, broccoli, brussel sprouts, cabbage, green onions, green tea, parsley, green leafy vegetables like beet greens, collard greens, kale, spinach, turnip greens, or certain lettuces like green leaf or romaine. This medicine can cause birth defects or bleeding in an unborn child. Women of childbearing age should use effective birth control while taking this medicine. If a woman becomes pregnant while taking this medicine, she should discuss the potential risks and her options with her healthcare professional. Avoid sports and activities that might cause injury while you are using this medicine. Severe falls or injuries can cause unseen bleeding. Be careful when using sharp tools or knives. Consider using an Copy. Take special care brushing or flossing your teeth. Report any injuries, bruising, or red spots on the skin to your healthcare professional. If you have an illness that causes vomiting, diarrhea, or fever for more than a few days, contact your health care professional. Also, check with your healthcare professional if you are unable to eat for several days. These problems can change the effect of this medicine. Even after you stop taking this medicine, it takes several days before your body recovers its normal ability to clot blood. Ask your healthcare professional how long you need to be careful. If you are going to have surgery or dental work, tell your health care professional that you have been taking this medicine. What side effects may I notice from receiving this medicine? Side effects that you should report to your doctor or health care professional as soon as possible:  allergic reactions like skin rash, itching or hives, swelling of the face, lips, or tongue  heavy menstrual bleeding or vaginal bleeding  painful, blue or purple toes  painful skin ulcers that do not go away  signs and symptoms of bleeding such as bloody or black, tarry stools; red or dark-brown  urine; spitting up blood or brown material that looks like coffee grounds; red spots on the skin; unusual bruising or bleeding from the eye, gums, or  nose  signs and symptoms of a blood clot such as chest pain; shortness of breath; pain, swelling, or warmth in the leg  signs and symptoms of a stroke such as changes in vision; confusion; trouble speaking or understanding; severe headaches; sudden numbness or weakness of the face, arm or leg; trouble walking; dizziness; loss of coordination  stomach pain  unusually weak or tired Side effects that usually do not require medical attention (report to your doctor or health care professional if they continue or are bothersome):  diarrhea  hair loss This list may not describe all possible side effects. Call your doctor for medical advice about side effects. You may report side effects to FDA at 1-800-FDA-1088. Where should I keep my medicine? Keep out of the reach of children. Store at room temperature between 15 and 30 degrees C (59 and 86 degrees F). Protect from light. Throw away any unused medicine after the expiration date. Do not flush down the toilet. NOTE: This sheet is a summary. It may not cover all possible information. If you have questions about this medicine, talk to your doctor, pharmacist, or health care provider.  2020 Elsevier/Gold Standard (2017-08-12 13:06:45)

## 2020-01-24 NOTE — Progress Notes (Signed)
Electrophysiology Office Note   Date:  01/24/2020   ID:  Troy [name], DOB 28-Nov-1950, MRN [id number]  PCP:  Troy Hatchet, MD  Cardiologist:  Troy [name] Primary Electrophysiologist:  Troy Etzkorn Meredith Leeds, MD    No chief complaint on file.    History of Present Illness: Troy [name] is a 69 y.o. male who is being seen today for the evaluation of atrial fibrillation at the request of Troy Hatchet, MD. Presenting today for electrophysiology evaluation. H/o 06/2015 DES mLAD &RPDA w/ VF arrest, PCI in [place] 2010, HTN, HLD, MR, PAF, pulm nodule stable 07/2016. He was recently loaded on amiodarone and  successfully cardioverted to SR around 50 bpm. Patient went back into AF on 02/13/17. His main complaint was shortness of breath. Had repeat cardioversion 02/20/17.  He had shortness of breath on amiodarone which has since been stopped.  Today, denies symptoms of palpitations, chest pain, shortness of breath, orthopnea, PND, lower extremity edema, claudication, dizziness, presyncope, syncope, bleeding, or neurologic sequela. The patient is tolerating medications without difficulties.  Overall he is done well.  He has started going back to the gym and has tolerated exercise well.  Since exercising, he is felt better with more energy.  At this point he is not interested in AF ablation.   Past Medical History:  Diagnosis Date  . Arthritis    "knees" (01/02/2017)  . Ascending aorta dilatation (HCC)    5mm by echo 05/2018, 38mm by CT 2017 and 12mm by CT 2019  . CAD (coronary artery disease)    a. s/p PCI in 2010 in [place]. b. Unstable angina/LHC [id number] complicated by V fib arrest after contrast injection. Cath 06/19/2015 s/p DES to mid LAD and RPDA. c. 11/2016: chest pain/ abnormal nuclear stress test prompting cath 11/13/16 showing 80% ostial diag, 60% mid RCA, no acute lesions, medical therapy recommended.  . Dyslipidemia   . Essential hypertension   . Lung nodule < 6cm on CT 06/16/15   Right  lung  . Mitral regurgitation    a. Mild-mod by echo 06/2015. b. not seen other than trivial in 2018.  . OSA on CPAP   . Persistent atrial fibrillation (Troy [name])    a. 06/2015 noted to be in new a-fib when arrived with unstable angina. Converted to NSR after being shocked in the cath lab for vfib;  b. 06/2015 Eliquis initiated as PAF noted on event monitor. c. Recurrent atrial fib?flutter 10/2016, issues with recurrent AF requiring multiple DCCVs in 02/2017. Failed sotalol, not candidate for Tikosyn due to cost/QTC, Multaq not felt likely strong enough.  . Pulmonary nodule 07/2015   a. 1.5 x 1.2 cm smoothly marginated nodule in the central aspect of the right lower lobe with recommendation correlation with nonemergent PET-CT to exclude a neoplasm , stable by CT 07/2016  . Severe left ventricular hypertrophy   . Ventricular fibrillation ([place])    a. occured during cath 06/18/2015.   Past Surgical History:  Procedure Laterality Date  . CARDIAC CATHETERIZATION N/A 06/18/2015   Procedure: Left Heart Cath and Coronary Angiography;  Surgeon: Troy Blanks, MD;  Location: [facility];  Service: Cardiovascular;  Laterality: N/A;  . CARDIAC CATHETERIZATION N/A 06/19/2015   Procedure: Coronary Stent Intervention;  Surgeon: Troy M Martinique, MD;  Location: [place] [facility];  Service: Cardiovascular;  Laterality: N/A;  . CARDIAC CATHETERIZATION N/A 11/13/2016   Procedure: Left Heart Cath and Coronary Angiography;  Surgeon: Troy Harp, MD;  Location: [place] [facility];  Service: Cardiovascular;  Laterality: N/A;  . CARDIOVERSION N/A 11/17/2016   Procedure: CARDIOVERSION;  Surgeon: Troy Dresser, MD;  Location: [facility];  Service: Cardiovascular;  Laterality: N/A;  . CARDIOVERSION N/A 01/03/2017   Procedure: CARDIOVERSION;  Surgeon: Troy Perla, MD;  Location: [facility];  Service: Cardiovascular;  Laterality: N/A;  . CARDIOVERSION N/A 02/05/2017   Procedure: Cardioversion;  Surgeon: Troy Lance, MD;  Location: [place];  Service: Cardiovascular;  Laterality: N/A;  . CARDIOVERSION N/A 02/20/2017   Procedure: Cardioversion;  Surgeon: Troy Lance, MD;  Location: [place] [facility];  Service: Cardiovascular;  Laterality: N/A;  . CARTILAGE SURGERY Bilateral    "thumbs"  . CATARACT EXTRACTION W/ INTRAOCULAR LENS  IMPLANT, BILATERAL Bilateral   . CORONARY ANGIOPLASTY WITH STENT PLACEMENT  ~ 2007  . JOINT REPLACEMENT    . KNEE ARTHROSCOPY Right   . RETINAL DETACHMENT SURGERY Bilateral    "laser thing"  . TEE WITHOUT CARDIOVERSION N/A 11/17/2016   Procedure: TRANSESOPHAGEAL ECHOCARDIOGRAM (TEE);  Surgeon: Troy Dresser, MD;  Location: [facility];  Service: Cardiovascular;  Laterality: N/A;  . TOTAL KNEE ARTHROPLASTY Left 2000s  . TOTAL KNEE ARTHROPLASTY Right 02/08/2018   Procedure: RIGHT TOTAL KNEE ARTHROPLASTY;  Surgeon: Troy Arabian, MD;  Location: [facility];  Service: Orthopedics;  Laterality: Right;  with block     Current Outpatient Medications  Medication Sig Dispense Refill  . atorvastatin (LIPITOR) 80 MG tablet Take 1 tablet (80 mg total) by mouth daily. 90 tablet 3  . ezetimibe (ZETIA) 10 MG tablet TAKE 1 TABLET(10 MG) BY MOUTH DAILY 90 tablet 0  . gabapentin (NEURONTIN) 300 MG capsule Take 1 capsule (300 mg total) by mouth 3 (three) times daily. Gabapentin 300 mg Protocol Take a 300 mg capsule three times a day for two weeks, Then a 300 mg capsule twice a day for two weeks, Then a 300 mg capsule once a day for two weeks, then discontinue the Gabapentin. 84 capsule 0  . LORazepam (ATIVAN) 0.5 MG tablet Take 0.5 mg by mouth daily as needed for anxiety.    Marland Kitchen losartan (COZAAR) 100 MG tablet TAKE 1 TABLET(100 MG) BY MOUTH DAILY 90 tablet 2  . methocarbamol (ROBAXIN) 500 MG tablet Take 1 tablet (500 mg total) by mouth every 6 (six) hours as needed for muscle spasms. 80 tablet 0  . metoprolol tartrate (LOPRESSOR) 100 MG tablet TAKE 1 TABLET(100 MG) BY MOUTH TWICE  DAILY 180 tablet 2  . nitroGLYCERIN (NITROSTAT) 0.3 MG SL tablet Place 1 tablet (0.3 mg total) under the tongue every 5 (five) minutes as needed for chest pain. 90 tablet 6  . oxymetazoline (AFRIN) 0.05 % nasal spray Place 1 spray into both nostrils 2 (two) times daily as needed (FOR SINUS CONGESTION.). 30 mL 0  . spironolactone (ALDACTONE) 25 MG tablet TAKE 1 TABLET(25 MG) BY MOUTH DAILY 30 tablet 11   No current facility-administered medications for this visit.    Allergies:   Gluten meal, Iodinated diagnostic agents, Metrizamide, Tizanidine, Whey, Morphine and related, Apple, Tizanidine hcl, and Wheat bran   Social History:  The patient  reports that he has never smoked. He has never used smokeless tobacco. He reports current alcohol use of about 2.0 standard drinks of alcohol per week. He reports that he does not use drugs.   Family History:  The patient's family history includes Diabetes in his sister; Drug abuse in his father; Emphysema in his mother; Heart attack in his  father, maternal grandfather, and paternal grandfather; Lung cancer in his mother; Stroke in his mother.   ROS:  Please see the history of present illness.   Otherwise, review of systems is positive for none.   All other systems are reviewed and negative.   PHYSICAL EXAM: VS:  BP 108/76   Pulse 76   Ht 5\' 11"  (1.803 m)   Wt 244 lb 6.4 oz (110.9 kg)   BMI 34.09 kg/m  , BMI Body mass index is 34.09 kg/m. GEN: Well nourished, well developed, in no acute distress  HEENT: normal  Neck: no JVD, carotid bruits, or masses Cardiac: irregular; no murmurs, rubs, or gallops,no edema  Respiratory:  clear to auscultation bilaterally, normal work of breathing GI: soft, nontender, nondistended, + BS MS: no deformity or atrophy  Skin: warm and dry Neuro:  Strength and sensation are intact Psych: euthymic mood, full affect  EKG:  EKG is ordered today. Personal review of the ekg ordered shows atrial fibrillation, rate  76   Recent Labs: No results found for requested labs within last 8760 hours.    Lipid Panel     Component Value Date/Time   CHOL 201 (H) 11/16/2017 1027   TRIG 178 (H) 11/16/2017 1027   HDL 49 11/16/2017 1027   CHOLHDL 4.1 11/16/2017 1027   CHOLHDL 2.5 08/15/2015 0912   VLDL 15 08/15/2015 0912   LDLCALC 116 (H) 11/16/2017 1027     Wt Readings from Last 3 Encounters:  01/24/20 244 lb 6.4 oz (110.9 kg)  08/01/19 243 lb (110.2 kg)  02/08/18 228 lb (103.4 kg)      Other studies Reviewed: Additional studies/ records that were reviewed today include: TTE 11/18/16  Review of the above records today demonstrates:  - Left ventricle: The cavity size was normal. There was severe   concentric hypertrophy. Systolic function was normal. The   estimated ejection fraction was in the range of 60% to 65%. Wall   motion was normal; there were no regional wall motion   abnormalities. The study is not technically sufficient to allow   evaluation of LV diastolic function. - Aortic valve: Trileaflet; mildly calcified leaflets. There was   mild regurgitation. - Mitral valve: Mildly thickened leaflets . There was trivial   regurgitation. - Left atrium: Moderately dilated. - Right ventricle: The cavity size was mildly dilated. - Right atrium: Severely dilated. - Tricuspid valve: There was trivial regurgitation. - Pulmonary arteries: PA peak pressure: 20 mm Hg (S). - Inferior vena cava: The vessel was normal in size. The   respirophasic diameter changes were in the normal range (= 50%),   consistent with normal central venous pressure.  LHC 11/13/16  Ost RPDA to RPDA lesion, 0 %stenosed.  Prox Cx to Mid Cx lesion, 0 %stenosed.  Prox LAD to Mid LAD lesion, 0 %stenosed.  Ost 1st Diag lesion, 80 %stenosed.  Mid RCA lesion, 60 %stenosed.  ASSESSMENT AND PLAN:  1.  Longstanding persistent atrial fibrillation: Currently not anticoagulated.  CHA2DS2-VASc of 3.  We Jonnathan Birman thus plan to start  him on Coumadin today.  At this point he is feeling well not having much in the way of symptoms from his atrial fibrillation.  He is also been longstanding persistent at this time.  Would hold off on anticoagulation as he is able to exercise without issue.   2. Hypertension: Currently well controlled  3. Coronary artery disease with angina: No chest pain.  Continue with current management.  4. Obstructive sleep apnea:  CPAP compliance encouraged  Current medicines are reviewed at length with the patient today.   The patient does not have concerns regarding his medicines.  The following changes were made today: None  Labs/ tests ordered today include:  Orders Placed This Encounter  Procedures  . EKG 12-Lead     Disposition:   FU with Sundus Pete 6 months  Signed, Darron Stuck Meredith Leeds, MD  01/24/2020 1:58 PM     [place] [address] [place] [address] [phone] (office) [phone] (fax)

## 2020-01-28 ENCOUNTER — Other Ambulatory Visit: Payer: Self-pay | Admitting: Cardiology

## 2020-01-30 ENCOUNTER — Other Ambulatory Visit: Payer: Self-pay | Admitting: Cardiology

## 2020-01-30 MED ORDER — METOPROLOL TARTRATE 100 MG PO TABS: ORAL_TABLET | ORAL | 3 refills | Status: DC

## 2020-01-31 ENCOUNTER — Other Ambulatory Visit: Payer: Self-pay

## 2020-01-31 ENCOUNTER — Ambulatory Visit (INDEPENDENT_AMBULATORY_CARE_PROVIDER_SITE_OTHER): Payer: Medicare Other | Admitting: *Deleted

## 2020-01-31 DIAGNOSIS — Z5181 Encounter for therapeutic drug level monitoring: Secondary | ICD-10-CM

## 2020-01-31 DIAGNOSIS — I4819 Other persistent atrial fibrillation: Secondary | ICD-10-CM

## 2020-01-31 LAB — POCT INR: INR: 1.2 — AB (ref 2.0–3.0)

## 2020-01-31 NOTE — Patient Instructions (Addendum)
A full discussion of the nature of anticoagulants has been carried out.  A benefit risk analysis has been presented to the patient, so that they understand the justification for choosing anticoagulation at this time. The need for frequent and regular monitoring, precise dosage adjustment and compliance is stressed.  Side effects of potential bleeding are discussed.  The patient should avoid any OTC items containing aspirin or ibuprofen, and should avoid great swings in general diet.  Avoid alcohol consumption.  Call if any signs of abnormal bleeding.  Description   Start taking warfarin 1 tablet daily except for 1.5 tablets on Tuesday, Thursday and Saturday. Recheck INR in 1 week. Call [phone] for any changes in medications or upcoming procedures.

## 2020-02-01 ENCOUNTER — Other Ambulatory Visit: Payer: Self-pay | Admitting: Pharmacist

## 2020-02-01 ENCOUNTER — Other Ambulatory Visit: Payer: Self-pay | Admitting: Cardiology

## 2020-02-01 MED ORDER — WARFARIN SODIUM 5 MG PO TABS: ORAL_TABLET | ORAL | 0 refills | Status: DC

## 2020-02-03 ENCOUNTER — Other Ambulatory Visit: Payer: Self-pay | Admitting: *Deleted

## 2020-02-03 MED ORDER — WARFARIN SODIUM 5 MG PO TABS: ORAL_TABLET | ORAL | 0 refills | Status: DC

## 2020-02-03 NOTE — Telephone Encounter (Signed)
Prescription refill for Warfarin sent earlier this week. However, pt called today and stated that he can not find his bottle of medication and he has looked for it. Sent in for another month supply.

## 2020-02-07 ENCOUNTER — Other Ambulatory Visit: Payer: Self-pay

## 2020-02-07 ENCOUNTER — Ambulatory Visit (INDEPENDENT_AMBULATORY_CARE_PROVIDER_SITE_OTHER): Payer: Medicare Other | Admitting: *Deleted

## 2020-02-07 DIAGNOSIS — I4819 Other persistent atrial fibrillation: Secondary | ICD-10-CM

## 2020-02-07 DIAGNOSIS — Z5181 Encounter for therapeutic drug level monitoring: Secondary | ICD-10-CM | POA: Diagnosis not present

## 2020-02-07 LAB — POCT INR: INR: 1.4 — AB (ref 2.0–3.0)

## 2020-02-07 NOTE — Patient Instructions (Signed)
Description   Take 2 tablets today, then start taking warfarin 1.5 tablet daily except for 1 tablet on Sundays. Recheck INR in 1 week. Call [phone] for any changes in medications or upcoming procedures.

## 2020-02-08 DIAGNOSIS — Z961 Presence of intraocular lens: Secondary | ICD-10-CM | POA: Diagnosis not present

## 2020-02-08 DIAGNOSIS — H532 Diplopia: Secondary | ICD-10-CM | POA: Diagnosis not present

## 2020-02-08 DIAGNOSIS — H4911 Fourth [trochlear] nerve palsy, right eye: Secondary | ICD-10-CM | POA: Diagnosis not present

## 2020-02-14 ENCOUNTER — Other Ambulatory Visit: Payer: Self-pay

## 2020-02-14 ENCOUNTER — Ambulatory Visit (INDEPENDENT_AMBULATORY_CARE_PROVIDER_SITE_OTHER): Payer: Medicare Other | Admitting: *Deleted

## 2020-02-14 DIAGNOSIS — Z5181 Encounter for therapeutic drug level monitoring: Secondary | ICD-10-CM

## 2020-02-14 DIAGNOSIS — I4819 Other persistent atrial fibrillation: Secondary | ICD-10-CM

## 2020-02-14 LAB — POCT INR: INR: 1.7 — AB (ref 2.0–3.0)

## 2020-02-14 NOTE — Patient Instructions (Signed)
Description   Take an extra 1/2 tablet today, then start taking warfarin 1.5 tablets daily except for 2 tablets on Saturdays. Recheck INR in 1 week. Call [phone] for any changes in medications or upcoming procedures.

## 2020-02-21 ENCOUNTER — Other Ambulatory Visit: Payer: Self-pay | Admitting: Cardiology

## 2020-02-21 ENCOUNTER — Ambulatory Visit (INDEPENDENT_AMBULATORY_CARE_PROVIDER_SITE_OTHER): Payer: Medicare Other | Admitting: *Deleted

## 2020-02-21 ENCOUNTER — Other Ambulatory Visit: Payer: Self-pay

## 2020-02-21 DIAGNOSIS — I4819 Other persistent atrial fibrillation: Secondary | ICD-10-CM | POA: Diagnosis not present

## 2020-02-21 DIAGNOSIS — Z5181 Encounter for therapeutic drug level monitoring: Secondary | ICD-10-CM | POA: Diagnosis not present

## 2020-02-21 LAB — POCT INR: INR: 2.2 (ref 2.0–3.0)

## 2020-02-21 MED ORDER — WARFARIN SODIUM 5 MG PO TABS: ORAL_TABLET | ORAL | 1 refills | Status: DC

## 2020-02-21 NOTE — Telephone Encounter (Signed)
Refill sent today while in the office to the requested pharmacy. Confirmed with pharmacy that they have the RX

## 2020-02-21 NOTE — Patient Instructions (Signed)
Description   Continue taking warfarin 1.5 tablets daily except for 2 tablets on Saturdays. Recheck INR in 1 week. Call [phone] for any changes in medications or upcoming procedures.

## 2020-02-21 NOTE — Telephone Encounter (Signed)
Please review for refill, Thanks !  

## 2020-02-28 ENCOUNTER — Other Ambulatory Visit: Payer: Self-pay

## 2020-02-28 ENCOUNTER — Ambulatory Visit (INDEPENDENT_AMBULATORY_CARE_PROVIDER_SITE_OTHER): Payer: Medicare Other | Admitting: *Deleted

## 2020-02-28 DIAGNOSIS — I4819 Other persistent atrial fibrillation: Secondary | ICD-10-CM

## 2020-02-28 DIAGNOSIS — Z5181 Encounter for therapeutic drug level monitoring: Secondary | ICD-10-CM | POA: Diagnosis not present

## 2020-02-28 LAB — POCT INR: INR: 2.7 (ref 2.0–3.0)

## 2020-02-28 NOTE — Patient Instructions (Signed)
Description   Continue taking warfarin 1.5 tablets daily except for 2 tablets on Saturdays. Recheck INR in 1 week. Call [phone] for any changes in medications or upcoming procedures.

## 2020-03-01 DIAGNOSIS — S39012A Strain of muscle, fascia and tendon of lower back, initial encounter: Secondary | ICD-10-CM | POA: Diagnosis not present

## 2020-03-01 DIAGNOSIS — M545 Low back pain: Secondary | ICD-10-CM | POA: Diagnosis not present

## 2020-03-01 DIAGNOSIS — Z7901 Long term (current) use of anticoagulants: Secondary | ICD-10-CM | POA: Diagnosis not present

## 2020-03-01 DIAGNOSIS — I4891 Unspecified atrial fibrillation: Secondary | ICD-10-CM | POA: Diagnosis not present

## 2020-03-06 ENCOUNTER — Ambulatory Visit (INDEPENDENT_AMBULATORY_CARE_PROVIDER_SITE_OTHER): Payer: Medicare Other | Admitting: *Deleted

## 2020-03-06 ENCOUNTER — Other Ambulatory Visit: Payer: Self-pay

## 2020-03-06 DIAGNOSIS — I4819 Other persistent atrial fibrillation: Secondary | ICD-10-CM

## 2020-03-06 DIAGNOSIS — Z5181 Encounter for therapeutic drug level monitoring: Secondary | ICD-10-CM

## 2020-03-06 LAB — POCT INR: INR: 2.6 (ref 2.0–3.0)

## 2020-03-06 NOTE — Patient Instructions (Addendum)
Description   Continue taking warfarin 1.5 tablets daily except for 2 tablets on Saturdays. Recheck INR in 2 weeks. Call [phone] for any changes in medications or upcoming procedures.

## 2020-03-20 ENCOUNTER — Other Ambulatory Visit: Payer: Self-pay

## 2020-03-20 ENCOUNTER — Ambulatory Visit (INDEPENDENT_AMBULATORY_CARE_PROVIDER_SITE_OTHER): Payer: Medicare Other | Admitting: *Deleted

## 2020-03-20 DIAGNOSIS — Z5181 Encounter for therapeutic drug level monitoring: Secondary | ICD-10-CM

## 2020-03-20 DIAGNOSIS — I4819 Other persistent atrial fibrillation: Secondary | ICD-10-CM | POA: Diagnosis not present

## 2020-03-20 LAB — POCT INR: INR: 2.8 (ref 2.0–3.0)

## 2020-03-20 NOTE — Patient Instructions (Signed)
Description   Continue taking warfarin 1.5 tablets daily except for 2 tablets on Saturdays. Recheck INR in 3 weeks. Call [phone] for any changes in medications or upcoming procedures.

## 2020-03-30 ENCOUNTER — Telehealth: Payer: Medicare Other | Admitting: Cardiology

## 2020-04-10 ENCOUNTER — Other Ambulatory Visit: Payer: Self-pay

## 2020-04-10 ENCOUNTER — Ambulatory Visit (INDEPENDENT_AMBULATORY_CARE_PROVIDER_SITE_OTHER): Payer: Medicare Other | Admitting: *Deleted

## 2020-04-10 DIAGNOSIS — Z5181 Encounter for therapeutic drug level monitoring: Secondary | ICD-10-CM | POA: Diagnosis not present

## 2020-04-10 DIAGNOSIS — I4819 Other persistent atrial fibrillation: Secondary | ICD-10-CM

## 2020-04-10 LAB — POCT INR: INR: 3 (ref 2.0–3.0)

## 2020-04-10 NOTE — Patient Instructions (Signed)
Description   Continue taking warfarin 1.5 tablets daily except for 2 tablets on Saturdays. Recheck INR in 4 weeks. Call [phone] for any changes in medications or upcoming procedures.

## 2020-04-14 ENCOUNTER — Other Ambulatory Visit: Payer: Self-pay | Admitting: Cardiology

## 2020-04-19 ENCOUNTER — Ambulatory Visit: Payer: Medicare Other | Admitting: Emergency Medicine

## 2020-04-19 ENCOUNTER — Encounter: Payer: Self-pay | Admitting: Emergency Medicine

## 2020-04-19 ENCOUNTER — Ambulatory Visit (INDEPENDENT_AMBULATORY_CARE_PROVIDER_SITE_OTHER): Payer: Medicare Other | Admitting: Emergency Medicine

## 2020-04-19 ENCOUNTER — Other Ambulatory Visit: Payer: Self-pay

## 2020-04-19 DIAGNOSIS — R911 Solitary pulmonary nodule: Secondary | ICD-10-CM | POA: Diagnosis not present

## 2020-04-19 NOTE — Progress Notes (Signed)
Subjective:    Patient ID: Troy [name], male    DOB: 10/19/1951, 69 y.o.   MRN: [id number]  HPI 69 year old never smoker with an ascending aortic aneurysm, CAD, hypertension, OSA on CPAP, atrial fibrillation. He was previously followed in our office by Dr. Ashok Cordia last seen 07/2017.  He had a central RLL pulmonary nodule noted on CT chest 06/2015 that had no hypermetabolism on a subsequent PET September 2016.  This was followed with serial imaging, with stable on 07/20/2018.  His most recent scan was 07/27/2019 which I have reviewed, shows small right lower lobe perifissural central rounded nodule that is probably smaller than on previous films.  No new nodules or findings.  He denies any dyspnea.  He is active, exercises.  MDM: Reviewed all of his Ct's 2016 thru 2019 Reviewed office notes Dr Ashok Cordia 08/05/2017   Review of Systems  Past Medical History:  Diagnosis Date  . Arthritis    "knees" (01/02/2017)  . Ascending aorta dilatation (HCC)    82mm by echo 05/2018, 77mm by CT 2017 and 44mm by CT 2019  . CAD (coronary artery disease)    a. s/p PCI in 2010 in [place]. b. Unstable angina/LHC 7/0/3500 complicated by V fib arrest after contrast injection. Cath 06/19/2015 s/p DES to mid LAD and RPDA. c. 11/2016: chest pain/ abnormal nuclear stress test prompting cath 11/13/16 showing 80% ostial diag, 60% mid RCA, no acute lesions, medical therapy recommended.  . Dyslipidemia   . Essential hypertension   . Lung nodule < 6cm on CT 06/16/15   Right lung  . Mitral regurgitation    a. Mild-mod by echo 06/2015. b. not seen other than trivial in 2018.  . OSA on CPAP   . Persistent atrial fibrillation ([place])    a. 06/2015 noted to be in new a-fib when arrived with unstable angina. Converted to NSR after being shocked in the cath lab for vfib;  b. 06/2015 Eliquis initiated as PAF noted on event monitor. c. Recurrent atrial fib?flutter 10/2016, issues with recurrent AF requiring multiple DCCVs in 02/2017.  Failed sotalol, not candidate for Tikosyn due to cost/QTC, Multaq not felt likely strong enough.  . Pulmonary nodule 07/2015   a. 1.5 x 1.2 cm smoothly marginated nodule in the central aspect of the right lower lobe with recommendation correlation with nonemergent PET-CT to exclude a neoplasm , stable by CT 07/2016  . Severe left ventricular hypertrophy   . Ventricular fibrillation ([place])    a. occured during cath 06/18/2015.     Family History  Problem Relation Age of Onset  . Stroke Mother   . Emphysema Mother   . Lung cancer Mother   . Heart attack Father   . Drug abuse Father   . Heart attack Maternal Grandfather   . Heart attack Paternal Grandfather   . Diabetes Sister      Social History   Socioeconomic History  . Marital status: Divorced    Spouse name: Not on file  . Number of children: Not on file  . Years of education: Not on file  . Highest education level: Not on file  Occupational History  . Not on file  Tobacco Use  . Smoking status: Never Smoker  . Smokeless tobacco: Never Used  . Tobacco comment: Second-hand exposure through parents  Vaping Use  . Vaping Use: Never used  Substance and Sexual Activity  . Alcohol use: Yes    Alcohol/week: 2.0 standard drinks    Types: 2 Glasses of  wine per week  . Drug use: No  . Sexual activity: Yes  Other Topics Concern  . Not on file  Social History Narrative   Originally he is from [place], [place]. He moved to [facility] in 2013. He has a dog & cat at home. No bird or mold exposure. Has a hot tub but hasn't used it in 4 months. 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    Social Determinants of Health   Financial Resource Strain:   . Difficulty of Paying Living Expenses:   Food Insecurity:   . Worried About Charity fundraiser in the Last Year:   . Arboriculturist in the Last Year:   Transportation Needs:   . Film/video editor (Medical):   Marland Kitchen Lack of Transportation (Non-Medical):   Physical Activity:   . Days of Exercise per  Week:   . Minutes of Exercise per Session:   Stress:   . Feeling of Stress :   Social Connections:   . Frequency of Communication with Friends and Family:   . Frequency of Social Gatherings with Friends and Family:   . Attends Religious Services:   . Active Member of Clubs or Organizations:   . Attends Archivist Meetings:   Marland Kitchen Marital Status:   Intimate Partner Violence:   . Fear of Current or Ex-Partner:   . Emotionally Abused:   Marland Kitchen Physically Abused:   . Sexually Abused:      Allergies  Allergen Reactions  . Gluten Meal Other (See Comments)    REACTION: Celiac disease (severe stomach pain) REACTION: Celiac disease (severe stomach pain)   . Iodinated Diagnostic Agents Other (See Comments) and Anaphylaxis    V-Fib arrest during cardiac cath suspected d/t contrast dye in 2016 Cardiac arrest  . Metrizamide Other (See Comments)    V-Fib arrest during cardiac cath suspected d/t contrast dye in 2016  . Tizanidine Anxiety    Depression  . Whey Other (See Comments)    REACTION: Celiac disease (severe stomach pain)  . Morphine And Related Other (See Comments)    REACTION: "REALLY BAD STOMACH PAIN"  . Apple Diarrhea  . Tizanidine Hcl Anxiety    Depression  . Wheat Bran Other (See Comments)     Outpatient Medications Prior to Visit  Medication Sig Dispense Refill  . atorvastatin (LIPITOR) 80 MG tablet Take 1 tablet (80 mg total) by mouth daily. 90 tablet 3  . ezetimibe (ZETIA) 10 MG tablet TAKE 1 TABLET(10 MG) BY MOUTH DAILY 90 tablet 3  . gabapentin (NEURONTIN) 300 MG capsule Take 1 capsule (300 mg total) by mouth 3 (three) times daily. Gabapentin 300 mg Protocol Take a 300 mg capsule three times a day for two weeks, Then a 300 mg capsule twice a day for two weeks, Then a 300 mg capsule once a day for two weeks, then discontinue the Gabapentin. 84 capsule 0  . LORazepam (ATIVAN) 0.5 MG tablet Take 0.5 mg by mouth daily as needed for anxiety.    Marland Kitchen losartan (COZAAR)  100 MG tablet TAKE 1 TABLET(100 MG) BY MOUTH DAILY 90 tablet 2  . methocarbamol (ROBAXIN) 500 MG tablet Take 1 tablet (500 mg total) by mouth every 6 (six) hours as needed for muscle spasms. 80 tablet 0  . metoprolol tartrate (LOPRESSOR) 100 MG tablet TAKE 1 TABLET(100 MG) BY MOUTH TWICE DAILY 180 tablet 3  . nitroGLYCERIN (NITROSTAT) 0.3 MG SL tablet Place 1 tablet (0.3 mg total) under the tongue every 5 (  five) minutes as needed for chest pain. 90 tablet 6  . oxymetazoline (AFRIN) 0.05 % nasal spray Place 1 spray into both nostrils 2 (two) times daily as needed (FOR SINUS CONGESTION.). 30 mL 0  . spironolactone (ALDACTONE) 25 MG tablet TAKE 1 TABLET(25 MG) BY MOUTH DAILY 30 tablet 11  . warfarin (COUMADIN) 5 MG tablet TAKE AS DIRECTED BY THE COUMADIN CLINIC (Patient taking differently: 7.5 mg. TAKE AS DIRECTED BY THE COUMADIN CLINIC) 150 tablet 1   No facility-administered medications prior to visit.        Objective:   Physical Exam Vitals:   04/19/20 1520  BP: 114/68  Pulse: 87  Temp: 98.5 F (36.9 C)  TempSrc: Oral  SpO2: 97%  Weight: 243 lb (110.2 kg)  Height: 5\' 11"  (1.803 m)   Gen: Pleasant, well-nourished, in no distress,  normal affect  ENT: No lesions,  mouth clear,  oropharynx clear, no postnasal drip  Neck: No JVD, no stridor  Lungs: No use of accessory muscles, no crackles or wheezing on normal respiration, no wheeze on forced expiration  Cardiovascular: RRR, heart sounds normal, no murmur or gallops, no peripheral edema  Musculoskeletal: No deformities, no cyanosis or clubbing  Neuro: alert, awake, non focal  Skin: Warm, no lesions or rashe       Assessment & Plan:  Pulmonary nodule Small central right lower lobe pulmonary nodule that has been stable for 4 years.  Negative on original PET scan.  He does not need anymore follow-up scans unless there is some sort of clinical change.  I reassured him about this.  He will call to be seen if any new symptoms  develop.  Baltazar Apo, MD, PhD 04/19/2020, 3:59 PM [facility] [phone] or if no answer [phone]

## 2020-04-19 NOTE — Assessment & Plan Note (Signed)
Small central right lower lobe pulmonary nodule that has been stable for 4 years.  Negative on original PET scan.  He does not need anymore follow-up scans unless there is some sort of clinical change.  I reassured him about this.  He will call to be seen if any new symptoms develop.

## 2020-04-19 NOTE — Patient Instructions (Addendum)
Your CT scans of the chest have been stable from 06/16/2015 to 07/27/2019.  There is no evidence to support malignancy.  This is good news.  You should not need any repeat CT scans to follow your small pulmonary nodule. Please call our office in follow-up if you develop any problems with breathing or any constitutional problems such as fevers, sweats, unexplained weight loss, malaise, etc.

## 2020-04-24 ENCOUNTER — Ambulatory Visit: Payer: Medicare Other | Admitting: Internal Medicine

## 2020-04-27 ENCOUNTER — Other Ambulatory Visit: Payer: Self-pay | Admitting: Cardiology

## 2020-05-08 ENCOUNTER — Ambulatory Visit (INDEPENDENT_AMBULATORY_CARE_PROVIDER_SITE_OTHER): Payer: Medicare Other | Admitting: *Deleted

## 2020-05-08 ENCOUNTER — Other Ambulatory Visit: Payer: Self-pay

## 2020-05-08 DIAGNOSIS — Z5181 Encounter for therapeutic drug level monitoring: Secondary | ICD-10-CM | POA: Diagnosis not present

## 2020-05-08 DIAGNOSIS — I4819 Other persistent atrial fibrillation: Secondary | ICD-10-CM | POA: Diagnosis not present

## 2020-05-08 LAB — POCT INR: INR: 2.1 (ref 2.0–3.0)

## 2020-05-08 NOTE — Patient Instructions (Signed)
Description   Continue taking warfarin 1.5 tablets daily except for 2 tablets on Saturdays. Recheck INR in 6 weeks. Call [phone] for any changes in medications or upcoming procedures.

## 2020-06-25 ENCOUNTER — Other Ambulatory Visit: Payer: Self-pay

## 2020-06-25 ENCOUNTER — Ambulatory Visit (INDEPENDENT_AMBULATORY_CARE_PROVIDER_SITE_OTHER): Payer: Medicare Other | Admitting: Pharmacist

## 2020-06-25 DIAGNOSIS — Z5181 Encounter for therapeutic drug level monitoring: Secondary | ICD-10-CM

## 2020-06-25 DIAGNOSIS — I4819 Other persistent atrial fibrillation: Secondary | ICD-10-CM

## 2020-06-25 LAB — POCT INR: INR: 2.6 (ref 2.0–3.0)

## 2020-06-25 NOTE — Patient Instructions (Signed)
Continue taking warfarin 1.5 tablets daily except for 2 tablets on Saturdays. Recheck INR in 7 weeks. Call [phone] for any changes in medications or upcoming procedures.

## 2020-06-26 ENCOUNTER — Other Ambulatory Visit: Payer: Self-pay | Admitting: Cardiology

## 2020-07-24 ENCOUNTER — Encounter: Payer: Self-pay | Admitting: Dermatology

## 2020-07-24 ENCOUNTER — Other Ambulatory Visit: Payer: Self-pay

## 2020-07-24 ENCOUNTER — Ambulatory Visit (INDEPENDENT_AMBULATORY_CARE_PROVIDER_SITE_OTHER): Payer: Medicare Other | Admitting: Dermatology

## 2020-07-24 DIAGNOSIS — L570 Actinic keratosis: Secondary | ICD-10-CM | POA: Diagnosis not present

## 2020-07-24 DIAGNOSIS — D229 Melanocytic nevi, unspecified: Secondary | ICD-10-CM

## 2020-07-24 DIAGNOSIS — Z1283 Encounter for screening for malignant neoplasm of skin: Secondary | ICD-10-CM

## 2020-07-24 DIAGNOSIS — L821 Other seborrheic keratosis: Secondary | ICD-10-CM

## 2020-07-24 NOTE — Patient Instructions (Signed)
First visit for Troy [name] date of birth 1951-09-25.  General skin examination done.  There are no atypical moles or melanoma.  Some subtly textured tan spots on the right collarbone area represent benign keratoses and are safe to leave.  He has 1 moderately thick warty bump on the right wrist which was treated with 5 seconds liquid nitrogen freeze.  This may swell and peel in the next 2 weeks but he could play pickle ball today and there are no special bandages or bathing instructions.  He does have enough diffuse crusts on the arms as well as on the face that he is a candidate to do 28 applications of [place] sometime in the winter, so I will schedule him to return to discuss this possibility.  He knows he can call me if there is any problem in the interim.

## 2020-07-31 ENCOUNTER — Telehealth: Payer: Self-pay

## 2020-07-31 ENCOUNTER — Other Ambulatory Visit: Payer: Self-pay

## 2020-07-31 ENCOUNTER — Encounter: Payer: Self-pay | Admitting: Cardiology

## 2020-07-31 ENCOUNTER — Ambulatory Visit (HOSPITAL_COMMUNITY): Payer: Medicare Other | Attending: Cardiovascular Disease

## 2020-07-31 DIAGNOSIS — I340 Nonrheumatic mitral (valve) insufficiency: Secondary | ICD-10-CM

## 2020-07-31 DIAGNOSIS — G4730 Sleep apnea, unspecified: Secondary | ICD-10-CM | POA: Diagnosis not present

## 2020-07-31 DIAGNOSIS — I4891 Unspecified atrial fibrillation: Secondary | ICD-10-CM | POA: Insufficient documentation

## 2020-07-31 DIAGNOSIS — I712 Thoracic aortic aneurysm, without rupture: Secondary | ICD-10-CM | POA: Insufficient documentation

## 2020-07-31 DIAGNOSIS — I358 Other nonrheumatic aortic valve disorders: Secondary | ICD-10-CM | POA: Diagnosis not present

## 2020-07-31 DIAGNOSIS — I719 Aortic aneurysm of unspecified site, without rupture: Secondary | ICD-10-CM

## 2020-07-31 DIAGNOSIS — E785 Hyperlipidemia, unspecified: Secondary | ICD-10-CM | POA: Diagnosis not present

## 2020-07-31 DIAGNOSIS — Z8616 Personal history of COVID-19: Secondary | ICD-10-CM | POA: Diagnosis not present

## 2020-07-31 DIAGNOSIS — Z8249 Family history of ischemic heart disease and other diseases of the circulatory system: Secondary | ICD-10-CM | POA: Diagnosis not present

## 2020-07-31 DIAGNOSIS — I080 Rheumatic disorders of both mitral and aortic valves: Secondary | ICD-10-CM | POA: Diagnosis not present

## 2020-07-31 LAB — ECHOCARDIOGRAM COMPLETE
Area-P 1/2: 4.16 cm2
MV M vel: 4.42 m/s
MV Peak grad: 78.3 mmHg
P 1/2 time: 565 msec
Radius: 0.6 cm
S' Lateral: 3.2 cm

## 2020-07-31 NOTE — Telephone Encounter (Signed)
-----   Message from Sueanne Margarita, MD sent at 07/31/2020  4:04 PM EDT ----- Echo showed normal heart function with severely enlarged LA and RA, mild to moderate leakiness of MV and mildly leaky AV and moderate enlargement of ascending aorta at 110mm>>stable from a year ago.  Repeat echo in 1 year for MR and aortic dilatation

## 2020-07-31 NOTE — Telephone Encounter (Signed)
Left message for patient with results (ok per DPR). Advised to call back with any questions. Order placed for repeat echo in one year.

## 2020-08-14 ENCOUNTER — Other Ambulatory Visit: Payer: Self-pay

## 2020-08-14 ENCOUNTER — Ambulatory Visit (INDEPENDENT_AMBULATORY_CARE_PROVIDER_SITE_OTHER): Payer: Medicare Other | Admitting: *Deleted

## 2020-08-14 DIAGNOSIS — I4819 Other persistent atrial fibrillation: Secondary | ICD-10-CM | POA: Diagnosis not present

## 2020-08-14 DIAGNOSIS — Z5181 Encounter for therapeutic drug level monitoring: Secondary | ICD-10-CM | POA: Diagnosis not present

## 2020-08-14 LAB — POCT INR: INR: 2.7 (ref 2.0–3.0)

## 2020-08-14 NOTE — Patient Instructions (Signed)
Description   Continue taking warfarin 1.5 tablets daily except for 2 tablets on Saturdays. Recheck INR in 8 weeks. Call [phone] for any changes in medications or upcoming procedures.      

## 2020-08-19 ENCOUNTER — Encounter: Payer: Self-pay | Admitting: Dermatology

## 2020-08-19 NOTE — Progress Notes (Signed)
   New Patient   Subjective  Troy [name] is a 69 y.o. male who presents for the following: Annual Exam (NO CONCERNS SEEN LOCAL DERM 5 YEARS AGO DONT REMEMBER NAME NO REMOVALS THAT WERE CONCERNING.).  General skin examination Location:  Duration: New crust right forearm Quality:  Associated Signs/Symptoms: Modifying Factors:  Severity:  Timing: Context:    The following portions of the chart were reviewed this encounter and updated as appropriate: Tobacco  Allergies  Meds  Problems  Med Hx  Surg Hx  Fam Hx      Objective  Well appearing patient in no apparent distress; mood and affect are within normal limits.  A full examination was performed including scalp, head, eyes, ears, nose, lips, neck, chest, axillae, abdomen, back, buttocks, bilateral upper extremities, bilateral lower extremities, hands, feet, fingers, toes, fingernails, and toenails. All findings within normal limits unless otherwise noted below.   Assessment & Plan  AK (actinic keratosis) Right Forearm - Posterior  Destruction of lesion - Right Forearm - Posterior Complexity: simple   Destruction method: cryotherapy   Informed consent: discussed and consent obtained   Timeout:  patient name, date of birth, surgical site, and procedure verified Lesion destroyed using liquid nitrogen: Yes   Region frozen until ice ball extended beyond lesion: Yes   Cryotherapy cycles:  5 Outcome: patient tolerated procedure well with no complications    irst visit for Troy [name] date of birth February 18, 1951.  General skin examination done.  There are no atypical moles or melanoma.  Some subtly textured tan spots on the right collarbone area represent benign keratoses and are safe to leave.  He has 1 moderately thick warty bump on the right wrist which was treated with 5 seconds liquid nitrogen freeze.  This may swell and peel in the next 2 weeks but he could play pickle ball today and there are no special  bandages or bathing instructions.  He does have enough diffuse crusts on the arms as well as on the face that he is a candidate to do 28 applications of Tolak sometime in the winter, so I will schedule him to return to discuss this possibility.  He knows he can call me if there is any problem in the interim. Skin cancer screening performed today.

## 2020-09-15 DIAGNOSIS — Z23 Encounter for immunization: Secondary | ICD-10-CM | POA: Diagnosis not present

## 2020-09-27 DIAGNOSIS — M19022 Primary osteoarthritis, left elbow: Secondary | ICD-10-CM | POA: Diagnosis not present

## 2020-09-27 DIAGNOSIS — M25522 Pain in left elbow: Secondary | ICD-10-CM | POA: Diagnosis not present

## 2020-09-27 DIAGNOSIS — M25422 Effusion, left elbow: Secondary | ICD-10-CM | POA: Diagnosis not present

## 2020-10-08 DIAGNOSIS — M7712 Lateral epicondylitis, left elbow: Secondary | ICD-10-CM | POA: Diagnosis not present

## 2020-10-08 DIAGNOSIS — M19022 Primary osteoarthritis, left elbow: Secondary | ICD-10-CM | POA: Diagnosis not present

## 2020-10-08 DIAGNOSIS — M7702 Medial epicondylitis, left elbow: Secondary | ICD-10-CM | POA: Diagnosis not present

## 2020-10-09 ENCOUNTER — Other Ambulatory Visit: Payer: Self-pay

## 2020-10-09 ENCOUNTER — Ambulatory Visit (INDEPENDENT_AMBULATORY_CARE_PROVIDER_SITE_OTHER): Payer: Medicare Other

## 2020-10-09 DIAGNOSIS — Z5181 Encounter for therapeutic drug level monitoring: Secondary | ICD-10-CM | POA: Diagnosis not present

## 2020-10-09 DIAGNOSIS — I4819 Other persistent atrial fibrillation: Secondary | ICD-10-CM

## 2020-10-09 LAB — POCT INR: INR: 2.8 (ref 2.0–3.0)

## 2020-10-09 NOTE — Patient Instructions (Signed)
Description   Continue taking warfarin 1.5 tablets daily except for 2 tablets on Saturdays. Recheck INR in 8 weeks. Call [phone] for any changes in medications or upcoming procedures.

## 2020-10-10 ENCOUNTER — Other Ambulatory Visit: Payer: Self-pay | Admitting: Cardiology

## 2020-10-10 ENCOUNTER — Ambulatory Visit (INDEPENDENT_AMBULATORY_CARE_PROVIDER_SITE_OTHER): Payer: Medicare Other | Admitting: *Deleted

## 2020-10-10 DIAGNOSIS — L570 Actinic keratosis: Secondary | ICD-10-CM

## 2020-10-10 MED ORDER — TOLAK 4 % EX CREA: 1.0000 "application " | TOPICAL_CREAM | Freq: Every day | CUTANEOUS | 1 refills | Status: DC

## 2020-10-10 NOTE — Progress Notes (Signed)
Patient here for possible PDT but after some discussion he has decided to do the Tolak cream treatment for his arms and wait till after the holidays to do face. If he gets a good reaction with Tolak he may chose to treat his face. Patient is to call with an update in january or send [place] a mychart message on arm treatment.  Tolak sent to [place]. Patient will call with any problems or concerns.

## 2020-10-15 DIAGNOSIS — E785 Hyperlipidemia, unspecified: Secondary | ICD-10-CM | POA: Diagnosis not present

## 2020-10-15 DIAGNOSIS — Z125 Encounter for screening for malignant neoplasm of prostate: Secondary | ICD-10-CM | POA: Diagnosis not present

## 2020-10-15 DIAGNOSIS — R739 Hyperglycemia, unspecified: Secondary | ICD-10-CM | POA: Diagnosis not present

## 2020-10-22 DIAGNOSIS — F419 Anxiety disorder, unspecified: Secondary | ICD-10-CM | POA: Diagnosis not present

## 2020-10-22 DIAGNOSIS — E785 Hyperlipidemia, unspecified: Secondary | ICD-10-CM | POA: Diagnosis not present

## 2020-10-22 DIAGNOSIS — R911 Solitary pulmonary nodule: Secondary | ICD-10-CM | POA: Diagnosis not present

## 2020-10-22 DIAGNOSIS — L570 Actinic keratosis: Secondary | ICD-10-CM | POA: Diagnosis not present

## 2020-10-22 DIAGNOSIS — I4891 Unspecified atrial fibrillation: Secondary | ICD-10-CM | POA: Diagnosis not present

## 2020-10-22 DIAGNOSIS — M791 Myalgia, unspecified site: Secondary | ICD-10-CM | POA: Diagnosis not present

## 2020-10-22 DIAGNOSIS — D692 Other nonthrombocytopenic purpura: Secondary | ICD-10-CM | POA: Diagnosis not present

## 2020-10-22 DIAGNOSIS — Z0000 Encounter for general adult medical examination without abnormal findings: Secondary | ICD-10-CM | POA: Diagnosis not present

## 2020-10-22 DIAGNOSIS — I10 Essential (primary) hypertension: Secondary | ICD-10-CM | POA: Diagnosis not present

## 2020-10-22 DIAGNOSIS — D6869 Other thrombophilia: Secondary | ICD-10-CM | POA: Diagnosis not present

## 2020-10-22 DIAGNOSIS — I25118 Atherosclerotic heart disease of native coronary artery with other forms of angina pectoris: Secondary | ICD-10-CM | POA: Diagnosis not present

## 2020-10-22 DIAGNOSIS — G4733 Obstructive sleep apnea (adult) (pediatric): Secondary | ICD-10-CM | POA: Diagnosis not present

## 2020-10-24 DIAGNOSIS — Z885 Allergy status to narcotic agent status: Secondary | ICD-10-CM | POA: Diagnosis not present

## 2020-10-24 DIAGNOSIS — Z91041 Radiographic dye allergy status: Secondary | ICD-10-CM | POA: Diagnosis not present

## 2020-10-24 DIAGNOSIS — I4891 Unspecified atrial fibrillation: Secondary | ICD-10-CM | POA: Diagnosis not present

## 2020-11-14 ENCOUNTER — Other Ambulatory Visit: Payer: Self-pay

## 2020-11-14 ENCOUNTER — Encounter (HOSPITAL_COMMUNITY): Payer: Self-pay

## 2020-11-14 DIAGNOSIS — Z955 Presence of coronary angioplasty implant and graft: Secondary | ICD-10-CM

## 2020-11-14 DIAGNOSIS — Z66 Do not resuscitate: Secondary | ICD-10-CM | POA: Diagnosis not present

## 2020-11-14 DIAGNOSIS — D72829 Elevated white blood cell count, unspecified: Secondary | ICD-10-CM | POA: Diagnosis not present

## 2020-11-14 DIAGNOSIS — I2510 Atherosclerotic heart disease of native coronary artery without angina pectoris: Secondary | ICD-10-CM | POA: Diagnosis present

## 2020-11-14 DIAGNOSIS — I4821 Permanent atrial fibrillation: Secondary | ICD-10-CM | POA: Diagnosis present

## 2020-11-14 DIAGNOSIS — Z20822 Contact with and (suspected) exposure to covid-19: Secondary | ICD-10-CM | POA: Diagnosis present

## 2020-11-14 DIAGNOSIS — R6520 Severe sepsis without septic shock: Secondary | ICD-10-CM | POA: Diagnosis present

## 2020-11-14 DIAGNOSIS — I10 Essential (primary) hypertension: Secondary | ICD-10-CM | POA: Diagnosis present

## 2020-11-14 DIAGNOSIS — N179 Acute kidney failure, unspecified: Secondary | ICD-10-CM | POA: Diagnosis not present

## 2020-11-14 DIAGNOSIS — A419 Sepsis, unspecified organism: Secondary | ICD-10-CM | POA: Diagnosis not present

## 2020-11-14 DIAGNOSIS — R509 Fever, unspecified: Secondary | ICD-10-CM | POA: Diagnosis not present

## 2020-11-14 DIAGNOSIS — N3001 Acute cystitis with hematuria: Secondary | ICD-10-CM | POA: Diagnosis present

## 2020-11-14 DIAGNOSIS — Z79899 Other long term (current) drug therapy: Secondary | ICD-10-CM

## 2020-11-14 DIAGNOSIS — E785 Hyperlipidemia, unspecified: Secondary | ICD-10-CM | POA: Diagnosis present

## 2020-11-14 DIAGNOSIS — Z96653 Presence of artificial knee joint, bilateral: Secondary | ICD-10-CM | POA: Diagnosis present

## 2020-11-14 DIAGNOSIS — D696 Thrombocytopenia, unspecified: Secondary | ICD-10-CM | POA: Diagnosis present

## 2020-11-14 DIAGNOSIS — J9811 Atelectasis: Secondary | ICD-10-CM | POA: Diagnosis not present

## 2020-11-14 DIAGNOSIS — R000 Tachycardia, unspecified: Secondary | ICD-10-CM | POA: Diagnosis not present

## 2020-11-14 DIAGNOSIS — M4316 Spondylolisthesis, lumbar region: Secondary | ICD-10-CM | POA: Diagnosis not present

## 2020-11-14 DIAGNOSIS — E872 Acidosis: Secondary | ICD-10-CM | POA: Diagnosis not present

## 2020-11-14 DIAGNOSIS — Z7901 Long term (current) use of anticoagulants: Secondary | ICD-10-CM

## 2020-11-14 LAB — RESP PANEL BY RT-PCR (FLU A&B, COVID) ARPGX2
Influenza A by PCR: NEGATIVE
Influenza B by PCR: NEGATIVE
SARS Coronavirus 2 by RT PCR: NEGATIVE

## 2020-11-14 LAB — CBC WITH DIFFERENTIAL/PLATELET
Abs Immature Granulocytes: 0.12 10*3/uL — ABNORMAL HIGH (ref 0.00–0.07)
Basophils Absolute: 0 10*3/uL (ref 0.0–0.1)
Basophils Relative: 0 %
Eosinophils Absolute: 0 10*3/uL (ref 0.0–0.5)
Eosinophils Relative: 0 %
HCT: 49.2 % (ref 39.0–52.0)
Hemoglobin: 16.4 g/dL (ref 13.0–17.0)
Immature Granulocytes: 1 %
Lymphocytes Relative: 7 %
Lymphs Abs: 1.3 10*3/uL (ref 0.7–4.0)
MCH: 30.3 pg (ref 26.0–34.0)
MCHC: 33.3 g/dL (ref 30.0–36.0)
MCV: 90.9 fL (ref 80.0–100.0)
Monocytes Absolute: 2 10*3/uL — ABNORMAL HIGH (ref 0.1–1.0)
Monocytes Relative: 11 %
Neutro Abs: 14.6 10*3/uL — ABNORMAL HIGH (ref 1.7–7.7)
Neutrophils Relative %: 81 %
Platelets: 141 10*3/uL — ABNORMAL LOW (ref 150–400)
RBC: 5.41 MIL/uL (ref 4.22–5.81)
RDW: 13.3 % (ref 11.5–15.5)
WBC: 18.1 10*3/uL — ABNORMAL HIGH (ref 4.0–10.5)
nRBC: 0 % (ref 0.0–0.2)

## 2020-11-14 NOTE — ED Triage Notes (Addendum)
Pt reports chills, body aches, fever, and sweating for 2 days. Generalized abdominal pain and no BM for 2 days.

## 2020-11-15 ENCOUNTER — Inpatient Hospital Stay (HOSPITAL_COMMUNITY): Payer: Medicare Other

## 2020-11-15 ENCOUNTER — Emergency Department (HOSPITAL_COMMUNITY): Payer: Medicare Other

## 2020-11-15 ENCOUNTER — Inpatient Hospital Stay (HOSPITAL_COMMUNITY)
Admission: EM | Admit: 2020-11-15 | Discharge: 2020-11-17 | DRG: 872 | Disposition: A | Discharge status: Home / Self Care | Payer: Medicare Other | Attending: Family Medicine | Admitting: Family Medicine

## 2020-11-15 DIAGNOSIS — N3001 Acute cystitis with hematuria: Secondary | ICD-10-CM

## 2020-11-15 DIAGNOSIS — R000 Tachycardia, unspecified: Secondary | ICD-10-CM | POA: Diagnosis not present

## 2020-11-15 DIAGNOSIS — E872 Acidosis: Secondary | ICD-10-CM | POA: Diagnosis not present

## 2020-11-15 DIAGNOSIS — N179 Acute kidney failure, unspecified: Secondary | ICD-10-CM

## 2020-11-15 DIAGNOSIS — R6520 Severe sepsis without septic shock: Secondary | ICD-10-CM

## 2020-11-15 DIAGNOSIS — Z7901 Long term (current) use of anticoagulants: Secondary | ICD-10-CM | POA: Diagnosis not present

## 2020-11-15 DIAGNOSIS — Z96653 Presence of artificial knee joint, bilateral: Secondary | ICD-10-CM | POA: Diagnosis present

## 2020-11-15 DIAGNOSIS — I10 Essential (primary) hypertension: Secondary | ICD-10-CM | POA: Diagnosis present

## 2020-11-15 DIAGNOSIS — E785 Hyperlipidemia, unspecified: Secondary | ICD-10-CM | POA: Diagnosis present

## 2020-11-15 DIAGNOSIS — D696 Thrombocytopenia, unspecified: Secondary | ICD-10-CM | POA: Diagnosis present

## 2020-11-15 DIAGNOSIS — I2510 Atherosclerotic heart disease of native coronary artery without angina pectoris: Secondary | ICD-10-CM | POA: Diagnosis present

## 2020-11-15 DIAGNOSIS — R509 Fever, unspecified: Secondary | ICD-10-CM | POA: Diagnosis not present

## 2020-11-15 DIAGNOSIS — A419 Sepsis, unspecified organism: Secondary | ICD-10-CM | POA: Diagnosis present

## 2020-11-15 DIAGNOSIS — M4316 Spondylolisthesis, lumbar region: Secondary | ICD-10-CM | POA: Diagnosis not present

## 2020-11-15 DIAGNOSIS — K760 Fatty (change of) liver, not elsewhere classified: Secondary | ICD-10-CM | POA: Diagnosis not present

## 2020-11-15 DIAGNOSIS — J9811 Atelectasis: Secondary | ICD-10-CM | POA: Diagnosis not present

## 2020-11-15 DIAGNOSIS — N390 Urinary tract infection, site not specified: Secondary | ICD-10-CM | POA: Diagnosis not present

## 2020-11-15 DIAGNOSIS — D72829 Elevated white blood cell count, unspecified: Secondary | ICD-10-CM | POA: Diagnosis not present

## 2020-11-15 DIAGNOSIS — Z66 Do not resuscitate: Secondary | ICD-10-CM | POA: Diagnosis present

## 2020-11-15 DIAGNOSIS — Z79899 Other long term (current) drug therapy: Secondary | ICD-10-CM | POA: Diagnosis not present

## 2020-11-15 DIAGNOSIS — K7689 Other specified diseases of liver: Secondary | ICD-10-CM | POA: Diagnosis not present

## 2020-11-15 DIAGNOSIS — Z955 Presence of coronary angioplasty implant and graft: Secondary | ICD-10-CM | POA: Diagnosis not present

## 2020-11-15 DIAGNOSIS — Z20822 Contact with and (suspected) exposure to covid-19: Secondary | ICD-10-CM | POA: Diagnosis present

## 2020-11-15 DIAGNOSIS — I4821 Permanent atrial fibrillation: Secondary | ICD-10-CM | POA: Diagnosis present

## 2020-11-15 DIAGNOSIS — N3000 Acute cystitis without hematuria: Secondary | ICD-10-CM

## 2020-11-15 LAB — URINALYSIS, ROUTINE W REFLEX MICROSCOPIC
Bilirubin Urine: NEGATIVE
Glucose, UA: NEGATIVE mg/dL
Ketones, ur: 5 mg/dL — AB
Nitrite: NEGATIVE
Protein, ur: 100 mg/dL — AB
Specific Gravity, Urine: 1.033 — ABNORMAL HIGH (ref 1.005–1.030)
WBC, UA: >50 WBC/hpf — ABNORMAL HIGH (ref 0–5)
pH: 5 (ref 5.0–8.0)

## 2020-11-15 LAB — COMPREHENSIVE METABOLIC PANEL
ALT: 26 U/L (ref 0–44)
AST: 28 U/L (ref 15–41)
Albumin: 4.5 g/dL (ref 3.5–5.0)
Alkaline Phosphatase: 38 U/L (ref 38–126)
Anion gap: 13 (ref 5–15)
BUN: 18 mg/dL (ref 8–23)
CO2: 23 mmol/L (ref 22–32)
Calcium: 9.5 mg/dL (ref 8.9–10.3)
Chloride: 101 mmol/L (ref 98–111)
Creatinine, Ser: 1.3 mg/dL — ABNORMAL HIGH (ref 0.61–1.24)
GFR, Estimated: 59 mL/min — ABNORMAL LOW (ref >60)
Glucose, Bld: 141 mg/dL — ABNORMAL HIGH (ref 70–99)
Potassium: 3.9 mmol/L (ref 3.5–5.1)
Sodium: 137 mmol/L (ref 135–145)
Total Bilirubin: 2.6 mg/dL — ABNORMAL HIGH (ref 0.3–1.2)
Total Protein: 8.1 g/dL (ref 6.5–8.1)

## 2020-11-15 LAB — LIPASE, BLOOD: Lipase: 27 U/L (ref 11–51)

## 2020-11-15 LAB — BILIRUBIN, FRACTIONATED(TOT/DIR/INDIR)
Bilirubin, Direct: 0.3 mg/dL — ABNORMAL HIGH (ref 0.0–0.2)
Indirect Bilirubin: 1.5 mg/dL — ABNORMAL HIGH (ref 0.3–0.9)
Total Bilirubin: 1.8 mg/dL — ABNORMAL HIGH (ref 0.3–1.2)

## 2020-11-15 LAB — LACTIC ACID, PLASMA
Lactic Acid, Venous: 2.4 mmol/L (ref 0.5–1.9)
Lactic Acid, Venous: 2.6 mmol/L (ref 0.5–1.9)
Lactic Acid, Venous: 2.2 mmol/L (ref 0.5–1.9)
Lactic Acid, Venous: 1.4 mmol/L (ref 0.5–1.9)

## 2020-11-15 LAB — PROTIME-INR
INR: 1.7 — ABNORMAL HIGH (ref 0.8–1.2)
Prothrombin Time: 19.3 seconds — ABNORMAL HIGH (ref 11.4–15.2)

## 2020-11-15 IMAGING — CT CT ABD-PELV W/O CM
2 of 4 series · 16 of 46 positions shown, 18 images · non-contrast
Comparison: [DATE]

CLINICAL DATA: Abdominal pain, fever, leukocytosis, lactic acidosis

EXAM:
CT ABDOMEN AND PELVIS WITHOUT CONTRAST
TECHNIQUE: Multidetector CT imaging of the abdomen and pelvis was performed
following the standard protocol without IV contrast.

[Series 2: axial st · axial · 0.83mm/px · z∈[+1093,+1493]mm · 13 of 90 slices shown, 15 images]
[im 5/90  soft-tissue]
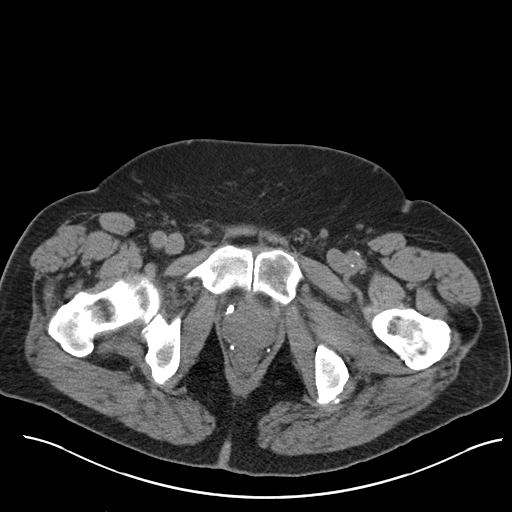
[im 5/90  bone]
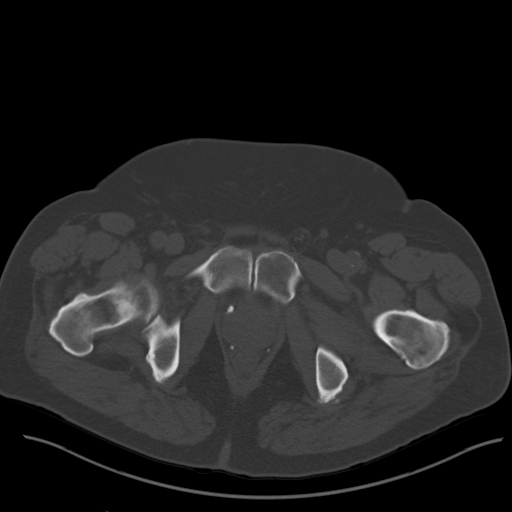
[im 14/90  soft-tissue]
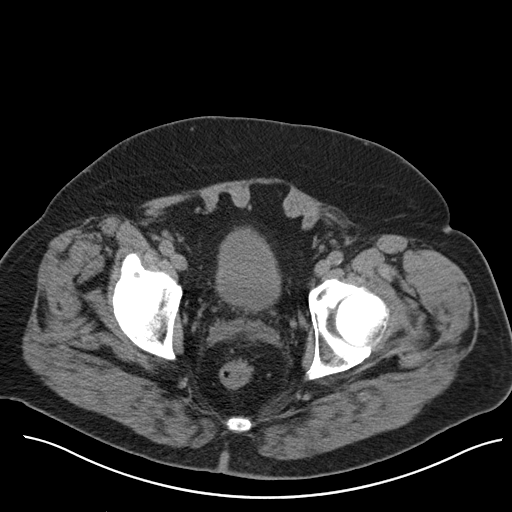
[im 18/90  soft-tissue]
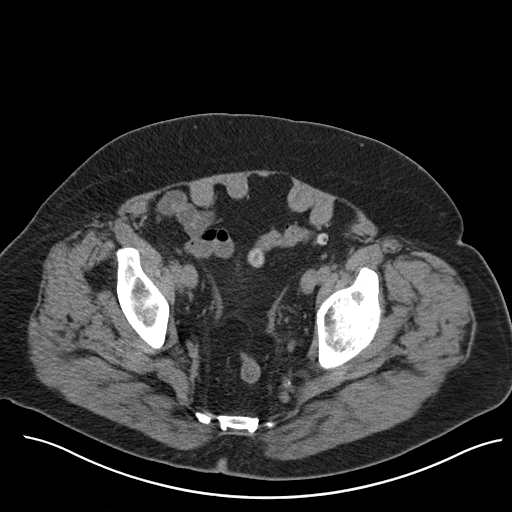
[im 27/90  soft-tissue]
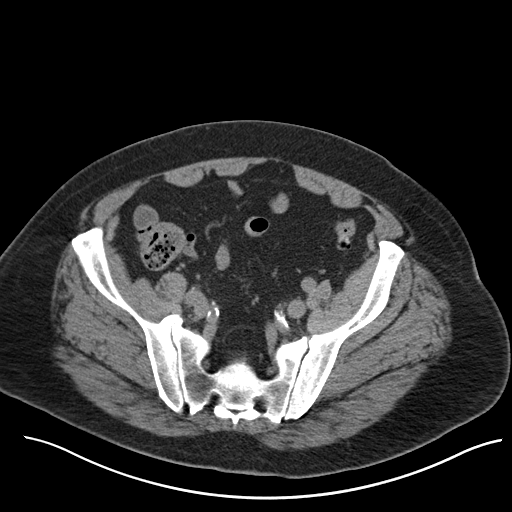
[im 32/90  soft-tissue]
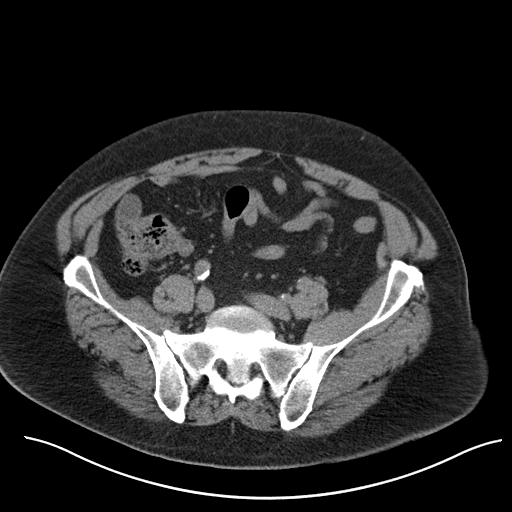
[im 41/90  soft-tissue]
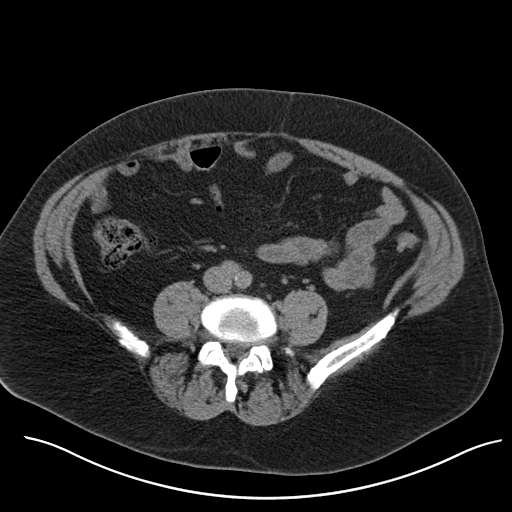
[im 45/90  soft-tissue]
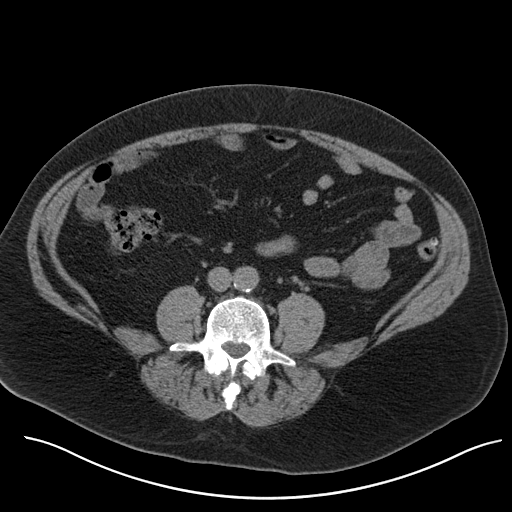
[im 49/90  soft-tissue]
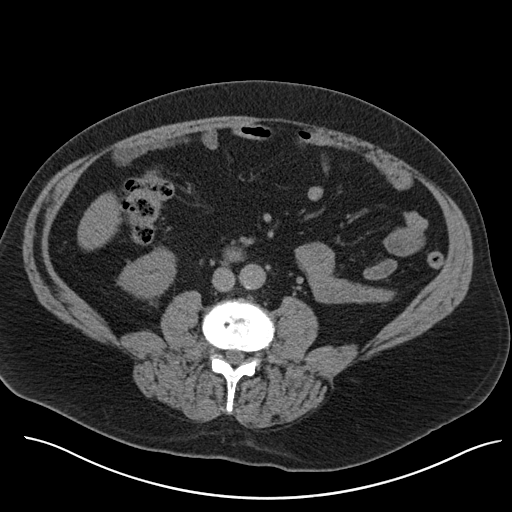
[im 58/90  soft-tissue]
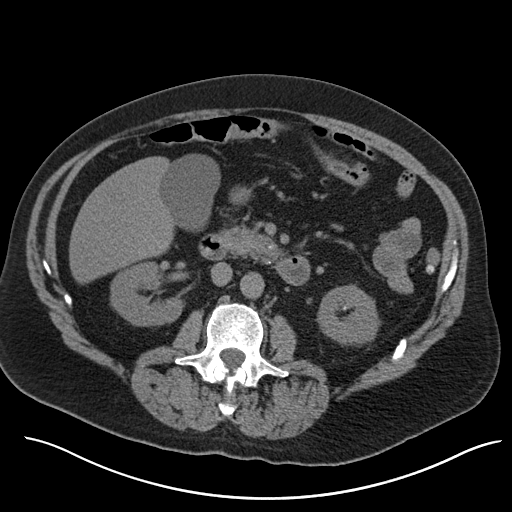
[im 58/90  bone]
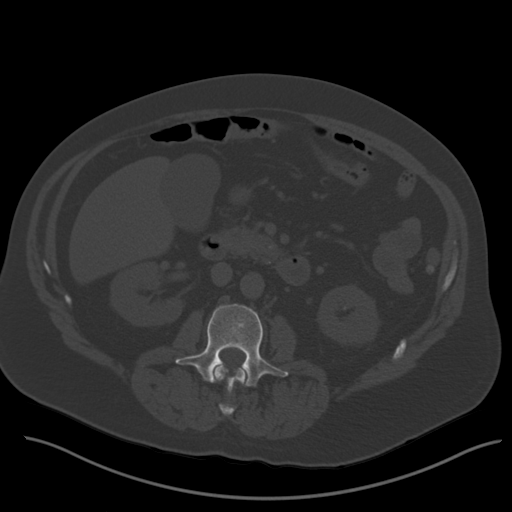
[im 63/90  soft-tissue]
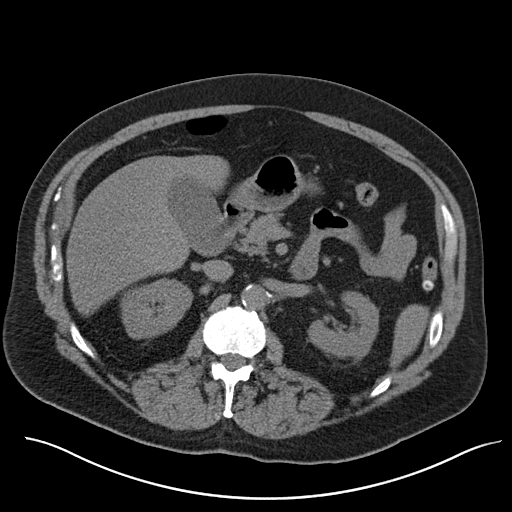
[im 72/90  soft-tissue]
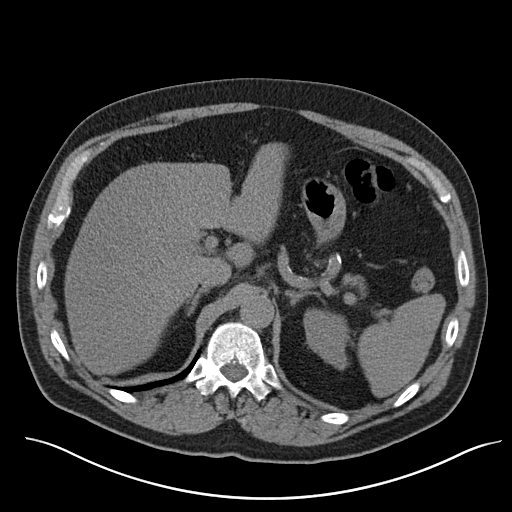
[im 76/90  soft-tissue]
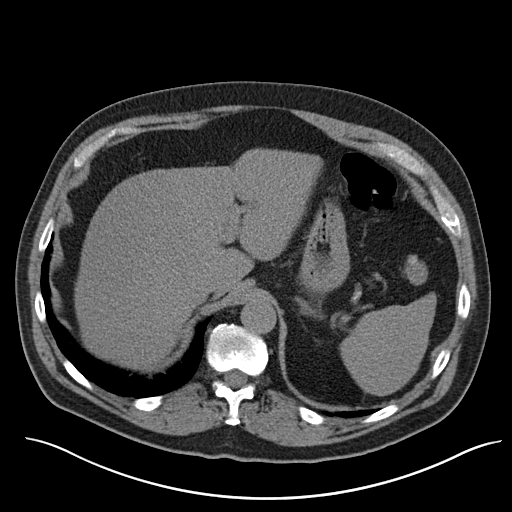
[im 85/90  soft-tissue]
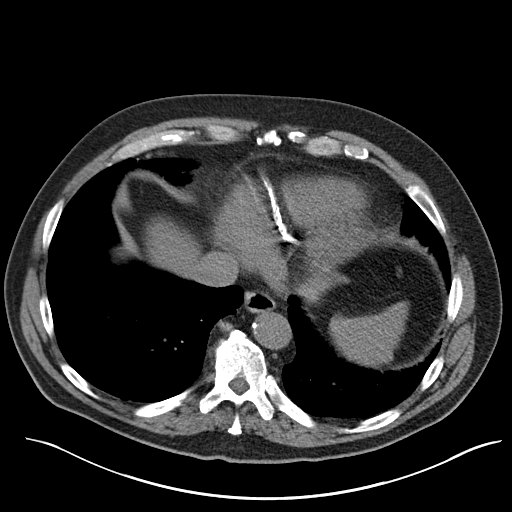

[Series 4: coronal st · coronal · 0.79mm/px · 3 of 157 slices shown]
[im 53/157  soft-tissue]
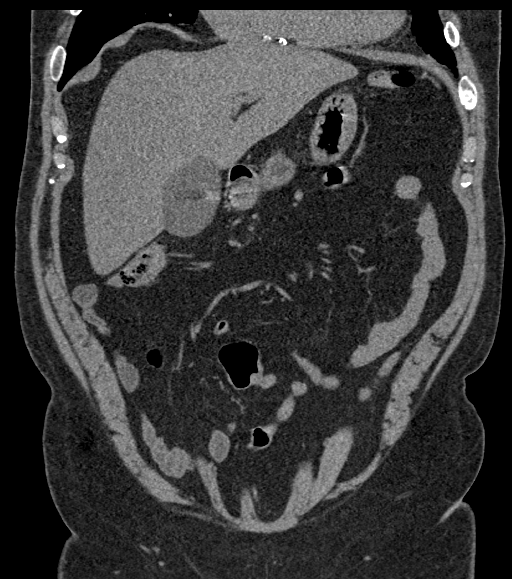
[im 70/157  soft-tissue]
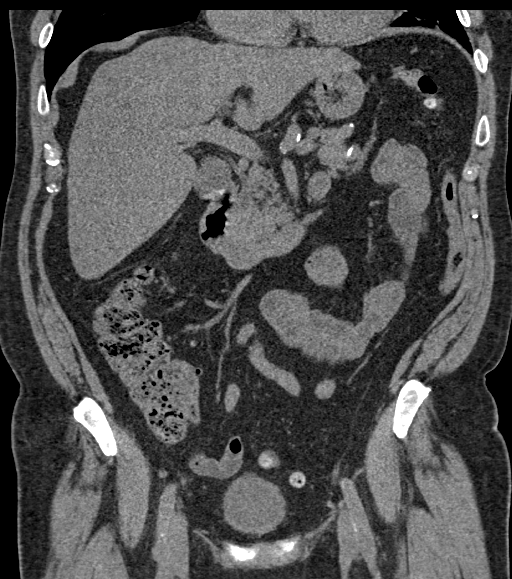
[im 87/157  soft-tissue]
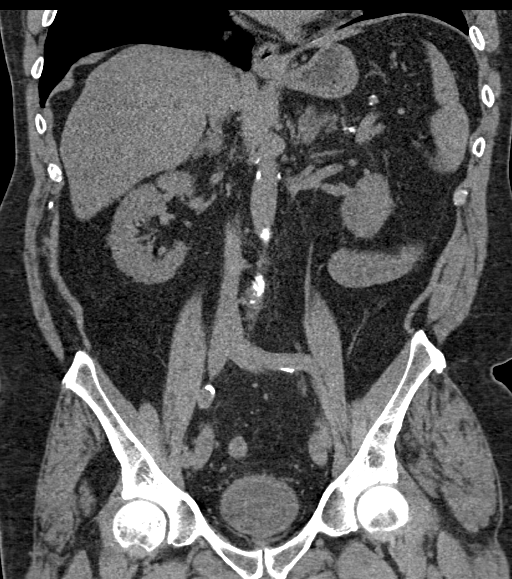

[16 of 46 positions shown; findings below may reference images not displayed]

FINDINGS: Lower chest: The visualized lung bases are clear bilaterally.
Extensive coronary artery calcification. Global cardiac size within
normal limits.

Hepatobiliary: No focal liver abnormality is seen. No gallstones,
gallbladder wall thickening, or biliary dilatation.

Pancreas: Unremarkable

Spleen: Unremarkable

Adrenals/Urinary Tract: The adrenal glands are unremarkable. The
kidneys are normal in size and position. Probable vascular
calcification within the lower pole of the left kidney. The kidneys
are otherwise unremarkable. There is moderate perivesicular
inflammatory stranding present, new since prior examination and
suggestive of an underlying diffuse infectious or inflammatory
cystitis. The bladder is not distended

Stomach/Bowel: Mild scattered distal colonic diverticulosis. The
stomach, small bowel, and large bowel are otherwise unremarkable.
Appendix absent. No free intraperitoneal gas or fluid.

Vascular/Lymphatic: Mild aortoiliac atherosclerotic calcification.
No aortic aneurysm.

Reproductive: Prostate is unremarkable.

Other: Rectum unremarkable.  No abdominal wall hernia identified.

Musculoskeletal: No lytic or blastic bone lesion. No acute bone
abnormality. Advanced degenerative changes are seen at L2-3 with
grade 1 anterolisthesis at this level this is unchanged from prior
examination. Bilateral L2 pars defects are noted.
IMPRESSION: Diffuse perivesicular inflammatory stranding, progressive since
prior examination and suggestive of an underlying diffuse infectious
or inflammatory cystitis. Correlation with urinalysis and urine
culture may be helpful. No upper urinary tract inflammatory change
identified.

Extensive coronary artery calcification.

Stable changes of L2 spondylolytic anterolisthesis with superimposed
advanced degenerative disc disease at this level.

Aortic Atherosclerosis ([AL]-[AL]).

## 2020-11-15 IMAGING — US US ABDOMEN COMPLETE
1 series · 14 of 25 positions shown · non-contrast
Comparison: CT [DATE]

CLINICAL DATA: Acute kidney injury, elevated bilirubin levels

EXAM:
ABDOMEN ULTRASOUND COMPLETE

[Series 1: us abdomen complete · 14 of 113 slices shown]
[im 1/113]
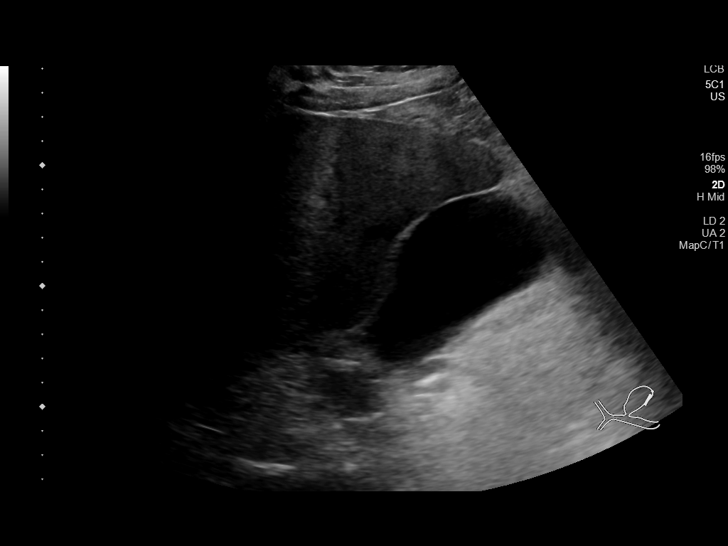
[im 10/113]
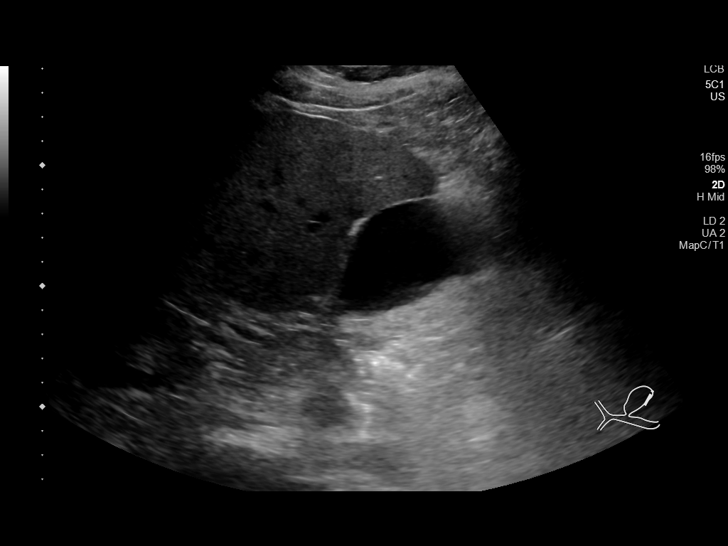
[im 19/113]
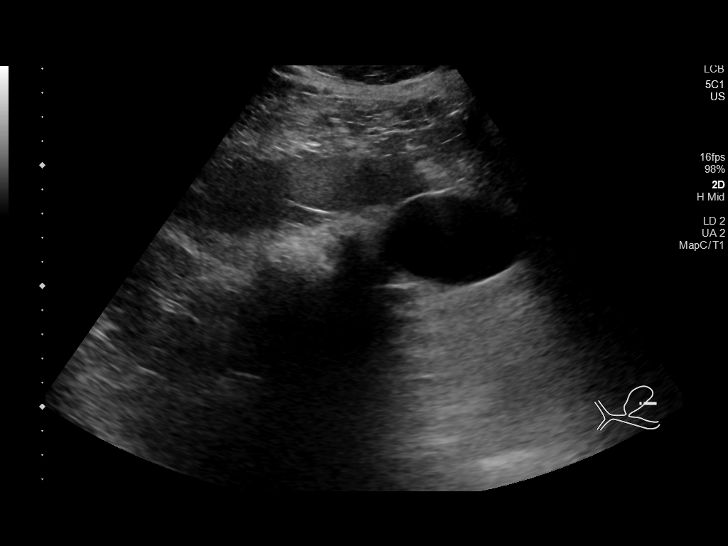
[im 29/113]
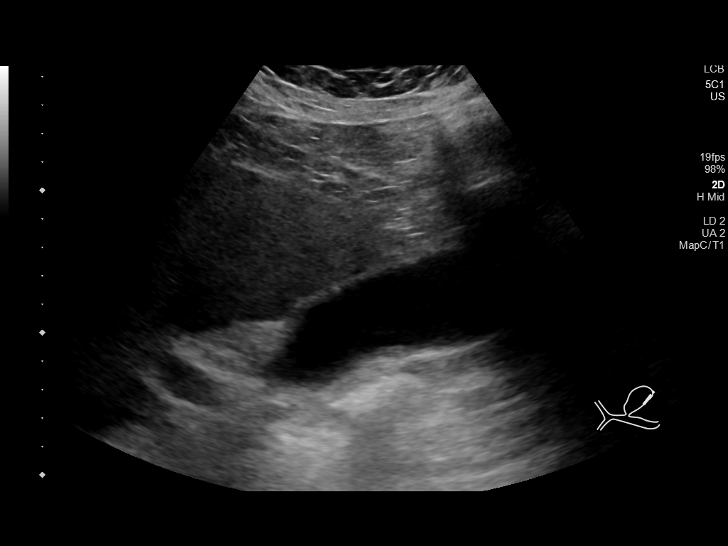
[im 38/113]
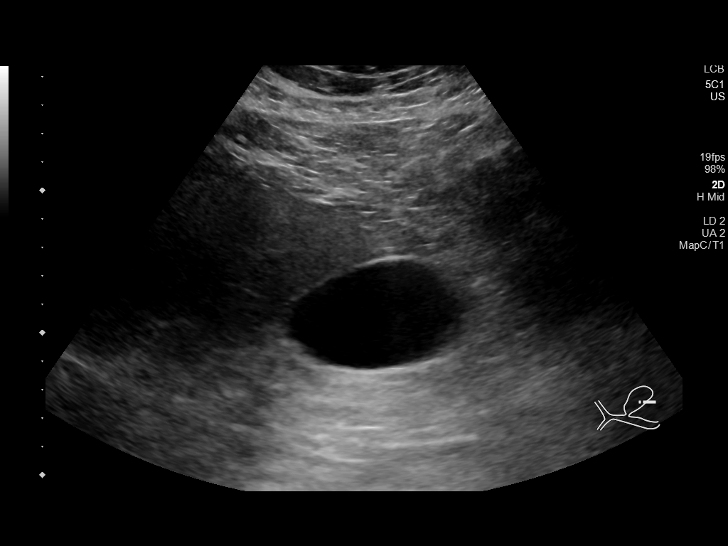
[im 43/113]
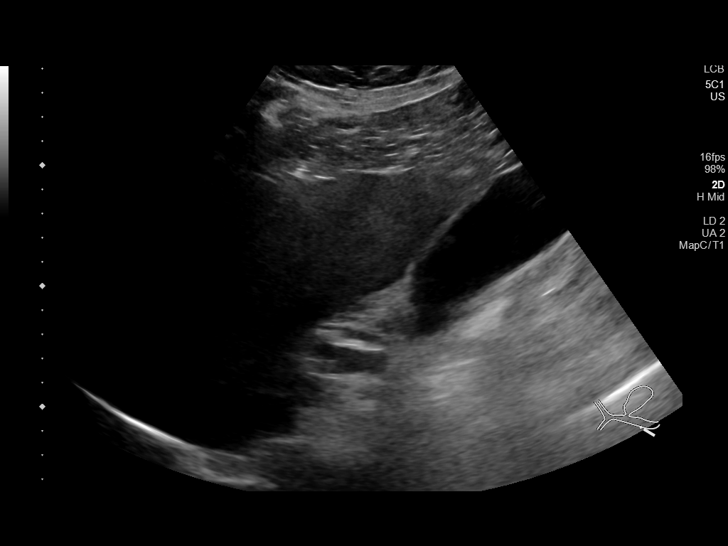
[im 52/113]
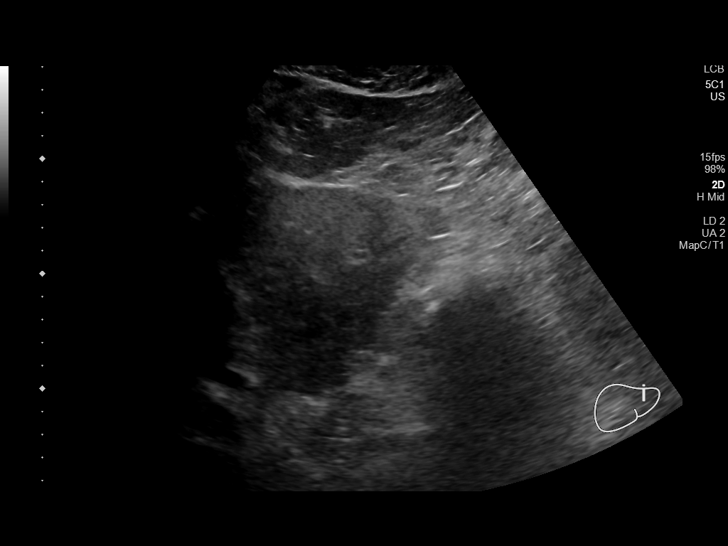
[im 61/113]
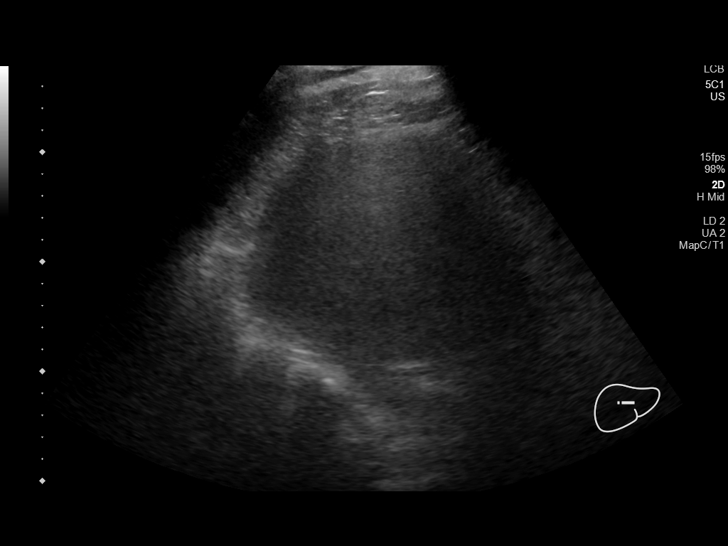
[im 71/113]
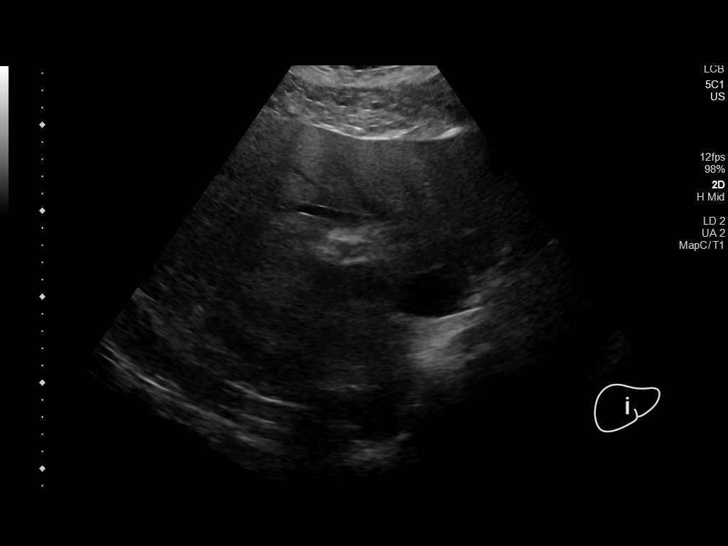
[im 75/113]
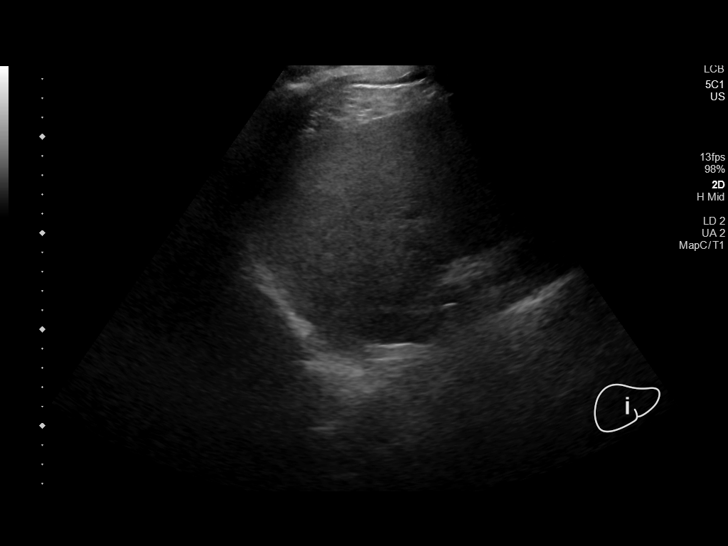
[im 85/113]
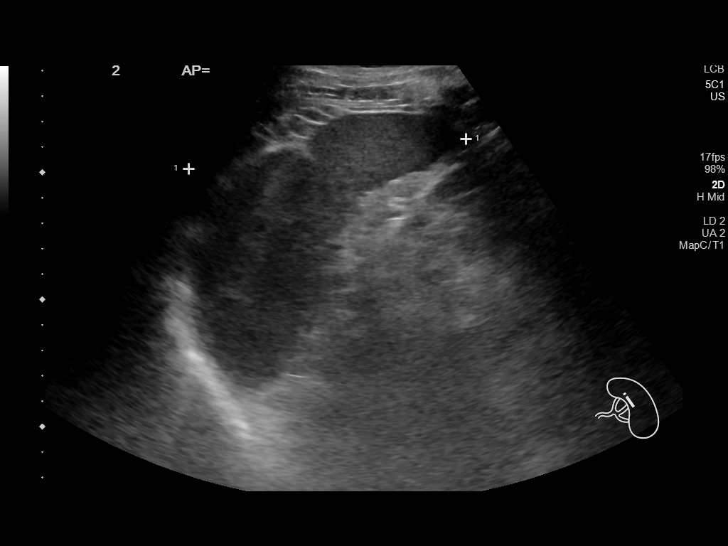
[im 94/113]
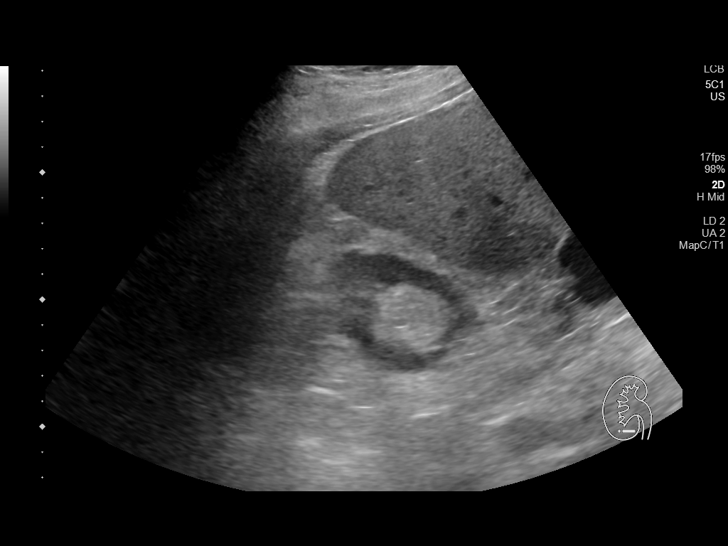
[im 103/113]
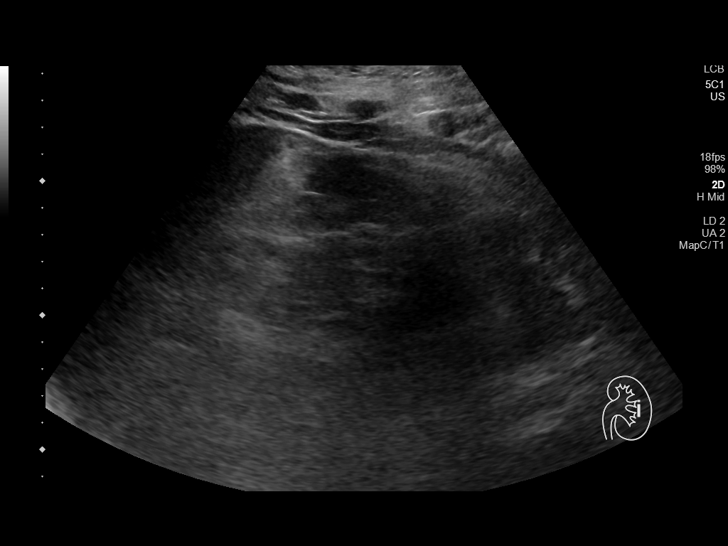
[im 113/113]
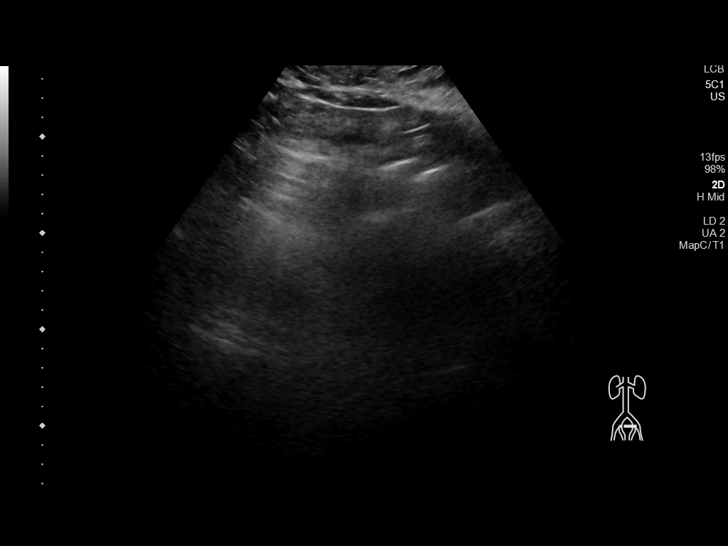

[14 of 25 positions shown; findings below may reference images not displayed]

FINDINGS: Gallbladder: No gallstones or wall thickening visualized. No
sonographic Murphy sign noted by sonographer.

Common bile duct: Diameter: 5 mm.

Liver: No focal lesion identified. Mildly increased hepatic
parenchymal echogenicity. Portal vein is patent on color Doppler
imaging with normal direction of blood flow towards the liver.

IVC: No abnormality visualized.

Pancreas: Visualized portion unremarkable.

Spleen: Size and appearance within normal limits.

Right Kidney: Length: 10.6 cm. Echogenicity within normal limits. No
mass or hydronephrosis visualized.

Left Kidney: Length: 11.7 cm. Echogenicity within normal limits. No
mass or hydronephrosis visualized.

Abdominal aorta: No aneurysm visualized.

Other findings: None.
IMPRESSION: 1. The echogenicity of the liver is mildly increased. This is a
nonspecific finding but is most commonly seen with fatty
infiltration of the liver. There are no obvious focal liver lesions.
2. Remainder of the examination is within normal limits. Negative
for obstructive uropathy.

## 2020-11-15 IMAGING — CR DG CHEST 2V
2 series · 2 of 2 positions shown · non-contrast
Comparison: [DATE]

CLINICAL DATA: Tachycardia, myalgia

EXAM:
CHEST - 2 VIEW

[w chest pa]
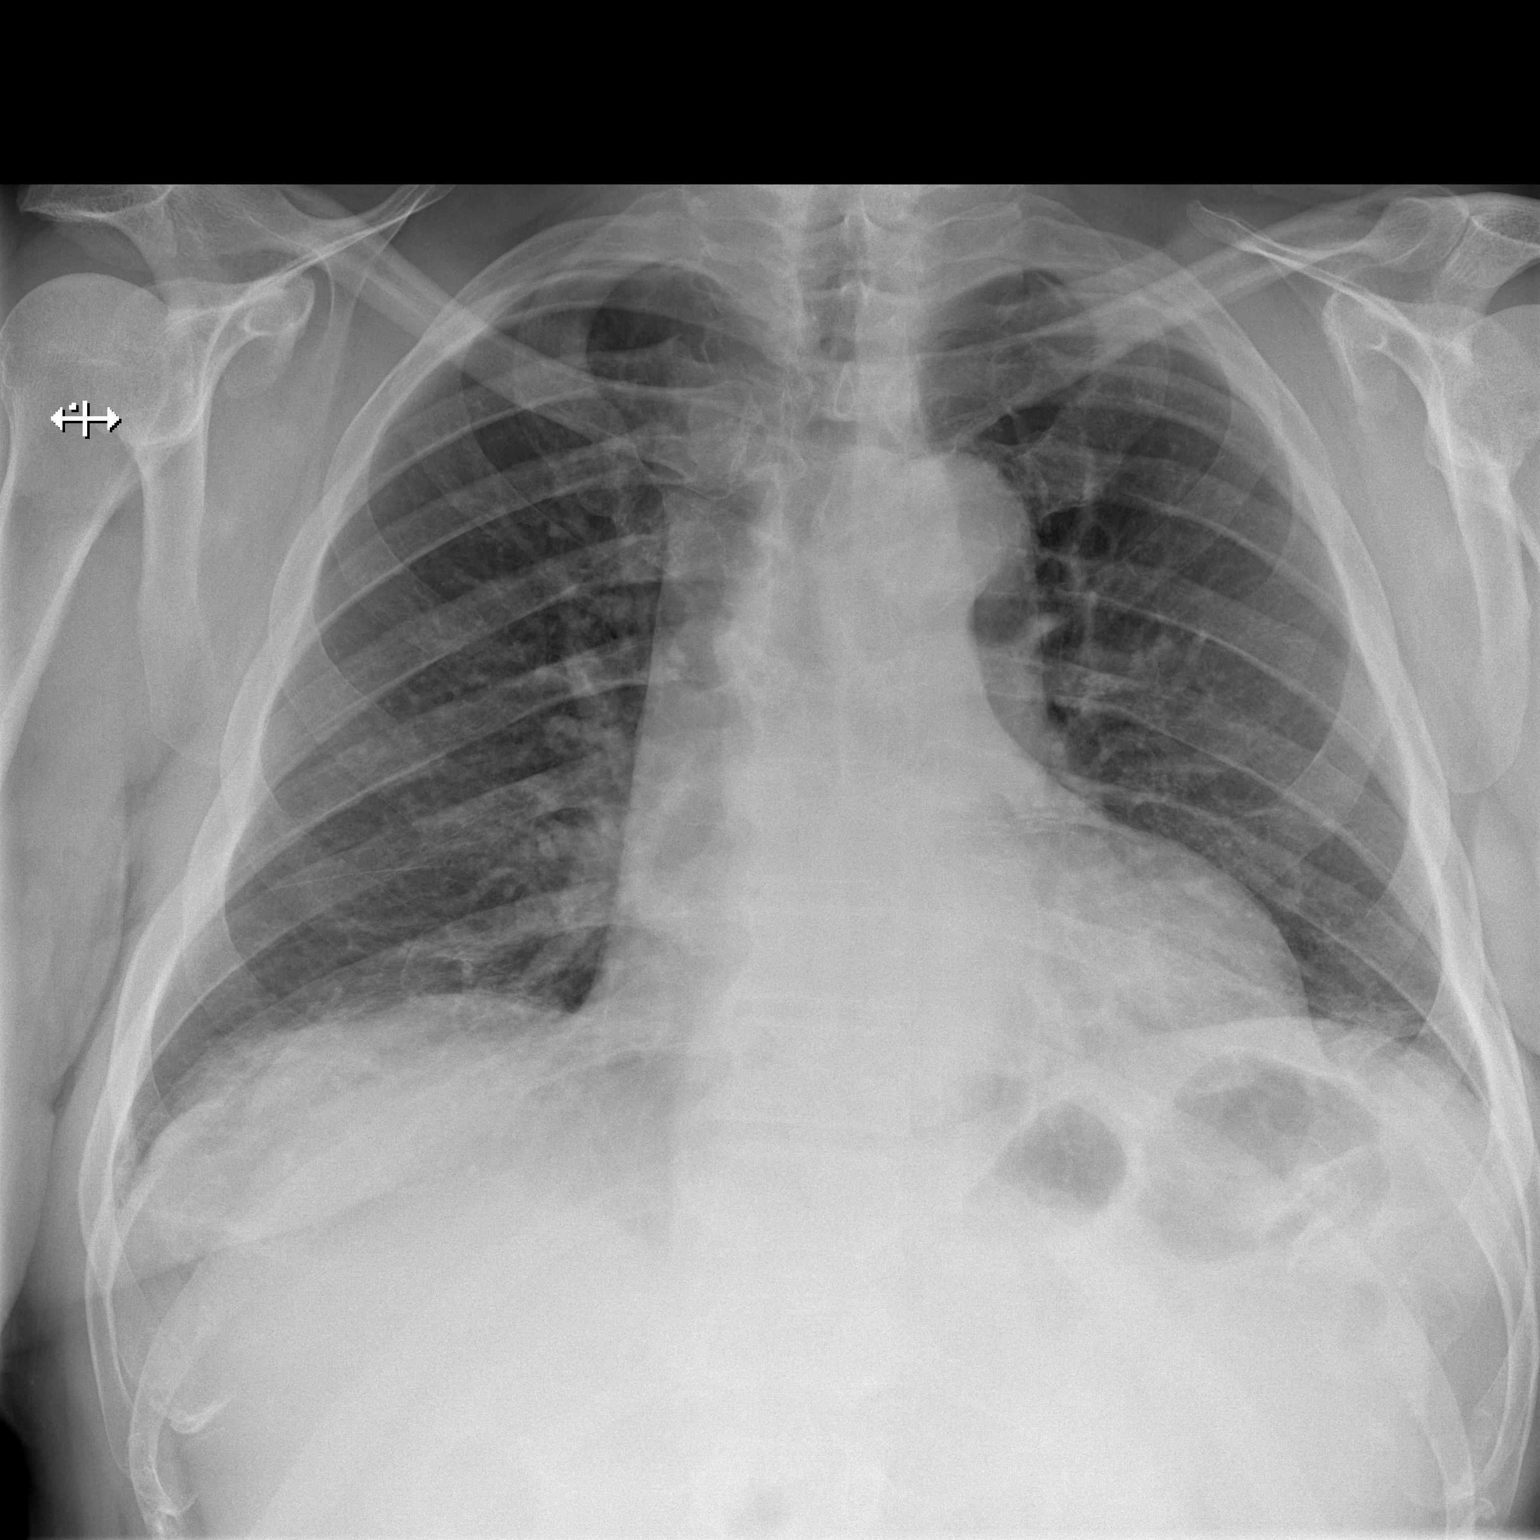

[w chest lat]
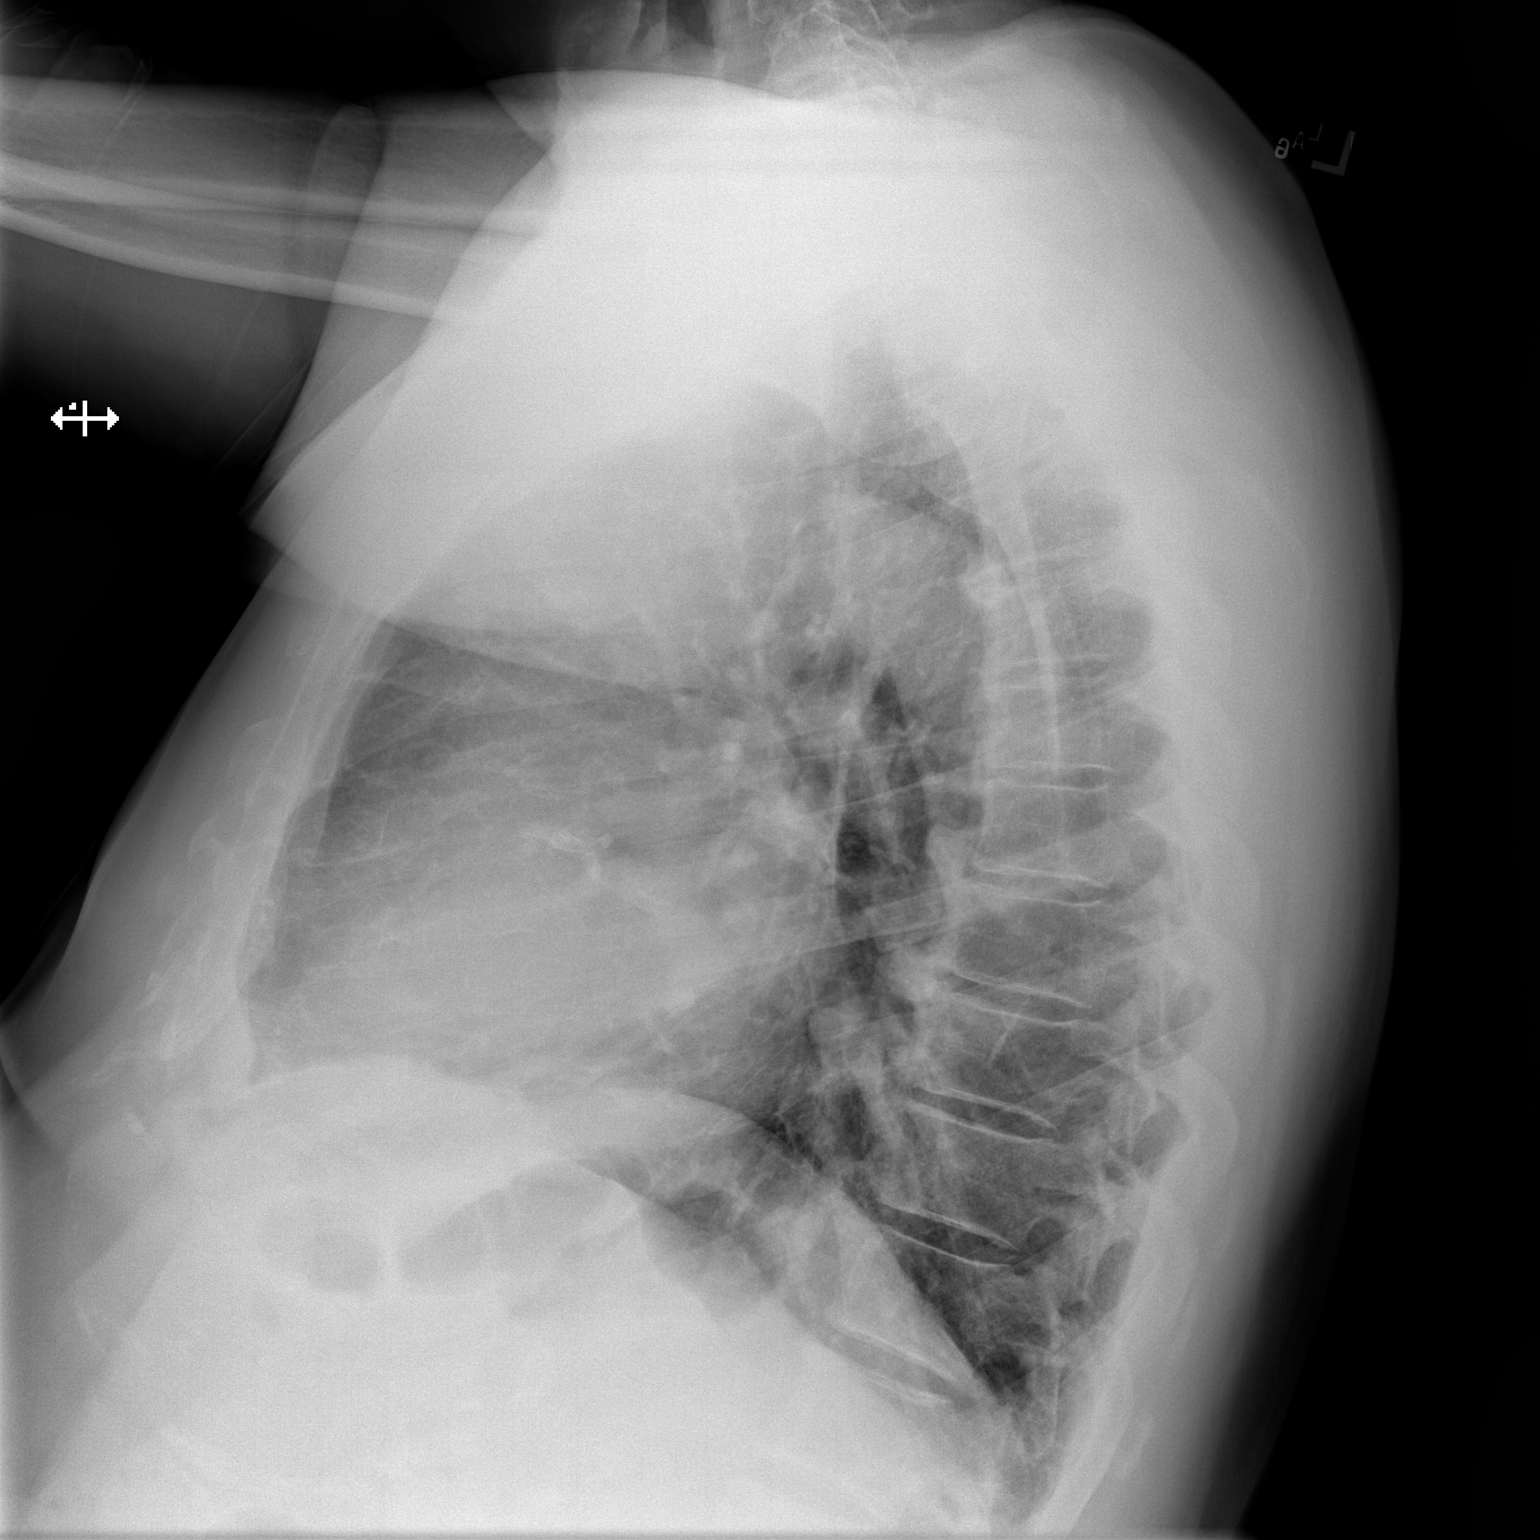

[2 of 2 positions shown; findings below may reference images not displayed]

FINDINGS: Mild bibasilar atelectasis. No pneumothorax or pleural effusion.
Cardiac size within normal limits. Pulmonary vascularity is normal.
Osseous structures are age-appropriate.
IMPRESSION: Bibasilar atelectasis.  In

## 2020-11-15 MED ORDER — METOPROLOL TARTRATE 50 MG PO TABS
50.0000 mg | ORAL_TABLET | Freq: Two times a day (BID) | ORAL | Status: DC
Start: 2020-11-15 — End: 2020-11-18
  Administered 2020-11-15 – 2020-11-17 (×4): 50 mg via ORAL
  Filled 2020-11-15: qty 2
  Filled 2020-11-15 (×3): qty 1

## 2020-11-15 MED ORDER — SODIUM CHLORIDE 0.9 % IV BOLUS (SEPSIS)
1000.0000 mL | Freq: Once | INTRAVENOUS | Status: AC
  Administered 2020-11-15: 1000 mL via INTRAVENOUS

## 2020-11-15 MED ORDER — KETOROLAC TROMETHAMINE 15 MG/ML IJ SOLN
15.0000 mg | Freq: Once | INTRAMUSCULAR | Status: AC
  Administered 2020-11-15: 15 mg via INTRAVENOUS
  Filled 2020-11-15: qty 1

## 2020-11-15 MED ORDER — ONDANSETRON HCL 4 MG/2ML IJ SOLN
4.0000 mg | Freq: Four times a day (QID) | INTRAMUSCULAR | Status: DC | PRN
  Administered 2020-11-15: 4 mg via INTRAVENOUS
  Filled 2020-11-15: qty 2

## 2020-11-15 MED ORDER — SODIUM CHLORIDE 0.9 % IV SOLN
1.0000 g | Freq: Once | INTRAVENOUS | Status: AC
  Administered 2020-11-15: 1 g via INTRAVENOUS
  Filled 2020-11-15: qty 10

## 2020-11-15 MED ORDER — ALPRAZOLAM 1 MG PO TABS
1.0000 mg | ORAL_TABLET | Freq: Every day | ORAL | Status: DC | PRN
  Administered 2020-11-15 – 2020-11-17 (×2): 1 mg via ORAL
  Filled 2020-11-15: qty 1
  Filled 2020-11-15: qty 2

## 2020-11-15 MED ORDER — ACETAMINOPHEN 650 MG RE SUPP: 650.0000 mg | Freq: Four times a day (QID) | RECTAL | Status: DC | PRN

## 2020-11-15 MED ORDER — SODIUM CHLORIDE 0.9 % IV SOLN
1000.0000 mL | INTRAVENOUS | Status: DC
  Administered 2020-11-15 (×2): 1000 mL via INTRAVENOUS

## 2020-11-15 MED ORDER — HYDROMORPHONE HCL 1 MG/ML IJ SOLN
0.2500 mg | Freq: Once | INTRAMUSCULAR | Status: AC
  Administered 2020-11-15: 0.25 mg via INTRAVENOUS
  Filled 2020-11-15: qty 1

## 2020-11-15 MED ORDER — ACETAMINOPHEN 325 MG PO TABS
650.0000 mg | ORAL_TABLET | Freq: Four times a day (QID) | ORAL | Status: DC | PRN
  Administered 2020-11-15 – 2020-11-16 (×2): 650 mg via ORAL
  Filled 2020-11-15 (×2): qty 2

## 2020-11-15 MED ORDER — HYDROMORPHONE HCL 2 MG PO TABS
1.0000 mg | ORAL_TABLET | Freq: Once | ORAL | Status: AC
  Administered 2020-11-15: 1 mg via ORAL
  Filled 2020-11-15: qty 1

## 2020-11-15 MED ORDER — ONDANSETRON HCL 4 MG PO TABS: 4.0000 mg | ORAL_TABLET | Freq: Four times a day (QID) | ORAL | Status: DC | PRN

## 2020-11-15 MED ORDER — SODIUM CHLORIDE 0.9 % IV SOLN
1.0000 g | INTRAVENOUS | Status: DC
  Administered 2020-11-16 – 2020-11-17 (×2): 1 g via INTRAVENOUS
  Filled 2020-11-15 (×2): qty 1
  Filled 2020-11-15: qty 10

## 2020-11-15 MED ORDER — METOPROLOL TARTRATE 25 MG PO TABS: 25.0000 mg | ORAL_TABLET | Freq: Once | ORAL | Status: DC

## 2020-11-15 MED ORDER — WARFARIN SODIUM 7.5 MG PO TABS
7.5000 mg | ORAL_TABLET | Freq: Once | ORAL | Status: AC
  Administered 2020-11-15: 7.5 mg via ORAL
  Filled 2020-11-15: qty 1

## 2020-11-15 MED ORDER — WARFARIN - PHARMACIST DOSING INPATIENT: Freq: Every day | Status: DC

## 2020-11-15 MED ORDER — OXYMETAZOLINE HCL 0.05 % NA SOLN
1.0000 | Freq: Two times a day (BID) | NASAL | Status: DC | PRN
  Filled 2020-11-15: qty 15

## 2020-11-15 MED ORDER — METOPROLOL TARTRATE 25 MG PO TABS
25.0000 mg | ORAL_TABLET | Freq: Two times a day (BID) | ORAL | Status: DC
  Administered 2020-11-15: 25 mg via ORAL
  Filled 2020-11-15: qty 1

## 2020-11-15 NOTE — ED Notes (Signed)
Patient ambulated to the bathroom/ Steady gate Patient had a bowel movement

## 2020-11-15 NOTE — ED Notes (Signed)
Pt ambulated to bathroom 

## 2020-11-15 NOTE — ED Notes (Signed)
Date and time results received: 11/15/20 4:10 AM (use smartphrase ".now" to insert current time)  Test: Lactic acid Critical Value: 2.6  Name of Provider Notified: Cardama  Orders Received? Or Actions Taken?: Orders Received - See Orders for details

## 2020-11-15 NOTE — ED Notes (Signed)
Date and time results received: 11/15/20 0058 (use smartphrase ".now" to insert current time)  Test: Lactic acid Critical Value: 2.4  Name of Provider Notified: Dr. Burley Saver   Orders Received? Or Actions Taken?: Actions Taken: Provider notified.

## 2020-11-15 NOTE — ED Notes (Signed)
Report given to Clay, Charity fundraiser.  Pt SBAR information covered at this time.  Receiving nurse has no additional questions.  Will continue to monitor.

## 2020-11-15 NOTE — ED Notes (Signed)
Patient ambulated to bathroom steady gate Patient had a bowel movement

## 2020-11-15 NOTE — ED Notes (Signed)
Patient received breakfast tray 

## 2020-11-15 NOTE — Progress Notes (Signed)
ANTICOAGULATION CONSULT NOTE - Initial Consult  Pharmacy Consult for warfarin Indication: atrial fibrillation  Allergies  Allergen Reactions  . Gluten Meal Other (See Comments)    REACTION: Celiac disease (severe stomach pain) REACTION: Celiac disease (severe stomach pain)   . Iodinated Diagnostic Agents Other (See Comments) and Anaphylaxis    V-Fib arrest during cardiac cath suspected d/t contrast dye in 2016 Cardiac arrest Cardiac arrest V-Fib arrest during cardiac cath suspected d/t contrast dye in 2016 Cardiac arrest  . Metrizamide Other (See Comments)    V-Fib arrest during cardiac cath suspected d/t contrast dye in 2016  . Tizanidine Anxiety    Depression  . Whey Other (See Comments)    REACTION: Celiac disease (severe stomach pain)  . Morphine And Related Other (See Comments)    REACTION: "REALLY BAD STOMACH PAIN"  . Apple Diarrhea  . Tizanidine Hcl Anxiety    Depression  . Wheat Bran Other (See Comments)    Vital Signs: BP: 145/100 (01/06 1050) Pulse Rate: 139 (01/06 1050)  Labs: Recent Labs    11/14/20 2244 11/15/20 1049  HGB 16.4  --   HCT 49.2  --   PLT 141*  --   LABPROT  --  19.3*  INR  --  1.7*  CREATININE 1.30*  --     CrCl cannot be calculated (Unknown ideal weight.).   Medical History: Past Medical History:  Diagnosis Date  . Arthritis    "knees" (01/02/2017)  . Ascending aorta dilatation (HCC)     59mm by CT 2017 and 60mm by CT 2019, 72mm by echo 07/2020  . CAD (coronary artery disease)    a. s/p PCI in 2010 in [place]. b. Unstable angina/LHC 06/18/2015 complicated by V fib arrest after contrast injection. Cath 06/19/2015 s/p DES to mid LAD and RPDA. c. 11/2016: chest pain/ abnormal nuclear stress test prompting cath 11/13/16 showing 80% ostial diag, 60% mid RCA, no acute lesions, medical therapy recommended.  . Dyslipidemia   . Essential hypertension   . Lung nodule < 6cm on CT 06/16/15   Right lung  . Mitral regurgitation    mild to  moderate by echo 07/2020  . OSA on CPAP   . Persistent atrial fibrillation (HCC)    a. 06/2015 noted to be in new a-fib when arrived with unstable angina. Converted to NSR after being shocked in the cath lab for vfib;  b. 06/2015 Eliquis initiated as PAF noted on event monitor. c. Recurrent atrial fib?flutter 10/2016, issues with recurrent AF requiring multiple DCCVs in 02/2017. Failed sotalol, not candidate for Tikosyn due to cost/QTC, Multaq not felt likely strong enough.  . Pulmonary nodule 07/2015   a. 1.5 x 1.2 cm smoothly marginated nodule in the central aspect of the right lower lobe with recommendation correlation with nonemergent PET-CT to exclude a neoplasm , stable by CT 07/2016  . Severe left ventricular hypertrophy   . Ventricular fibrillation (HCC)    a. occured during cath 06/18/2015.    Medications:  Warfarin 7.5 mg daily except 10 mg on Saturday  Assessment: 70 y/o M with a h/o atrial fibrillation on warfarin PTA admitted with stomach pain. Patient is being treated for sepsis secondary to UTI and hyperbilirubinemia. Pharmacy consulted to continue warfarin dosing. Last dose of warfarin 1/4 per med history.   11/15/20 12:56 PM   INR sub-therapeutic at 1.7, did miss dose 1/5 apparently  Patient is on ceftriaxone which may potentiate warfarin  Goal of Therapy:  INR 2-3  Plan:  Warfarin 7.5 mg once today F/U INR  Valentina Gu 11/15/2020,12:46 PM

## 2020-11-15 NOTE — ED Provider Notes (Signed)
Troy [name]  CSN: [id number] Arrival date & time: 11/14/20 1947  Chief Complaint(s) Fever  HPI Troy [name] is a 70 y.o. male with a past medical history listed below who presents to the emergency department with 2 days of fever, chills and rigor.  Patient also having abdominal discomfort.  Denies any nausea vomiting.  No diarrhea.  Reports no bowel movements in 2 days.  He is endorsing change in urine color and dysuria.  No coughing or congestion.  No other physical complaints  HPI  Past Medical History Past Medical History:  Diagnosis Date  . Arthritis    "knees" (01/02/2017)  . Ascending aorta dilatation (HCC)     57mm by CT 2017 and 26mm by CT 2019, 48mm by echo 07/2020  . CAD (coronary artery disease)    a. s/p PCI in 2010 in [place]. b. Unstable angina/LHC [id number] complicated by V fib arrest after contrast injection. Cath 06/19/2015 s/p DES to mid LAD and RPDA. c. 11/2016: chest pain/ abnormal nuclear stress test prompting cath 11/13/16 showing 80% ostial diag, 60% mid RCA, no acute lesions, medical therapy recommended.  . Dyslipidemia   . Essential hypertension   . Lung nodule < 6cm on CT 06/16/15   Right lung  . Mitral regurgitation    mild to moderate by echo 07/2020  . OSA on CPAP   . Persistent atrial fibrillation ([place])    a. 06/2015 noted to be in new a-fib when arrived with unstable angina. Converted to NSR after being shocked in the cath lab for vfib;  b. 06/2015 Eliquis initiated as PAF noted on event monitor. c. Recurrent atrial fib?flutter 10/2016, issues with recurrent AF requiring multiple DCCVs in 02/2017. Failed sotalol, not candidate for Tikosyn due to cost/QTC, Multaq not felt likely strong enough.  . Pulmonary nodule 07/2015   a. 1.5 x 1.2 cm smoothly marginated nodule in the central aspect of the right lower lobe with recommendation correlation with nonemergent PET-CT to exclude a neoplasm , stable by CT 07/2016  .  Severe left ventricular hypertrophy   . Ventricular fibrillation ([place])    a. occured during cath 06/18/2015.   Patient Active Problem List   Diagnosis Date Noted  . Encounter for therapeutic drug monitoring 01/31/2020  . Ascending aorta dilatation (HCC)   . OA (osteoarthritis) of knee 02/08/2018  . Environmental and seasonal allergies 07/06/2015  . Pulmonary nodule 06/27/2015  . Mitral regurgitation   . Ventricular fibrillation ([place])   . Dyslipidemia 06/18/2015  . CAD (coronary artery disease), native coronary artery 06/17/2015  . Persistent atrial fibrillation with RVR ([place]), successful TEE DCCV 11/17/16 06/16/2015  . Essential (primary) hypertension 04/11/2015  . Abnormal presence of protein in urine 04/11/2015   Home Medication(s) Prior to Admission medications   Medication Sig Start Date End Date Taking? Authorizing Provider  atorvastatin (LIPITOR) 80 MG tablet Take 1 tablet (80 mg total) by mouth daily. 11/18/17   Dunn, Nedra Hai, PA-C  ezetimibe (ZETIA) 10 MG tablet TAKE 1 TABLET(10 MG) BY MOUTH DAILY 01/30/20   Sueanne Margarita, MD  Fluorouracil (TOLAK) 4 % CREA Apply 1 application topically at bedtime. 10/10/20   Lavonna Monarch, MD  gabapentin (NEURONTIN) 300 MG capsule Take 1 capsule (300 mg total) by mouth 3 (three) times daily. Gabapentin 300 mg Protocol Take a 300 mg capsule three times a day for two weeks, Then a 300 mg capsule twice a day for two weeks, Then a 300 mg capsule once a  day for two weeks, then discontinue the Gabapentin. 02/11/18   Perkins, Alexzandrew L, PA-C  LORazepam (ATIVAN) 0.5 MG tablet Take 0.5 mg by mouth daily as needed for anxiety.    [provider]  losartan (COZAAR) 100 MG tablet TAKE 1 TABLET(100 MG) BY MOUTH DAILY 06/27/20   Camnitz, [name] Doyne, MD  methocarbamol (ROBAXIN) 500 MG tablet Take 1 tablet (500 mg total) by mouth every 6 (six) hours as needed for muscle spasms. 02/11/18   Perkins, Alexzandrew L, PA-C  metoprolol tartrate (LOPRESSOR) 100  MG tablet TAKE 1 TABLET(100 MG) BY MOUTH TWICE DAILY 01/30/20   Camnitz, [name] Doyne, MD  nitroGLYCERIN (NITROSTAT) 0.3 MG SL tablet Place 1 tablet (0.3 mg total) under the tongue every 5 (five) minutes as needed for chest pain. 01/12/17   Sherran Needs, NP  oxymetazoline (AFRIN) 0.05 % nasal spray Place 1 spray into both nostrils 2 (two) times daily as needed (FOR SINUS CONGESTION.). 02/09/18   Perkins, Alexzandrew L, PA-C  spironolactone (ALDACTONE) 25 MG tablet TAKE 1 TABLET(25 MG) BY MOUTH DAILY 04/27/20   Belva Crome, MD  warfarin (COUMADIN) 5 MG tablet TAKE AS DIRECTED BY ANTICOAGULATION CLINIC 10/10/20   Constance Haw, MD                                                                                                                                    Past Surgical History Past Surgical History:  Procedure Laterality Date  . CARDIAC CATHETERIZATION N/A 06/18/2015   Procedure: Left Heart Cath and Coronary Angiography;  Surgeon: Burnell Blanks, MD;  Location: [facility];  Service: Cardiovascular;  Laterality: N/A;  . CARDIAC CATHETERIZATION N/A 06/19/2015   Procedure: Coronary Stent Intervention;  Surgeon: Peter M Martinique, MD;  Location: [facility];  Service: Cardiovascular;  Laterality: N/A;  . CARDIAC CATHETERIZATION N/A 11/13/2016   Procedure: Left Heart Cath and Coronary Angiography;  Surgeon: Lorretta Harp, MD;  Location: [facility];  Service: Cardiovascular;  Laterality: N/A;  . CARDIOVERSION N/A 11/17/2016   Procedure: CARDIOVERSION;  Surgeon: Larey Dresser, MD;  Location[facility];  Service: Cardiovascular;  Laterality: N/A;  . CARDIOVERSION N/A 01/03/2017   Procedure: CARDIOVERSION;  Surgeon: Lelon Perla, MD;  Location: [facility];  Service: Cardiovascular;  Laterality: N/A;  . CARDIOVERSION N/A 02/05/2017   Procedure: Cardioversion;  Surgeon: Evans Lance, MD;  Location: [facility];  Service: Cardiovascular;  Laterality: N/A;  .  CARDIOVERSION N/A 02/20/2017   Procedure: Cardioversion;  Surgeon: Evans Lance, MD;  Location: [facility];  Service: Cardiovascular;  Laterality: N/A;  . CARTILAGE SURGERY Bilateral    "thumbs"  . CATARACT EXTRACTION W/ INTRAOCULAR LENS  IMPLANT, BILATERAL Bilateral   . CORONARY ANGIOPLASTY WITH STENT PLACEMENT  ~ 2007  . JOINT REPLACEMENT    . KNEE ARTHROSCOPY Right   . RETINAL DETACHMENT SURGERY  Bilateral    "laser thing"  . TEE WITHOUT CARDIOVERSION N/A 11/17/2016   Procedure: TRANSESOPHAGEAL ECHOCARDIOGRAM (TEE);  Surgeon: Laurey Morale, MD;  Location: [facility]COPY;  Service: Cardiovascular;  Laterality: N/A;  . TOTAL KNEE ARTHROPLASTY Left 2000s  . TOTAL KNEE ARTHROPLASTY Right 02/08/2018   Procedure: RIGHT TOTAL KNEE ARTHROPLASTY;  Surgeon: Ollen Gross, MD;  Location: [facility];  Service: Orthopedics;  Laterality: Right;  with block   Family History Family History  Problem Relation Age of Onset  . Stroke Mother   . Emphysema Mother   . Lung cancer Mother   . Heart attack Father   . Drug abuse Father   . Heart attack Maternal Grandfather   . Heart attack Paternal Grandfather   . Diabetes Sister     Social History Social History   Tobacco Use  . Smoking status: Never Smoker  . Smokeless tobacco: Never Used  . Tobacco comment: Second-hand exposure through parents  Vaping Use  . Vaping Use: Never used  Substance Use Topics  . Alcohol use: Yes    Alcohol/week: 2.0 standard drinks    Types: 2 Glasses of wine per week  . Drug use: No   Allergies Gluten meal, Iodinated diagnostic agents, Metrizamide, Tizanidine, Whey, Morphine and related, Apple, Tizanidine hcl, and Wheat bran  Review of Systems Review of Systems All other systems are reviewed and are negative for acute change except as noted in the HPI  Physical Exam Vital Signs  I have reviewed the triage vital signs BP (!) 127/95 (BP Location: Left Arm)   Pulse (!) 119   Temp 100.1 F (37.8 C) (Oral)    Resp 16   SpO2 96%   Physical Exam Vitals reviewed.  Constitutional:      General: He is not in acute distress.    Appearance: He is well-developed and well-nourished. He is not diaphoretic.  HENT:     Head: Normocephalic and atraumatic.     Jaw: No trismus.     Right Ear: External ear normal.     Left Ear: External ear normal.     Nose: Nose normal.     Mouth/Throat:     Mouth: Mucous membranes are normal.  Eyes:     General: No scleral icterus.    Extraocular Movements: EOM normal.     Conjunctiva/sclera: Conjunctivae normal.  Neck:     Trachea: Phonation normal.  Cardiovascular:     Rate and Rhythm: Normal rate and regular rhythm.  Pulmonary:     Effort: Pulmonary effort is normal. No respiratory distress.     Breath sounds: No stridor.  Abdominal:     General: There is no distension.     Tenderness: There is abdominal tenderness in the suprapubic area, left upper quadrant and left lower quadrant. There is no guarding or rebound.  Musculoskeletal:        General: No edema. Normal range of motion.     Cervical back: Normal range of motion.  Neurological:     Mental Status: He is alert and oriented to person, place, and time.  Psychiatric:        Mood and Affect: Mood and affect normal.        Behavior: Behavior normal.     ED Results and Treatments Labs (all labs ordered are listed, but only abnormal results are displayed) Labs Reviewed  CBC WITH DIFFERENTIAL/PLATELET - Abnormal; Notable for the following components:      Result Value   WBC 18.1 (*)  Platelets 141 (*)    Neutro Abs 14.6 (*)    Monocytes Absolute 2.0 (*)    Abs Immature Granulocytes 0.12 (*)    All other components within normal limits  LACTIC ACID, PLASMA - Abnormal; Notable for the following components:   Lactic Acid, Venous 2.4 (*)    All other components within normal limits  LACTIC ACID, PLASMA - Abnormal; Notable for the following components:   Lactic Acid, Venous 2.6 (*)    All  other components within normal limits  COMPREHENSIVE METABOLIC PANEL - Abnormal; Notable for the following components:   Glucose, Bld 141 (*)    Creatinine, Ser 1.30 (*)    Total Bilirubin 2.6 (*)    GFR, Estimated 59 (*)    All other components within normal limits  URINALYSIS, ROUTINE W REFLEX MICROSCOPIC - Abnormal; Notable for the following components:   Color, Urine AMBER (*)    APPearance CLOUDY (*)    Specific Gravity, Urine 1.033 (*)    Hgb urine dipstick SMALL (*)    Ketones, ur 5 (*)    Protein, ur 100 (*)    Leukocytes,Ua LARGE (*)    WBC, UA >50 (*)    Bacteria, UA FEW (*)    All other components within normal limits  RESP PANEL BY RT-PCR (FLU A&B, COVID) [id number]  URINE CULTURE  CULTURE, BLOOD (ROUTINE X 2)  CULTURE, BLOOD (ROUTINE X 2)  LIPASE, BLOOD                                                                                                                         EKG  EKG Interpretation  Date/Time:    Ventricular Rate:    PR Interval:    QRS Duration:   QT Interval:    QTC Calculation:   R Axis:     Text Interpretation:        Radiology CT ABDOMEN PELVIS WO CONTRAST  Result Date: 11/15/2020 CLINICAL DATA:  Abdominal pain, fever, leukocytosis, lactic acidosis EXAM: CT ABDOMEN AND PELVIS WITHOUT CONTRAST TECHNIQUE: Multidetector CT imaging of the abdomen and pelvis was performed following the standard protocol without IV contrast. COMPARISON:  07/29/2019 FINDINGS: Lower chest: The visualized lung bases are clear bilaterally. Extensive coronary artery calcification. Global cardiac size within normal limits. Hepatobiliary: No focal liver abnormality is seen. No gallstones, gallbladder wall thickening, or biliary dilatation. Pancreas: Unremarkable Spleen: Unremarkable Adrenals/Urinary Tract: The adrenal glands are unremarkable. The kidneys are normal in size and position. Probable vascular calcification within the lower pole of the left kidney. The kidneys are  otherwise unremarkable. There is moderate perivesicular inflammatory stranding present, new since prior examination and suggestive of an underlying diffuse infectious or inflammatory cystitis. The bladder is not distended Stomach/Bowel: Mild scattered distal colonic diverticulosis. The stomach, small bowel, and large bowel are otherwise unremarkable. Appendix absent. No free intraperitoneal gas or fluid. Vascular/Lymphatic: Mild aortoiliac atherosclerotic calcification. No aortic aneurysm. Reproductive: Prostate is unremarkable. Other: Rectum unremarkable.  No abdominal  wall hernia identified. Musculoskeletal: No lytic or blastic bone lesion. No acute bone abnormality. Advanced degenerative changes are seen at L2-3 with grade 1 anterolisthesis at this level this is unchanged from prior examination. Bilateral L2 pars defects are noted. IMPRESSION: Diffuse perivesicular inflammatory stranding, progressive since prior examination and suggestive of an underlying diffuse infectious or inflammatory cystitis. Correlation with urinalysis and urine culture may be helpful. No upper urinary tract inflammatory change identified. Extensive coronary artery calcification. Stable changes of L2 spondylolytic anterolisthesis with superimposed advanced degenerative disc disease at this level. Aortic Atherosclerosis (ICD10-I70.0). Electronically Signed   By: Fidela Salisbury MD   On: 11/15/2020 01:33   DG Chest 2 View  Result Date: 11/15/2020 CLINICAL DATA:  Tachycardia, myalgia EXAM: CHEST - 2 VIEW COMPARISON:  02/11/2017 FINDINGS: Mild bibasilar atelectasis. No pneumothorax or pleural effusion. Cardiac size within normal limits. Pulmonary vascularity is normal. Osseous structures are age-appropriate. IMPRESSION: Bibasilar atelectasis.  In Electronically Signed   By: Fidela Salisbury MD   On: 11/15/2020 01:24    Pertinent labs & imaging results that were available during my care of the patient were reviewed by me and considered in  my medical decision making (see chart for details).  Medications Ordered in ED Medications  sodium chloride 0.9 % bolus 1,000 mL (1,000 mLs Intravenous New Bag/Given 11/15/20 0324)    Followed by  sodium chloride 0.9 % bolus 1,000 mL (1,000 mLs Intravenous New Bag/Given 11/15/20 0324)    Followed by  0.9 %  sodium chloride infusion (has no administration in time range)  cefTRIAXone (ROCEPHIN) 1 g in sodium chloride 0.9 % 100 mL IVPB (0 g Intravenous Stopped 11/15/20 0407)  ketorolac (TORADOL) 15 MG/ML injection 15 mg (15 mg Intravenous Given 11/15/20 0324)                                                                                                                                    Procedures .1-3 Lead EKG Interpretation Performed by: Fatima Blank, MD Authorized by: Fatima Blank, MD     Interpretation: normal     ECG rate:  95   ECG rate assessment: normal     Rhythm: sinus rhythm     Ectopy: none     Conduction: normal   .Critical Care Performed by: Fatima Blank, MD Authorized by: Fatima Blank, MD   Critical care provider statement:    Critical care time (minutes):  45   Critical care was necessary to treat or prevent imminent or life-threatening deterioration of the following conditions:  Sepsis   Critical care was time spent personally by me on the following activities:  Discussions with consultants, evaluation of patient's response to treatment, examination of patient, ordering and performing treatments and interventions, ordering and review of laboratory studies, ordering and review of radiographic studies, pulse oximetry, re-evaluation of patient's condition, obtaining history from patient or surrogate and review of old charts    (  including critical care time)  Medical Decision Making / ED Course I have reviewed the nursing notes for this encounter and the patient's prior records (if available in EHR or on provided paperwork).   Leno Tasca was evaluated in Emergency Department on 11/15/2020 for the symptoms described in the history of present illness. He was evaluated in the context of the global COVID-19 pandemic, which necessitated consideration that the patient might be at risk for infection with the SARS-CoV-2 virus that causes COVID-19. Institutional protocols and algorithms that pertain to the evaluation of patients at risk for COVID-19 are in a state of rapid change based on information released by regulatory bodies including the CDC and federal and state organizations. These policies and algorithms were followed during the patient's care in the ED.  Work-up delayed due to prolonged wait times from boarding in the emergency department. Initial labs concerning for sepsis.  Work-up expanded to include chest x-ray and CT of the abdomen which revealed bladder wall thickening concerning for urinary tract infection.  Patient critically brought back to an open bed and started on empiric antibiotics.  Code sepsis was initiated.  Patient's blood pressure and lactic acid do not meet criteria for 30 cc/kg of IV fluid requirement.  He was however provided with IV fluid bolus.  UA is consistent with UTI.  Will admit for continued management.      Final Clinical Impression(s) / ED Diagnoses Final diagnoses:  Tachycardia  Sepsis with acute renal failure without septic shock, due to unspecified organism, unspecified acute renal failure type ([place])  Acute cystitis with hematuria      This chart was dictated using voice recognition software.  Despite best efforts to proofread,  errors can occur which can change the documentation meaning.   Fatima Blank, MD 11/15/20 [phone]

## 2020-11-15 NOTE — H&P (Signed)
History and Physical    Troy CarneWilliam Caniglia ZOX:[id number] DOB: Nov 27, 1950 DOA: 11/15/2020  PCP: Alysia PennaHolwerda, Scott, MD  Patient coming from: Home  Chief Complaint: stomach pain  HPI: Troy [name] is a 70 y.o. male with medical history significant of afib, CAD, HTN, HLD. Presenting with RLQ/LLQ abdominal pain for 4 days. It's a constant soreness. He is not aware of anything that makes it worse. APAP helped a little for a day or two; but then it was not effective. 2 days ago he woke up with chills and mild fever. His symptoms worsened through yesterday; so, he decided to come to the ED.    ED Course: He was found to have an elevated white count. Imaging showed acute cystitis. He was started on rocephin. [facility] was called for admission.   Review of Systems:  Denies CP, dyspnea, palpitations, N/V. Reports constipation. Review of systems is otherwise negative for all not mentioned in HPI.   PMHx Past Medical History:  Diagnosis Date  . Arthritis    "knees" (01/02/2017)  . Ascending aorta dilatation (HCC)     39mm by CT 2017 and 42mm by CT 2019, 43mm by echo 07/2020  . CAD (coronary artery disease)    a. [place]. b. Unstable angina/LHC 06/18/2015 complicated by V fib arrest after contrast injection. Cath 06/19/2015 s/p DES to mid LAD and RPDA. c. 11/2016: chest pain/ abnormal nuclear stress test prompting cath 11/13/16 showing 80% ostial diag, 60% mid RCA, no acute lesions, medical therapy recommended.  . Dyslipidemia   . Essential hypertension   . Lung nodule < 6cm on CT 06/16/15   Right lung  . Mitral regurgitation    mild to moderate by echo 07/2020  . OSA on CPAP   . Persistent atrial fibrillation (HCC)    a. 06/2015 noted to be in new a-fib when arrived with unstable angina. Converted to NSR after being shocked in the cath lab for vfib;  b. 06/2015 Eliquis initiated as PAF noted on event monitor. c. Recurrent atrial fib?flutter 10/2016, issues with recurrent AF requiring multiple DCCVs  in 02/2017. Failed sotalol, not candidate for Tikosyn due to cost/QTC, Multaq not felt likely strong enough.  . Pulmonary nodule 07/2015   a. 1.5 x 1.2 cm smoothly marginated nodule in the central aspect of the right lower lobe with recommendation correlation with nonemergent PET-CT to exclude a neoplasm , stable by CT 07/2016  . Severe left ventricular hypertrophy   . Ventricular fibrillation (HCC)    a. occured during cath 06/18/2015.    PSHx Past Surgical History:  Procedure Laterality Date  . CARDIAC CATHETERIZATION N/A 06/18/2015   Procedure: Left Heart Cath and Coronary Angiography;  Surgeon: Kathleene Hazelhristopher D McAlhany, MD;  [facility];  Service: Cardiovascular;  Laterality: N/A;  . CARDIAC CATHETERIZATION N/A 06/19/2015   Procedure: Coronary Stent Intervention;  Surgeon: Peter M SwazilandJordan, MD;  [facility];  Service: Cardiovascular;  Laterality: N/A;  . CARDIAC CATHETERIZATION N/A 11/13/2016   Procedure: Left Heart Cath and Coronary Angiography;  Surgeon: Runell GessJonathan J Berry, MD;  [facility];  Service: Cardiovascular;  Laterality: N/A;  . CARDIOVERSION N/A 11/17/2016   Procedure: CARDIOVERSION;  Surgeon: Laurey Moralealton S McLean, MD;  [facility];  Service: Cardiovascular;  Laterality: N/A;  . CARDIOVERSION N/A 01/03/2017   Procedure: CARDIOVERSION;  Surgeon: Lewayne BuntingBrian S Crenshaw, MD;  [facility];  Service: Cardiovascular;  Laterality: N/A;  . CARDIOVERSION N/A 02/05/2017  Procedure: Cardioversion;  Surgeon: Marinus Maw, MD;  Location: [facility] INVAS[facility] CV LAB;  Service: Cardiovascular;  Laterality: N/A;  . CARDIOVERSION N/A 02/20/2017   Procedure: Cardioversion;  Surgeon: Marinus Maw, MD;  Location: [facility] INVASIVE CV LAB;  Service: Cardiovascular;  Laterality: N/A;  . CARTILAGE SURGERY Bilateral    "thumbs"  . CATARACT EXTRACTION W/ INTRAOCULAR LENS  IMPLANT, BILATERAL Bilateral   . CORONARY ANGIOPLASTY WITH STENT PLACEMENT  ~ 2007  . JOINT  REPLACEMENT    . KNEE ARTHROSCOPY Right   . RETINAL DETACHMENT SURGERY Bilateral    "laser thing"  . TEE WITHOUT CARDIOVERSION N/A 11/17/2016   Procedure: TRANSESOPHAGEAL ECHOCARDIOGRAM (TEE);  Surgeon: Laurey Morale, MD;  Location: [facility] ENDOSCOPY;  Service: Cardiovascular;  Laterality: N/A;  . TOTAL KNEE ARTHROPLASTY Left 2000s  . TOTAL KNEE ARTHROPLASTY Right 02/08/2018   Procedure: RIGHT TOTAL KNEE ARTHROPLASTY;  Surgeon: Ollen Gross, MD;  Location: [facility] ORS;  Service: Orthopedics;  Laterality: Right;  with block    SocHx  reports that he has never smoked. He has never used smokeless tobacco. He reports current alcohol use of about 2.0 standard drinks of alcohol per week. He reports that he does not use drugs.  Allergies  Allergen Reactions  . Gluten Meal Other (See Comments)    REACTION: Celiac disease (severe stomach pain) REACTION: Celiac disease (severe stomach pain)   . Iodinated Diagnostic Agents Other (See Comments) and Anaphylaxis    V-Fib arrest during cardiac cath suspected d/t contrast dye in 2016 Cardiac arrest Cardiac arrest V-Fib arrest during cardiac cath suspected d/t contrast dye in 2016 Cardiac arrest  . Metrizamide Other (See Comments)    V-Fib arrest during cardiac cath suspected d/t contrast dye in 2016  . Tizanidine Anxiety    Depression  . Whey Other (See Comments)    REACTION: Celiac disease (severe stomach pain)  . Morphine And Related Other (See Comments)    REACTION: "REALLY BAD STOMACH PAIN"  . Apple Diarrhea  . Tizanidine Hcl Anxiety    Depression  . Wheat Bran Other (See Comments)    FamHx Family History  Problem Relation Age of Onset  . Stroke Mother   . Emphysema Mother   . Lung cancer Mother   . Heart attack Father   . Drug abuse Father   . Heart attack Maternal Grandfather   . Heart attack Paternal Grandfather   . Diabetes Sister     Prior to Admission medications   Medication Sig Start Date End Date Taking? Authorizing Provider   atorvastatin (LIPITOR) 80 MG tablet Take 1 tablet (80 mg total) by mouth daily. 11/18/17   Dunn, Tacey Ruiz, PA-C  ezetimibe (ZETIA) 10 MG tablet TAKE 1 TABLET(10 MG) BY MOUTH DAILY 01/30/20   Quintella Reichert, MD  Fluorouracil (TOLAK) 4 % CREA Apply 1 application topically at bedtime. 10/10/20   Janalyn Harder, MD  gabapentin (NEURONTIN) 300 MG capsule Take 1 capsule (300 mg total) by mouth 3 (three) times daily. Gabapentin 300 mg Protocol Take a 300 mg capsule three times a day for two weeks, Then a 300 mg capsule twice a day for two weeks, Then a 300 mg capsule once a day for two weeks, then discontinue the Gabapentin. 02/11/18   Perkins, Alexzandrew L, PA-C  LORazepam (ATIVAN) 0.5 MG tablet Take 0.5 mg by mouth daily as needed for anxiety.    [provider]  losartan (COZAAR) 100 MG tablet TAKE 1 TABLET(100 MG) BY MOUTH DAILY 06/27/20  Camnitz, Will Daphine DeutscherMartin, MD  methocarbamol (ROBAXIN) 500 MG tablet Take 1 tablet (500 mg total) by mouth every 6 (six) hours as needed for muscle spasms. 02/11/18   Perkins, Alexzandrew L, PA-C  metoprolol tartrate (LOPRESSOR) 100 MG tablet TAKE 1 TABLET(100 MG) BY MOUTH TWICE DAILY 01/30/20   Camnitz, Andree CossWill Martin, MD  nitroGLYCERIN (NITROSTAT) 0.3 MG SL tablet Place 1 tablet (0.3 mg total) under the tongue every 5 (five) minutes as needed for chest pain. 01/12/17   Newman Niparroll, Donna C, NP  oxymetazoline (AFRIN) 0.05 % nasal spray Place 1 spray into both nostrils 2 (two) times daily as needed (FOR SINUS CONGESTION.). 02/09/18   Perkins, Alexzandrew L, PA-C  spironolactone (ALDACTONE) 25 MG tablet TAKE 1 TABLET(25 MG) BY MOUTH DAILY 04/27/20   Lyn RecordsSmith, Henry W, MD  warfarin (COUMADIN) 5 MG tablet TAKE AS DIRECTED BY ANTICOAGULATION CLINIC 10/10/20   Regan Lemmingamnitz, Will Martin, MD    Physical Exam: Vitals:   11/15/20 0330 11/15/20 0430 11/15/20 0500 11/15/20 0530  BP: (!) 136/92 134/87 (!) 136/93 133/90  Pulse: 70 (!) 106 (!) 111 (!) 106  Resp: (!) 22 14 19  (!) 21  Temp:       TempSrc:      SpO2: 97% 96% 96% 98%    General: 70 y.o. male resting in bed in NAD Eyes: PERRL, normal sclera ENMT: Nares patent w/o discharge, orophaynx clear, dentition normal, ears w/o discharge/lesions/ulcers Neck: Supple, trachea midline Cardiovascular: tachy irregular, +S1, S2, no m/g/r, equal pulses throughout Respiratory: CTABL, no w/r/r, normal WOB GI: BS+, ND, RLQ/LLQ TTP, no masses noted, no organomegaly noted MSK: No e/c/c Skin: No rashes, bruises, ulcerations noted Neuro: A&O x 3, no focal deficits Psyc: Appropriate interaction and affect, calm/cooperative  Labs on Admission: I have personally reviewed following labs and imaging studies  CBC: Recent Labs  Lab 11/14/20 2244  WBC 18.1*  NEUTROABS 14.6*  HGB 16.4  HCT 49.2  MCV 90.9  PLT 141*   Basic Metabolic Panel: Recent Labs  Lab 11/14/20 2244  NA 137  K 3.9  CL 101  CO2 23  GLUCOSE 141*  BUN 18  CREATININE 1.30*  CALCIUM 9.5   GFR: CrCl cannot be calculated (Unknown ideal weight.). Liver Function Tests: Recent Labs  Lab 11/14/20 2244  AST 28  ALT 26  ALKPHOS 38  BILITOT 2.6*  PROT 8.1  ALBUMIN 4.5   Recent Labs  Lab 11/14/20 2244  LIPASE 27   No results for input(s): AMMONIA in the last 168 hours. Coagulation Profile: No results for input(s): INR, PROTIME in the last 168 hours. Cardiac Enzymes: No results for input(s): CKTOTAL, CKMB, CKMBINDEX, TROPONINI in the last 168 hours. BNP (last 3 results) No results for input(s): PROBNP in the last 8760 hours. HbA1C: No results for input(s): HGBA1C in the last 72 hours. CBG: No results for input(s): GLUCAP in the last 168 hours. Lipid Profile: No results for input(s): CHOL, HDL, LDLCALC, TRIG, CHOLHDL, LDLDIRECT in the last 72 hours. Thyroid Function Tests: No results for input(s): TSH, T4TOTAL, FREET4, T3FREE, THYROIDAB in the last 72 hours. Anemia Panel: No results for input(s): VITAMINB12, FOLATE, FERRITIN, TIBC, IRON, RETICCTPCT  in the last 72 hours. Urine analysis:    Component Value Date/Time   COLORURINE AMBER (A) 11/15/2020 0300   APPEARANCEUR CLOUDY (A) 11/15/2020 0300   LABSPEC 1.033 (H) 11/15/2020 0300   PHURINE 5.0 11/15/2020 0300   GLUCOSEU NEGATIVE 11/15/2020 0300   HGBUR SMALL (A) 11/15/2020 0300   BILIRUBINUR NEGATIVE 11/15/2020  0300   KETONESUR 5 (A) 11/15/2020 0300   PROTEINUR 100 (A) 11/15/2020 0300   NITRITE NEGATIVE 11/15/2020 0300   LEUKOCYTESUR LARGE (A) 11/15/2020 0300    Radiological Exams on Admission: CT ABDOMEN PELVIS WO CONTRAST  Result Date: 11/15/2020 CLINICAL DATA:  Abdominal pain, fever, leukocytosis, lactic acidosis EXAM: CT ABDOMEN AND PELVIS WITHOUT CONTRAST TECHNIQUE: Multidetector CT imaging of the abdomen and pelvis was performed following the standard protocol without IV contrast. COMPARISON:  07/29/2019 FINDINGS: Lower chest: The visualized lung bases are clear bilaterally. Extensive coronary artery calcification. Global cardiac size within normal limits. Hepatobiliary: No focal liver abnormality is seen. No gallstones, gallbladder wall thickening, or biliary dilatation. Pancreas: Unremarkable Spleen: Unremarkable Adrenals/Urinary Tract: The adrenal glands are unremarkable. The kidneys are normal in size and position. Probable vascular calcification within the lower pole of the left kidney. The kidneys are otherwise unremarkable. There is moderate perivesicular inflammatory stranding present, new since prior examination and suggestive of an underlying diffuse infectious or inflammatory cystitis. The bladder is not distended Stomach/Bowel: Mild scattered distal colonic diverticulosis. The stomach, small bowel, and large bowel are otherwise unremarkable. Appendix absent. No free intraperitoneal gas or fluid. Vascular/Lymphatic: Mild aortoiliac atherosclerotic calcification. No aortic aneurysm. Reproductive: Prostate is unremarkable. Other: Rectum unremarkable.  No abdominal wall hernia  identified. Musculoskeletal: No lytic or blastic bone lesion. No acute bone abnormality. Advanced degenerative changes are seen at L2-3 with grade 1 anterolisthesis at this level this is unchanged from prior examination. Bilateral L2 pars defects are noted. IMPRESSION: Diffuse perivesicular inflammatory stranding, progressive since prior examination and suggestive of an underlying diffuse infectious or inflammatory cystitis. Correlation with urinalysis and urine culture may be helpful. No upper urinary tract inflammatory change identified. Extensive coronary artery calcification. Stable changes of L2 spondylolytic anterolisthesis with superimposed advanced degenerative disc disease at this level. Aortic Atherosclerosis (ICD10-I70.0). Electronically Signed   By: Fidela Salisbury MD   On: 11/15/2020 01:33   DG Chest 2 View  Result Date: 11/15/2020 CLINICAL DATA:  Tachycardia, myalgia EXAM: CHEST - 2 VIEW COMPARISON:  02/11/2017 FINDINGS: Mild bibasilar atelectasis. No pneumothorax or pleural effusion. Cardiac size within normal limits. Pulmonary vascularity is normal. Osseous structures are age-appropriate. IMPRESSION: Bibasilar atelectasis.  In Electronically Signed   By: Fidela Salisbury MD   On: 11/15/2020 01:24    Assessment/Plan Sepsis secondary to UTI     - admit to inpt     - continue rocephin, fluids     - follow lactic acid, UCx, Bld Cx  AKI     - fluids, US renal     - hold ARB/spironolactone, watch nephrotoxins     - baseline SCr appears to be around 0.8.   Thrombocytopenia (mild)     - no evidence of bleed, follow  Hyperbilirubinemia     - check fractionated bili; trend, [place] ab  CAD     - doesn't look like he's taking ASA or statin; not sure why; follow up  HTN     - holding ARB and spironolactone d/t AKI, can continue metoprolol at reduced does  Permanent afib     - continue coumadin     - continue metoprolol at reduced dose and titrate up as BP tolerates    DVT prophylaxis:  coumadin Code Status: DNR, confirmed with patient  Family Communication: None at bedside.  Consults called: None   Status is: Inpatient  Remains inpatient appropriate because:Inpatient level of care appropriate due to severity of illness   Dispo: The patient  is from: Home              Anticipated d/c is to: Home              Anticipated d/c date is: 3 days              Patient currently is not medically stable to d/c.  Jonnie Finner DO [facility]  If 7PM-7AM, please contact night-coverage [email or web address]  11/15/2020, 7:46 AM

## 2020-11-16 DIAGNOSIS — A419 Sepsis, unspecified organism: Principal | ICD-10-CM

## 2020-11-16 DIAGNOSIS — D696 Thrombocytopenia, unspecified: Secondary | ICD-10-CM

## 2020-11-16 DIAGNOSIS — N179 Acute kidney failure, unspecified: Secondary | ICD-10-CM

## 2020-11-16 DIAGNOSIS — R6520 Severe sepsis without septic shock: Secondary | ICD-10-CM

## 2020-11-16 LAB — COMPREHENSIVE METABOLIC PANEL
ALT: 18 U/L (ref 0–44)
AST: 18 U/L (ref 15–41)
Albumin: 3.4 g/dL — ABNORMAL LOW (ref 3.5–5.0)
Alkaline Phosphatase: 32 U/L — ABNORMAL LOW (ref 38–126)
Anion gap: 8 (ref 5–15)
BUN: 15 mg/dL (ref 8–23)
CO2: 23 mmol/L (ref 22–32)
Calcium: 8.3 mg/dL — ABNORMAL LOW (ref 8.9–10.3)
Chloride: 107 mmol/L (ref 98–111)
Creatinine, Ser: 0.91 mg/dL (ref 0.61–1.24)
GFR, Estimated: >60 mL/min (ref >60)
Glucose, Bld: 106 mg/dL — ABNORMAL HIGH (ref 70–99)
Potassium: 3.9 mmol/L (ref 3.5–5.1)
Sodium: 138 mmol/L (ref 135–145)
Total Bilirubin: 1 mg/dL (ref 0.3–1.2)
Total Protein: 6.2 g/dL — ABNORMAL LOW (ref 6.5–8.1)

## 2020-11-16 LAB — BLOOD CULTURE ID PANEL (REFLEXED) - BCID2

## 2020-11-16 LAB — CBC
HCT: 39.2 % (ref 39.0–52.0)
Hemoglobin: 12.9 g/dL — ABNORMAL LOW (ref 13.0–17.0)
MCH: 30.7 pg (ref 26.0–34.0)
MCHC: 32.9 g/dL (ref 30.0–36.0)
MCV: 93.3 fL (ref 80.0–100.0)
Platelets: 109 10*3/uL — ABNORMAL LOW (ref 150–400)
RBC: 4.2 MIL/uL — ABNORMAL LOW (ref 4.22–5.81)
RDW: 13.3 % (ref 11.5–15.5)
WBC: 10.6 10*3/uL — ABNORMAL HIGH (ref 4.0–10.5)
nRBC: 0 % (ref 0.0–0.2)

## 2020-11-16 LAB — PROTIME-INR
INR: 1.7 — ABNORMAL HIGH (ref 0.8–1.2)
Prothrombin Time: 18.9 seconds — ABNORMAL HIGH (ref 11.4–15.2)

## 2020-11-16 MED ORDER — HYDROCODONE-ACETAMINOPHEN 5-325 MG PO TABS
1.0000 | ORAL_TABLET | Freq: Four times a day (QID) | ORAL | Status: DC | PRN
  Administered 2020-11-16 – 2020-11-17 (×2): 2 via ORAL
  Filled 2020-11-16 (×3): qty 2

## 2020-11-16 MED ORDER — WARFARIN SODIUM 5 MG PO TABS
7.5000 mg | ORAL_TABLET | Freq: Once | ORAL | Status: AC
  Administered 2020-11-16: 7.5 mg via ORAL
  Filled 2020-11-16: qty 1

## 2020-11-16 NOTE — Progress Notes (Signed)
PROGRESS NOTE    Troy [name]  [id number] DOB: 1951/01/31 DOA: 11/15/2020 PCP: Velna Hatchet, MD   Brief Narrative: Troy [name] is a 70 y.o. male with medical history significant of afib, CAD, HTN, HLD. Patient presented with lower abdominal pain with evidence of sepsis on admission likely secondary to a UTI. Antibiotics initiated.   Assessment & Plan:   Active Problems:   Sepsis ([place])   AKI (acute kidney injury) ([place])   Thrombocytopenia ([place])   Sepsis Present on admission. Patient with dysuria. Secondary to UTI as possible source. Urine culture obtained and pending. Started on empiric Ceftriaxone -Follow-up cultures -Continue Ceftriaxone  AKI Baseline creatinine of 0.8-0.9. Creatinine of 1.3 on admission. Resolved with IV fluids  Thrombocytopenia Unsure if acute or chronic but there is a drop from 141 on admission to 109 today. In setting of infection/sepsis -CBC in AM  Primary hypertension -Continue metoprolol  Permanent atrial fibrillation -Continue Coumadin and metoprolol   DVT prophylaxis: Coumadin Code Status:   Code Status: DNR Family Communication: Significant other at bedside Disposition Plan: Discharge likely in 2 days   Consultants:   None  Procedures:   None  Antimicrobials:  Ceftriaxone    Subjective: Some lower abdominal discomfort  Objective: Vitals:   11/16/20 0616 11/16/20 0700 11/16/20 0924 11/16/20 1401  BP:   121/80 133/88  Pulse:   82 88  Resp:   18 14  Temp:   99.6 F (37.6 C) 98.7 F (37.1 C)  TempSrc:   Oral Oral  SpO2:   97% 96%  Weight: 11.5 kg 114.7 kg    Height: 5\' 11"  (1.803 m) 5\' 11"  (1.803 m)      Intake/Output Summary (Last 24 hours) at 11/16/2020 1604 Last data filed at 11/16/2020 0703 Gross per 24 hour  Intake 800 ml  Output --  Net 800 ml   Filed Weights   11/16/20 0616 11/16/20 0700  Weight: 11.5 kg 114.7 kg    Examination:  General exam: Appears calm and comfortable Respiratory  system: Clear to auscultation. Respiratory effort normal. Cardiovascular system: S1 & S2 heard, RRR. Gastrointestinal system: Abdomen is slightly full in lower quadrants, soft and slightly tender. No organomegaly or masses felt. Normal bowel sounds heard. Central nervous system: Alert and oriented. No focal neurological deficits. Musculoskeletal: No edema. No calf tenderness Skin: No cyanosis. No rashes Psychiatry: Judgement and insight appear normal. Mood & affect appropriate.     Data Reviewed: I have personally reviewed following labs and imaging studies  CBC Lab Results  Component Value Date   WBC 10.6 (H) 11/16/2020   RBC 4.20 (L) 11/16/2020   HGB 12.9 (L) 11/16/2020   HCT 39.2 11/16/2020   MCV 93.3 11/16/2020   MCH 30.7 11/16/2020   PLT 109 (L) 11/16/2020   MCHC 32.9 11/16/2020   RDW 13.3 11/16/2020   LYMPHSABS 1.3 11/14/2020   MONOABS 2.0 (H) 11/14/2020   EOSABS 0.0 11/14/2020   BASOSABS 0.0 123456     Last metabolic panel Lab Results  Component Value Date   NA 138 11/16/2020   K 3.9 11/16/2020   CL 107 11/16/2020   CO2 23 11/16/2020   BUN 15 11/16/2020   CREATININE 0.91 11/16/2020   GLUCOSE 106 (H) 11/16/2020   GFRNONAA >60 11/16/2020   GFRAA >60 02/10/2018   CALCIUM 8.3 (L) 11/16/2020   PROT 6.2 (L) 11/16/2020   ALBUMIN 3.4 (L) 11/16/2020   LABGLOB 2.0 11/16/2017   AGRATIO 2.3 (H) 11/16/2017   BILITOT 1.0 11/16/2020  ALKPHOS 32 (L) 11/16/2020   AST 18 11/16/2020   ALT 18 11/16/2020   ANIONGAP 8 11/16/2020    CBG (last 3)  No results for input(s): GLUCAP in the last 72 hours.   GFR: Estimated Creatinine Clearance: 95.9 mL/min (by C-G formula based on SCr of 0.91 mg/dL).  Coagulation Profile: Recent Labs  Lab 11/15/20 1049 11/16/20 0457  INR 1.7* 1.7*    Recent Results (from the past 240 hour(s))  Resp Panel by RT-PCR (Flu A&B, Covid) Nasopharyngeal Swab     Status: None   Collection Time: 11/14/20  8:18 PM   Specimen: Nasopharyngeal  Swab; Nasopharyngeal(NP) swabs in vial transport medium  Result Value Ref Range Status   SARS Coronavirus 2 by RT PCR NEGATIVE NEGATIVE Final    Comment: (NOTE) SARS-CoV-2 target nucleic acids are NOT DETECTED.  The SARS-CoV-2 RNA is generally detectable in upper respiratory specimens during the acute phase of infection. The lowest concentration of SARS-CoV-2 viral copies this assay can detect is 138 copies/mL. A negative result does not preclude SARS-Cov-2 infection and should not be used as the sole basis for treatment or other patient management decisions. A negative result may occur with  improper specimen collection/handling, submission of specimen other than nasopharyngeal swab, presence of viral mutation(s) within the areas targeted by this assay, and inadequate number of viral copies(<138 copies/mL). A negative result must be combined with clinical observations, patient history, and epidemiological information. The expected result is Negative.  Fact Sheet for Patients:  EntrepreneurPulse.com.au  Fact Sheet for Healthcare Providers:  IncredibleEmployment.be  This test is no t yet approved or cleared by the [place] FDA and  has been authorized for detection and/or diagnosis of SARS-CoV-2 by FDA under an Emergency Use Authorization (EUA). This EUA will remain  in effect (meaning this test can be used) for the duration of the COVID-19 declaration under Section 564(b)(1) of the Act, 21 U.S.C.section 360bbb-3(b)(1), unless the authorization is terminated  or revoked sooner.       Influenza A by PCR NEGATIVE NEGATIVE Final   Influenza B by PCR NEGATIVE NEGATIVE Final    Comment: (NOTE) The Xpert Xpress SARS-CoV-2/FLU/RSV plus assay is intended as an aid in the diagnosis of influenza from Nasopharyngeal swab specimens and should not be used as a sole basis for treatment. Nasal washings and aspirates are unacceptable for Xpert Xpress  SARS-CoV-2/FLU/RSV testing.  Fact Sheet for Patients: EntrepreneurPulse.com.au  Fact Sheet for Healthcare Providers: IncredibleEmployment.be  This test is not yet approved or cleared by the [place] FDA and has been authorized for detection and/or diagnosis of SARS-CoV-2 by FDA under an Emergency Use Authorization (EUA). This EUA will remain in effect (meaning this test can be used) for the duration of the COVID-19 declaration under Section 564(b)(1) of the Act, 21 U.S.C. section 360bbb-3(b)(1), unless the authorization is terminated or revoked.  Performed at [facility], [place] [address] [place], [place] [address]   Urine culture     Status: Abnormal (Preliminary result)   Collection Time: 11/15/20  2:00 AM   Specimen: Urine, Clean Catch  Result Value Ref Range Status   Specimen Description   Final    URINE, CLEAN CATCH Performed at [facility], [place] [address], [place], [place] [address]    Special Requests   Final    NONE Performed at [facility], [place] [address] [place], [place] [address]    Culture (A)  Final    >=100,000 COLONIES/mL CITROBACTER  KOSERI SUSCEPTIBILITIES TO FOLLOW Performed at [facility], [place] [address], [place], [place] [address]    Report Status PENDING  Incomplete  Blood culture (routine x 2)     Status: None (Preliminary result)   Collection Time: 11/15/20  3:31 AM   Specimen: BLOOD  Result Value Ref Range Status   Specimen Description   Final    BLOOD LEFT ANTECUBITAL Performed at [place] [address], [place], [place] [address]    Special Requests   Final    BOTTLES DRAWN AEROBIC AND ANAEROBIC Blood Culture adequate volume Performed at [place] [address], [place], [place] [address]    Culture  Setup Time   Final    AEROBIC BOTTLE ONLY GRAM POSITIVE COCCI Organism ID to  follow CRITICAL RESULT CALLED TO, READ BACK BY AND VERIFIED WITH: L POINDEXTER PHARMD 11/16/20 0511 JDW Performed at [facility], [address] [place], [place] [address]    Culture GRAM POSITIVE COCCI  Final   Report Status PENDING  Incomplete  Blood culture (routine x 2)     Status: None (Preliminary result)   Collection Time: 11/15/20  3:31 AM   Specimen: BLOOD RIGHT HAND  Result Value Ref Range Status   Specimen Description   Final    BLOOD RIGHT HAND Performed at [facility], [place] [address] [place], [place] [address]    Special Requests   Final    BOTTLES DRAWN AEROBIC AND ANAEROBIC Blood Culture adequate volume Performed at [place] [address], [place], [place] [address]    Culture   Final    NO GROWTH < 12 HOURS Performed at [place] [address], [place], [place] [address]    Report Status PENDING  Incomplete  Blood Culture ID Panel (Reflexed)     Status: Abnormal   Collection Time: 11/15/20  3:31 AM  Result Value Ref Range Status   Enterococcus faecalis NOT DETECTED NOT DETECTED Final   Enterococcus Faecium NOT DETECTED NOT DETECTED Final   Listeria monocytogenes NOT DETECTED NOT DETECTED Final   Staphylococcus species DETECTED (A) NOT DETECTED Final    Comment: CRITICAL RESULT CALLED TO, READ BACK BY AND VERIFIED WITH: L  POINDEXTER PHARMD 11/16/20 0511 JDW    Staphylococcus aureus (BCID) NOT DETECTED NOT DETECTED Final   Staphylococcus epidermidis NOT DETECTED NOT DETECTED Final   Staphylococcus lugdunensis NOT DETECTED NOT DETECTED Final   Streptococcus species NOT DETECTED NOT DETECTED Final   Streptococcus agalactiae NOT DETECTED NOT DETECTED Final   Streptococcus pneumoniae NOT DETECTED NOT DETECTED Final   Streptococcus pyogenes NOT DETECTED NOT DETECTED Final   A.calcoaceticus-baumannii NOT DETECTED NOT DETECTED Final   Bacteroides fragilis NOT DETECTED NOT DETECTED Final   Enterobacterales NOT DETECTED  NOT DETECTED Final   Enterobacter cloacae complex NOT DETECTED NOT DETECTED Final   Escherichia coli NOT DETECTED NOT DETECTED Final   Klebsiella aerogenes NOT DETECTED NOT DETECTED Final   Klebsiella oxytoca NOT DETECTED NOT DETECTED Final   Klebsiella pneumoniae NOT DETECTED NOT DETECTED Final   Proteus species NOT DETECTED NOT DETECTED Final   Salmonella species NOT DETECTED NOT DETECTED Final   Serratia marcescens NOT DETECTED NOT DETECTED Final   Haemophilus influenzae NOT DETECTED NOT DETECTED Final   Neisseria meningitidis NOT DETECTED NOT DETECTED Final   Pseudomonas aeruginosa NOT DETECTED NOT DETECTED Final   Stenotrophomonas maltophilia NOT DETECTED NOT DETECTED Final   Candida  albicans NOT DETECTED NOT DETECTED Final   Candida auris NOT DETECTED NOT DETECTED Final   Candida glabrata NOT DETECTED NOT DETECTED Final   Candida krusei NOT DETECTED NOT DETECTED Final   Candida parapsilosis NOT DETECTED NOT DETECTED Final   Candida tropicalis NOT DETECTED NOT DETECTED Final   Cryptococcus neoformans/gattii NOT DETECTED NOT DETECTED Final    Comment: Performed at [facility], [place] [address], [place], [place] [address]        Radiology Studies: CT ABDOMEN PELVIS WO CONTRAST  Result Date: 11/15/2020 CLINICAL DATA:  Abdominal pain, fever, leukocytosis, lactic acidosis EXAM: CT ABDOMEN AND PELVIS WITHOUT CONTRAST TECHNIQUE: Multidetector CT imaging of the abdomen and pelvis was performed following the standard protocol without IV contrast. COMPARISON:  07/29/2019 FINDINGS: Lower chest: The visualized lung bases are clear bilaterally. Extensive coronary artery calcification. Global cardiac size within normal limits. Hepatobiliary: No focal liver abnormality is seen. No gallstones, gallbladder wall thickening, or biliary dilatation. Pancreas: Unremarkable Spleen: Unremarkable Adrenals/Urinary Tract: The adrenal glands are unremarkable. The kidneys are normal in size and position.  Probable vascular calcification within the lower pole of the left kidney. The kidneys are otherwise unremarkable. There is moderate perivesicular inflammatory stranding present, new since prior examination and suggestive of an underlying diffuse infectious or inflammatory cystitis. The bladder is not distended Stomach/Bowel: Mild scattered distal colonic diverticulosis. The stomach, small bowel, and large bowel are otherwise unremarkable. Appendix absent. No free intraperitoneal gas or fluid. Vascular/Lymphatic: Mild aortoiliac atherosclerotic calcification. No aortic aneurysm. Reproductive: Prostate is unremarkable. Other: Rectum unremarkable.  No abdominal wall hernia identified. Musculoskeletal: No lytic or blastic bone lesion. No acute bone abnormality. Advanced degenerative changes are seen at L2-3 with grade 1 anterolisthesis at this level this is unchanged from prior examination. Bilateral L2 pars defects are noted. IMPRESSION: Diffuse perivesicular inflammatory stranding, progressive since prior examination and suggestive of an underlying diffuse infectious or inflammatory cystitis. Correlation with urinalysis and urine culture may be helpful. No upper urinary tract inflammatory change identified. Extensive coronary artery calcification. Stable changes of L2 spondylolytic anterolisthesis with superimposed advanced degenerative disc disease at this level. Aortic Atherosclerosis (ICD10-I70.0). Electronically Signed   By: Fidela Salisbury MD   On: 11/15/2020 01:33   DG Chest 2 View  Result Date: 11/15/2020 CLINICAL DATA:  Tachycardia, myalgia EXAM: CHEST - 2 VIEW COMPARISON:  02/11/2017 FINDINGS: Mild bibasilar atelectasis. No pneumothorax or pleural effusion. Cardiac size within normal limits. Pulmonary vascularity is normal. Osseous structures are age-appropriate. IMPRESSION: Bibasilar atelectasis.  In Electronically Signed   By: Fidela Salisbury MD   On: 11/15/2020 01:24   US Abdomen Complete  Result  Date: 11/15/2020 CLINICAL DATA:  Acute kidney injury, elevated bilirubin levels EXAM: ABDOMEN ULTRASOUND COMPLETE COMPARISON:  CT 11/15/2020 FINDINGS: Gallbladder: No gallstones or wall thickening visualized. No sonographic [place] sign noted by sonographer. Common bile duct: Diameter: 5 mm. Liver: No focal lesion identified. Mildly increased hepatic parenchymal echogenicity. Portal vein is patent on color Doppler imaging with normal direction of blood flow towards the liver. IVC: No abnormality visualized. Pancreas: Visualized portion unremarkable. Spleen: Size and appearance within normal limits. Right Kidney: Length: 10.6 cm. Echogenicity within normal limits. No mass or hydronephrosis visualized. Left Kidney: Length: 11.7 cm. Echogenicity within normal limits. No mass or hydronephrosis visualized. Abdominal aorta: No aneurysm visualized. Other findings: None. IMPRESSION: 1. The echogenicity of the liver is mildly increased. This is a nonspecific finding but is most commonly seen with fatty infiltration of the liver. There are  no obvious focal liver lesions. 2. Remainder of the examination is within normal limits. Negative for obstructive uropathy. Electronically Signed   By: Davina Poke D.O.   On: 11/15/2020 10:19        Scheduled Meds: . metoprolol tartrate  25 mg Oral Once  . metoprolol tartrate  50 mg Oral BID  . warfarin  7.5 mg Oral ONCE-1600  . Warfarin - Pharmacist Dosing Inpatient   Does not apply q1600   Continuous Infusions: . sodium chloride Stopped (11/15/20 1510)  . cefTRIAXone (ROCEPHIN)  IV 1 g (11/16/20 4481)     LOS: 1 day     Cordelia Poche, MD [facility] 11/16/2020, 4:04 PM  If 7PM-7AM, please contact night-coverage [email or web address]

## 2020-11-16 NOTE — Progress Notes (Signed)
PHARMACY - PHYSICIAN COMMUNICATION CRITICAL VALUE ALERT - BLOOD CULTURE IDENTIFICATION (BCID)  Troy [name] is an 70 y.o. male who presented to [facility] on 11/15/2020 with a chief complaint of sepsis due to UTI  Assessment:  Staph species (no organism identified with no methicillin resistance detected) in 1 of 4 bottles. Patient afebrile. Suspect likely contaminant  Name of physician (or Provider) Contacted: X. Blount, FNP  Current antibiotics: Ceftriaxone 1gm IV q24h  Changes to prescribed antibiotics recommended:  Patient is on recommended antibiotics - No changes needed  Results for orders placed or performed during the hospital encounter of 11/15/20  Blood Culture ID Panel (Reflexed) (Collected: 11/15/2020  3:31 AM)  Result Value Ref Range   Enterococcus faecalis NOT DETECTED NOT DETECTED   Enterococcus Faecium NOT DETECTED NOT DETECTED   Listeria monocytogenes NOT DETECTED NOT DETECTED   Staphylococcus species DETECTED (A) NOT DETECTED   Staphylococcus aureus (BCID) NOT DETECTED NOT DETECTED   Staphylococcus epidermidis NOT DETECTED NOT DETECTED   Staphylococcus lugdunensis NOT DETECTED NOT DETECTED   Streptococcus species NOT DETECTED NOT DETECTED   Streptococcus agalactiae NOT DETECTED NOT DETECTED   Streptococcus pneumoniae NOT DETECTED NOT DETECTED   Streptococcus pyogenes NOT DETECTED NOT DETECTED   A.calcoaceticus-baumannii NOT DETECTED NOT DETECTED   Bacteroides fragilis NOT DETECTED NOT DETECTED   Enterobacterales NOT DETECTED NOT DETECTED   Enterobacter cloacae complex NOT DETECTED NOT DETECTED   Escherichia coli NOT DETECTED NOT DETECTED   Klebsiella aerogenes NOT DETECTED NOT DETECTED   Klebsiella oxytoca NOT DETECTED NOT DETECTED   Klebsiella pneumoniae NOT DETECTED NOT DETECTED   Proteus species NOT DETECTED NOT DETECTED   Salmonella species NOT DETECTED NOT DETECTED   Serratia marcescens NOT DETECTED NOT DETECTED   Haemophilus influenzae NOT DETECTED NOT  DETECTED   Neisseria meningitidis NOT DETECTED NOT DETECTED   Pseudomonas aeruginosa NOT DETECTED NOT DETECTED   Stenotrophomonas maltophilia NOT DETECTED NOT DETECTED   Candida albicans NOT DETECTED NOT DETECTED   Candida auris NOT DETECTED NOT DETECTED   Candida glabrata NOT DETECTED NOT DETECTED   Candida krusei NOT DETECTED NOT DETECTED   Candida parapsilosis NOT DETECTED NOT DETECTED   Candida tropicalis NOT DETECTED NOT DETECTED   Cryptococcus neoformans/gattii NOT DETECTED NOT DETECTED    Everette Rank, PharmD 11/16/2020  5:33 AM

## 2020-11-16 NOTE — Progress Notes (Signed)
ANTICOAGULATION CONSULT NOTE - Follow Up Consult  Pharmacy Consult for warfarin Indication: atrial fibrillation  Allergies  Allergen Reactions  . Gluten Meal Other (See Comments)    REACTION: Celiac disease (severe stomach pain) REACTION: Celiac disease (severe stomach pain)   . Iodinated Diagnostic Agents Other (See Comments) and Anaphylaxis    V-Fib arrest during cardiac cath suspected d/t contrast dye in 2016 Cardiac arrest Cardiac arrest V-Fib arrest during cardiac cath suspected d/t contrast dye in 2016 Cardiac arrest  . Metrizamide Other (See Comments)    V-Fib arrest during cardiac cath suspected d/t contrast dye in 2016  . Tizanidine Anxiety    Depression  . Whey Other (See Comments)    REACTION: Celiac disease (severe stomach pain)  . Morphine And Related Other (See Comments)    REACTION: "REALLY BAD STOMACH PAIN"  . Apple Diarrhea  . Tizanidine Hcl Anxiety    Depression  . Wheat Bran Other (See Comments)    Vital Signs: Temp: 99.6 F (37.6 C) (01/07 0608) Temp Source: Oral (01/07 0608) BP: 131/95 (01/07 0608) Pulse Rate: 79 (01/07 0608)  Labs: Recent Labs    11/14/20 2244 11/15/20 1049 11/16/20 0457  HGB 16.4  --  12.9*  HCT 49.2  --  39.2  PLT 141*  --  109*  LABPROT  --  19.3* 18.9*  INR  --  1.7* 1.7*  CREATININE 1.30*  --  0.91    Estimated Creatinine Clearance: 12.1 mL/min (by C-G formula based on SCr of 0.91 mg/dL).  Medications:  Warfarin 7.5 mg daily except 10 mg on Saturday  Assessment: 70 y/o M with a h/o atrial fibrillation on warfarin PTA admitted with stomach pain. Patient is being treated for sepsis secondary to UTI and hyperbilirubinemia. Pharmacy consulted to continue warfarin dosing. Last dose of warfarin 1/4 per med history.   11/16/20 7:36 AM   INR sub-therapeutic at 1.7, did miss dose 1/5 apparently  CBC: Hgb decreased 12.9, Plts low 109k - no bleeding documented  Patient is on ceftriaxone which may potentiate  warfarin  Heart healthy diet ordered, no intake recorded  Goal of Therapy:  INR 2-3   Plan:  Warfarin 7.5 mg once today F/U INR  Peggyann Juba, PharmD, BCPS Pharmacy: [phone] 11/16/2020,7:36 AM

## 2020-11-16 NOTE — TOC Progression Note (Signed)
Transition of Care [facility]) - Progression Note    Patient Details  Name: Troy [name] MRN: [id number] Date of Birth: 1951/08/02  [facility] CM/SW Contact  Purcell Mouton, RN Phone Number: 11/16/2020, 10:05 AM  Clinical Narrative:     TOC will continue to follow for discharge needs.  Expected Discharge Plan: Home/Self Care Barriers to Discharge: No Barriers Identified  Expected Discharge Plan and Services Expected Discharge Plan: Home/Self Care       Living arrangements for the past 2 months: Single Family Home                                       Social Determinants of Health (SDOH) Interventions    Readmission Risk Interventions No flowsheet data found.

## 2020-11-17 DIAGNOSIS — N3000 Acute cystitis without hematuria: Secondary | ICD-10-CM

## 2020-11-17 DIAGNOSIS — N3001 Acute cystitis with hematuria: Secondary | ICD-10-CM

## 2020-11-17 LAB — URINE CULTURE: Culture: >=100000 — AB

## 2020-11-17 LAB — CBC
HCT: 39.2 % (ref 39.0–52.0)
Hemoglobin: 12.9 g/dL — ABNORMAL LOW (ref 13.0–17.0)
MCH: 30.3 pg (ref 26.0–34.0)
MCHC: 32.9 g/dL (ref 30.0–36.0)
MCV: 92 fL (ref 80.0–100.0)
Platelets: 124 10*3/uL — ABNORMAL LOW (ref 150–400)
RBC: 4.26 MIL/uL (ref 4.22–5.81)
RDW: 13.2 % (ref 11.5–15.5)
WBC: 8 10*3/uL (ref 4.0–10.5)
nRBC: 0 % (ref 0.0–0.2)

## 2020-11-17 LAB — PROTIME-INR
INR: 1.6 — ABNORMAL HIGH (ref 0.8–1.2)
Prothrombin Time: 18.6 seconds — ABNORMAL HIGH (ref 11.4–15.2)

## 2020-11-17 MED ORDER — WARFARIN SODIUM 5 MG PO TABS
10.0000 mg | ORAL_TABLET | Freq: Once | ORAL | Status: AC
  Administered 2020-11-17: 10 mg via ORAL
  Filled 2020-11-17: qty 2

## 2020-11-17 MED ORDER — IBUPROFEN 200 MG PO TABS
600.0000 mg | ORAL_TABLET | Freq: Four times a day (QID) | ORAL | Status: DC | PRN
  Administered 2020-11-17: 600 mg via ORAL
  Filled 2020-11-17: qty 3

## 2020-11-17 MED ORDER — CIPROFLOXACIN HCL 500 MG PO TABS: 500.0000 mg | ORAL_TABLET | Freq: Two times a day (BID) | ORAL | 0 refills | Status: AC

## 2020-11-17 MED ORDER — HYDROCODONE-ACETAMINOPHEN 5-325 MG PO TABS: 1.0000 | ORAL_TABLET | Freq: Four times a day (QID) | ORAL | 0 refills | Status: DC | PRN

## 2020-11-17 NOTE — Discharge Instructions (Signed)
Troy [name],  You were in the hospital with a urinary tract infection. Please continue the antibiotics. Please have your INR checked on Monday.

## 2020-11-17 NOTE — Progress Notes (Signed)
ANTICOAGULATION CONSULT NOTE - Follow Up Consult  Pharmacy Consult for warfarin Indication: atrial fibrillation  Allergies  Allergen Reactions  . Gluten Meal Other (See Comments)    REACTION: Celiac disease (severe stomach pain) REACTION: Celiac disease (severe stomach pain)   . Iodinated Diagnostic Agents Other (See Comments) and Anaphylaxis    V-Fib arrest during cardiac cath suspected d/t contrast dye in 2016 Cardiac arrest Cardiac arrest V-Fib arrest during cardiac cath suspected d/t contrast dye in 2016 Cardiac arrest  . Metrizamide Other (See Comments)    V-Fib arrest during cardiac cath suspected d/t contrast dye in 2016  . Tizanidine Anxiety    Depression  . Whey Other (See Comments)    REACTION: Celiac disease (severe stomach pain)  . Morphine And Related Other (See Comments)    REACTION: "REALLY BAD STOMACH PAIN"  . Apple Diarrhea  . Tizanidine Hcl Anxiety    Depression  . Wheat Bran Other (See Comments)    Vital Signs: Temp: 99.4 F (37.4 C) (01/08 0555) Temp Source: Oral (01/08 0555) BP: 126/87 (01/08 0555) Pulse Rate: 88 (01/08 0555)  Labs: Recent Labs    11/14/20 2244 11/15/20 1049 11/16/20 0457 11/17/20 0505  HGB 16.4  --  12.9* 12.9*  HCT 49.2  --  39.2 39.2  PLT 141*  --  109* 124*  LABPROT  --  19.3* 18.9* 18.6*  INR  --  1.7* 1.7* 1.6*  CREATININE 1.30*  --  0.91  --     Estimated Creatinine Clearance: 95.9 mL/min (by C-G formula based on SCr of 0.91 mg/dL).  Medications:  Warfarin 7.5 mg daily except 10 mg on Saturday  Assessment: 70 y/o M with a h/o atrial fibrillation on warfarin PTA admitted with stomach pain. Patient is being treated for sepsis secondary to UTI and hyperbilirubinemia. Pharmacy consulted to continue warfarin dosing. Last dose of warfarin 1/4 per med history.   11/17/20 8:33 AM   INR sub-therapeutic at 1.6, decreased from yesterday  CBC: Hgb 12.9, low but stable; Plts low 124k - no bleeding documented  Patient  is on ceftriaxone which may potentiate warfarin  Heart healthy diet ordered, no intake recorded  Goal of Therapy:  INR 2-3   Plan:  Increase Warfarin 10 mg once today F/U INR daily  Peggyann Juba, PharmD, BCPS Pharmacy: [phone] 11/17/2020,8:33 AM

## 2020-11-17 NOTE — Discharge Summary (Signed)
Physician Discharge Summary  Troy [name] NWG:[id number] DOB: [birth year] DOA: 11/15/2020  PCP: Velna Hatchet, MD  Admit date: 11/15/2020 Discharge date: 11/17/2020  Admitted From: Home Disposition: Home  Recommendations for Outpatient Follow-up:  1. Follow up with PCP in 1 week 2. Please follow up on the following pending results: None  Home Health: None Equipment/Devices: None  Discharge Condition: Stable CODE STATUS: DNR Diet recommendation: Heart healthy   Brief/Interim Summary:  Admission HPI written by Eben Burow, MD   Chief Complaint: stomach pain  HPI: Troy [name] is a 70 y.o. male with medical history significant of afib, CAD, HTN, HLD. Presenting with RLQ/LLQ abdominal pain for 4 days. It's a constant soreness. He is not aware of anything that makes it worse. APAP helped a little for a day or two; but then it was not effective. 2 days ago he woke up with chills and mild fever. His symptoms worsened through yesterday; so, he decided to come to the ED.    Hospital course:  Sepsis Acute cystitis Present on admission. Patient with dysuria. Secondary to UTI as possible source. Urine culture obtained and pending. Started on empiric Ceftriaxone. Urine culture significant for Citrobacter Koseri and patient transitioned to Ciprofloxacin to complete a 7 day total course on discharge.  Positive blood culture Likely contaminant. Discussed with ID. No treatment.  AKI Baseline creatinine of 0.8-0.9. Creatinine of 1.3 on admission. Resolved with IV fluids.  Thrombocytopenia Unsure if acute or chronic but there is a drop from 141 on admission to 109 today. In setting of infection/sepsis. Improving on discharge.  Primary hypertension Continue metoprolol, losartan and spironolactone.  Permanent atrial fibrillation Continue Coumadin and metoprolol   Discharge Diagnoses:  Active Problems:   Sepsis ([place])   AKI (acute kidney injury) ([place])    Thrombocytopenia ([place])    Discharge Instructions   Allergies as of 11/17/2020      Reactions   Gluten Meal Other (See Comments)   REACTION: Celiac disease (severe stomach pain) REACTION: Celiac disease (severe stomach pain)   Iodinated Diagnostic Agents Other (See Comments), Anaphylaxis   V-Fib arrest during cardiac cath suspected d/t contrast dye in 2016 Cardiac arrest Cardiac arrest V-Fib arrest during cardiac cath suspected d/t contrast dye in 2016 Cardiac arrest   Metrizamide Other (See Comments)   V-Fib arrest during cardiac cath suspected d/t contrast dye in 2016   Tizanidine Anxiety   Depression   [place] Other (See Comments)   REACTION: Celiac disease (severe stomach pain)   Morphine And Related Other (See Comments)   REACTION: "REALLY BAD STOMACH PAIN"   Apple Diarrhea   Tizanidine Hcl Anxiety   Depression   Wheat Bran Other (See Comments)      Medication List    STOP taking these medications   amoxicillin 500 MG capsule Commonly known as: AMOXIL   atorvastatin 80 MG tablet Commonly known as: LIPITOR   ezetimibe 10 MG tablet Commonly known as: ZETIA   [place] 4 % Crea Generic drug: Fluorouracil     TAKE these medications   acetaminophen 325 MG tablet Commonly known as: TYLENOL Take 650 mg by mouth every 6 (six) hours as needed for mild pain, fever or headache.   ALPRAZolam 1 MG tablet Commonly known as: XANAX Take 1 mg by mouth daily as needed for anxiety.   ciprofloxacin 500 MG tablet Commonly known as: Cipro Take 1 tablet (500 mg total) by mouth 2 (two) times daily for 4 days. Start taking on: November 18, 2020  HYDROcodone-acetaminophen 5-325 MG tablet Commonly known as: NORCO/VICODIN Take 1-2 tablets by mouth every 6 (six) hours as needed for moderate pain or severe pain.   losartan 100 MG tablet Commonly known as: COZAAR TAKE 1 TABLET(100 MG) BY MOUTH DAILY What changed: See the new instructions.   metoprolol tartrate 100 MG  tablet Commonly known as: LOPRESSOR TAKE 1 TABLET(100 MG) BY MOUTH TWICE DAILY What changed:   how much to take  how to take this  when to take this  additional instructions   oxymetazoline 0.05 % nasal spray Commonly known as: AFRIN Place 1 spray into both nostrils 2 (two) times daily as needed (FOR SINUS CONGESTION.).   spironolactone 25 MG tablet Commonly known as: ALDACTONE TAKE 1 TABLET(25 MG) BY MOUTH DAILY What changed: See the new instructions.   TURMERIC PO Take 1 tablet by mouth 2 (two) times daily.   warfarin 5 MG tablet Commonly known as: COUMADIN Take as directed. If you are unsure how to take this medication, talk to your nurse or doctor. Original instructions: TAKE AS DIRECTED BY ANTICOAGULATION CLINIC What changed:   how much to take  how to take this  when to take this  additional instructions       Allergies  Allergen Reactions  . Gluten Meal Other (See Comments)    REACTION: Celiac disease (severe stomach pain) REACTION: Celiac disease (severe stomach pain)   . Iodinated Diagnostic Agents Other (See Comments) and Anaphylaxis    V-Fib arrest during cardiac cath suspected d/t contrast dye in 2016 Cardiac arrest Cardiac arrest V-Fib arrest during cardiac cath suspected d/t contrast dye in 2016 Cardiac arrest  . Metrizamide Other (See Comments)    V-Fib arrest during cardiac cath suspected d/t contrast dye in 2016  . Tizanidine Anxiety    Depression  . Whey Other (See Comments)    REACTION: Celiac disease (severe stomach pain)  . Morphine And Related Other (See Comments)    REACTION: "REALLY BAD STOMACH PAIN"  . Apple Diarrhea  . Tizanidine Hcl Anxiety    Depression  . Wheat Bran Other (See Comments)    Consultations:  None   Procedures/Studies: CT ABDOMEN PELVIS WO CONTRAST  Result Date: 11/15/2020 CLINICAL DATA:  Abdominal pain, fever, leukocytosis, lactic acidosis EXAM: CT ABDOMEN AND PELVIS WITHOUT CONTRAST TECHNIQUE:  Multidetector CT imaging of the abdomen and pelvis was performed following the standard protocol without IV contrast. COMPARISON:  07/29/2019 FINDINGS: Lower chest: The visualized lung bases are clear bilaterally. Extensive coronary artery calcification. Global cardiac size within normal limits. Hepatobiliary: No focal liver abnormality is seen. No gallstones, gallbladder wall thickening, or biliary dilatation. Pancreas: Unremarkable Spleen: Unremarkable Adrenals/Urinary Tract: The adrenal glands are unremarkable. The kidneys are normal in size and position. Probable vascular calcification within the lower pole of the left kidney. The kidneys are otherwise unremarkable. There is moderate perivesicular inflammatory stranding present, new since prior examination and suggestive of an underlying diffuse infectious or inflammatory cystitis. The bladder is not distended Stomach/Bowel: Mild scattered distal colonic diverticulosis. The stomach, small bowel, and large bowel are otherwise unremarkable. Appendix absent. No free intraperitoneal gas or fluid. Vascular/Lymphatic: Mild aortoiliac atherosclerotic calcification. No aortic aneurysm. Reproductive: Prostate is unremarkable. Other: Rectum unremarkable.  No abdominal wall hernia identified. Musculoskeletal: No lytic or blastic bone lesion. No acute bone abnormality. Advanced degenerative changes are seen at L2-3 with grade 1 anterolisthesis at this level this is unchanged from prior examination. Bilateral L2 pars defects are noted. IMPRESSION: Diffuse perivesicular inflammatory stranding,  progressive since prior examination and suggestive of an underlying diffuse infectious or inflammatory cystitis. Correlation with urinalysis and urine culture may be helpful. No upper urinary tract inflammatory change identified. Extensive coronary artery calcification. Stable changes of L2 spondylolytic anterolisthesis with superimposed advanced degenerative disc disease at this level.  Aortic Atherosclerosis (ICD10-I70.0). Electronically Signed   By: Fidela Salisbury MD   On: 11/15/2020 01:33   DG Chest 2 View  Result Date: 11/15/2020 CLINICAL DATA:  Tachycardia, myalgia EXAM: CHEST - 2 VIEW COMPARISON:  02/11/2017 FINDINGS: Mild bibasilar atelectasis. No pneumothorax or pleural effusion. Cardiac size within normal limits. Pulmonary vascularity is normal. Osseous structures are age-appropriate. IMPRESSION: Bibasilar atelectasis.  In Electronically Signed   By: Fidela Salisbury MD   On: 11/15/2020 01:24   US Abdomen Complete  Result Date: 11/15/2020 CLINICAL DATA:  Acute kidney injury, elevated bilirubin levels EXAM: ABDOMEN ULTRASOUND COMPLETE COMPARISON:  CT 11/15/2020 FINDINGS: Gallbladder: No gallstones or wall thickening visualized. No sonographic Murphy sign noted by sonographer. Common bile duct: Diameter: 5 mm. Liver: No focal lesion identified. Mildly increased hepatic parenchymal echogenicity. Portal vein is patent on color Doppler imaging with normal direction of blood flow towards the liver. IVC: No abnormality visualized. Pancreas: Visualized portion unremarkable. Spleen: Size and appearance within normal limits. Right Kidney: Length: 10.6 cm. Echogenicity within normal limits. No mass or hydronephrosis visualized. Left Kidney: Length: 11.7 cm. Echogenicity within normal limits. No mass or hydronephrosis visualized. Abdominal aorta: No aneurysm visualized. Other findings: None. IMPRESSION: 1. The echogenicity of the liver is mildly increased. This is a nonspecific finding but is most commonly seen with fatty infiltration of the liver. There are no obvious focal liver lesions. 2. Remainder of the examination is within normal limits. Negative for obstructive uropathy. Electronically Signed   By: Davina Poke D.O.   On: 11/15/2020 10:19       Subjective: Some upper abdominal pain that improves with Xanax.  Discharge Exam: Vitals:   11/17/20 0555 11/17/20 1440  BP: 126/87    Pulse: 88 95  Resp: 18 20  Temp: 99.4 F (37.4 C) 98.3 F (36.8 C)  SpO2: 96% 96%   Vitals:   11/16/20 1401 11/16/20 2035 11/17/20 0555 11/17/20 1440  BP: 133/88 (!) 152/98 126/87   Pulse: 88 72 88 95  Resp: 14 18 18 20   Temp: 98.7 F (37.1 C) 99.4 F (37.4 C) 99.4 F (37.4 C) 98.3 F (36.8 C)  TempSrc: Oral Oral Oral Oral  SpO2: 96% 97% 96% 96%  Weight:      Height:        General: Pt is alert, awake, not in acute distress Cardiovascular: RRR, S1/S2 +, no rubs, no gallops Respiratory: CTA bilaterally, no wheezing, no rhonchi Abdominal: Soft, NT, ND, bowel sounds + Extremities: no edema, no cyanosis    The results of significant diagnostics from this hospitalization (including imaging, microbiology, ancillary and laboratory) are listed below for reference.     Microbiology: Recent Results (from the past 240 hour(s))  Resp Panel by RT-PCR (Flu A&B, Covid) Nasopharyngeal Swab     Status: None   Collection Time: 11/14/20  8:18 PM   Specimen: Nasopharyngeal Swab; Nasopharyngeal(NP) swabs in vial transport medium  Result Value Ref Range Status   SARS Coronavirus 2 by RT PCR NEGATIVE NEGATIVE Final    Comment: (NOTE) SARS-CoV-2 target nucleic acids are NOT DETECTED.  The SARS-CoV-2 RNA is generally detectable in upper respiratory specimens during the acute phase of infection. The lowest concentration of  SARS-CoV-2 viral copies this assay can detect is 138 copies/mL. A negative result does not preclude SARS-Cov-2 infection and should not be used as the sole basis for treatment or other patient management decisions. A negative result may occur with  improper specimen collection/handling, submission of specimen other than nasopharyngeal swab, presence of viral mutation(s) within the areas targeted by this assay, and inadequate number of viral copies(<138 copies/mL). A negative result must be combined with clinical observations, patient history, and  epidemiological information. The expected result is Negative.  Fact Sheet for Patients:  EntrepreneurPulse.com.au  Fact Sheet for Healthcare Providers:  IncredibleEmployment.be  This test is no t yet approved or cleared by the [place] FDA and  has been authorized for detection and/or diagnosis of SARS-CoV-2 by FDA under an Emergency Use Authorization (EUA). This EUA will remain  in effect (meaning this test can be used) for the duration of the COVID-19 declaration under Section 564(b)(1) of the Act, 21 U.S.C.section 360bbb-3(b)(1), unless the authorization is terminated  or revoked sooner.       Influenza A by PCR NEGATIVE NEGATIVE Final   Influenza B by PCR NEGATIVE NEGATIVE Final    Comment: (NOTE) The Xpert Xpress SARS-CoV-2/FLU/RSV plus assay is intended as an aid in the diagnosis of influenza from Nasopharyngeal swab specimens and should not be used as a sole basis for treatment. Nasal washings and aspirates are unacceptable for Xpert Xpress SARS-CoV-2/FLU/RSV testing.  Fact Sheet for Patients: EntrepreneurPulse.com.au  Fact Sheet for Healthcare Providers: IncredibleEmployment.be  This test is not yet approved or cleared by the [place] FDA and has been authorized for detection and/or diagnosis of SARS-CoV-2 by FDA under an Emergency Use Authorization (EUA). This EUA will remain in effect (meaning this test can be used) for the duration of the COVID-19 declaration under Section 564(b)(1) of the Act, 21 U.S.C. section 360bbb-3(b)(1), unless the authorization is terminated or revoked.  Performed at [facility], [place] [address] [place], [place] [address]   Urine culture     Status: Abnormal   Collection Time: 11/15/20  2:00 AM   Specimen: Urine, Clean Catch  Result Value Ref Range Status   Specimen Description   Final    URINE, CLEAN CATCH Performed at [facility], [place] [address], [place], [place] [address]    Special Requests   Final    NONE Performed at [facility], [place] [address], [place], [place] [address]    Culture >=100,000 COLONIES/mL CITROBACTER KOSERI (A)  Final   Report Status 11/17/2020 FINAL  Final   Organism ID, Bacteria CITROBACTER KOSERI (A)  Final      Susceptibility   Citrobacter koseri - MIC*    CEFAZOLIN <=4 SENSITIVE Sensitive     CEFEPIME <=0.12 SENSITIVE Sensitive     CEFTRIAXONE <=0.25 SENSITIVE Sensitive     CIPROFLOXACIN <=0.25 SENSITIVE Sensitive     GENTAMICIN <=1 SENSITIVE Sensitive     IMIPENEM <=0.25 SENSITIVE Sensitive     NITROFURANTOIN 32 SENSITIVE Sensitive     TRIMETH/SULFA <=20 SENSITIVE Sensitive     PIP/TAZO <=4 SENSITIVE Sensitive     * >=100,000 COLONIES/mL CITROBACTER KOSERI  Blood culture (routine x 2)     Status: Abnormal (Preliminary result)   Collection Time: 11/15/20  3:31 AM   Specimen: BLOOD  Result Value Ref Range Status   Specimen Description   Final    BLOOD LEFT ANTECUBITAL Performed at [facility] [place], [place]  [address]    Special Requests   Final    BOTTLES DRAWN AEROBIC AND ANAEROBIC Blood Culture adequate volume Performed at [facility] [address] [place], [place] [address]    Culture  Setup Time   Final    AEROBIC BOTTLE ONLY GRAM POSITIVE COCCI Organism ID to follow CRITICAL RESULT CALLED TO, READ BACK BY AND VERIFIED WITH: L POINDEXTER PHARMD 11/16/20 0511 JDW Performed at [facility], [address], [place], [place] [address]    Culture STAPHYLOCOCCUS HOMINIS (A)  Final   Report Status PENDING  Incomplete  Blood culture (routine x 2)     Status: None (Preliminary result)   Collection Time: 11/15/20  3:31 AM   Specimen: BLOOD RIGHT HAND  Result Value Ref Range Status   Specimen Description   Final    BLOOD RIGHT HAND Performed at [facility], [place] [address] [place], [place] [address]    Special Requests   Final    BOTTLES DRAWN AEROBIC AND ANAEROBIC Blood Culture adequate volume Performed at [facility] [address] [place], [place] [address]    Culture   Final    NO GROWTH 2 DAYS Performed at [facility] [address] [place], [place] [address]    Report Status PENDING  Incomplete  Blood Culture ID Panel (Reflexed)     Status: Abnormal   Collection Time: 11/15/20  3:31 AM  Result Value Ref Range Status   Enterococcus faecalis NOT DETECTED NOT DETECTED Final   Enterococcus Faecium NOT DETECTED NOT DETECTED Final   Listeria monocytogenes NOT DETECTED NOT DETECTED Final   Staphylococcus species DETECTED (A) NOT DETECTED Final    Comment: CRITICAL RESULT CALLED TO, READ BACK BY AND VERIFIED WITH: L  POINDEXTER PHARMD 11/16/20 0511 JDW    Staphylococcus aureus (BCID) NOT DETECTED NOT DETECTED Final   Staphylococcus epidermidis NOT DETECTED NOT DETECTED Final   Staphylococcus lugdunensis NOT DETECTED NOT DETECTED Final   Streptococcus species NOT DETECTED NOT DETECTED Final   Streptococcus agalactiae NOT DETECTED NOT DETECTED Final   Streptococcus pneumoniae NOT DETECTED NOT DETECTED Final   Streptococcus pyogenes NOT DETECTED NOT DETECTED Final   A.calcoaceticus-baumannii NOT DETECTED NOT DETECTED Final   Bacteroides fragilis NOT DETECTED NOT DETECTED Final   Enterobacterales NOT DETECTED NOT DETECTED Final   Enterobacter cloacae complex NOT DETECTED NOT DETECTED Final   Escherichia coli NOT DETECTED NOT DETECTED Final   Klebsiella aerogenes NOT DETECTED NOT DETECTED Final   Klebsiella oxytoca NOT DETECTED NOT DETECTED Final   Klebsiella pneumoniae NOT DETECTED NOT DETECTED Final   Proteus species NOT DETECTED NOT DETECTED Final   Salmonella species NOT DETECTED NOT DETECTED Final   Serratia marcescens NOT DETECTED NOT DETECTED Final   Haemophilus influenzae NOT DETECTED NOT DETECTED Final    Neisseria meningitidis NOT DETECTED NOT DETECTED Final   Pseudomonas aeruginosa NOT DETECTED NOT DETECTED Final   Stenotrophomonas maltophilia NOT DETECTED NOT DETECTED Final   Candida albicans NOT DETECTED NOT DETECTED Final   Candida auris NOT DETECTED NOT DETECTED Final   Candida glabrata NOT DETECTED NOT DETECTED Final   Candida krusei NOT DETECTED NOT DETECTED Final   Candida parapsilosis NOT DETECTED NOT DETECTED Final   Candida tropicalis NOT DETECTED NOT DETECTED Final   Cryptococcus neoformans/gattii NOT DETECTED NOT DETECTED Final    Comment: Performed at [facility], [address] [place], [place] [address]     Labs: BNP (  last 3 results) No results for input(s): BNP in the last 8760 hours. Basic Metabolic Panel: Recent Labs  Lab 11/14/20 2244 11/16/20 0457  NA 137 138  K 3.9 3.9  CL 101 107  CO2 23 23  GLUCOSE 141* 106*  BUN 18 15  CREATININE 1.30* 0.91  CALCIUM 9.5 8.3*   Liver Function Tests: Recent Labs  Lab 11/14/20 2244 11/15/20 1049 11/16/20 0457  AST 28  --  18  ALT 26  --  18  ALKPHOS 38  --  32*  BILITOT 2.6* 1.8* 1.0  PROT 8.1  --  6.2*  ALBUMIN 4.5  --  3.4*   Recent Labs  Lab 11/14/20 2244  LIPASE 27   No results for input(s): AMMONIA in the last 168 hours. CBC: Recent Labs  Lab 11/14/20 2244 11/16/20 0457 11/17/20 0505  WBC 18.1* 10.6* 8.0  NEUTROABS 14.6*  --   --   HGB 16.4 12.9* 12.9*  HCT 49.2 39.2 39.2  MCV 90.9 93.3 92.0  PLT 141* 109* 124*   Cardiac Enzymes: No results for input(s): CKTOTAL, CKMB, CKMBINDEX, TROPONINI in the last 168 hours. BNP: Invalid input(s): POCBNP CBG: No results for input(s): GLUCAP in the last 168 hours. D-Dimer No results for input(s): DDIMER in the last 72 hours. Hgb A1c No results for input(s): HGBA1C in the last 72 hours. Lipid Profile No results for input(s): CHOL, HDL, LDLCALC, TRIG, CHOLHDL, LDLDIRECT in the last 72 hours. Thyroid function studies No results for input(s):  TSH, T4TOTAL, T3FREE, THYROIDAB in the last 72 hours.  Invalid input(s): FREET3 Anemia work up No results for input(s): VITAMINB12, FOLATE, FERRITIN, TIBC, IRON, RETICCTPCT in the last 72 hours. Urinalysis    Component Value Date/Time   COLORURINE AMBER (A) 11/15/2020 0300   APPEARANCEUR CLOUDY (A) 11/15/2020 0300   LABSPEC 1.033 (H) 11/15/2020 0300   PHURINE 5.0 11/15/2020 0300   GLUCOSEU NEGATIVE 11/15/2020 0300   HGBUR SMALL (A) 11/15/2020 0300   BILIRUBINUR NEGATIVE 11/15/2020 0300   KETONESUR 5 (A) 11/15/2020 0300   PROTEINUR 100 (A) 11/15/2020 0300   NITRITE NEGATIVE 11/15/2020 0300   LEUKOCYTESUR LARGE (A) 11/15/2020 0300   Sepsis Labs Invalid input(s): PROCALCITONIN,  WBC,  LACTICIDVEN Microbiology Recent Results (from the past 240 hour(s))  Resp Panel by RT-PCR (Flu A&B, Covid) Nasopharyngeal Swab     Status: None   Collection Time: 11/14/20  8:18 PM   Specimen: Nasopharyngeal Swab; Nasopharyngeal(NP) swabs in vial transport medium  Result Value Ref Range Status   SARS Coronavirus 2 by RT PCR NEGATIVE NEGATIVE Final    Comment: (NOTE) SARS-CoV-2 target nucleic acids are NOT DETECTED.  The SARS-CoV-2 RNA is generally detectable in upper respiratory specimens during the acute phase of infection. The lowest concentration of SARS-CoV-2 viral copies this assay can detect is 138 copies/mL. A negative result does not preclude SARS-Cov-2 infection and should not be used as the sole basis for treatment or other patient management decisions. A negative result may occur with  improper specimen collection/handling, submission of specimen other than nasopharyngeal swab, presence of viral mutation(s) within the areas targeted by this assay, and inadequate number of viral copies(<138 copies/mL). A negative result must be combined with clinical observations, patient history, and epidemiological information. The expected result is Negative.  Fact Sheet for Patients:   EntrepreneurPulse.com.au  Fact Sheet for Healthcare Providers:  IncredibleEmployment.be  This test is no t yet approved or cleared by the [place] FDA and  has been authorized for detection  and/or diagnosis of SARS-CoV-2 by FDA under an Emergency Use Authorization (EUA). This EUA will remain  in effect (meaning this test can be used) for the duration of the COVID-19 declaration under Section 564(b)(1) of the Act, 21 U.S.C.section 360bbb-3(b)(1), unless the authorization is terminated  or revoked sooner.       Influenza A by PCR NEGATIVE NEGATIVE Final   Influenza B by PCR NEGATIVE NEGATIVE Final    Comment: (NOTE) The Xpert Xpress SARS-CoV-2/FLU/RSV plus assay is intended as an aid in the diagnosis of influenza from Nasopharyngeal swab specimens and should not be used as a sole basis for treatment. Nasal washings and aspirates are unacceptable for Xpert Xpress SARS-CoV-2/FLU/RSV testing.  Fact Sheet for Patients: BloggerCourse.com  Fact Sheet for Healthcare Providers: SeriousBroker.it  This test is not yet approved or cleared by the [place] FDA and has been authorized for detection and/or diagnosis of SARS-CoV-2 by FDA under an Emergency Use Authorization (EUA). This EUA will remain in effect (meaning this test can be used) for the duration of the COVID-19 declaration under Section 564(b)(1) of the Act, 21 U.S.C. section 360bbb-3(b)(1), unless the authorization is terminated or revoked.  Performed at [facility], [address] [place], [place] [address]   Urine culture     Status: Abnormal   Collection Time: 11/15/20  2:00 AM   Specimen: Urine, Clean Catch  Result Value Ref Range Status   Specimen Description   Final    URINE, CLEAN CATCH Performed at [facility], [address] [place], [place] [address]    Special Requests    Final    NONE Performed at [facility], [address] [place], [place] [address]    Culture >=100,000 COLONIES/mL CITROBACTER KOSERI (A)  Final   Report Status 11/17/2020 FINAL  Final   Organism ID, Bacteria CITROBACTER KOSERI (A)  Final      Susceptibility   Citrobacter koseri - MIC*    CEFAZOLIN <=4 SENSITIVE Sensitive     CEFEPIME <=0.12 SENSITIVE Sensitive     CEFTRIAXONE <=0.25 SENSITIVE Sensitive     CIPROFLOXACIN <=0.25 SENSITIVE Sensitive     GENTAMICIN <=1 SENSITIVE Sensitive     IMIPENEM <=0.25 SENSITIVE Sensitive     NITROFURANTOIN 32 SENSITIVE Sensitive     TRIMETH/SULFA <=20 SENSITIVE Sensitive     PIP/TAZO <=4 SENSITIVE Sensitive     * >=100,000 COLONIES/mL CITROBACTER KOSERI  Blood culture (routine x 2)     Status: Abnormal (Preliminary result)   Collection Time: 11/15/20  3:31 AM   Specimen: BLOOD  Result Value Ref Range Status   Specimen Description   Final    BLOOD LEFT ANTECUBITAL Performed at [facility], [address] [place], [place] [address]    Special Requests   Final    BOTTLES DRAWN AEROBIC AND ANAEROBIC Blood Culture adequate volume Performed at [facility], [address] [place], [place] [id number]    Culture  Setup Time   Final    AEROBIC BOTTLE ONLY GRAM POSITIVE COCCI Organism ID to follow CRITICAL RESULT CALLED TO, READ BACK BY AND VERIFIED WITH: L POINDEXTER PHARMD 11/16/20 0511 JDW Performed at [facility], [address] [place], [place] [address]    Culture STAPHYLOCOCCUS HOMINIS (A)  Final   Report Status PENDING  Incomplete  Blood culture (routine x 2)     Status: None (Preliminary result)   Collection Time: 11/15/20  3:31 AM  Specimen: BLOOD RIGHT HAND  Result Value Ref Range Status   Specimen Description   Final    BLOOD RIGHT HAND Performed at [facility], [place] [address], [place], [place] [address]    Special Requests   Final    BOTTLES DRAWN AEROBIC  AND ANAEROBIC Blood Culture adequate volume Performed at [facility] [address], [place], [place] [address]    Culture   Final    NO GROWTH 2 DAYS Performed at [facility] [address], [place], [place] [address]    Report Status PENDING  Incomplete  Blood Culture ID Panel (Reflexed)     Status: Abnormal   Collection Time: 11/15/20  3:31 AM  Result Value Ref Range Status   Enterococcus faecalis NOT DETECTED NOT DETECTED Final   Enterococcus Faecium NOT DETECTED NOT DETECTED Final   Listeria monocytogenes NOT DETECTED NOT DETECTED Final   Staphylococcus species DETECTED (A) NOT DETECTED Final    Comment: CRITICAL RESULT CALLED TO, READ BACK BY AND VERIFIED WITH: L  POINDEXTER PHARMD 11/16/20 0511 JDW    Staphylococcus aureus (BCID) NOT DETECTED NOT DETECTED Final   Staphylococcus epidermidis NOT DETECTED NOT DETECTED Final   Staphylococcus lugdunensis NOT DETECTED NOT DETECTED Final   Streptococcus species NOT DETECTED NOT DETECTED Final   Streptococcus agalactiae NOT DETECTED NOT DETECTED Final   Streptococcus pneumoniae NOT DETECTED NOT DETECTED Final   Streptococcus pyogenes NOT DETECTED NOT DETECTED Final   A.calcoaceticus-baumannii NOT DETECTED NOT DETECTED Final   Bacteroides fragilis NOT DETECTED NOT DETECTED Final   Enterobacterales NOT DETECTED NOT DETECTED Final   Enterobacter cloacae complex NOT DETECTED NOT DETECTED Final   Escherichia coli NOT DETECTED NOT DETECTED Final   Klebsiella aerogenes NOT DETECTED NOT DETECTED Final   Klebsiella oxytoca NOT DETECTED NOT DETECTED Final   Klebsiella pneumoniae NOT DETECTED NOT DETECTED Final   Proteus species NOT DETECTED NOT DETECTED Final   Salmonella species NOT DETECTED NOT DETECTED Final   Serratia marcescens NOT DETECTED NOT DETECTED Final   Haemophilus influenzae NOT DETECTED NOT DETECTED Final   Neisseria meningitidis NOT DETECTED NOT DETECTED Final   Pseudomonas aeruginosa NOT DETECTED  NOT DETECTED Final   Stenotrophomonas maltophilia NOT DETECTED NOT DETECTED Final   Candida albicans NOT DETECTED NOT DETECTED Final   Candida auris NOT DETECTED NOT DETECTED Final   Candida glabrata NOT DETECTED NOT DETECTED Final   Candida krusei NOT DETECTED NOT DETECTED Final   Candida parapsilosis NOT DETECTED NOT DETECTED Final   Candida tropicalis NOT DETECTED NOT DETECTED Final   Cryptococcus neoformans/gattii NOT DETECTED NOT DETECTED Final    Comment: Performed at [facility], [address] [place], [place] [address]     Time coordinating discharge: 35 minutes  SIGNED:   Cordelia Poche, MD [facility] 11/17/2020, 3:27 PM

## 2020-11-18 LAB — CULTURE, BLOOD (ROUTINE X 2): Special Requests: ADEQUATE

## 2020-11-20 DIAGNOSIS — D696 Thrombocytopenia, unspecified: Secondary | ICD-10-CM | POA: Diagnosis not present

## 2020-11-20 DIAGNOSIS — I10 Essential (primary) hypertension: Secondary | ICD-10-CM | POA: Diagnosis not present

## 2020-11-20 DIAGNOSIS — A419 Sepsis, unspecified organism: Secondary | ICD-10-CM | POA: Diagnosis not present

## 2020-11-20 DIAGNOSIS — R1084 Generalized abdominal pain: Secondary | ICD-10-CM | POA: Diagnosis not present

## 2020-11-20 DIAGNOSIS — N3001 Acute cystitis with hematuria: Secondary | ICD-10-CM | POA: Diagnosis not present

## 2020-11-20 DIAGNOSIS — K5903 Drug induced constipation: Secondary | ICD-10-CM | POA: Diagnosis not present

## 2020-11-20 DIAGNOSIS — K047 Periapical abscess without sinus: Secondary | ICD-10-CM | POA: Diagnosis not present

## 2020-11-20 DIAGNOSIS — T402X5A Adverse effect of other opioids, initial encounter: Secondary | ICD-10-CM | POA: Diagnosis not present

## 2020-11-20 DIAGNOSIS — I4891 Unspecified atrial fibrillation: Secondary | ICD-10-CM | POA: Diagnosis not present

## 2020-11-20 DIAGNOSIS — R7881 Bacteremia: Secondary | ICD-10-CM | POA: Diagnosis not present

## 2020-11-20 DIAGNOSIS — N179 Acute kidney failure, unspecified: Secondary | ICD-10-CM | POA: Diagnosis not present

## 2020-11-20 DIAGNOSIS — Z7901 Long term (current) use of anticoagulants: Secondary | ICD-10-CM | POA: Diagnosis not present

## 2020-11-20 LAB — CULTURE, BLOOD (ROUTINE X 2)
Culture: NO GROWTH
Special Requests: ADEQUATE

## 2020-12-03 ENCOUNTER — Institutional Professional Consult (permissible substitution): Payer: Medicare Other | Admitting: Neurology

## 2020-12-03 DIAGNOSIS — I4891 Unspecified atrial fibrillation: Secondary | ICD-10-CM | POA: Diagnosis not present

## 2020-12-03 DIAGNOSIS — Z7901 Long term (current) use of anticoagulants: Secondary | ICD-10-CM | POA: Diagnosis not present

## 2020-12-25 ENCOUNTER — Ambulatory Visit (INDEPENDENT_AMBULATORY_CARE_PROVIDER_SITE_OTHER): Payer: Medicare Other | Admitting: Neurology

## 2020-12-25 ENCOUNTER — Other Ambulatory Visit: Payer: Self-pay

## 2020-12-25 ENCOUNTER — Encounter: Payer: Self-pay | Admitting: Neurology

## 2020-12-25 ENCOUNTER — Telehealth: Payer: Self-pay

## 2020-12-25 VITALS — BP 168/98 | HR 68 | Ht 71.0 in | Wt 247.0 lb

## 2020-12-25 DIAGNOSIS — E669 Obesity, unspecified: Secondary | ICD-10-CM | POA: Diagnosis not present

## 2020-12-25 DIAGNOSIS — G4733 Obstructive sleep apnea (adult) (pediatric): Secondary | ICD-10-CM | POA: Diagnosis not present

## 2020-12-25 DIAGNOSIS — I482 Chronic atrial fibrillation, unspecified: Secondary | ICD-10-CM

## 2020-12-25 DIAGNOSIS — R030 Elevated blood-pressure reading, without diagnosis of hypertension: Secondary | ICD-10-CM | POA: Diagnosis not present

## 2020-12-25 DIAGNOSIS — Z9989 Dependence on other enabling machines and devices: Secondary | ICD-10-CM

## 2020-12-25 DIAGNOSIS — R351 Nocturia: Secondary | ICD-10-CM | POA: Diagnosis not present

## 2020-12-25 NOTE — Telephone Encounter (Signed)
LVM for pt to call me back to schedule sleep study  

## 2020-12-25 NOTE — Patient Instructions (Signed)
Thank you for choosing [facility] for your sleep related care! It was nice to meet you today! I appreciate that you entrust me with your sleep related healthcare concerns. I hope, I was able to address at least some of your concerns today, and that I can help you feel reassured and also get better.    Here is what we discussed today and what we came up with as our plan for you:    Based on your symptoms and your exam I believe you are still at risk for obstructive sleep apnea and would benefit from reevaluation as it has been many years and you need new supplies and an updated machine. Therefore, I think we should proceed with a sleep study to determine how severe your sleep apnea is. If you have more than mild OSA, I want you to consider ongoing treatment with CPAP. Please remember, the risks and ramifications of moderate to severe obstructive sleep apnea or OSA are: Cardiovascular disease, including congestive heart failure, stroke, difficult to control hypertension, arrhythmias, and even type 2 diabetes has been linked to untreated OSA. Sleep apnea causes disruption of sleep and sleep deprivation in most cases, which, in turn, can cause recurrent headaches, problems with memory, mood, concentration, focus, and vigilance. Most people with untreated sleep apnea report excessive daytime sleepiness, which can affect their ability to drive. Please do not drive if you feel sleepy.   I will likely see you back after your sleep study to go over the test results and where to go from there. We will call you after your sleep study to advise about the results (most likely, you will hear from Kristen, my nurse) and to set up an appointment at the time, as necessary.    Our sleep lab administrative assistant will call you to schedule your sleep study. If you don't hear back from her by about 2 weeks from now, please feel free to call her at [phone]. You can leave a message with your phone  number and concerns, if you get the voicemail box. She will call back as soon as possible.     

## 2020-12-25 NOTE — Progress Notes (Signed)
Subjective:    Patient ID: Troy [name] is a 70 y.o. male.  HPI     [name], MD, PhD [facility] [address] [place], [place] [address]  Dear Dr. Ardeth Perfect,   I saw your patient, Troy [name], upon your kind request in my sleep clinic today for initial consultation of his sleep disorder, in particular, evaluation of his prior diagnosis of obstructive sleep apnea.  The patient is unaccompanied today.  As you know, Troy [name] is a 70 year old right-handed gentleman with an underlying medical history of coronary artery disease, chronic A. fib, mitral regurgitation, left ventricular hypertrophy, history of V. fib, aortic dilatation of the ascending aorta, arthritis, hyperlipidemia, hypertension, lung nodule, obesity and recent hospitalization in January 2022 for sepsis likely secondary to cystitis with complication of thrombocytopenia, and acute kidney injury, who was previously diagnosed with obstructive sleep apnea and placed on CPAP therapy.  Prior sleep study results are not available for my review today.  Testing was years ago, at [facility], over 10 years ago per patient.  He reports that he was diagnosed with severe sleep apnea at the time.  This is his second machine, he received this CPAP machine about 6 years ago through [facility].  Supplies were too expensive through [facility] and he has been following supplies online.  He is up-to-date with changing the filter on a monthly basis but has not changed his mask in about 4 years.  He uses a Quatro full facemask size large.  He does notice a leak at times.  I was able to review his compliance data from the machine, in the past 29 days he used his machine every night with percent use days greater than 4 hours at 100%, indicating superb compliance, average usage of 8 hours and 54 minutes, residual AHI at goal at 1/h, leak acceptable with a 95th percentile at 20.3 L/min on a pressure of 10 cm with EPR of 2.  He  has benefited from treatment and is fully compliant with it.  He is not aware of any family history of sleep apnea.  He lives with his wife, he is retired from Teaching laboratory technician work.  He has 2 grown stepchildren.  He has no biological children, he has 4 cats and 1 dog in the household.  He is a non-smoker and drinks alcohol infrequently, very rarely by his report and has recently started drinking caffeine in the form of coffee, 1 cup/day on average.  He reports that he is no longer on spironolactone.  He has a prescription for hydrocodone and Xanax.  He rarely takes hydrocodone.  He takes Xanax either half a milligram or 1 mg at bedtime fairly consistently.  Bedtime is generally around 10 PM and rise time between 730 and 8 AM.  He has nocturia about once or twice per average night and denies any recurrent morning headaches. I reviewed your office note from 10/22/20. His Epworth sleepiness score is 1 out of 24, fatigue severity score is 10 out of 63.  His Past Medical History Is Significant For: Past Medical History:  Diagnosis Date  . Arthritis    "knees" (01/02/2017)  . Ascending aorta dilatation (HCC)     8mm by CT 2017 and 71mm by CT 2019, 6mm by echo 07/2020  . CAD (coronary artery disease)    a. s/p PCI in 2010 in [place]. b. Unstable angina/LHC 0/11/69 complicated by V fib arrest after contrast injection. Cath 06/19/2015 s/p DES to mid LAD  and RPDA. c. 11/2016: chest pain/ abnormal nuclear stress test prompting cath 11/13/16 showing 80% ostial diag, 60% mid RCA, no acute lesions, medical therapy recommended.  . Dyslipidemia   . Essential hypertension   . Lung nodule < 6cm on CT 06/16/15   Right lung  . Mitral regurgitation    mild to moderate by echo 07/2020  . OSA on CPAP   . Persistent atrial fibrillation ([facility])    a. 06/2015 noted to be in new a-fib when arrived with unstable angina. Converted to NSR after being shocked in the cath lab for vfib;  b. 06/2015 Eliquis initiated as PAF noted on event  monitor. c. Recurrent atrial fib?flutter 10/2016, issues with recurrent AF requiring multiple DCCVs in 02/2017. Failed sotalol, not candidate for Tikosyn due to cost/QTC, Multaq not felt likely strong enough.  . Pulmonary nodule 07/2015   a. 1.5 x 1.2 cm smoothly marginated nodule in the central aspect of the right lower lobe with recommendation correlation with nonemergent PET-CT to exclude a neoplasm , stable by CT 07/2016  . Severe left ventricular hypertrophy   . Ventricular fibrillation ([facility])    a. occured during cath 06/18/2015.    His Past Surgical History Is Significant For: Past Surgical History:  Procedure Laterality Date  . CARDIAC CATHETERIZATION N/A 06/18/2015   Procedure: Left Heart Cath and Coronary Angiography;  Surgeon: Burnell Blanks, MD;  Location: [facility];  Service: Cardiovascular;  Laterality: N/A;  . CARDIAC CATHETERIZATION N/A 06/19/2015   Procedure: Coronary Stent Intervention;  Surgeon: Peter M Martinique, MD;  Location: [facility];  Service: Cardiovascular;  Laterality: N/A;  . CARDIAC CATHETERIZATION N/A 11/13/2016   Procedure: Left Heart Cath and Coronary Angiography;  Surgeon: Lorretta Harp, MD;  Location: [facility];  Service: Cardiovascular;  Laterality: N/A;  . CARDIOVERSION N/A 11/17/2016   Procedure: CARDIOVERSION;  Surgeon: Larey Dresser, MD;  Location: [facility];  Service: Cardiovascular;  Laterality: N/A;  . CARDIOVERSION N/A 01/03/2017   Procedure: CARDIOVERSION;  Surgeon: Lelon Perla, MD;  Location: [facility];  Service: Cardiovascular;  Laterality: N/A;  . CARDIOVERSION N/A 02/05/2017   Procedure: Cardioversion;  Surgeon: Evans Lance, MD;  Location: [facility];  Service: Cardiovascular;  Laterality: N/A;  . CARDIOVERSION N/A 02/20/2017   Procedure: Cardioversion;  Surgeon: Evans Lance, MD;  Location: [facility];  Service: Cardiovascular;  Laterality: N/A;  . CARTILAGE SURGERY Bilateral    "thumbs"  .  CATARACT EXTRACTION W/ INTRAOCULAR LENS  IMPLANT, BILATERAL Bilateral   . CORONARY ANGIOPLASTY WITH STENT PLACEMENT  ~ 2007  . JOINT REPLACEMENT    . KNEE ARTHROSCOPY Right   . RETINAL DETACHMENT SURGERY Bilateral    "laser thing"  . TEE WITHOUT CARDIOVERSION N/A 11/17/2016   Procedure: TRANSESOPHAGEAL ECHOCARDIOGRAM (TEE);  Surgeon: Larey Dresser, MD;  Location: [facility];  Service: Cardiovascular;  Laterality: N/A;  . TOTAL KNEE ARTHROPLASTY Left 2000s  . TOTAL KNEE ARTHROPLASTY Right 02/08/2018   Procedure: RIGHT TOTAL KNEE ARTHROPLASTY;  Surgeon: Gaynelle Arabian, MD;  Location: [facility];  Service: Orthopedics;  Laterality: Right;  with block    His Family History Is Significant For: Family History  Problem Relation Age of Onset  . Stroke Mother   . Emphysema Mother   . Lung cancer Mother   . Heart attack Father   . Drug abuse Father   . Heart attack Maternal Grandfather   . Heart attack Paternal Grandfather   .  Diabetes Sister     His Social History Is Significant For: Social History   Socioeconomic History  . Marital status: Divorced    Spouse name: Not on file  . Number of children: Not on file  . Years of education: Not on file  . Highest education level: Not on file  Occupational History  . Not on file  Tobacco Use  . Smoking status: Never Smoker  . Smokeless tobacco: Never Used  . Tobacco comment: Second-hand exposure through parents  Vaping Use  . Vaping Use: Never used  Substance and Sexual Activity  . Alcohol use: Yes    Alcohol/week: 2.0 standard drinks    Types: 2 Glasses of wine per week  . Drug use: No  . Sexual activity: Yes  Other Topics Concern  . Not on file  Social History Narrative   Originally he is from [place], [place]. He moved to [facility] in 2013. He has a dog & cat at home. No bird or mold exposure. Has a hot tub but hasn't used it in 4 months. 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    Social Determinants of Health   Financial Resource Strain: Not on  file  Food Insecurity: Not on file  Transportation Needs: Not on file  Physical Activity: Not on file  Stress: Not on file  Social Connections: Not on file    His Allergies Are:  Allergies  Allergen Reactions  . Gluten Meal Other (See Comments)    REACTION: Celiac disease (severe stomach pain) REACTION: Celiac disease (severe stomach pain)   . Iodinated Diagnostic Agents Other (See Comments) and Anaphylaxis    V-Fib arrest during cardiac cath suspected d/t contrast dye in 2016 Cardiac arrest Cardiac arrest V-Fib arrest during cardiac cath suspected d/t contrast dye in 2016 Cardiac arrest  . Metrizamide Other (See Comments)    V-Fib arrest during cardiac cath suspected d/t contrast dye in 2016  . Tizanidine Anxiety    Depression  . Whey Other (See Comments)    REACTION: Celiac disease (severe stomach pain)  . Morphine And Related Other (See Comments)    REACTION: "REALLY BAD STOMACH PAIN"  . Apple Diarrhea  . Tizanidine Hcl Anxiety    Depression  . Wheat Bran Other (See Comments)  :   His Current Medications Are:  Outpatient Encounter Medications as of 12/25/2020  Medication Sig  . acetaminophen (TYLENOL) 325 MG tablet Take 650 mg by mouth every 6 (six) hours as needed for mild pain, fever or headache.  . ALPRAZolam (XANAX) 1 MG tablet Take 0.5-1 mg by mouth daily as needed for anxiety.  Marland Kitchen HYDROcodone-acetaminophen (NORCO/VICODIN) 5-325 MG tablet Take 1-2 tablets by mouth every 6 (six) hours as needed for moderate pain or severe pain.  Marland Kitchen losartan (COZAAR) 100 MG tablet TAKE 1 TABLET(100 MG) BY MOUTH DAILY (Patient taking differently: Take 100 mg by mouth daily.)  . metoprolol tartrate (LOPRESSOR) 100 MG tablet TAKE 1 TABLET(100 MG) BY MOUTH TWICE DAILY (Patient taking differently: Take 100 mg by mouth 2 (two) times daily.)  . oxymetazoline (AFRIN) 0.05 % nasal spray Place 1 spray into both nostrils 2 (two) times daily as needed (FOR SINUS CONGESTION.).  Marland Kitchen TURMERIC PO Take 1  tablet by mouth 2 (two) times daily.  Marland Kitchen warfarin (COUMADIN) 5 MG tablet TAKE AS DIRECTED BY ANTICOAGULATION CLINIC (Patient taking differently: Take 7.5 mg by mouth See admin instructions. 7.5mg  on all days EXCEPT 10mg  on Saturday)  . [DISCONTINUED] spironolactone (ALDACTONE) 25 MG tablet TAKE 1  TABLET(25 MG) BY MOUTH DAILY (Patient taking differently: Take 25 mg by mouth daily.)   No facility-administered encounter medications on file as of 12/25/2020.  :  Review of Systems:  Out of a complete 14 point review of systems, all are reviewed and negative with the exception of these symptoms as listed below: Review of Systems  Neurological:       Pt presents today, PCP had questioned when the last time he had his OSA checked on. Pt states been 10 yrs since last SS. States was set up through [facility] but then due to cost increase he started to buy supplied from [facility]. Currently still using a CPAP machine. Overall feels like he sleeps well and didn't have any concerns. Just needing to establish care.  Epworth Sleepiness Scale 0= would never doze 1= slight chance of dozing 2= moderate chance of dozing 3= high chance of dozing  Sitting and reading:0 Watching TV:0 Sitting inactive in a public place (ex. Theater or meeting):0 As a passenger in a car for an hour without a break:0 Lying down to rest in the afternoon:1 Sitting and talking to someone:0 Sitting quietly after lunch (no alcohol):0 In a car, while stopped in traffic:0 Total:1     Objective:  Neurological Exam  Physical Exam Physical Examination:   Vitals:   12/25/20 1047  BP: (!) 168/98  Pulse: 68    General Examination: The patient is a very pleasant 70 y.o. male in no acute distress. He appears well-developed and well-nourished and well groomed.   HEENT: Normocephalic, atraumatic, pupils are equal, round and reactive to light, extraocular tracking is good without limitation to gaze excursion or nystagmus noted. Hearing is  grossly intact. Face is symmetric with normal facial animation. Speech is clear with no dysarthria noted. There is no hypophonia. There is no lip, neck/head, jaw or voice tremor. Neck is supple with full range of passive and active motion. There are no carotid bruits on auscultation. Oropharynx exam reveals: mild mouth dryness, adequate dental hygiene and moderate airway crowding, due to small airway entry and redundant soft palate, uvula not fully visualized, tonsils not fully visualized.  Mallampati class IV.  Neck circumference of 18 inches.  He does not have much in the way of overbite.  Tongue protrudes centrally in palate elevates symmetrically.    Chest: Clear to auscultation without wheezing, rhonchi or crackles noted.  Heart: S1+S2+0, regular and normal without murmurs, rubs or gallops noted.   Abdomen: Soft, non-tender and non-distended with normal bowel sounds appreciated on auscultation.  Extremities: There is no pitting edema in the distal lower extremities bilaterally.   Skin: Warm and dry without trophic changes noted.   Musculoskeletal: exam reveals no obvious joint deformities, he is status post bilateral knee replacements.   Neurologically:  Mental status: The patient is awake, alert and oriented in all 4 spheres. His immediate and remote memory, attention, language skills and fund of knowledge are appropriate. There is no evidence of aphasia, agnosia, apraxia or anomia. Speech is clear with normal prosody and enunciation. Thought process is linear. Mood is normal and affect is normal.  Cranial nerves II - XII are as described above under HEENT exam.  Motor exam: Normal bulk, strength and tone is noted. There is no tremor. Fine motor skills and coordination: grossly intact.  Cerebellar testing: No dysmetria or intention tremor. There is no truncal or gait ataxia.  Sensory exam: intact to light touch in the upper and lower extremities.  Gait, station and balance:  He stands  easily. No veering to one side is noted. No leaning to one side is noted. Posture is age-appropriate and stance is narrow based. Gait shows normal stride length and normal pace. No problems turning are noted.   Assessment and Plan:   In summary, Troy [name] is a very pleasant 70 y.o.-year old male with an underlying medical history of coronary artery disease, chronic A. fib, mitral regurgitation, left ventricular hypertrophy, history of V. fib, aortic dilatation of the ascending aorta, arthritis, hyperlipidemia, hypertension, lung nodule, obesity and recent hospitalization in January 2022 for sepsis likely secondary to cystitis with complication of thrombocytopenia, and acute kidney injury, who presents for evaluation of his obstructive sleep apnea.  He reports that he was diagnosed over 10 years ago with severe sleep apnea and has been on CPAP therapy since then.  He is currently on his second machine which is about 72 years old, pressure at 10 cm provides good control of his apnea per compliance data.  He is using a large full facemask from [facility], has not received supplies through a local DME company on a regular basis.  He is agreeable to pursuing a sleep study to requalify for a new machine and new equipment.  I will order a split-night sleep study with the diagnostic portion and a treatment portion.  He has benefited from treatment and is compliant with it.  He is commended on his treatment adherence and advised that we will call him to schedule his sleep study.  He is very motivated to continue with positive airway pressure treatment.  He is agreeable to establishing with a local DME company after his study.  I will write for a new machine based on his next sleep study.  We will also plan to follow-up after testing.  I answered all his questions today and he was in agreement with the plan.  Thank you very much for allowing me to participate in the care of this nice patient. If I can be of any further  assistance to you please do not hesitate to call me at [phone].  Sincerely,   [name], MD, PhD

## 2021-01-02 ENCOUNTER — Ambulatory Visit (INDEPENDENT_AMBULATORY_CARE_PROVIDER_SITE_OTHER): Payer: Medicare Other | Admitting: Neurology

## 2021-01-02 ENCOUNTER — Other Ambulatory Visit: Payer: Self-pay

## 2021-01-02 ENCOUNTER — Telehealth: Payer: Self-pay | Admitting: Dermatology

## 2021-01-02 DIAGNOSIS — R351 Nocturia: Secondary | ICD-10-CM

## 2021-01-02 DIAGNOSIS — L570 Actinic keratosis: Secondary | ICD-10-CM | POA: Diagnosis not present

## 2021-01-02 DIAGNOSIS — G4720 Circadian rhythm sleep disorder, unspecified type: Secondary | ICD-10-CM

## 2021-01-02 DIAGNOSIS — L814 Other melanin hyperpigmentation: Secondary | ICD-10-CM | POA: Diagnosis not present

## 2021-01-02 DIAGNOSIS — R9431 Abnormal electrocardiogram [ECG] [EKG]: Secondary | ICD-10-CM

## 2021-01-02 DIAGNOSIS — G4733 Obstructive sleep apnea (adult) (pediatric): Secondary | ICD-10-CM

## 2021-01-02 DIAGNOSIS — Z85828 Personal history of other malignant neoplasm of skin: Secondary | ICD-10-CM | POA: Diagnosis not present

## 2021-01-02 DIAGNOSIS — C44622 Squamous cell carcinoma of skin of right upper limb, including shoulder: Secondary | ICD-10-CM | POA: Diagnosis not present

## 2021-01-02 DIAGNOSIS — C44519 Basal cell carcinoma of skin of other part of trunk: Secondary | ICD-10-CM | POA: Diagnosis not present

## 2021-01-02 DIAGNOSIS — I482 Chronic atrial fibrillation, unspecified: Secondary | ICD-10-CM

## 2021-01-02 DIAGNOSIS — R030 Elevated blood-pressure reading, without diagnosis of hypertension: Secondary | ICD-10-CM

## 2021-01-02 DIAGNOSIS — E669 Obesity, unspecified: Secondary | ICD-10-CM

## 2021-01-02 MED ORDER — TOLAK 4 % EX CREA: 1.0000 "application " | TOPICAL_CREAM | Freq: Every day | CUTANEOUS | 1 refills | Status: DC

## 2021-01-02 NOTE — Telephone Encounter (Signed)
Was given Rx through mail order pharmacy that compounded it after December visit (he doesn't know name) for one tube. He thinks he needs a second tube. He's not sure if there was a refill available and would call the pharmacy but he doesn't have the number

## 2021-01-03 DIAGNOSIS — I10 Essential (primary) hypertension: Secondary | ICD-10-CM | POA: Diagnosis not present

## 2021-01-03 DIAGNOSIS — I4891 Unspecified atrial fibrillation: Secondary | ICD-10-CM | POA: Diagnosis not present

## 2021-01-03 DIAGNOSIS — Z7901 Long term (current) use of anticoagulants: Secondary | ICD-10-CM | POA: Diagnosis not present

## 2021-01-07 NOTE — Addendum Note (Signed)
Addended by: [name] on: 01/07/2021 04:34 PM   Modules accepted: Orders

## 2021-01-07 NOTE — Progress Notes (Signed)
Patient referred by Dr. Ardeth Perfect for reevaluation of his obstructive sleep apnea.  He had sleep testing over 10 years ago.  He is on his second CPAP machine and compliant with treatment.  He should qualify for new machine.  He had a baseline sleep study on 01/01/2021 which confirmed moderate obstructive sleep apnea.  Please advise patient that I would like to write for a new CPAP machine.  We can use the settings he has been on which is 10 cm of pressure.  We can go through [facility] or a new DME company if he prefers.  Please arrange for a follow-up appointment accordingly, once he starts using the new machine.  [name], MD, PhD [facility])

## 2021-01-07 NOTE — Procedures (Signed)
PATIENT'S NAME:  Troy [name], Troy [name] DOB:      1951/02/25      MR#:    [id number]     DATE OF RECORDING: 01/02/2021 REFERRING M.D.:  Velna Hatchet, MD Study Performed:   Baseline Polysomnogram HISTORY: 70 year old man with a history of coronary artery disease, chronic A. fib, mitral regurgitation, left ventricular hypertrophy, history of V. fib, aortic dilatation of the ascending aorta, arthritis, hyperlipidemia, hypertension, lung nodule, obesity and recent hospitalization in January 2022 for sepsis likely secondary to cystitis with complication of thrombocytopenia, and acute kidney injury, who was previously diagnosed with obstructive sleep apnea and placed on CPAP therapy. He has been compliant with CPAP and qualifies for a new machine. Sleep testing was over 10 years ago. He presents for re-evaluation. The patient endorsed the Epworth Sleepiness Scale at 1 points. The patient's weight 247 pounds with a height of 71 (inches), resulting in a BMI of 34.6 kg/m2. The patient's neck circumference measured 18 inches.  CURRENT MEDICATIONS: Tylenol, Xanax, Norco, Cozaar, Lopressor, Afrin, Turmeric, Coumadin,   PROCEDURE:  This is a multichannel digital polysomnogram utilizing the Somnostar 11.2 system.  Electrodes and sensors were applied and monitored per AASM Specifications.   EEG, EOG, Chin and Limb EMG, were sampled at 200 Hz.  ECG, Snore and Nasal Pressure, Thermal Airflow, Respiratory Effort, CPAP Flow and Pressure, Oximetry was sampled at 50 Hz. Digital video and audio were recorded.      BASELINE STUDY  Lights Out was at 21:17 and Lights On at 05:00.  Total recording time (TRT) was 463.5 minutes, with a total sleep time (TST) of 317.5 minutes.   The patient's sleep latency was 11 minutes.  REM latency was 92 minutes.  The sleep efficiency was 68.5 %.     SLEEP ARCHITECTURE: WASO (Wake after sleep onset) was 137 minutes with several longer periods of wakefulness. There were 12.5 minutes in Stage N1,  283 minutes Stage N2, 12.5 minutes Stage N3 and 9.5 minutes in Stage REM.  The percentage of Stage N1 was 3.9%, Stage N2 was 89.1%, which is markedly increased, Stage N3 was 3.9% and Stage R (REM sleep) was 3.%, which is markedly reduced. The arousals were noted as: 9 were spontaneous, 0 were associated with PLMs, 35 were associated with respiratory events.  RESPIRATORY ANALYSIS:  There were a total of 145 respiratory events:  78 obstructive apneas, 4 central apneas and 1 mixed apneas with a total of 83 apneas and an apnea index (AI) of 15.7 /hour. There were 62 hypopneas with a hypopnea index of 11.7 /hour. The patient also had 0 respiratory event related arousals (RERAs).      The total APNEA/HYPOPNEA INDEX (AHI) was 27.4/hour and the total RESPIRATORY DISTURBANCE INDEX was  27.4 /hour.  2 events occurred in REM sleep and 125 events in NREM. The REM AHI was  12.6 /hour, versus a non-REM AHI of 27.9. The patient spent 196.5 minutes of total sleep time in the supine position and 121 minutes in non-supine.. The supine AHI was 26.6 versus a non-supine AHI of 28.8.  OXYGEN SATURATION & C02:  The Wake baseline 02 saturation was 96%, with the lowest being 83%. Time spent below 89% saturation equaled 90 minutes.  PERIODIC LIMB MOVEMENTS: The patient had a total of 0 Periodic Limb Movements.  The Periodic Limb Movement (PLM) index was 0 and the PLM Arousal index was 0/hour.  Audio and video analysis did not show any abnormal or unusual movements, behaviors, phonations or vocalizations. The  patient took 4 bathroom breaks. Snoring was noted, ranging from mild to loud. The EKG was irregular throughout the night, in keeping with atrial fibrillation.   Post-study, the patient indicated that sleep was worse than usual.   IMPRESSION: 1. Obstructive Sleep Apnea (OSA) 2. Dysfunctions associated with sleep stages or arousal from sleep 3. Non-specific abnormal EKG  RECOMMENDATIONS: 1. This study demonstrates  moderate obstructive sleep apnea, with a total AHI of 27.4/hour, and O2 nadir of 83%. Ongoing treatment with positive airway pressure is recommended. The patient has been on CPAP of 10 cm with EPR of 2 cm. I will prescribe a new CPAP machine at the current settings (residual AHI was 1/hour on a recent compliance download). Other treatment options may include dental or surgical options; weight loss is recommended. Please note that untreated obstructive sleep apnea may carry additional perioperative morbidity. Patients with significant obstructive sleep apnea should receive perioperative PAP therapy and the surgeons and particularly the anesthesiologist should be informed of the diagnosis and the severity of the sleep disordered breathing. 2. This study shows sleep fragmentation and abnormal sleep stage percentages; these are nonspecific findings and per se do not signify an intrinsic sleep disorder or a cause for the patient's sleep-related symptoms. Causes include (but are not limited to) the first night effect of the sleep study, circadian rhythm disturbances, medication effect or an underlying mood disorder or medical problem.  3. The patient should be cautioned not to drive, work at heights, or operate dangerous or heavy equipment when tired or sleepy. Review and reiteration of good sleep hygiene measures should be pursued with any patient. 4. The study showed an irregular single lead EKG, in keeping with atrial fibrillation; clinical correlation is recommended.  5. The patient will be seen in follow-up in the sleep clinic at [facility] for discussion of the test results, symptom and treatment compliance review, further management strategies, etc. The referring provider will be notified of the test results.  I certify that I have reviewed the entire raw data recording prior to the issuance of this report in accordance with the Standards of Accreditation of the American Academy of Sleep Medicine (AASM)  [name], MD, PhD Diplomat, American Board of Neurology and Sleep Medicine (Neurology and Sleep Medicine)

## 2021-01-08 ENCOUNTER — Telehealth: Payer: Self-pay

## 2021-01-08 NOTE — Telephone Encounter (Signed)
Call patient the results split-night study CPAP machine DME company 6 months from" I have sent order to [facility] for processing of new machine patient was advised of the national shortage of CPAP machine and start date could be 6 to 12 weeks out.  Patient will be called in April to see if he was received a start date for his new machine and then we will set 31 to 90-day follow-up.  Patient understands the compliance needed for new CPAP machine

## 2021-01-08 NOTE — Telephone Encounter (Signed)
-----   Message from [name], MD sent at 01/07/2021  4:34 PM EST ----- Patient referred by Dr. Ardeth Perfect for reevaluation of his obstructive sleep apnea.  He had sleep testing over 10 years ago.  He is on his second CPAP machine and compliant with treatment.  He should qualify for new machine.  He had a baseline sleep study on 01/01/2021 which confirmed moderate obstructive sleep apnea.  Please advise patient that I would like to write for a new CPAP machine.  We can use the settings he has been on which is 10 cm of pressure.  We can go through [facility] or a new DME company if he prefers.  Please arrange for a follow-up appointment accordingly, once he starts using the new machine.  [name], MD, PhD [facility])

## 2021-01-22 ENCOUNTER — Other Ambulatory Visit: Payer: Self-pay | Admitting: Cardiology

## 2021-01-22 ENCOUNTER — Other Ambulatory Visit: Payer: Self-pay | Admitting: Interventional Cardiology

## 2021-01-22 NOTE — Telephone Encounter (Signed)
Outpatient Medication Detail   Disp Refills Start End   metoprolol tartrate (LOPRESSOR) 100 MG tablet 60 tablet 1 01/22/2021    Sig: TAKE 1 TABLET(100 MG) BY MOUTH TWICE DAILY - Pt needs to make appt with provider for further refills - 1st attempt   Sent to pharmacy as: metoprolol tartrate (LOPRESSOR) 100 MG tablet   Notes to Pharmacy: Pt needs to make appt with provider for further refills - 1st attempt   E-Prescribing Status: Receipt confirmed by pharmacy (01/22/2021  9:45 AM EDT)     [place], [address] AT [place]

## 2021-01-23 ENCOUNTER — Telehealth: Payer: Self-pay | Admitting: Cardiology

## 2021-01-23 MED ORDER — METOPROLOL TARTRATE 100 MG PO TABS: ORAL_TABLET | ORAL | 0 refills | Status: DC

## 2021-01-23 NOTE — Telephone Encounter (Signed)
Pt's medication was sent to pt's pharmacy as requested. Confirmation received.  °

## 2021-01-23 NOTE — Telephone Encounter (Signed)
*  STAT* If patient is at the pharmacy, call can be transferred to refill team.   1. Which medications need to be refilled? (please list name of each medication and dose if known) metoprolol tartrate (LOPRESSOR) 100 MG tablet  2. Which pharmacy/location (including street and city if local pharmacy) is medication to be sent to? [facility] [place], [address] AT [address] [place] [address]  3. Do they need a 30 day or 90 day supply? [place]

## 2021-01-28 DIAGNOSIS — H04123 Dry eye syndrome of bilateral lacrimal glands: Secondary | ICD-10-CM | POA: Diagnosis not present

## 2021-01-28 DIAGNOSIS — H35372 Puckering of macula, left eye: Secondary | ICD-10-CM | POA: Diagnosis not present

## 2021-01-28 DIAGNOSIS — H52203 Unspecified astigmatism, bilateral: Secondary | ICD-10-CM | POA: Diagnosis not present

## 2021-01-28 DIAGNOSIS — Z961 Presence of intraocular lens: Secondary | ICD-10-CM | POA: Diagnosis not present

## 2021-01-31 DIAGNOSIS — Z7901 Long term (current) use of anticoagulants: Secondary | ICD-10-CM | POA: Diagnosis not present

## 2021-01-31 DIAGNOSIS — F419 Anxiety disorder, unspecified: Secondary | ICD-10-CM | POA: Diagnosis not present

## 2021-01-31 DIAGNOSIS — I10 Essential (primary) hypertension: Secondary | ICD-10-CM | POA: Diagnosis not present

## 2021-01-31 DIAGNOSIS — I4891 Unspecified atrial fibrillation: Secondary | ICD-10-CM | POA: Diagnosis not present

## 2021-02-08 DIAGNOSIS — Z23 Encounter for immunization: Secondary | ICD-10-CM | POA: Diagnosis not present

## 2021-03-07 DIAGNOSIS — Z7901 Long term (current) use of anticoagulants: Secondary | ICD-10-CM | POA: Diagnosis not present

## 2021-03-07 DIAGNOSIS — I4891 Unspecified atrial fibrillation: Secondary | ICD-10-CM | POA: Diagnosis not present

## 2021-03-23 ENCOUNTER — Other Ambulatory Visit: Payer: Self-pay | Admitting: Cardiology

## 2021-04-01 ENCOUNTER — Other Ambulatory Visit: Payer: Self-pay

## 2021-04-01 ENCOUNTER — Ambulatory Visit (INDEPENDENT_AMBULATORY_CARE_PROVIDER_SITE_OTHER): Payer: Medicare Other | Admitting: Cardiology

## 2021-04-01 ENCOUNTER — Encounter: Payer: Self-pay | Admitting: Cardiology

## 2021-04-01 VITALS — BP 138/76 | HR 70 | Ht 71.0 in | Wt 241.0 lb

## 2021-04-01 DIAGNOSIS — I4811 Longstanding persistent atrial fibrillation: Secondary | ICD-10-CM

## 2021-04-01 NOTE — Progress Notes (Signed)
Electrophysiology Office Note   Date:  04/01/2021   ID:  Troy [name], DOB September 19, 1951, MRN [id number]  PCP:  Troy Hatchet, MD  Cardiologist:  Troy [name] Primary Electrophysiologist:  Troy Azzaro Meredith Leeds, MD    No chief complaint on file.    History of Present Illness: Troy [name] is a 70 y.o. male who is being seen today for the evaluation of atrial fibrillation at the request of Troy Hatchet, MD. Presenting today for electrophysiology evaluation.   He has a history of coronary artery disease status post mid LAD and PDA stent with VF arrest, PCI in [place] in 2010, hypertension, hyperlipidemia, MR, atrial fibrillation.  He was started on amiodarone and converted to sinus rhythm.  Unfortunately he went back into atrial fibrillation for 618.  He had complaints of shortness of breath.  He had cardioversion 02/20/2017.  Amiodarone has since been stopped.  He has been in atrial fibrillation.  He has not been interested in A. fib ablation.  Today, denies symptoms of palpitations, chest pain, shortness of breath, orthopnea, PND, lower extremity edema, claudication, dizziness, presyncope, syncope, bleeding, or neurologic sequela. The patient is tolerating medications without difficulties.  Since last being seen he has done well.  He has no chest pain or shortness of breath.  Is able do all his daily activities.  He does note that it takes him a little longer to get warmed up when he is trying to exercise, playing pickle ball.  Otherwise, he has no complaints.   Past Medical History:  Diagnosis Date  . Arthritis    "knees" (01/02/2017)  . Ascending aorta dilatation (HCC)     78mm by CT 2017 and 62mm by CT 2019, 37mm by echo 07/2020  . CAD (coronary artery disease)    a. s/p PCI in 2010 in [place]. b. Unstable angina/LHC 04/11/8314 complicated by V fib arrest after contrast injection. Cath 06/19/2015 s/p DES to mid LAD and RPDA. c. 11/2016: chest pain/ abnormal nuclear stress test  prompting cath 11/13/16 showing 80% ostial diag, 60% mid RCA, no acute lesions, medical therapy recommended.  . Dyslipidemia   . Essential hypertension   . Lung nodule < 6cm on CT 06/16/15   Right lung  . Mitral regurgitation    mild to moderate by echo 07/2020  . OSA on CPAP   . Persistent atrial fibrillation ([place])    a. 06/2015 noted to be in new a-fib when arrived with unstable angina. Converted to NSR after being shocked in the cath lab for vfib;  b. 06/2015 Eliquis initiated as PAF noted on event monitor. c. Recurrent atrial fib?flutter 10/2016, issues with recurrent AF requiring multiple DCCVs in 02/2017. Failed sotalol, not candidate for Tikosyn due to cost/QTC, Multaq not felt likely strong enough.  . Pulmonary nodule 07/2015   a. 1.5 x 1.2 cm smoothly marginated nodule in the central aspect of the right lower lobe with recommendation correlation with nonemergent PET-CT to exclude a neoplasm , stable by CT 07/2016  . Severe left ventricular hypertrophy   . Ventricular fibrillation ([place])    a. occured during cath 06/18/2015.   Past Surgical History:  Procedure Laterality Date  . CARDIAC CATHETERIZATION N/A 06/18/2015   Procedure: Left Heart Cath and Coronary Angiography;  Surgeon: Troy Blanks, MD;  Location: [place] [facility];  Service: Cardiovascular;  Laterality: N/A;  . CARDIAC CATHETERIZATION N/A 06/19/2015   Procedure: Coronary Stent Intervention;  Surgeon: Troy M Martinique, MD;  Location: [place] [facility];  Service: Cardiovascular;  Laterality: N/A;  . CARDIAC CATHETERIZATION N/A 11/13/2016   Procedure: Left Heart Cath and Coronary Angiography;  Surgeon: Troy Harp, MD;  Location: [place] [facility];  Service: Cardiovascular;  Laterality: N/A;  . CARDIOVERSION N/A 11/17/2016   Procedure: CARDIOVERSION;  Surgeon: Troy Dresser, MD;  Location: [facility];  Service: Cardiovascular;  Laterality: N/A;  . CARDIOVERSION N/A 01/03/2017   Procedure: CARDIOVERSION;  Surgeon: Troy Perla, MD;  Location: [place];  Service: Cardiovascular;  Laterality: N/A;  . CARDIOVERSION N/A 02/05/2017   Procedure: Cardioversion;  Surgeon: Troy Lance, MD;  Location: [place] [facility];  Service: Cardiovascular;  Laterality: N/A;  . CARDIOVERSION N/A 02/20/2017   Procedure: Cardioversion;  Surgeon: Troy Lance, MD;  Location: [place] [facility];  Service: Cardiovascular;  Laterality: N/A;  . CARTILAGE SURGERY Bilateral    "thumbs"  . CATARACT EXTRACTION W/ INTRAOCULAR LENS  IMPLANT, BILATERAL Bilateral   . CORONARY ANGIOPLASTY WITH STENT PLACEMENT  ~ 2007  . JOINT REPLACEMENT    . KNEE ARTHROSCOPY Right   . RETINAL DETACHMENT SURGERY Bilateral    "laser thing"  . TEE WITHOUT CARDIOVERSION N/A 11/17/2016   Procedure: TRANSESOPHAGEAL ECHOCARDIOGRAM (TEE);  Surgeon: Troy Dresser, MD;  Location: [place];  Service: Cardiovascular;  Laterality: N/A;  . TOTAL KNEE ARTHROPLASTY Left 2000s  . TOTAL KNEE ARTHROPLASTY Right 02/08/2018   Procedure: RIGHT TOTAL KNEE ARTHROPLASTY;  Surgeon: Troy Arabian, MD;  Location: [facility];  Service: Orthopedics;  Laterality: Right;  with block     Current Outpatient Medications  Medication Sig Dispense Refill  . acetaminophen (TYLENOL) 325 MG tablet Take 650 mg by mouth every 6 (six) hours as needed for mild pain, fever or headache.    . ALPRAZolam (XANAX) 1 MG tablet Take 0.5-1 mg by mouth daily as needed for anxiety.    . Fluorouracil (TOLAK) 4 % CREA Apply 1 application topically at bedtime. 40 g 1  . HYDROcodone-acetaminophen (NORCO/VICODIN) 5-325 MG tablet Take 1-2 tablets by mouth every 6 (six) hours as needed for moderate pain or severe pain. 20 tablet 0  . losartan (COZAAR) 100 MG tablet Take 1 tablet (100 mg total) by mouth daily. Please keep upcoming appt in May 2022 with Troy [name] before anymore refills. Thank you 90 tablet 0  . metoprolol tartrate (LOPRESSOR) 100 MG tablet TAKE 1 TABLET(100 MG) BY MOUTH TWICE DAILY - please keep  upcoming appt in May 2022 with Troy [name] before anymore refills. Thank you 180 tablet 0  . oxymetazoline (AFRIN) 0.05 % nasal spray Place 1 spray into both nostrils 2 (two) times daily as needed (FOR SINUS CONGESTION.). 30 mL 0  . TURMERIC PO Take 1 tablet by mouth 2 (two) times daily.    Marland Kitchen warfarin (COUMADIN) 5 MG tablet TAKE AS DIRECTED BY ANTICOAGULATION CLINIC (Patient taking differently: Take 7.5 mg by mouth See admin instructions. 7.5mg  on all days EXCEPT 10mg  on Saturday) 150 tablet 1   No current facility-administered medications for this visit.    Allergies:   Gluten meal, Iodinated diagnostic agents, Metrizamide, Tizanidine, Whey, Morphine and related, Apple, Tizanidine hcl, and Wheat bran   Social History:  The patient  reports that he has never smoked. He has never used smokeless tobacco. He reports current alcohol use of about 2.0 standard drinks of alcohol per week. He reports that he does not use drugs.   Family History:  The patient's family history includes Diabetes in his sister; Drug abuse in his  father; Emphysema in his mother; Heart attack in his father, maternal grandfather, and paternal grandfather; Lung cancer in his mother; Stroke in his mother.   ROS:  Please see the history of present illness.   Otherwise, review of systems is positive for none.   All other systems are reviewed and negative.   PHYSICAL EXAM: VS:  BP 138/76   Pulse 70   Ht 5\' 11"  (1.803 m)   Wt 241 lb (109.3 kg)   BMI 33.61 kg/m  , BMI Body mass index is 33.61 kg/m. GEN: Well nourished, well developed, in no acute distress  HEENT: normal  Neck: no JVD, carotid bruits, or masses Cardiac: RRR; no murmurs, rubs, or gallops,no edema  Respiratory:  clear to auscultation bilaterally, normal work of breathing GI: soft, nontender, nondistended, + BS MS: no deformity or atrophy  Skin: warm and dry Neuro:  Strength and sensation are intact Psych: euthymic mood, full affect  EKG:  EKG is ordered  today. Personal review of the ekg ordered shows atrial fibrillation, rate 70   Recent Labs: 11/16/2020: ALT 18; BUN 15; Creatinine, Ser 0.91; Potassium 3.9; Sodium 138 11/17/2020: Hemoglobin 12.9; Platelets 124    Lipid Panel     Component Value Date/Time   CHOL 201 (H) 11/16/2017 1027   TRIG 178 (H) 11/16/2017 1027   HDL 49 11/16/2017 1027   CHOLHDL 4.1 11/16/2017 1027   CHOLHDL 2.5 08/15/2015 0912   VLDL 15 08/15/2015 0912   LDLCALC 116 (H) 11/16/2017 1027     Wt Readings from Last 3 Encounters:  04/01/21 241 lb (109.3 kg)  12/25/20 247 lb (112 kg)  11/16/20 252 lb 13.9 oz (114.7 kg)      Other studies Reviewed: Additional studies/ records that were reviewed today include: TTE 11/18/16  Review of the above records today demonstrates:  - Left ventricle: The cavity size was normal. There was severe   concentric hypertrophy. Systolic function was normal. The   estimated ejection fraction was in the range of 60% to 65%. Wall   motion was normal; there were no regional wall motion   abnormalities. The study is not technically sufficient to allow   evaluation of LV diastolic function. - Aortic valve: Trileaflet; mildly calcified leaflets. There was   mild regurgitation. - Mitral valve: Mildly thickened leaflets . There was trivial   regurgitation. - Left atrium: Moderately dilated. - Right ventricle: The cavity size was mildly dilated. - Right atrium: Severely dilated. - Tricuspid valve: There was trivial regurgitation. - Pulmonary arteries: PA peak pressure: 20 mm Hg (S). - Inferior vena cava: The vessel was normal in size. The   respirophasic diameter changes were in the normal range (= 50%),   consistent with normal central venous pressure.  LHC 11/13/16  Ost RPDA to RPDA lesion, 0 %stenosed.  Prox Cx to Mid Cx lesion, 0 %stenosed.  Prox LAD to Mid LAD lesion, 0 %stenosed.  Ost 1st Diag lesion, 80 %stenosed.  Mid RCA lesion, 60 %stenosed.  ASSESSMENT AND  PLAN:  1.  Longstanding persistent atrial fibrillation: CHA2DS2-VASc 3.  Currently on Coumadin.  He is minimally symptomatic from his atrial fibrillation.  We Odie Rauen continue with current management.  2.  Hypertension: Currently well controlled  3.  Coronary artery disease with angina: No current chest pain.  Continue current management.  4.  Obstructive sleep apnea: CPAP compliance encouraged  Current medicines are reviewed at length with the patient today.   The patient does not have concerns regarding his  medicines.  The following changes were made today: None  Labs/ tests ordered today include:  Orders Placed This Encounter  Procedures  . EKG 12-Lead     Disposition:   FU with Bradden Tadros 12 months  Signed, Jaisean Monteforte Meredith Leeds, MD  04/01/2021 11:02 AM     [facility] [address] [place] [address] [phone] (office) [phone] (fax)

## 2021-04-01 NOTE — Patient Instructions (Addendum)
Medication Instructions:  Your physician recommends that you continue on your current medications as directed. Please refer to the Current Medication list given to you today.  *If you need a refill on your cardiac medications before your next appointment, please call your pharmacy*   Lab Work: None ordered  Testing/Procedures: None ordered   Follow-Up: At [facility], you and your health needs are our priority.  As part of our continuing mission to provide you with exceptional heart care, we have created designated Provider Care Teams.  These Care Teams include your primary Cardiologist (physician) and Advanced Practice Providers (APPs -  Physician Assistants and Nurse Practitioners) who all work together to provide you with the care you need, when you need it.  Your next appointment:   1 year(s)  The format for your next appointment:   In Person  Provider:   Allegra Lai, MD  Your physician recommends that you schedule an appointment to re-establish with Dr. Radford Pax   Thank you for choosing [facility]!!   Trinidad Curet, RN [phone]

## 2021-04-02 ENCOUNTER — Ambulatory Visit (INDEPENDENT_AMBULATORY_CARE_PROVIDER_SITE_OTHER): Payer: Medicare Other | Admitting: Cardiology

## 2021-04-02 ENCOUNTER — Encounter: Payer: Self-pay | Admitting: Cardiology

## 2021-04-02 VITALS — BP 144/78 | HR 86 | Ht 71.0 in | Wt 242.6 lb

## 2021-04-02 DIAGNOSIS — I4819 Other persistent atrial fibrillation: Secondary | ICD-10-CM | POA: Diagnosis not present

## 2021-04-02 DIAGNOSIS — I10 Essential (primary) hypertension: Secondary | ICD-10-CM | POA: Diagnosis not present

## 2021-04-02 DIAGNOSIS — E7800 Pure hypercholesterolemia, unspecified: Secondary | ICD-10-CM

## 2021-04-02 DIAGNOSIS — G4733 Obstructive sleep apnea (adult) (pediatric): Secondary | ICD-10-CM

## 2021-04-02 DIAGNOSIS — I2510 Atherosclerotic heart disease of native coronary artery without angina pectoris: Secondary | ICD-10-CM | POA: Diagnosis not present

## 2021-04-02 DIAGNOSIS — J309 Allergic rhinitis, unspecified: Secondary | ICD-10-CM

## 2021-04-02 MED ORDER — LOSARTAN POTASSIUM 100 MG PO TABS: 100.0000 mg | ORAL_TABLET | Freq: Every day | ORAL | 3 refills | Status: DC

## 2021-04-02 MED ORDER — METOPROLOL TARTRATE 100 MG PO TABS: ORAL_TABLET | ORAL | 3 refills | Status: DC

## 2021-04-02 NOTE — Progress Notes (Addendum)
Cardiology Office Note    Date:  04/02/2021   ID:  Troy [name], DOB 17-Feb-1951, MRN [id number]  PCP:  Velna Hatchet, MD  Cardiologist:  Fransico Him, MD   Chief Complaint  Patient presents with  . Coronary Artery Disease  . Atrial Fibrillation  . Hypertension  . Hyperlipidemia  . Sleep Apnea    History of Present Illness:  Troy [name] is a 70 y.o. male with a history of ASCAD with DES mLAD & RPDA w/ VF arrest 06/2015, PCI in [place] 2010, HTN, HLD, MR, PAF (not anticoagulated in the past), pulm nodule stable 07/2016.  He had recurrent afib and was loaded with Amio and underwent TEE and cardioverted to NSR but then went back into afib 02/13/2017.  His amio was stopped and had repeat DCCV 02/20/2017. He was seen by Dr. Curt Bears with EP and stated that he did not wish to have any further DCCV if he went back into afib and would prefer ablation.  He also has OSA and is doing well with his CPAP device. He was admitted in January 2018 with CP and nuclear stress test showed apical ischemia.  He underwent cath showing patent stents with 80% diag and 60% mid RCA.    He has been followed by Dr. Curt Bears and now has longstanding persistent atrial fibrillation on chronic anticoagulation with coumadin for a CHADS2VASC score of 3.    He is here today for followup and is doing well.  He has chronic DOE when exercising but after about 20 minutes of playing pickle ball the SOB goes away.  He denies any chest pain or pressure, PND, orthopnea, LE edema, dizziness, palpitations or syncope. He is compliant with his meds and is tolerating meds with no SE.    He is doing well with his CPAP device and thinks that he has gotten used to it.  He tolerates the mask and feels the pressure is adequate.  Since going on CPAP he feels rested in the am but sometimes will nap during the day.  He denies any significant mouth or nasal dryness.  He is using afrin for his sinuses nightly as they get stuffed up at night.  He  does not think that he snores.  He says that his device is old and he is eligible for a new device which he is waiting for.  There is a backorder on devices due to COVID 19.    Past Medical History:  Diagnosis Date  . Arthritis    "knees" (01/02/2017)  . Ascending aorta dilatation (HCC)     69mm by CT 2017 and 57mm by CT 2019, 61mm by echo 07/2020  . CAD (coronary artery disease)    a. s/p PCI in 2010 in [place]. b. Unstable angina/LHC 06/18/2799 complicated by V fib arrest after contrast injection. Cath 06/19/2015 s/p DES to mid LAD and RPDA. c. 11/2016: chest pain/ abnormal nuclear stress test prompting cath 11/13/16 showing 80% ostial diag, 60% mid RCA, no acute lesions, medical therapy recommended.  . Dyslipidemia   . Essential hypertension   . Lung nodule < 6cm on CT 06/16/15   Right lung  . Mitral regurgitation    mild to moderate by echo 07/2020  . OSA on CPAP   . Persistent atrial fibrillation ([place])    a. 06/2015 noted to be in new a-fib when arrived with unstable angina. Converted to NSR after being shocked in the cath lab for vfib;  b. 06/2015 Eliquis initiated as PAF noted  on event monitor. c. Recurrent atrial fib?flutter 10/2016, issues with recurrent AF requiring multiple DCCVs in 02/2017. Failed sotalol, not candidate for Tikosyn due to cost/QTC, Multaq not felt likely strong enough.  . Pulmonary nodule 07/2015   a. 1.5 x 1.2 cm smoothly marginated nodule in the central aspect of the right lower lobe with recommendation correlation with nonemergent PET-CT to exclude a neoplasm , stable by CT 07/2016  . Severe left ventricular hypertrophy   . Ventricular fibrillation ([place])    a. occured during cath 06/18/2015.    Past Surgical History:  Procedure Laterality Date  . CARDIAC CATHETERIZATION N/A 06/18/2015   Procedure: Left Heart Cath and Coronary Angiography;  Surgeon: Burnell Blanks, MD;  Location: [facility];  Service: Cardiovascular;  Laterality: N/A;  . CARDIAC  CATHETERIZATION N/A 06/19/2015   Procedure: Coronary Stent Intervention;  Surgeon: Peter M Martinique, MD;  Location: [facility];  Service: Cardiovascular;  Laterality: N/A;  . CARDIAC CATHETERIZATION N/A 11/13/2016   Procedure: Left Heart Cath and Coronary Angiography;  Surgeon: Lorretta Harp, MD;  Location: [facility];  Service: Cardiovascular;  Laterality: N/A;  . CARDIOVERSION N/A 11/17/2016   Procedure: CARDIOVERSION;  Surgeon: Larey Dresser, MD;  Location: [facility];  Service: Cardiovascular;  Laterality: N/A;  . CARDIOVERSION N/A 01/03/2017   Procedure: CARDIOVERSION;  Surgeon: Lelon Perla, MD;  Location: [facility];  Service: Cardiovascular;  Laterality: N/A;  . CARDIOVERSION N/A 02/05/2017   Procedure: Cardioversion;  Surgeon: Evans Lance, MD;  Location: [facility];  Service: Cardiovascular;  Laterality: N/A;  . CARDIOVERSION N/A 02/20/2017   Procedure: Cardioversion;  Surgeon: Evans Lance, MD;  Location: [facility];  Service: Cardiovascular;  Laterality: N/A;  . CARTILAGE SURGERY Bilateral    "thumbs"  . CATARACT EXTRACTION W/ INTRAOCULAR LENS  IMPLANT, BILATERAL Bilateral   . CORONARY ANGIOPLASTY WITH STENT PLACEMENT  ~ 2007  . JOINT REPLACEMENT    . KNEE ARTHROSCOPY Right   . RETINAL DETACHMENT SURGERY Bilateral    "laser thing"  . TEE WITHOUT CARDIOVERSION N/A 11/17/2016   Procedure: TRANSESOPHAGEAL ECHOCARDIOGRAM (TEE);  Surgeon: Larey Dresser, MD;  Location: [facility];  Service: Cardiovascular;  Laterality: N/A;  . TOTAL KNEE ARTHROPLASTY Left 2000s  . TOTAL KNEE ARTHROPLASTY Right 02/08/2018   Procedure: RIGHT TOTAL KNEE ARTHROPLASTY;  Surgeon: Gaynelle Arabian, MD;  Location: [facility];  Service: Orthopedics;  Laterality: Right;  with block    Current Medications: Current Meds  Medication Sig  . acetaminophen (TYLENOL) 325 MG tablet Take 650 mg by mouth every 6 (six) hours as needed for mild pain, fever or headache.  . ALPRAZolam (XANAX) 1  MG tablet Take 0.5-1 mg by mouth daily as needed for anxiety.  . Fluorouracil (TOLAK) 4 % CREA Apply 1 application topically at bedtime.  Marland Kitchen HYDROcodone-acetaminophen (NORCO/VICODIN) 5-325 MG tablet Take 1-2 tablets by mouth every 6 (six) hours as needed for moderate pain or severe pain.  Marland Kitchen losartan (COZAAR) 100 MG tablet Take 1 tablet (100 mg total) by mouth daily. Please keep upcoming appt in May 2022 with Dr. Curt Bears before anymore refills. Thank you  . metoprolol tartrate (LOPRESSOR) 100 MG tablet TAKE 1 TABLET(100 MG) BY MOUTH TWICE DAILY - please keep upcoming appt in May 2022 with Dr. Curt Bears before anymore refills. Thank you  . oxymetazoline (AFRIN) 0.05 % nasal spray Place 1 spray into both nostrils 2 (two) times daily as needed (FOR SINUS CONGESTION.).  Marland Kitchen  TURMERIC PO Take 1 tablet by mouth 2 (two) times daily.  Marland Kitchen warfarin (COUMADIN) 5 MG tablet TAKE AS DIRECTED BY ANTICOAGULATION CLINIC (Patient taking differently: Take 7.5 mg by mouth See admin instructions. 7.5mg  on all days EXCEPT 10mg  on Saturday)    Allergies:   Gluten meal, Iodinated diagnostic agents, Metrizamide, Tizanidine, Whey, Morphine and related, Apple, Tizanidine hcl, and Wheat bran   Social History   Socioeconomic History  . Marital status: Divorced    Spouse name: Not on file  . Number of children: Not on file  . Years of education: Not on file  . Highest education level: Not on file  Occupational History  . Not on file  Tobacco Use  . Smoking status: Never Smoker  . Smokeless tobacco: Never Used  . Tobacco comment: Second-hand exposure through parents  Vaping Use  . Vaping Use: Never used  Substance and Sexual Activity  . Alcohol use: Yes    Alcohol/week: 2.0 standard drinks    Types: 2 Glasses of wine per week  . Drug use: No  . Sexual activity: Yes  Other Topics Concern  . Not on file  Social History Narrative   Originally he is from [place], [place]. He moved to [facility] in 2013. He has a dog & cat at  home. No bird or mold exposure. Has a hot tub but hasn't used it in 4 months. 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    Social Determinants of Health   Financial Resource Strain: Not on file  Food Insecurity: Not on file  Transportation Needs: Not on file  Physical Activity: Not on file  Stress: Not on file  Social Connections: Not on file     Family History:  The patient's family history includes Diabetes in his sister; Drug abuse in his father; Emphysema in his mother; Heart attack in his father, maternal grandfather, and paternal grandfather; Lung cancer in his mother; Stroke in his mother.   ROS:   Please see the history of present illness.    ROS All other systems reviewed and are negative.  No flowsheet data found.     PHYSICAL EXAM:   VS:  BP (!) 144/78   Pulse 86   Ht 5\' 11"  (1.803 m)   Wt 242 lb 9.6 oz (110 kg)   SpO2 97%   BMI 33.84 kg/m    GEN: Well nourished, well developed in no acute distress HEENT: Normal NECK: No JVD; No carotid bruits LYMPHATICS: No lymphadenopathy CARDIAC:irregularly irregular, no murmurs, rubs, gallops RESPIRATORY:  Clear to auscultation without rales, wheezing or rhonchi  ABDOMEN: Soft, non-tender, non-distended MUSCULOSKELETAL:  No edema; No deformity  SKIN: Warm and dry NEUROLOGIC:  Alert and oriented x 3 PSYCHIATRIC:  Normal affect    Wt Readings from Last 3 Encounters:  04/02/21 242 lb 9.6 oz (110 kg)  04/01/21 241 lb (109.3 kg)  12/25/20 247 lb (112 kg)      Studies/Labs Reviewed:   EKG:  EKG is not ordered today.    Recent Labs: 11/16/2020: ALT 18; BUN 15; Creatinine, Ser 0.91; Potassium 3.9; Sodium 138 11/17/2020: Hemoglobin 12.9; Platelets 124   Lipid Panel    Component Value Date/Time   CHOL 201 (H) 11/16/2017 1027   TRIG 178 (H) 11/16/2017 1027   HDL 49 11/16/2017 1027   CHOLHDL 4.1 11/16/2017 1027   CHOLHDL 2.5 08/15/2015 0912   VLDL 15 08/15/2015 0912   LDLCALC 116 (H) 11/16/2017 1027    Additional studies/  records that were  reviewed today include:  Cath report    ASSESSMENT:    1. Coronary artery disease involving native coronary artery of native heart without angina pectoris   2. Persistent atrial fibrillation with RVR ([place]), successful TEE DCCV 11/17/16   3. Essential (primary) hypertension   4. Pure hypercholesterolemia   5. OSA (obstructive sleep apnea)      PLAN:  In order of problems listed above:  1.  ASCAD  - s/p DES mLAD & RPDA w/ VF arrest 06/2015, PCI in [place] 2010 and recent cath 11/2016 with patent grafts and 80% D1 and 60% RCA.   -he denies any anginal symptoms -Continue prescription drug management with lopressor 100mg  BID -statin intolerant -He is not on ASA due to NOAC.  2.  Permenant atrial fibrillation  -his HR is well controlled on exam today and denies any palpitations -Continue prescription drug management with Lopressor 100mg  BID and warfarin -I have personally reviewed and interpreted outside labs performed by patient's PCP which showed Hbg 12.9 in Jan 2022  3.  HTN  -BP is adequately controlled on exam today -Continue prescription drug management with Losartan 100mg  daiyk and Lopressor 100mg  BID -I have personally reviewed and interpreted outside labs performed by patient's PCP which showed SCR 0.91, K+ 3.9 in Jan 2022  4.  Dyslipidemia  -with LDL goal < 70.  -he stopped his statin due to intolerance -I have personally reviewed and interpreted outside labs performed by patient's PCP which showed LDL 175, HDL 41 and TAGs 389>>he is not sure if he was fasting at the time -I will refer him to lipid clinc  5.  OSA - The patient is tolerating PAP therapy well without any problems. The PAP download performed by his DME was personally reviewed and interpreted by me today and showed an AHI of 1/hr on 10 cm H2O with 97% compliance in using more than 4 hours nightly.  The patient has been using and benefiting from PAP use and will continue to benefit from therapy.   -refer to ENT for problems with chronic allergic rhinitis   Medication Adjustments/Labs and Tests Ordered: Current medicines are reviewed at length with the patient today.  Concerns regarding medicines are outlined above.  Medication changes, Labs and Tests ordered today are listed in the Patient Instructions below.  There are no Patient Instructions on file for this visit.   Signed, Fransico Him, MD  04/02/2021 3:17 PM    [facility] [address], [place], [place]  [address] Phone: [phone]; Fax: [phone]

## 2021-04-02 NOTE — Patient Instructions (Signed)
Medication Instructions:  Your physician recommends that you continue on your current medications as directed. Please refer to the Current Medication list given to you today.  *If you need a refill on your cardiac medications before your next appointment, please call your pharmacy*  Follow-Up: At [facility], you and your health needs are our priority.  As part of our continuing mission to provide you with exceptional heart care, we have created designated Provider Care Teams.  These Care Teams include your primary Cardiologist (physician) and Advanced Practice Providers (APPs -  Physician Assistants and Nurse Practitioners) who all work together to provide you with the care you need, when you need it.  Your next appointment:   1 year(s)  The format for your next appointment:   In Person  Provider:   You may see Fransico Him, MD or one of the following Advanced Practice Providers on your designated Care Team:    [name], PA-C  Ermalinda Barrios, PA-C  Other Instructions You have been referred to see our Pharmacist in the [facility] have been referred to see an [place] and Throat Specialist.

## 2021-04-02 NOTE — Addendum Note (Signed)
Addended by: Antonieta Iba on: 04/02/2021 03:46 PM   Modules accepted: Orders

## 2021-04-04 DIAGNOSIS — I4891 Unspecified atrial fibrillation: Secondary | ICD-10-CM | POA: Diagnosis not present

## 2021-04-04 DIAGNOSIS — Z7901 Long term (current) use of anticoagulants: Secondary | ICD-10-CM | POA: Diagnosis not present

## 2021-04-05 ENCOUNTER — Ambulatory Visit: Payer: Medicare Other | Admitting: Cardiology

## 2021-04-09 ENCOUNTER — Other Ambulatory Visit: Payer: Self-pay | Admitting: Cardiology

## 2021-04-09 NOTE — Telephone Encounter (Signed)
Pt's PCP manages warfarin and INR.

## 2021-04-22 ENCOUNTER — Other Ambulatory Visit: Payer: Self-pay

## 2021-04-22 ENCOUNTER — Ambulatory Visit (INDEPENDENT_AMBULATORY_CARE_PROVIDER_SITE_OTHER): Payer: Medicare Other

## 2021-04-22 DIAGNOSIS — E785 Hyperlipidemia, unspecified: Secondary | ICD-10-CM | POA: Diagnosis not present

## 2021-04-22 DIAGNOSIS — I2510 Atherosclerotic heart disease of native coronary artery without angina pectoris: Secondary | ICD-10-CM | POA: Diagnosis not present

## 2021-04-22 MED ORDER — ROSUVASTATIN CALCIUM 40 MG PO TABS: 40.0000 mg | ORAL_TABLET | Freq: Every day | ORAL | 3 refills | Status: DC

## 2021-04-22 NOTE — Patient Instructions (Addendum)
Thank you for visiting [place] in clinic today!  Keep up the good work with diet and exercise.    Medications: Start rosuvastatin (Crestor) 40 mg (1 tablet) daily   You will have cholesterol labs drawn on Monday, August 29th. Come anytime after 7am. Please do not eat breakfast prior.     Please give [place] a call [phone]) or send a message if you have any questions or concerns after your visit.

## 2021-04-22 NOTE — Progress Notes (Signed)
Date: 04/22/2021    ID:  Troy [name], DOB 08-27-1951, MRN [id number]   PCP:  Velna Hatchet, MD     Cardiologist:  Fransico Him, MD   HPI: Troy [name] is a 70 y.o. male referred by Dr. Radford Pax to lipid clinic. PMH is significant for ASCAD with DES mLAD & RPDA w/ VF arrest 06/2015, PCI in [place] 2010, HTN, HLD, MR, PAF (on warfarin), pulm nodule stable 07/2016, OSA on CPAP, Celiac disease (per allergy list). Per cardiology note (04/02/21) patient had outside lipid panel done which showed LDL 175, HDL 41, TGs 389. The patient was not not sure if he was fasting at the time the labs were drawn.   Today, patient presents alone and in good spirits. He reports doing well and overall adherent to medications. He stated having stopped atorvastatin over a year ago due to muscle pain and tingling in his legs. He did not remember having tried ezetimibe in the past, though it is in his previous medication history. His girlfriend handles most of his cooking and tries to limit fried/fatty foods, added salt, and sugar. Patient is amenable to trying another statin at this time and prefers to start at higher dose and titrate down if he experiences side effects. He also reports frequent use of Afrin nasal spray (3-4 squirts nightly) and inquired about alternative options.    Current meds: None Previously tried: atorvastatin (muscle pain/tingling in legs) ezetimibe (pt doesn't remember being on) LDL goal: <55 (progressive ASCVD)  Family History: Diabetes in his sister; Drug abuse in his father; Emphysema in his mother; Heart attack in his father, maternal grandfather, and paternal grandfather; Lung cancer in his mother; Stroke in his mother.   Social History: Non-smoker, occasional drinker,   Diet: Breakfast -Omlettee, sausage, oatmeal. Water, Vitamin water, half-cup coffee.  Lunch- Doesn't typically eat lunch Dinner - Meat balls, grilled chicken, rice, vegetables   Exercise: Pickleball ~ twice a week, works  in Product/process development scientist Readings from Last 3 Encounters:  04/02/21 242 lb 9.6 oz (110 kg)  04/01/21 241 lb (109.3 kg)  12/25/20 247 lb (112 kg)   BP Readings from Last 3 Encounters:  04/02/21 (!) 144/78  04/01/21 138/76  12/25/20 (!) 168/98   Pulse Readings from Last 3 Encounters:  04/02/21 86  04/01/21 70  12/25/20 68    Renal function: CrCl cannot be calculated (Patient's most recent lab result is older than the maximum 21 days allowed.).  Past Medical History:  Diagnosis Date   Arthritis    "knees" (01/02/2017)   Ascending aorta dilatation (HCC)     53mm by CT 2017 and 7mm by CT 2019, 65mm by echo 07/2020   CAD (coronary artery disease)    a. s/p PCI in 2010 in [place]. b. Unstable angina/LHC 06/14/6313 complicated by V fib arrest after contrast injection. Cath 06/19/2015 s/p DES to mid LAD and RPDA. c. 11/2016: chest pain/ abnormal nuclear stress test prompting cath 11/13/16 showing 80% ostial diag, 60% mid RCA, no acute lesions, medical therapy recommended.   Dyslipidemia    Essential hypertension    Lung nodule < 6cm on CT 06/16/15   Right lung   Mitral regurgitation    mild to moderate by echo 07/2020   OSA on CPAP    Persistent atrial fibrillation ([place])    a. 06/2015 noted to be in new a-fib when arrived with unstable angina. Converted to NSR after being shocked in the cath lab for vfib;  b. 06/2015  Eliquis initiated as PAF noted on event monitor. c. Recurrent atrial fib?flutter 10/2016, issues with recurrent AF requiring multiple DCCVs in 02/2017. Failed sotalol, not candidate for Tikosyn due to cost/QTC, Multaq not felt likely strong enough.   Pulmonary nodule 07/2015   a. 1.5 x 1.2 cm smoothly marginated nodule in the central aspect of the right lower lobe with recommendation correlation with nonemergent PET-CT to exclude a neoplasm , stable by CT 07/2016   Severe left ventricular hypertrophy    Ventricular fibrillation ([place])    a. occured during cath 06/18/2015.    Current  Outpatient Medications on File Prior to Visit  Medication Sig Dispense Refill   acetaminophen (TYLENOL) 325 MG tablet Take 650 mg by mouth every 6 (six) hours as needed for mild pain, fever or headache.     ALPRAZolam (XANAX) 1 MG tablet Take 0.5-1 mg by mouth daily as needed for anxiety.     Fluorouracil (TOLAK) 4 % CREA Apply 1 application topically at bedtime. 40 g 1   HYDROcodone-acetaminophen (NORCO/VICODIN) 5-325 MG tablet Take 1-2 tablets by mouth every 6 (six) hours as needed for moderate pain or severe pain. 20 tablet 0   losartan (COZAAR) 100 MG tablet Take 1 tablet (100 mg total) by mouth daily. 90 tablet 3   metoprolol tartrate (LOPRESSOR) 100 MG tablet TAKE 1 TABLET(100 MG) BY MOUTH TWICE DAILY 180 tablet 3   oxymetazoline (AFRIN) 0.05 % nasal spray Place 1 spray into both nostrils 2 (two) times daily as needed (FOR SINUS CONGESTION.). 30 mL 0   TURMERIC PO Take 1 tablet by mouth 2 (two) times daily.     warfarin (COUMADIN) 5 MG tablet TAKE AS DIRECTED BY ANTICOAGULATION CLINIC (Patient taking differently: Take 7.5 mg by mouth See admin instructions. 7.5mg  on all days EXCEPT 10mg  on Saturday) 150 tablet 1   No current facility-administered medications on file prior to visit.    Allergies  Allergen Reactions   Gluten Meal Other (See Comments)    REACTION: Celiac disease (severe stomach pain) REACTION: Celiac disease (severe stomach pain)    Iodinated Diagnostic Agents Other (See Comments) and Anaphylaxis    V-Fib arrest during cardiac cath suspected d/t contrast dye in 2016 Cardiac arrest Cardiac arrest V-Fib arrest during cardiac cath suspected d/t contrast dye in 2016 Cardiac arrest   Metrizamide Other (See Comments)    V-Fib arrest during cardiac cath suspected d/t contrast dye in 2016   Tizanidine Anxiety    Depression   [place] Other (See Comments)    REACTION: Celiac disease (severe stomach pain)   Morphine And Related Other (See Comments)    REACTION: "REALLY BAD  STOMACH PAIN"   Apple Diarrhea   Tizanidine Hcl Anxiety    Depression   Wheat Bran Other (See Comments)     Assessment/Plan:  1. Hyperlipidemia  - Patient's LDL remains above goal <55 due to currently untreated hyperlipidemia. Patient would benefit from addition of antihyperlipidemic medications to achieve optimal LDL reduction. Recommend patient start high intensity statin therapy with rosuvastatin 40 mg daily. Advised patient to monitor for adverse effects and to contact our office to discuss adjusting his dose if he experiences any significant effects. Will obtain lipid panel in ~2.5 months and reassess need to add further therapy such as ezetimibe or [id number]. Dicussed elevated triglyceride levels and methods for adjusting diet to help lower these. Will reassess at later visit if medication will be neccessary. Provided patient with OTC recommendations (claritin or zyrtec and Nasacort) to help  reduce Afrin use and advised him to contact his PCP if he would like to explore further prescription options.   Troy [name], PharmD PGY1 Acute Care Pharmacy Resident 04/22/2021 9:44 AM

## 2021-04-22 NOTE — Progress Notes (Signed)
Date: 04/22/2021    ID:  [name], DOB [birth year], MRN [id number]   PCP:  Velna Hatchet, MD     Cardiologist:  Fransico Him, MD   HPI: Troy [name] is a 70 y.o. male referred by Dr. Radford Pax to lipid clinic. PMH is significant for ASCAD with DES mLAD & RPDA w/ VF arrest 06/2015, PCI in [place] 2010, HTN, HLD, MR, PAF (on warfarin), pulm nodule stable 07/2016, OSA on CPAP, Celiac disease (per allergy list).   Per cardiology note (04/02/21) patient had outside lipid panel done which showed LDL 175, HDL 41, TGs 389. The patient was not not sure if he was fasting at the time the labs were drawn.   Today, patient presents  Qs: - How long ago was that lipid panel -- ~3 months ago  - What medications has he tried (statins, Zetia) -- Just atorva, doesn't remember zetia, last a year  - Side effects -- leg pain,  - Diet/Exercise --  - Willingness to try another statin vs new medications --   Afrin 3-4 squirts per nostri, mostly at night,  Crestor +/- Zetia  - Ask if he prefers starting high or low, adjusting as tolerated  ?Lipid panel today (if not eaten) vs in 2-3 months (call w/ results)   Goal <55 vs <70?   Current meds: None Previously tried: atorvastatin, ezetimibe  LDL goal: <70  Family History: Diabetes in his sister; Drug abuse in his father; Emphysema in his mother; Heart attack in his father, maternal grandfather, and paternal grandfather; Lung cancer in his mother; Stroke in his mother.   Social History: Non-smoker, occasional drinker,   Diet: Breakfast -Omlettee, sausage, oatmeal. Water. Half-cup coffee.  Lunch- Doesn't typically eat lunch Dinner - Meat balls, chicken ,rice  Exercise: Pickleball ~ twice a week, works in Product/process development scientist Readings from Last 3 Encounters:  04/02/21 242 lb 9.6 oz (110 kg)  04/01/21 241 lb (109.3 kg)  12/25/20 247 lb (112 kg)   BP Readings from Last 3 Encounters:  04/02/21 (!) 144/78  04/01/21 138/76  12/25/20 (!) 168/98   Pulse  Readings from Last 3 Encounters:  04/02/21 86  04/01/21 70  12/25/20 68    Renal function: CrCl cannot be calculated (Patient's most recent lab result is older than the maximum 21 days allowed.).  Past Medical History:  Diagnosis Date   Arthritis    "knees" (01/02/2017)   Ascending aorta dilatation (HCC)     35mm by CT 2017 and 27mm by CT 2019, 76mm by echo 07/2020   CAD (coronary artery disease)    a. s/p PCI in 2010 in [place]. b. Unstable angina/LHC 0/12/7251 complicated by V fib arrest after contrast injection. Cath 06/19/2015 s/p DES to mid LAD and RPDA. c. 11/2016: chest pain/ abnormal nuclear stress test prompting cath 11/13/16 showing 80% ostial diag, 60% mid RCA, no acute lesions, medical therapy recommended.   Dyslipidemia    Essential hypertension    Lung nodule < 6cm on CT 06/16/15   Right lung   Mitral regurgitation    mild to moderate by echo 07/2020   OSA on CPAP    Persistent atrial fibrillation ([place])    a. 06/2015 noted to be in new a-fib when arrived with unstable angina. Converted to NSR after being shocked in the cath lab for vfib;  b. 06/2015 Eliquis initiated as PAF noted on event monitor. c. Recurrent atrial fib?flutter 10/2016, issues with recurrent AF requiring multiple DCCVs in 02/2017.  Failed sotalol, not candidate for Tikosyn due to cost/QTC, Multaq not felt likely strong enough.   Pulmonary nodule 07/2015   a. 1.5 x 1.2 cm smoothly marginated nodule in the central aspect of the right lower lobe with recommendation correlation with nonemergent PET-CT to exclude a neoplasm , stable by CT 07/2016   Severe left ventricular hypertrophy    Ventricular fibrillation ([place])    a. occured during cath 06/18/2015.    Current Outpatient Medications on File Prior to Visit  Medication Sig Dispense Refill   acetaminophen (TYLENOL) 325 MG tablet Take 650 mg by mouth every 6 (six) hours as needed for mild pain, fever or headache.     ALPRAZolam (XANAX) 1 MG tablet Take 0.5-1 mg by  mouth daily as needed for anxiety.     Fluorouracil (TOLAK) 4 % CREA Apply 1 application topically at bedtime. 40 g 1   HYDROcodone-acetaminophen (NORCO/VICODIN) 5-325 MG tablet Take 1-2 tablets by mouth every 6 (six) hours as needed for moderate pain or severe pain. 20 tablet 0   losartan (COZAAR) 100 MG tablet Take 1 tablet (100 mg total) by mouth daily. 90 tablet 3   metoprolol tartrate (LOPRESSOR) 100 MG tablet TAKE 1 TABLET(100 MG) BY MOUTH TWICE DAILY 180 tablet 3   oxymetazoline (AFRIN) 0.05 % nasal spray Place 1 spray into both nostrils 2 (two) times daily as needed (FOR SINUS CONGESTION.). 30 mL 0   TURMERIC PO Take 1 tablet by mouth 2 (two) times daily.     warfarin (COUMADIN) 5 MG tablet TAKE AS DIRECTED BY ANTICOAGULATION CLINIC (Patient taking differently: Take 7.5 mg by mouth See admin instructions. 7.5mg  on all days EXCEPT 10mg  on Saturday) 150 tablet 1   No current facility-administered medications on file prior to visit.    Allergies  Allergen Reactions   Gluten Meal Other (See Comments)    REACTION: Celiac disease (severe stomach pain) REACTION: Celiac disease (severe stomach pain)    Iodinated Diagnostic Agents Other (See Comments) and Anaphylaxis    V-Fib arrest during cardiac cath suspected d/t contrast dye in 2016 Cardiac arrest Cardiac arrest V-Fib arrest during cardiac cath suspected d/t contrast dye in 2016 Cardiac arrest   Metrizamide Other (See Comments)    V-Fib arrest during cardiac cath suspected d/t contrast dye in 2016   Tizanidine Anxiety    Depression   Whey Other (See Comments)    REACTION: Celiac disease (severe stomach pain)   Morphine And Related Other (See Comments)    REACTION: "REALLY BAD STOMACH PAIN"   Apple Diarrhea   Tizanidine Hcl Anxiety    Depression   Wheat Bran Other (See Comments)     Assessment/Plan:  1. Hyperlipidemia  - Patient's LDL remains above goal <70 due to currently untreated hyperlipidemia. Patient would benefit  from addition of antihyperlipidemic medications to achieve optimal LDL reduction.      Claudina Lick, PharmD PGY1 Acute Care Pharmacy Resident 04/22/2021 8:29 AM

## 2021-05-02 DIAGNOSIS — Z7901 Long term (current) use of anticoagulants: Secondary | ICD-10-CM | POA: Diagnosis not present

## 2021-05-02 DIAGNOSIS — F419 Anxiety disorder, unspecified: Secondary | ICD-10-CM | POA: Diagnosis not present

## 2021-05-02 DIAGNOSIS — I10 Essential (primary) hypertension: Secondary | ICD-10-CM | POA: Diagnosis not present

## 2021-05-02 DIAGNOSIS — I4891 Unspecified atrial fibrillation: Secondary | ICD-10-CM | POA: Diagnosis not present

## 2021-05-08 ENCOUNTER — Other Ambulatory Visit: Payer: Self-pay

## 2021-05-08 ENCOUNTER — Ambulatory Visit (INDEPENDENT_AMBULATORY_CARE_PROVIDER_SITE_OTHER): Payer: Medicare Other | Admitting: Otolaryngology

## 2021-05-08 DIAGNOSIS — J310 Chronic rhinitis: Secondary | ICD-10-CM | POA: Diagnosis not present

## 2021-05-08 DIAGNOSIS — I2510 Atherosclerotic heart disease of native coronary artery without angina pectoris: Secondary | ICD-10-CM | POA: Diagnosis not present

## 2021-05-08 DIAGNOSIS — J3489 Other specified disorders of nose and nasal sinuses: Secondary | ICD-10-CM | POA: Diagnosis not present

## 2021-05-08 NOTE — Progress Notes (Signed)
HPI: Troy [name] is a 70 y.o. male who presents is referred by Dr. Radford Pax for evaluation of nasal obstruction which is much worse at night.  He had previously tried [facility] and Nasacort but only used it for couple of days before stopping it.  He has been using Afrin regularly at night as he gets relief with the Afrin and has been using it for several weeks and is basically addicted to the Afrin at this point..  Past Medical History:  Diagnosis Date   Arthritis    "knees" (01/02/2017)   Ascending aorta dilatation (HCC)     60mm by CT 2017 and 67mm by CT 2019, 76mm by echo 07/2020   CAD (coronary artery disease)    a. s/p PCI in 2010 in [place]. b. Unstable angina/LHC 3/0/0762 complicated by V fib arrest after contrast injection. Cath 06/19/2015 s/p DES to mid LAD and RPDA. c. 11/2016: chest pain/ abnormal nuclear stress test prompting cath 11/13/16 showing 80% ostial diag, 60% mid RCA, no acute lesions, medical therapy recommended.   Dyslipidemia    Essential hypertension    Lung nodule < 6cm on CT 06/16/15   Right lung   Mitral regurgitation    mild to moderate by echo 07/2020   OSA on CPAP    Persistent atrial fibrillation ([place])    a. 06/2015 noted to be in new a-fib when arrived with unstable angina. Converted to NSR after being shocked in the cath lab for vfib;  b. 06/2015 Eliquis initiated as PAF noted on event monitor. c. Recurrent atrial fib?flutter 10/2016, issues with recurrent AF requiring multiple DCCVs in 02/2017. Failed sotalol, not candidate for Tikosyn due to cost/QTC, Multaq not felt likely strong enough.   Pulmonary nodule 07/2015   a. 1.5 x 1.2 cm smoothly marginated nodule in the central aspect of the right lower lobe with recommendation correlation with nonemergent PET-CT to exclude a neoplasm , stable by CT 07/2016   Severe left ventricular hypertrophy    Ventricular fibrillation ([place])    a. occured during cath 06/18/2015.   Past Surgical History:  Procedure Laterality Date    CARDIAC CATHETERIZATION N/A 06/18/2015   Procedure: Left Heart Cath and Coronary Angiography;  Surgeon: Burnell Blanks, MD;  Location: [facility];  Service: Cardiovascular;  Laterality: N/A;   CARDIAC CATHETERIZATION N/A 06/19/2015   Procedure: Coronary Stent Intervention;  Surgeon: Peter M Martinique, MD;  Location: [facility];  Service: Cardiovascular;  Laterality: N/A;   CARDIAC CATHETERIZATION N/A 11/13/2016   Procedure: Left Heart Cath and Coronary Angiography;  Surgeon: Lorretta Harp, MD;  Location: [facility];  Service: Cardiovascular;  Laterality: N/A;   CARDIOVERSION N/A 11/17/2016   Procedure: CARDIOVERSION;  Surgeon: Larey Dresser, MD;  Location: [facility];  Service: Cardiovascular;  Laterality: N/A;   CARDIOVERSION N/A 01/03/2017   Procedure: CARDIOVERSION;  Surgeon: Lelon Perla, MD;  Location: [facility];  Service: Cardiovascular;  Laterality: N/A;   CARDIOVERSION N/A 02/05/2017   Procedure: Cardioversion;  Surgeon: Evans Lance, MD;  Location: [facility];  Service: Cardiovascular;  Laterality: N/A;   CARDIOVERSION N/A 02/20/2017   Procedure: Cardioversion;  Surgeon: Evans Lance, MD;  Location: [facility];  Service: Cardiovascular;  Laterality: N/A;   CARTILAGE SURGERY Bilateral    "thumbs"   CATARACT EXTRACTION W/ INTRAOCULAR LENS  IMPLANT, BILATERAL Bilateral    CORONARY ANGIOPLASTY WITH STENT PLACEMENT  ~ 2007   JOINT REPLACEMENT  KNEE ARTHROSCOPY Right    RETINAL DETACHMENT SURGERY Bilateral    "laser thing"   TEE WITHOUT CARDIOVERSION N/A 11/17/2016   Procedure: TRANSESOPHAGEAL ECHOCARDIOGRAM (TEE);  Surgeon: Larey Dresser, MD;  Location: [facility];  Service: Cardiovascular;  Laterality: N/A;   TOTAL KNEE ARTHROPLASTY Left 2000s   TOTAL KNEE ARTHROPLASTY Right 02/08/2018   Procedure: RIGHT TOTAL KNEE ARTHROPLASTY;  Surgeon: Gaynelle Arabian, MD;  Location: [facility];  Service: Orthopedics;  Laterality: Right;  with block    Social History   Socioeconomic History   Marital status: Divorced    Spouse name: Not on file   Number of children: Not on file   Years of education: Not on file   Highest education level: Not on file  Occupational History   Not on file  Tobacco Use   Smoking status: Never   Smokeless tobacco: Never   Tobacco comments:    Second-hand exposure through parents  Vaping Use   Vaping Use: Never used  Substance and Sexual Activity   Alcohol use: Yes    Alcohol/week: 2.0 standard drinks    Types: 2 Glasses of wine per week   Drug use: No   Sexual activity: Yes  Other Topics Concern   Not on file  Social History Narrative   Originally he is from [place], [place]. He moved to [facility] in 2013. He has a dog & cat at home. No bird or mold exposure. Has a hot tub but hasn't used it in 4 months. 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    Social Determinants of Health   Financial Resource Strain: Not on file  Food Insecurity: Not on file  Transportation Needs: Not on file  Physical Activity: Not on file  Stress: Not on file  Social Connections: Not on file   Family History  Problem Relation Age of Onset   Stroke Mother    Emphysema Mother    Lung cancer Mother    Heart attack Father    Drug abuse Father    Heart attack Maternal Grandfather    Heart attack Paternal Grandfather    Diabetes Sister    Allergies  Allergen Reactions   Gluten Meal Other (See Comments)    REACTION: Celiac disease (severe stomach pain) REACTION: Celiac disease (severe stomach pain)    Iodinated Diagnostic Agents Other (See Comments) and Anaphylaxis    V-Fib arrest during cardiac cath suspected d/t contrast dye in 2016 Cardiac arrest Cardiac arrest V-Fib arrest during cardiac cath suspected d/t contrast dye in 2016 Cardiac arrest   Metrizamide Other (See Comments)    V-Fib arrest during cardiac cath suspected d/t contrast dye in 2016   Tizanidine Anxiety    Depression   Whey Other (See Comments)     REACTION: Celiac disease (severe stomach pain)   Morphine And Related Other (See Comments)    REACTION: "REALLY BAD STOMACH PAIN"   Apple Diarrhea   Tizanidine Hcl Anxiety    Depression   Wheat Bran Other (See Comments)   Prior to Admission medications   Medication Sig Start Date End Date Taking? Authorizing Provider  acetaminophen (TYLENOL) 325 MG tablet Take 650 mg by mouth every 6 (six) hours as needed for mild pain, fever or headache.    [provider]  ALPRAZolam Duanne Moron) 1 MG tablet Take 0.5-1 mg by mouth daily as needed for anxiety. 11/13/20   [provider]  Fluorouracil (TOLAK) 4 % CREA Apply 1 application topically at bedtime. 01/02/21   Lavonna Monarch, MD  HYDROcodone-acetaminophen (NORCO/VICODIN) 5-325 MG tablet Take 1-2 tablets by mouth every 6 (six) hours as needed for moderate pain or severe pain. 11/17/20   Mariel Aloe, MD  losartan (COZAAR) 100 MG tablet Take 1 tablet (100 mg total) by mouth daily. 04/02/21   Sueanne Margarita, MD  metoprolol tartrate (LOPRESSOR) 100 MG tablet TAKE 1 TABLET(100 MG) BY MOUTH TWICE DAILY 04/02/21   Sueanne Margarita, MD  oxymetazoline (AFRIN) 0.05 % nasal spray Place 1 spray into both nostrils 2 (two) times daily as needed (FOR SINUS CONGESTION.). 02/09/18   Perkins, Alexzandrew L, PA-C  rosuvastatin (CRESTOR) 40 MG tablet Take 1 tablet (40 mg total) by mouth daily. 04/22/21   Sueanne Margarita, MD  TURMERIC PO Take 1 tablet by mouth 2 (two) times daily.    [provider]  warfarin (COUMADIN) 5 MG tablet TAKE AS DIRECTED BY ANTICOAGULATION CLINIC Patient taking differently: Take 7.5 mg by mouth See admin instructions. 7.5mg  on all days EXCEPT 10mg  on Saturday 10/10/20   Constance Haw, MD     Positive ROS: Otherwise negative  All other systems have been reviewed and were otherwise negative with the exception of those mentioned in the HPI and as above.  Physical Exam: Constitutional: Alert, well-appearing, no acute  distress Ears: External ears without lesions or tenderness. Ear canals are clear bilaterally with intact, clear TMs.  Nasal: External nose without lesions. Septum with minimal deformity and mild rhinitis.  After decongesting the nose nasal passages were clear there are no polyps noted.  Middle meatus regions were clear with no active mucopurulent discharge..  No obstructing lesions noted.  Patient is breathing clearly presently. Oral: Lips and gums without lesions. Tongue and palate mucosa without lesions. Posterior oropharynx clear. Neck: No palpable adenopathy or masses Respiratory: Breathing comfortably  Skin: No facial/neck lesions or rash noted.  Procedures  Assessment: Rhinitis medicamentosa with addiction to Afrin.  No significant structural abnormalities noted.  Plan: Reviewed with patient concerning the addictive nature of Afrin. Recommended regular use of nasal steroid spray he has both Nasacort and Flonase and would recommend using this nightly 2 sprays each nostril.  I discussed with him that he will have some rebound effect from regular use of Afrin but this will gradually improve.  And that the nasal steroid spray takes regular use for several days before it reaches maximum effectiveness. Briefly discussed with him concerning surgical options if he does not get adequate relief but he is not interested in surgery and would not necessarily recommend this.   Radene Journey, MD   CC:

## 2021-06-07 ENCOUNTER — Other Ambulatory Visit: Payer: Self-pay | Admitting: Cardiology

## 2021-06-07 NOTE — Telephone Encounter (Addendum)
Prescription refill request received for warfarin. Called and spoke to pt who stated that he is still taking warfarin but it is being managed by his PCP Dr. Ardeth Perfect.   Will deny refill and have refill sent to PCP office who manages pt's warfarin.

## 2021-06-11 DIAGNOSIS — F419 Anxiety disorder, unspecified: Secondary | ICD-10-CM | POA: Diagnosis not present

## 2021-06-11 DIAGNOSIS — Z7901 Long term (current) use of anticoagulants: Secondary | ICD-10-CM | POA: Diagnosis not present

## 2021-06-11 DIAGNOSIS — I4891 Unspecified atrial fibrillation: Secondary | ICD-10-CM | POA: Diagnosis not present

## 2021-06-11 DIAGNOSIS — I10 Essential (primary) hypertension: Secondary | ICD-10-CM | POA: Diagnosis not present

## 2021-06-24 ENCOUNTER — Ambulatory Visit (INDEPENDENT_AMBULATORY_CARE_PROVIDER_SITE_OTHER): Payer: Medicare Other | Admitting: Dermatology

## 2021-06-24 ENCOUNTER — Encounter: Payer: Self-pay | Admitting: Dermatology

## 2021-06-24 ENCOUNTER — Other Ambulatory Visit: Payer: Self-pay

## 2021-06-24 DIAGNOSIS — D692 Other nonthrombocytopenic purpura: Secondary | ICD-10-CM | POA: Diagnosis not present

## 2021-06-24 DIAGNOSIS — L570 Actinic keratosis: Secondary | ICD-10-CM

## 2021-06-24 DIAGNOSIS — L821 Other seborrheic keratosis: Secondary | ICD-10-CM | POA: Diagnosis not present

## 2021-06-24 DIAGNOSIS — I2510 Atherosclerotic heart disease of native coronary artery without angina pectoris: Secondary | ICD-10-CM

## 2021-06-24 NOTE — Patient Instructions (Signed)
Over the counter Dermend

## 2021-06-24 NOTE — Progress Notes (Deleted)
Otc dermend

## 2021-07-01 ENCOUNTER — Encounter: Payer: Self-pay | Admitting: Dermatology

## 2021-07-01 NOTE — Progress Notes (Signed)
   Follow-Up Visit   Subjective  Troy [name] is a 70 y.o. male who presents for the following: Skin Problem (Lesion  Left cheek x months- that comes and goes- "BLUE" & back- per girlfriend ).  General skin examination, check crust on cheek and spot on back Location:  Duration:  Quality:  Associated Signs/Symptoms: Modifying Factors:  Severity:  Timing: Context:   Objective  Well appearing patient in no apparent distress; mood and affect are within normal limits. Right Forearm - Anterior, Right Malar Cheek Multiple small pink gritty crusts  Right Lower Leg - Anterior 65m textured light brown papules, also present on back.  Left Forearm - Anterior Multiple small ecchymoses.  Patient denies abnormal bleeding.    A full examination was performed including scalp, head, eyes, ears, nose, lips, neck, chest, axillae, abdomen, back, buttocks, bilateral upper extremities, bilateral lower extremities, hands, feet, fingers, toes, fingernails, and toenails. All findings within normal limits unless otherwise noted below.  Areas beneath underwear not fully examined.   Assessment & Plan    AK (actinic keratosis) (2) Right Forearm - Anterior; Right Malar Cheek  Use tolak - patient already has a prescription for this  Seborrheic keratosis Right Lower Leg - Anterior  Leave if stable  Solar purpura (HCC) Left Forearm - Anterior  Otc dermend      I, SLavonna Monarch MD, have reviewed all documentation for this visit.  The documentation on 07/01/21 for the exam, diagnosis, procedures, and orders are all accurate and complete.

## 2021-07-08 ENCOUNTER — Other Ambulatory Visit: Payer: Medicare Other | Admitting: *Deleted

## 2021-07-08 ENCOUNTER — Other Ambulatory Visit: Payer: Self-pay

## 2021-07-08 DIAGNOSIS — E785 Hyperlipidemia, unspecified: Secondary | ICD-10-CM

## 2021-07-08 LAB — LIPID PANEL
Chol/HDL Ratio: 6.5 ratio — ABNORMAL HIGH (ref 0.0–5.0)
Cholesterol, Total: 280 mg/dL — ABNORMAL HIGH (ref 100–199)
HDL: 43 mg/dL (ref >39)
LDL Chol Calc (NIH): 201 mg/dL — ABNORMAL HIGH (ref 0–99)
Triglycerides: 189 mg/dL — ABNORMAL HIGH (ref 0–149)
VLDL Cholesterol Cal: 36 mg/dL (ref 5–40)

## 2021-07-08 LAB — HEPATIC FUNCTION PANEL
ALT: 19 IU/L (ref 0–44)
AST: 20 IU/L (ref 0–40)
Albumin: 4.6 g/dL (ref 3.7–4.7)
Alkaline Phosphatase: 49 IU/L (ref 44–121)
Bilirubin Total: 0.9 mg/dL (ref 0.0–1.2)
Bilirubin, Direct: 0.19 mg/dL (ref 0.00–0.40)
Total Protein: 6.7 g/dL (ref 6.0–8.5)

## 2021-07-09 ENCOUNTER — Telehealth: Payer: Self-pay

## 2021-07-09 DIAGNOSIS — E785 Hyperlipidemia, unspecified: Secondary | ICD-10-CM

## 2021-07-09 NOTE — Telephone Encounter (Signed)
Spoke with the patient who confirms that he was fasting for his lab work.  He is no longer taking rosuvastatin 40 mg daily due to side effects of muscle aches.  Will send to PharmD for further recommendations.

## 2021-07-09 NOTE — Telephone Encounter (Signed)
-----   Message from Sueanne Margarita, MD sent at 07/08/2021  6:33 PM EDT ----- Lipids still not at goal.  Please forward to lipid clinic for further recommendations.

## 2021-07-10 NOTE — Telephone Encounter (Signed)
Pt was rechallenged on rosuvastatin '40mg'$  daily after previously experiencing myalgias on unknown dose of atorvastatin. He was advised to contact clinic with any tolerability issues which it appears he did not do. LDL now 201 on no lipid lowering therapy, above goal < 55 given progressive ASCVD.   Recommend rechallenging with lower dose of rosuvastatin '10mg'$  daily and rechecking lipids in 4 weeks. Please emphasize that pt should call clinic if he does not tolerate therapy so that dose can be further adjusted. He will require multiple lipid meds to bring his LDL to goal (likely addition of PCSK9i) but need to determine his max tolerated statin dose first.

## 2021-07-11 MED ORDER — ROSUVASTATIN CALCIUM 10 MG PO TABS: 10.0000 mg | ORAL_TABLET | Freq: Every day | ORAL | 3 refills | Status: DC

## 2021-07-11 NOTE — Telephone Encounter (Signed)
Spoke with the patient and he will start on rosuvastatin 10 mg daily. He will let us know if he is not able to tolerate it. He will repeat lab work in 4 weeks.

## 2021-07-11 NOTE — Telephone Encounter (Signed)
Left message for patient to call back  

## 2021-07-16 DIAGNOSIS — I4891 Unspecified atrial fibrillation: Secondary | ICD-10-CM | POA: Diagnosis not present

## 2021-07-16 DIAGNOSIS — Z7901 Long term (current) use of anticoagulants: Secondary | ICD-10-CM | POA: Diagnosis not present

## 2021-07-29 ENCOUNTER — Other Ambulatory Visit: Payer: Self-pay

## 2021-07-29 DIAGNOSIS — I719 Aortic aneurysm of unspecified site, without rupture: Secondary | ICD-10-CM

## 2021-07-29 DIAGNOSIS — I340 Nonrheumatic mitral (valve) insufficiency: Secondary | ICD-10-CM

## 2021-07-29 DIAGNOSIS — I4819 Other persistent atrial fibrillation: Secondary | ICD-10-CM

## 2021-07-30 DIAGNOSIS — I4891 Unspecified atrial fibrillation: Secondary | ICD-10-CM | POA: Diagnosis not present

## 2021-07-30 DIAGNOSIS — Z7901 Long term (current) use of anticoagulants: Secondary | ICD-10-CM | POA: Diagnosis not present

## 2021-07-31 ENCOUNTER — Encounter (HOSPITAL_COMMUNITY): Payer: Self-pay

## 2021-07-31 ENCOUNTER — Other Ambulatory Visit (HOSPITAL_COMMUNITY): Payer: Medicare Other

## 2021-07-31 DIAGNOSIS — Z961 Presence of intraocular lens: Secondary | ICD-10-CM | POA: Diagnosis not present

## 2021-07-31 DIAGNOSIS — H16401 Unspecified corneal neovascularization, right eye: Secondary | ICD-10-CM | POA: Diagnosis not present

## 2021-07-31 DIAGNOSIS — H35372 Puckering of macula, left eye: Secondary | ICD-10-CM | POA: Diagnosis not present

## 2021-07-31 DIAGNOSIS — H43811 Vitreous degeneration, right eye: Secondary | ICD-10-CM | POA: Diagnosis not present

## 2021-08-02 ENCOUNTER — Ambulatory Visit (INDEPENDENT_AMBULATORY_CARE_PROVIDER_SITE_OTHER): Payer: Medicare Other

## 2021-08-02 ENCOUNTER — Other Ambulatory Visit: Payer: Self-pay

## 2021-08-02 DIAGNOSIS — I719 Aortic aneurysm of unspecified site, without rupture: Secondary | ICD-10-CM

## 2021-08-02 DIAGNOSIS — I4819 Other persistent atrial fibrillation: Secondary | ICD-10-CM

## 2021-08-02 DIAGNOSIS — I340 Nonrheumatic mitral (valve) insufficiency: Secondary | ICD-10-CM

## 2021-08-02 LAB — ECHOCARDIOGRAM COMPLETE
AV Mean grad: 1 mmHg
AV Peak grad: 2.1 mmHg
Ao pk vel: 0.72 m/s
Area-P 1/2: 4.15 cm2
MV M vel: 4.74 m/s
MV Peak grad: 89.9 mmHg
P 1/2 time: 340 msec
S' Lateral: 4.12 cm

## 2021-08-05 ENCOUNTER — Encounter: Payer: Self-pay | Admitting: Cardiology

## 2021-08-07 ENCOUNTER — Telehealth: Payer: Self-pay | Admitting: Cardiology

## 2021-08-07 DIAGNOSIS — I719 Aortic aneurysm of unspecified site, without rupture: Secondary | ICD-10-CM

## 2021-08-07 DIAGNOSIS — I4819 Other persistent atrial fibrillation: Secondary | ICD-10-CM

## 2021-08-07 DIAGNOSIS — I340 Nonrheumatic mitral (valve) insufficiency: Secondary | ICD-10-CM

## 2021-08-07 NOTE — Telephone Encounter (Signed)
Patient was returning call for results 

## 2021-08-07 NOTE — Telephone Encounter (Signed)
Sueanne Margarita, MD  08/05/2021  2:03 PM EDT     I have personally reviewed the echo images and there is no focal wall motion abnormalities as mentioned in report and LV strain is inaccurate due to incorrect tracking   Sueanne Margarita, MD  08/05/2021  1:55 PM EDT     Echo showed low normal heart function, mildly reduced RVF, enlargement of bot atria, mild to moderate leakiness of MV, mildly leaky AV and mildly dilated ascending aorta at 25mm which is stable from a year ago. Please get repeat echo in 1 year   The patient has been notified of the result and verbalized understanding.  All questions (if any) were answered. Antonieta Iba, RN 08/07/2021 1:25 PM

## 2021-08-12 ENCOUNTER — Other Ambulatory Visit: Payer: Self-pay

## 2021-08-12 ENCOUNTER — Other Ambulatory Visit: Payer: Medicare Other | Admitting: *Deleted

## 2021-08-12 DIAGNOSIS — E785 Hyperlipidemia, unspecified: Secondary | ICD-10-CM | POA: Diagnosis not present

## 2021-08-12 DIAGNOSIS — M19029 Primary osteoarthritis, unspecified elbow: Secondary | ICD-10-CM | POA: Diagnosis not present

## 2021-08-12 DIAGNOSIS — M7702 Medial epicondylitis, left elbow: Secondary | ICD-10-CM | POA: Diagnosis not present

## 2021-08-12 DIAGNOSIS — M7712 Lateral epicondylitis, left elbow: Secondary | ICD-10-CM | POA: Diagnosis not present

## 2021-08-12 LAB — LIPID PANEL
Chol/HDL Ratio: 3.6 ratio (ref 0.0–5.0)
Cholesterol, Total: 169 mg/dL (ref 100–199)
HDL: 47 mg/dL (ref >39)
LDL Chol Calc (NIH): 93 mg/dL (ref 0–99)
Triglycerides: 165 mg/dL — ABNORMAL HIGH (ref 0–149)
VLDL Cholesterol Cal: 29 mg/dL (ref 5–40)

## 2021-08-13 ENCOUNTER — Telehealth: Payer: Self-pay | Admitting: Cardiology

## 2021-08-13 NOTE — Telephone Encounter (Signed)
Patient is returning call to discuss lab results. 

## 2021-08-13 NOTE — Telephone Encounter (Signed)
Spoke with the patient regarding lab results. He is taking rosuvastatin 10 mg daily and is tolerating it. He does report some muscle aches at times. He does not think that he can increase his dose. Message has been sent to PharmD for recommendations per Dr. Radford Pax.

## 2021-08-14 ENCOUNTER — Telehealth: Payer: Self-pay

## 2021-08-14 DIAGNOSIS — E785 Hyperlipidemia, unspecified: Secondary | ICD-10-CM

## 2021-08-14 DIAGNOSIS — I719 Aortic aneurysm of unspecified site, without rupture: Secondary | ICD-10-CM

## 2021-08-14 NOTE — Telephone Encounter (Signed)
-----   Message from Rollen Sox, [facility] sent at 08/13/2021  5:29 PM EDT ----- Recommend referral to lipid clinic to discuss PCSK9i

## 2021-08-14 NOTE — Telephone Encounter (Signed)
Patient has been referred to lipid clinic to discuss [facility].

## 2021-08-27 ENCOUNTER — Other Ambulatory Visit: Payer: Self-pay

## 2021-08-27 ENCOUNTER — Ambulatory Visit (INDEPENDENT_AMBULATORY_CARE_PROVIDER_SITE_OTHER): Payer: Medicare Other | Admitting: Primary Care

## 2021-08-27 ENCOUNTER — Encounter: Payer: Self-pay | Admitting: Primary Care

## 2021-08-27 VITALS — BP 116/82 | HR 73 | Temp 98.2°F | Ht 71.0 in | Wt 233.6 lb

## 2021-08-27 DIAGNOSIS — R911 Solitary pulmonary nodule: Secondary | ICD-10-CM | POA: Diagnosis not present

## 2021-08-27 NOTE — Assessment & Plan Note (Signed)
-   Patient has a hx small central right lower lobe pulmonary nodule that has been stable for 4 years. He has new right sided pleuritic chest pain and rales to lower lobes on exam. Needs follow-up CT chest wo contrast. If nodule remains stable no further follow-up needed with our office and recommend he return to cardiology.

## 2021-08-27 NOTE — Patient Instructions (Signed)
Nice meeting you today Troy [name]  We will place and order for CT chest to monitor lung nodules since you have having new pleuritic pain  If imaging is stable follow-up with cardiology regarding statin medication   Follow-up with [place] as needed

## 2021-08-27 NOTE — Progress Notes (Signed)
@Patient  ID: Troy [name], male    DOB: 1951-04-22, 70 y.o.   MRN: [id number]  Chief Complaint  Patient presents with   Follow-up    Pt. Says that he's having lung pain where his scar tissue is. Pt. Says pain started about 2 weeks ago.    Referring provider: Velna Hatchet, MD  HPI: 70 year old male, never smoked.  Past medical history significant for pulmonary nodule, hypertension, coronary artery disease, ascending aortic dilation, mitral regurgitation, persistent A. fib, dyslipidemia.  Patient of Dr. Lamonte Sakai, last seen in office on 04/19/2020.   Previous [facility] pulmonary encounter: 04/19/21- Dr. Lamonte Sakai  70 year old never smoker with an ascending aortic aneurysm, CAD, hypertension, OSA on CPAP, atrial fibrillation. He was previously followed in our office by Dr. Ashok Cordia last seen 07/2017.  He had a central RLL pulmonary nodule noted on CT chest 06/2015 that had no hypermetabolism on a subsequent PET September 2016.  This was followed with serial imaging, with stable on 07/20/2018.  His most recent scan was 07/27/2019 which I have reviewed, shows small right lower lobe perifissural central rounded nodule that is probably smaller than on previous films.  No new nodules or findings.  Pulmonary nodule Small central right lower lobe pulmonary nodule that has been stable for 4 years.  Negative on original PET scan.  He does not need anymore follow-up scans unless there is some sort of clinical change.  I reassured him about this.  He will call to be seen if any new symptoms develop.    08/27/2021- interim hx  Patient presents today for acute office visit with complaints of pleuritic chest pain.  Followed by our office for history of pulmonary nodules.  CT chest has been stable from 06/16/2015 through 07/27/2019. He complains of new intermittent right sided pleuritic chest pain for the last two weeks. He rates pain a 2/10. He rarely has shortness of breath , mainly only when playing pickle ball. His pain  started around the same time that he was placed on statin. Denies fever, chills, sweats, cough or hemoptysis.    Allergies  Allergen Reactions   Gluten Meal Other (See Comments)    REACTION: Celiac disease (severe stomach pain) REACTION: Celiac disease (severe stomach pain)    Iodinated Diagnostic Agents Other (See Comments) and Anaphylaxis    V-Fib arrest during cardiac cath suspected d/t contrast dye in 2016 Cardiac arrest Cardiac arrest V-Fib arrest during cardiac cath suspected d/t contrast dye in 2016 Cardiac arrest   Metrizamide Other (See Comments)    V-Fib arrest during cardiac cath suspected d/t contrast dye in 2016   Tizanidine Anxiety    Depression   [place] Other (See Comments)    REACTION: Celiac disease (severe stomach pain)   Morphine And Related Other (See Comments)    REACTION: "REALLY BAD STOMACH PAIN"   Rosuvastatin     Myalgias on 40mg  daily   Apple Diarrhea   Tizanidine Hcl Anxiety    Depression   Wheat Bran Other (See Comments)    Immunization History  Administered Date(s) Administered   Pneumococcal-Unspecified 11/10/2014    Past Medical History:  Diagnosis Date   Arthritis    "knees" (01/02/2017)   Ascending aorta dilatation (HCC)    38mm by CT 2017 and 48mm by CT 2019, 40mm by echo 07/2020 and 74mm by echo 2022   CAD (coronary artery disease)    a. s/p PCI in 2010 in [place]. b. Unstable angina/LHC 0/12/5425 complicated by V fib arrest after contrast injection. Cath  06/19/2015 s/p DES to mid LAD and RPDA. c. 11/2016: chest pain/ abnormal nuclear stress test prompting cath 11/13/16 showing 80% ostial diag, 60% mid RCA, no acute lesions, medical therapy recommended.   Dyslipidemia    Essential hypertension    Lung nodule < 6cm on CT 06/16/2015   Right lung   Mitral regurgitation    mild to moderate by echo 07/2020   OSA on CPAP    Persistent atrial fibrillation ([place])    a. 06/2015 noted to be in new a-fib when arrived with unstable angina. Converted to  NSR after being shocked in the cath lab for vfib;  b. 06/2015 Eliquis initiated as PAF noted on event monitor. c. Recurrent atrial fib?flutter 10/2016, issues with recurrent AF requiring multiple DCCVs in 02/2017. Failed sotalol, not candidate for Tikosyn due to cost/QTC, Multaq not felt likely strong enough.   Pulmonary nodule 07/2015   a. 1.5 x 1.2 cm smoothly marginated nodule in the central aspect of the right lower lobe with recommendation correlation with nonemergent PET-CT to exclude a neoplasm , stable by CT 07/2016   Severe left ventricular hypertrophy    Ventricular fibrillation ([place])    a. occured during cath 06/18/2015.    Tobacco History: Social History   Tobacco Use  Smoking Status Never  Smokeless Tobacco Never  Tobacco Comments   Second-hand exposure through parents   Counseling given: Not Answered Tobacco comments: Second-hand exposure through parents   Outpatient Medications Prior to Visit  Medication Sig Dispense Refill   ALPRAZolam (XANAX) 1 MG tablet Take 0.5-1 mg by mouth daily as needed for anxiety.     Fluorouracil (TOLAK) 4 % CREA Apply 1 application topically at bedtime. 40 g 1   HYDROcodone-acetaminophen (NORCO/VICODIN) 5-325 MG tablet Take 1-2 tablets by mouth every 6 (six) hours as needed for moderate pain or severe pain. 20 tablet 0   losartan (COZAAR) 100 MG tablet Take 1 tablet (100 mg total) by mouth daily. 90 tablet 3   metoprolol tartrate (LOPRESSOR) 100 MG tablet TAKE 1 TABLET(100 MG) BY MOUTH TWICE DAILY 180 tablet 3   rosuvastatin (CRESTOR) 10 MG tablet Take 1 tablet (10 mg total) by mouth daily. 90 tablet 3   TURMERIC PO Take 1 tablet by mouth 2 (two) times daily.     warfarin (COUMADIN) 5 MG tablet TAKE AS DIRECTED BY ANTICOAGULATION CLINIC (Patient taking differently: Take 7.5 mg by mouth See admin instructions. 7.5mg  on all days EXCEPT 10mg  on Saturday) 150 tablet 1   acetaminophen (TYLENOL) 325 MG tablet Take 650 mg by mouth every 6 (six) hours as  needed for mild pain, fever or headache.     No facility-administered medications prior to visit.      Review of Systems  Review of Systems  Constitutional: Negative.   HENT: Negative.    Respiratory:  Negative for cough, chest tightness, shortness of breath and wheezing.        Right sided pleuritic pain; Mild DOE with exercise   Cardiovascular: Negative.     Physical Exam  BP 116/82 (BP Location: Left Arm, Patient Position: Sitting, Cuff Size: Normal)   Pulse 73   Temp 98.2 F (36.8 C) (Oral)   Ht 5\' 11"  (1.803 m)   Wt 233 lb 9.6 oz (106 kg)   SpO2 98%   BMI 32.58 kg/m  Physical Exam Constitutional:      Appearance: Normal appearance.  HENT:     Head: Normocephalic and atraumatic.  Cardiovascular:  Rate and Rhythm: Normal rate. Rhythm irregular.  Pulmonary:     Effort: Pulmonary effort is normal.     Breath sounds: Normal breath sounds.     Comments: Faint rales at bases, hx atelectasis  Musculoskeletal:        General: Normal range of motion.  Skin:    General: Skin is warm and dry.  Neurological:     General: No focal deficit present.     Mental Status: He is alert and oriented to person, place, and time. Mental status is at baseline.  Psychiatric:        Mood and Affect: Mood normal.        Behavior: Behavior normal.        Thought Content: Thought content normal.        Judgment: Judgment normal.     Lab Results:  CBC    Component Value Date/Time   WBC 8.0 11/17/2020 0505   RBC 4.26 11/17/2020 0505   HGB 12.9 (L) 11/17/2020 0505   HGB 15.3 11/16/2017 1027   HCT 39.2 11/17/2020 0505   HCT 44.4 11/16/2017 1027   PLT 124 (L) 11/17/2020 0505   PLT 168 11/16/2017 1027   MCV 92.0 11/17/2020 0505   MCV 86 11/16/2017 1027   MCH 30.3 11/17/2020 0505   MCHC 32.9 11/17/2020 0505   RDW 13.2 11/17/2020 0505   RDW 14.1 11/16/2017 1027   LYMPHSABS 1.3 11/14/2020 2244   MONOABS 2.0 (H) 11/14/2020 2244   EOSABS 0.0 11/14/2020 2244   BASOSABS 0.0  11/14/2020 2244    BMET    Component Value Date/Time   NA 138 11/16/2020 0457   NA 140 12/16/2017 0957   K 3.9 11/16/2020 0457   CL 107 11/16/2020 0457   CO2 23 11/16/2020 0457   GLUCOSE 106 (H) 11/16/2020 0457   BUN 15 11/16/2020 0457   BUN 21 12/16/2017 0957   CREATININE 0.91 11/16/2020 0457   CALCIUM 8.3 (L) 11/16/2020 0457   GFRNONAA >60 11/16/2020 0457   GFRAA >60 02/10/2018 0548    BNP    Component Value Date/Time   BNP 314.3 (H) 06/16/2015 0850    ProBNP    Component Value Date/Time   PROBNP 532 (H) 02/11/2017 1007    Imaging: ECHOCARDIOGRAM COMPLETE  Result Date: 08/02/2021    ECHOCARDIOGRAM REPORT   Patient Name:   MENDY LAPINSKY Date of Exam: 08/02/2021 Medical Rec #:  [id number]      Height:       71.0 in Accession #:    [id number]     Weight:       242.6 lb Date of Birth:  09/14/1951     BSA:          2.289 m Patient Age:    35 years       BP:           144/78 mmHg Patient Gender: M              HR:           89 bpm. Exam Location:  Outpatient Procedure: 2D Echo, Cardiac Doppler and Color Doppler Indications:    I34.0 Nonrheumatic mitral (valve) insufficiency; I48.1                 Persistent atrial fibrillation  History:        Patient has prior history of Echocardiogram examinations, most  recent 07/31/2020. CAD, Arrythmias:Atrial Fibrillation; Risk                 Factors:Hypertension, Dyslipidemia and Non-Smoker.  Sonographer:    Wilkie Aye RVT Sonographer#2:  Leavy Cella RDCS Referring Phys: [place]  1. Poor acoustic windows limit study, make comparisons difficult APical window is foreshortened in some views. Mild apical, distal anterolateral, distal inferolateral l hypokinesis. Global longitudinal strain is -8.6%. Marland Kitchen Left ventricular ejection fraction, by estimation, is 55%%. The left ventricle has low normal function. The left ventricle has no regional wall motion abnormalities. The left ventricular internal cavity size  was moderately dilated. There is mild left ventricular hypertrophy. Left  ventricular diastolic parameters are indeterminate.  2. Right ventricular systolic function is mildly reduced. The right ventricular size is normal.  3. Left atrial size was severely dilated.  4. Right atrial size was moderately dilated.  5. Mild to moderate mitral valve regurgitation.  6. The aortic valve is tricuspid. Aortic valve regurgitation is mild. Mild to moderate aortic valve sclerosis/calcification is present, without any evidence of aortic stenosis.  7. There is mild dilatation of the ascending aorta, measuring 40 mm.  8. The inferior vena cava is normal in size with greater than 50% respiratory variability, suggesting right atrial pressure of 3 mmHg. Comparison(s): EF 55-60%. FINDINGS  Left Ventricle: Poor acoustic windows limit study, make comparisons difficult APical window is foreshortened in some views. Mild apical, distal anterolateral, distal inferolateral l hypokinesis. Global longitudinal strain is -8.6%. Left ventricular ejection fraction, by estimation, is 55%%. The left ventricle has low normal function. The left ventricle has no regional wall motion abnormalities. The left ventricular internal cavity size was moderately dilated. There is mild left ventricular hypertrophy. Left ventricular diastolic parameters are indeterminate. Right Ventricle: The right ventricular size is normal. Right vetricular wall thickness was not assessed. Right ventricular systolic function is mildly reduced. Left Atrium: Left atrial size was severely dilated. Right Atrium: Right atrial size was moderately dilated. Pericardium: There is no evidence of pericardial effusion. Mitral Valve: There is mild thickening of the mitral valve leaflet(s). Mild mitral annular calcification. Mild to moderate mitral valve regurgitation, with posteriorly-directed jet. Tricuspid Valve: The tricuspid valve is normal in structure. Tricuspid valve regurgitation is  mild. Aortic Valve: The aortic valve is tricuspid. Aortic valve regurgitation is mild. Aortic regurgitation PHT measures 340 msec. Mild to moderate aortic valve sclerosis/calcification is present, without any evidence of aortic stenosis. Aortic valve mean gradient measures 1.0 mmHg. Aortic valve peak gradient measures 2.1 mmHg. Pulmonic Valve: The pulmonic valve was normal in structure. Pulmonic valve regurgitation is trivial. Aorta: The aortic root is normal in size and structure. There is mild dilatation of the ascending aorta, measuring 40 mm. Venous: The inferior vena cava is normal in size with greater than 50% respiratory variability, suggesting right atrial pressure of 3 mmHg. IAS/Shunts: No atrial level shunt detected by color flow Doppler.  LEFT VENTRICLE PLAX 2D LVIDd:         5.96 cm  Diastology LVIDs:         4.12 cm  LV e' medial:    7.62 cm/s LV PW:         1.24 cm  LV E/e' medial:  12.0 LV IVS:        1.34 cm  LV e' lateral:   13.60 cm/s LVOT diam:     2.20 cm  LV E/e' lateral: 6.7 LVOT Area:     3.80 cm  RIGHT VENTRICLE RV Basal diam:  3.92 cm RV Mid diam:    3.42 cm RV S prime:     15.00 cm/s TAPSE (M-mode): 2.0 cm LEFT ATRIUM              Index       RIGHT ATRIUM           Index LA diam:        5.30 cm  2.32 cm/m  RA Area:     27.00 cm LA Vol (A2C):   144.0 ml 62.91 ml/m RA Volume:   90.50 ml  39.54 ml/m LA Vol (A4C):   87.7 ml  38.32 ml/m LA Biplane Vol: 112.0 ml 48.93 ml/m  AORTIC VALVE              PULMONIC VALVE AV Vmax:      72.40 cm/s  PR End Diast Vel: 4.06 msec AV Vmean:     52.100 cm/s AV VTI:       0.142 m AV Peak Grad: 2.1 mmHg AV Mean Grad: 1.0 mmHg AI PHT:       340 msec  AORTA Ao Asc diam: 4.00 cm MITRAL VALVE               TRICUSPID VALVE MV Area (PHT): 4.15 cm    TR Peak grad:   33.9 mmHg MV Decel Time: 183 msec    TR Vmax:        291.00 cm/s MR Peak grad: 89.9 mmHg MR Mean grad: 64.0 mmHg    SHUNTS MR Vmax:      474.00 cm/s  Systemic Diam: 2.20 cm MR Vmean:     379.0 cm/s  MV E velocity: 91.55 cm/s Dorris Carnes MD Electronically signed by Dorris Carnes MD Signature Date/Time: 08/02/2021/10:15:07 PM    Final      Assessment & Plan:   Pulmonary nodule - Patient has a hx small central right lower lobe pulmonary nodule that has been stable for 4 years. He has new right sided pleuritic chest pain and rales to lower lobes on exam. Needs follow-up CT chest wo contrast. If nodule remains stable no further follow-up needed with our office and recommend he return to cardiology.    Martyn Ehrich, NP 08/27/2021

## 2021-08-29 DIAGNOSIS — I4891 Unspecified atrial fibrillation: Secondary | ICD-10-CM | POA: Diagnosis not present

## 2021-08-29 DIAGNOSIS — Z7901 Long term (current) use of anticoagulants: Secondary | ICD-10-CM | POA: Diagnosis not present

## 2021-09-03 ENCOUNTER — Other Ambulatory Visit: Payer: Self-pay

## 2021-09-03 ENCOUNTER — Ambulatory Visit (HOSPITAL_COMMUNITY)
Admission: RE | Admit: 2021-09-03 | Discharge: 2021-09-03 | Disposition: A | Discharge status: Home / Self Care | Payer: Medicare Other | Source: Ambulatory Visit | Attending: Primary Care | Admitting: Primary Care

## 2021-09-03 DIAGNOSIS — R911 Solitary pulmonary nodule: Secondary | ICD-10-CM | POA: Diagnosis not present

## 2021-09-03 DIAGNOSIS — R079 Chest pain, unspecified: Secondary | ICD-10-CM | POA: Diagnosis not present

## 2021-09-03 DIAGNOSIS — I700 Atherosclerosis of aorta: Secondary | ICD-10-CM | POA: Diagnosis not present

## 2021-09-03 DIAGNOSIS — I7121 Aneurysm of the ascending aorta, without rupture: Secondary | ICD-10-CM | POA: Diagnosis not present

## 2021-09-03 DIAGNOSIS — I712 Thoracic aortic aneurysm, without rupture, unspecified: Secondary | ICD-10-CM | POA: Diagnosis not present

## 2021-09-03 IMAGING — CT CT CHEST W/O CM
2 of 3 series · 15 of 36 positions shown, 18 images · non-contrast
Comparison: [DATE].

CLINICAL DATA: Chest pain.

EXAM:
CT CHEST WITHOUT CONTRAST
TECHNIQUE: Multidetector CT imaging of the chest was performed following the
standard protocol without IV contrast.

[Series 2: thorax · axial · 0.85mm/px · z∈[+1422,+1700]mm · 12 of 165 slices shown, 15 images]
[im 13/165  mediastinal]
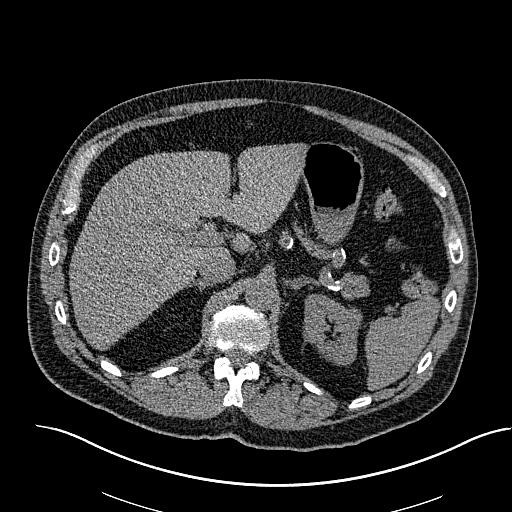
[im 13/165  lung]
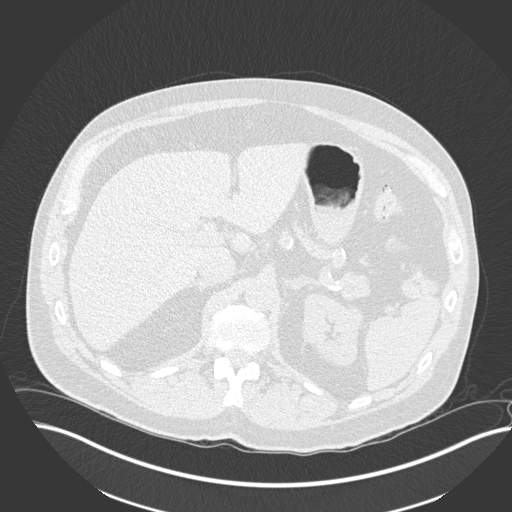
[im 25/165  lung]
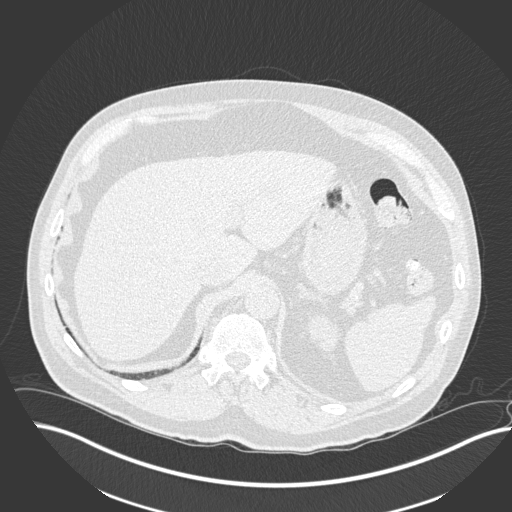
[im 37/165  lung]
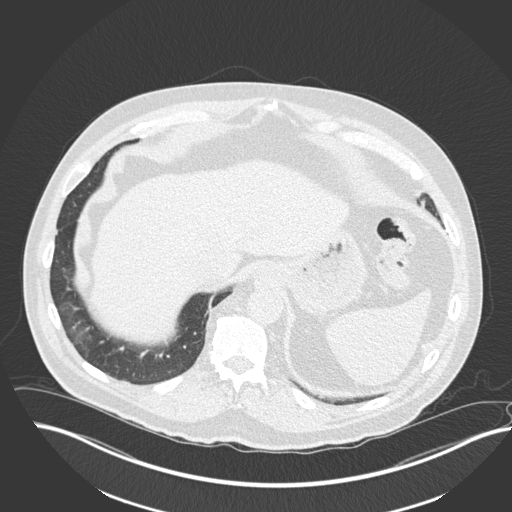
[im 49/165  lung]
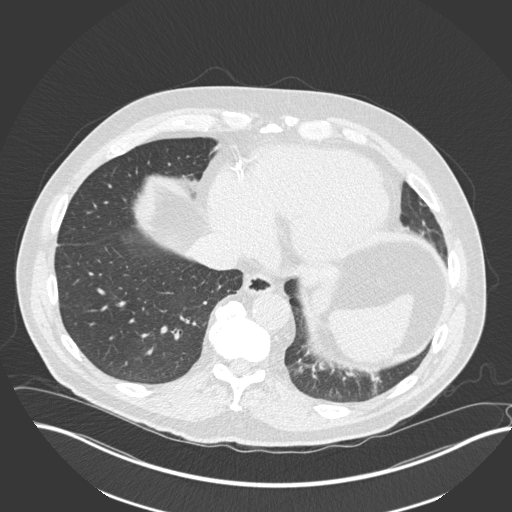
[im 61/165  mediastinal]
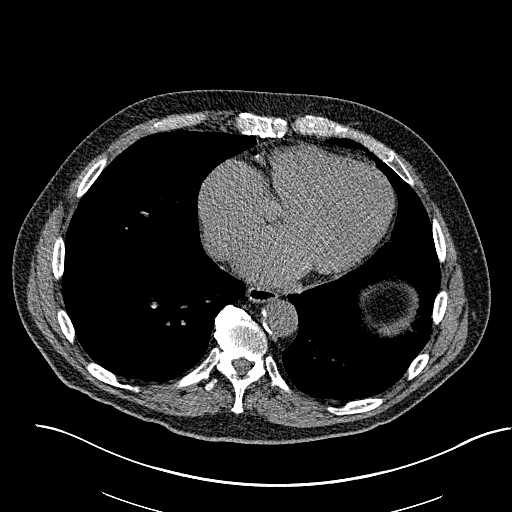
[im 61/165  lung]
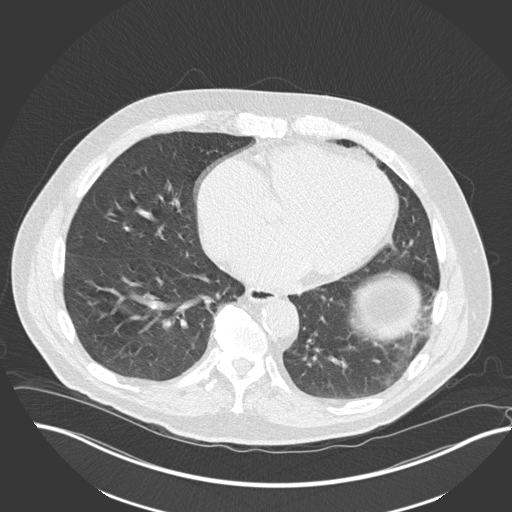
[im 73/165  lung]
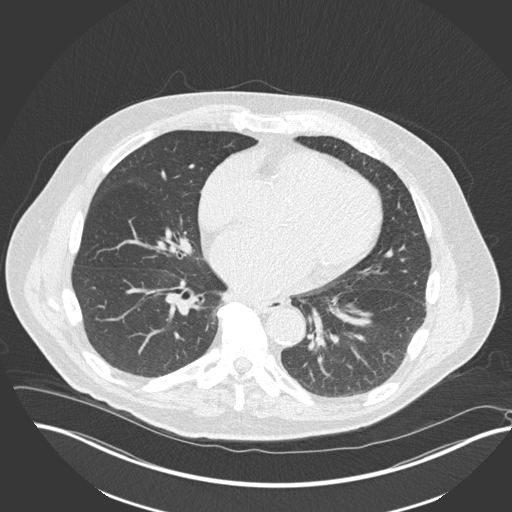
[im 92/165  lung]
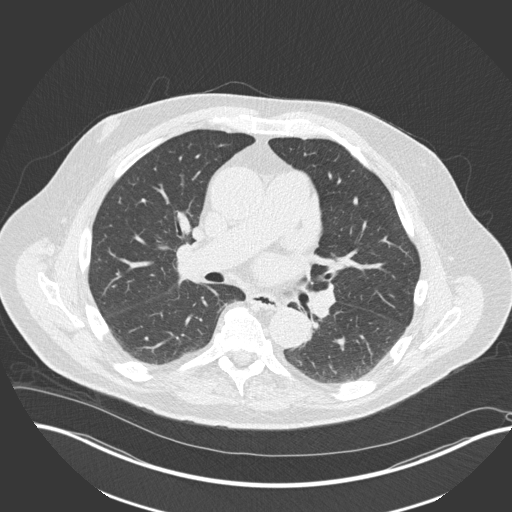
[im 104/165  lung]
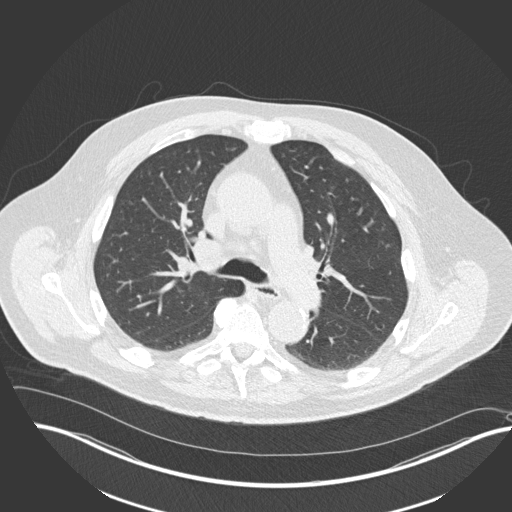
[im 116/165  mediastinal]
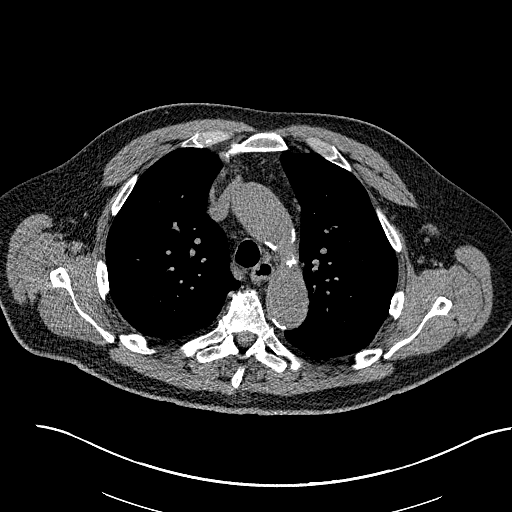
[im 116/165  lung]
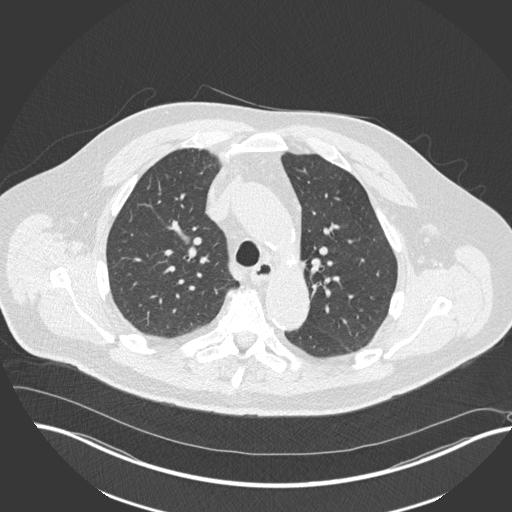
[im 128/165  lung]
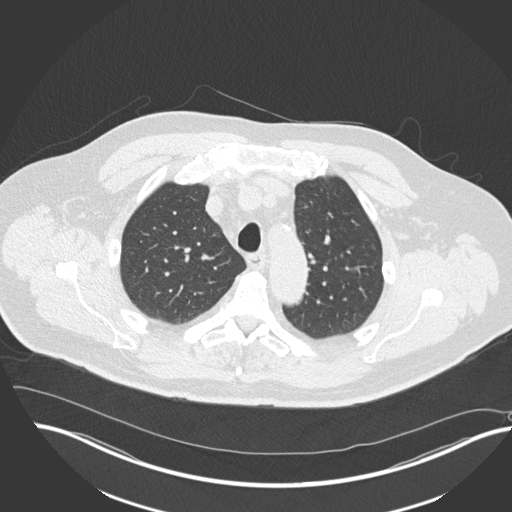
[im 140/165  lung]
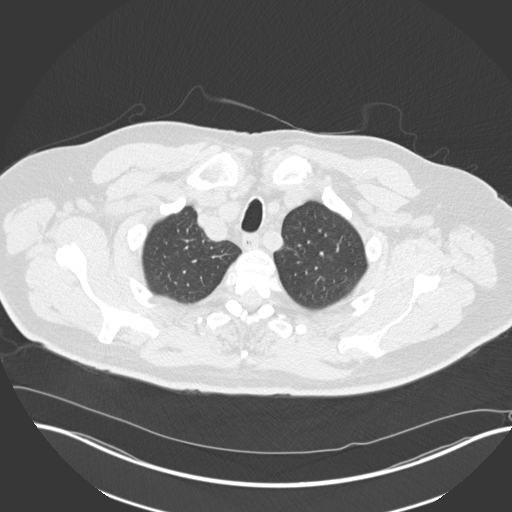
[im 152/165  lung]
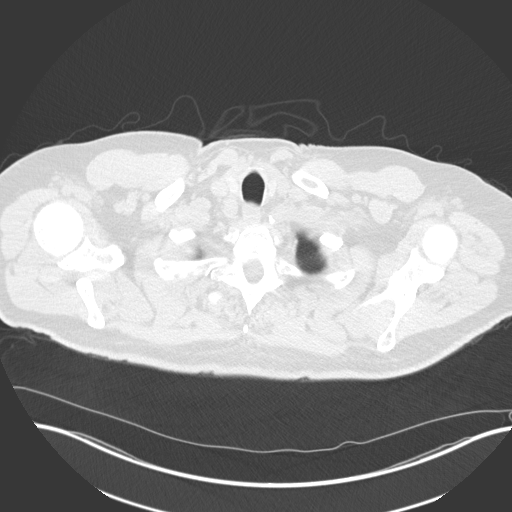

[Series 6: coronal · coronal · 0.68mm/px · 3 of 156 slices shown]
[im 32/156  lung]
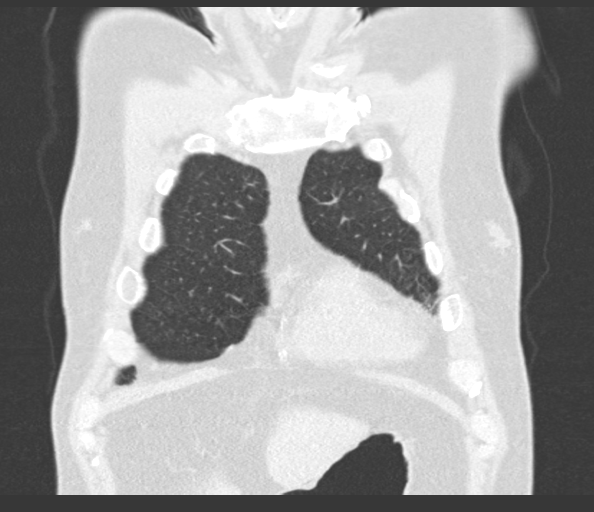
[im 63/156  lung]
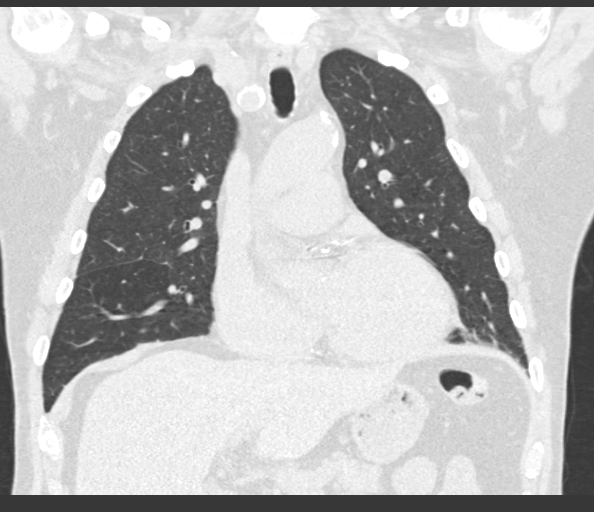
[im 94/156  lung]
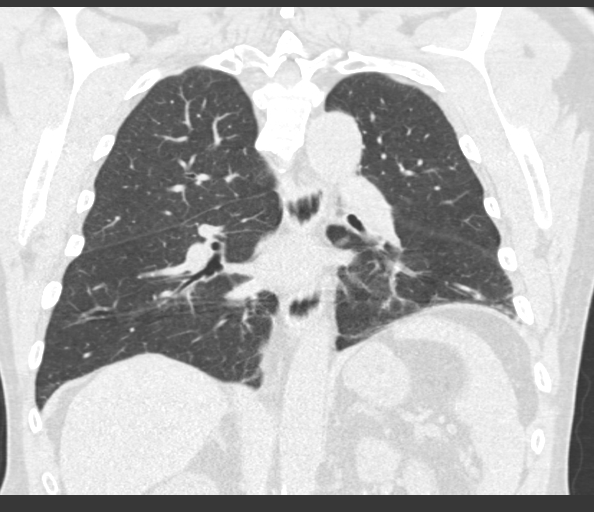

[15 of 36 positions shown; findings below may reference images not displayed]

FINDINGS: Cardiovascular: 4.5 cm ascending thoracic aortic aneurysm is noted
which is increased compared to prior exam. Atherosclerosis of
thoracic aorta is noted. Mild cardiomegaly. No pericardial effusion.
Proximal descending thoracic aorta measures 3.5 cm. Extensive
coronary artery calcifications are noted.

Mediastinum/Nodes: No enlarged mediastinal or axillary lymph nodes.
Thyroid gland, trachea, and esophagus demonstrate no significant
findings.

Lungs/Pleura: Lungs are clear. No pleural effusion or pneumothorax.

Upper Abdomen: No acute abnormality.

Musculoskeletal: No chest wall mass or suspicious bone lesions
identified.
IMPRESSION: 4.5 cm ascending thoracic aortic aneurysm which is enlarged compared
to prior exam. Recommend semi-annual imaging followup by CTA or MRA
and referral to cardiothoracic surgery if not already obtained. This
recommendation follows [BJ]
ACCF/AHA/AATS/ACR/ASA/SCA/ROSHANI/ROSHANI/ROSHANI/ROSHANI Guidelines for the
Diagnosis and Management of Patients With Thoracic Aortic Disease.
Circulation. [BJ]; 121: [id number]. Aortic aneurysm NOS
([BJ]-[BJ]).

Extensive coronary artery calcifications are noted suggesting
coronary artery disease.

Aortic Atherosclerosis ([BJ]-[BJ]).

## 2021-09-05 NOTE — Progress Notes (Signed)
CT chest showed enlarged ascending thoracic aortic aneurysm. Lungs clear. Recommend semi-annual imaging followup by CTA or MRA  Needs referral to cardiothoracic surgery if not already done

## 2021-09-10 ENCOUNTER — Ambulatory Visit (INDEPENDENT_AMBULATORY_CARE_PROVIDER_SITE_OTHER): Payer: Medicare Other | Admitting: Cardiology

## 2021-09-10 ENCOUNTER — Ambulatory Visit (INDEPENDENT_AMBULATORY_CARE_PROVIDER_SITE_OTHER): Payer: Medicare Other

## 2021-09-10 ENCOUNTER — Other Ambulatory Visit: Payer: Self-pay

## 2021-09-10 ENCOUNTER — Encounter: Payer: Self-pay | Admitting: Cardiology

## 2021-09-10 VITALS — BP 178/102 | HR 108 | Ht 71.0 in | Wt 233.0 lb

## 2021-09-10 DIAGNOSIS — R931 Abnormal findings on diagnostic imaging of heart and coronary circulation: Secondary | ICD-10-CM

## 2021-09-10 DIAGNOSIS — I10 Essential (primary) hypertension: Secondary | ICD-10-CM | POA: Diagnosis not present

## 2021-09-10 DIAGNOSIS — G4733 Obstructive sleep apnea (adult) (pediatric): Secondary | ICD-10-CM

## 2021-09-10 DIAGNOSIS — I4821 Permanent atrial fibrillation: Secondary | ICD-10-CM

## 2021-09-10 DIAGNOSIS — I2510 Atherosclerotic heart disease of native coronary artery without angina pectoris: Secondary | ICD-10-CM | POA: Diagnosis not present

## 2021-09-10 DIAGNOSIS — E7800 Pure hypercholesterolemia, unspecified: Secondary | ICD-10-CM

## 2021-09-10 NOTE — Addendum Note (Signed)
Addended by: Antonieta Iba on: 09/10/2021 04:00 PM   Modules accepted: Orders

## 2021-09-10 NOTE — Progress Notes (Unsigned)
Enrolled patient for a 3 day Zio XT monitor to be mailed to patients home  

## 2021-09-10 NOTE — Progress Notes (Signed)
Cardiology Office Note    Date:  09/10/2021   ID:  Troy [name], DOB 1951-06-28, MRN [id number]  PCP:  Velna Hatchet, MD  Cardiologist:  Fransico Him, MD   Chief Complaint  Patient presents with   Coronary Artery Disease   Atrial Fibrillation   Sleep Apnea   Hypertension   Hyperlipidemia     History of Present Illness:  Troy [name] is a 70 y.o. male with a history of ASCAD with DES mLAD & RPDA w/ VF arrest 06/2015, PCI in [place] 2010, HTN, HLD, MR, PAF (not anticoagulated in the past), pulm nodule stable 07/2016.  He had recurrent afib and was loaded with Amio and underwent TEE and cardioverted to NSR but then went back into afib 02/13/2017.  His amio was stopped and had repeat DCCV 02/20/2017.  He was seen by Dr. Curt Bears with EP and stated that he did not wish to have any further DCCV if he went back into afib and would prefer ablation.  He also has OSA and is doing well with his CPAP device. He was admitted in January 2018 with CP and nuclear stress test showed apical ischemia.  He underwent cath showing patent stents with 80% diag and 60% mid RCA.    He has been followed by Dr. Curt Bears and now has longstanding persistent atrial fibrillation on chronic anticoagulation with coumadin for a CHADS2VASC score of 3.    He is here today for followup and is doing well.  He denies any chest pain or pressure, SOB, DOE, PND, orthopnea, LE edema, dizziness, palpitations or syncope. He is compliant with his meds and is tolerating meds with no SE.   He tells me that he recently had a chest CT without contrast done for a lung finding and was found to have an ascending aortic aneurysm.  This was not done with contrast,.    He is doing well with his CPAP device and thinks that he has gotten used to it.  He tolerates the mask and feels the pressure is adequate.  Since going on CPAP he feels rested in the am and has no significant daytime sleepiness.  He denies any significant mouth or nasal dryness or  nasal congestion.  HE does not think that he snores.     Past Medical History:  Diagnosis Date   Arthritis    "knees" (01/02/2017)   Ascending aorta dilatation (HCC)    63mm by CT 2017 and 47mm by CT 2019, 32mm by echo 07/2020 and 99mm by echo 2022   CAD (coronary artery disease)    a. s/p PCI in 2010 in [place]. b. Unstable angina/LHC 05/11/5365 complicated by V fib arrest after contrast injection. Cath 06/19/2015 s/p DES to mid LAD and RPDA. c. 11/2016: chest pain/ abnormal nuclear stress test prompting cath 11/13/16 showing 80% ostial diag, 60% mid RCA, no acute lesions, medical therapy recommended.   Dyslipidemia    Essential hypertension    Lung nodule < 6cm on CT 06/16/2015   Right lung   Mitral regurgitation    mild to moderate by echo 07/2020   OSA on CPAP    Persistent atrial fibrillation ([place])    a. 06/2015 noted to be in new a-fib when arrived with unstable angina. Converted to NSR after being shocked in the cath lab for vfib;  b. 06/2015 Eliquis initiated as PAF noted on event monitor. c. Recurrent atrial fib?flutter 10/2016, issues with recurrent AF requiring multiple DCCVs in 02/2017. Failed sotalol, not candidate for  Tikosyn due to cost/QTC, Multaq not felt likely strong enough.   Pulmonary nodule 07/2015   a. 1.5 x 1.2 cm smoothly marginated nodule in the central aspect of the right lower lobe with recommendation correlation with nonemergent PET-CT to exclude a neoplasm , stable by CT 07/2016   Severe left ventricular hypertrophy    Ventricular fibrillation ([place])    a. occured during cath 06/18/2015.    Past Surgical History:  Procedure Laterality Date   CARDIAC CATHETERIZATION N/A 06/18/2015   Procedure: Left Heart Cath and Coronary Angiography;  Surgeon: Burnell Blanks, MD;  Location: [facility];  Service: Cardiovascular;  Laterality: N/A;   CARDIAC CATHETERIZATION N/A 06/19/2015   Procedure: Coronary Stent Intervention;  Surgeon: Peter M Martinique, MD;  Location: [facility];  Service: Cardiovascular;  Laterality: N/A;   CARDIAC CATHETERIZATION N/A 11/13/2016   Procedure: Left Heart Cath and Coronary Angiography;  Surgeon: Lorretta Harp, MD;  Location: [facility];  Service: Cardiovascular;  Laterality: N/A;   CARDIOVERSION N/A 11/17/2016   Procedure: CARDIOVERSION;  Surgeon: Larey Dresser, MD;  Location: [facility];  Service: Cardiovascular;  Laterality: N/A;   CARDIOVERSION N/A 01/03/2017   Procedure: CARDIOVERSION;  Surgeon: Lelon Perla, MD;  Location: [facility];  Service: Cardiovascular;  Laterality: N/A;   CARDIOVERSION N/A 02/05/2017   Procedure: Cardioversion;  Surgeon: Evans Lance, MD;  Location: [facility];  Service: Cardiovascular;  Laterality: N/A;   CARDIOVERSION N/A 02/20/2017   Procedure: Cardioversion;  Surgeon: Evans Lance, MD;  Location: [facility];  Service: Cardiovascular;  Laterality: N/A;   CARTILAGE SURGERY Bilateral    "thumbs"   CATARACT EXTRACTION W/ INTRAOCULAR LENS  IMPLANT, BILATERAL Bilateral    CORONARY ANGIOPLASTY WITH STENT PLACEMENT  ~ 2007   JOINT REPLACEMENT     KNEE ARTHROSCOPY Right    RETINAL DETACHMENT SURGERY Bilateral    "laser thing"   TEE WITHOUT CARDIOVERSION N/A 11/17/2016   Procedure: TRANSESOPHAGEAL ECHOCARDIOGRAM (TEE);  Surgeon: Larey Dresser, MD;  Location: [facility];  Service: Cardiovascular;  Laterality: N/A;   TOTAL KNEE ARTHROPLASTY Left 2000s   TOTAL KNEE ARTHROPLASTY Right 02/08/2018   Procedure: RIGHT TOTAL KNEE ARTHROPLASTY;  Surgeon: Gaynelle Arabian, MD;  Location: [facility];  Service: Orthopedics;  Laterality: Right;  with block    Current Medications: Current Meds  Medication Sig   ALPRAZolam (XANAX) 1 MG tablet Take 0.5-1 mg by mouth daily as needed for anxiety.   Fluorouracil (TOLAK) 4 % CREA Apply 1 application topically at bedtime.   HYDROcodone-acetaminophen (NORCO/VICODIN) 5-325 MG tablet Take 1-2 tablets by mouth every 6 (six) hours as needed for  moderate pain or severe pain.   losartan (COZAAR) 100 MG tablet Take 1 tablet (100 mg total) by mouth daily.   metoprolol tartrate (LOPRESSOR) 100 MG tablet TAKE 1 TABLET(100 MG) BY MOUTH TWICE DAILY   rosuvastatin (CRESTOR) 10 MG tablet Take 1 tablet (10 mg total) by mouth daily.   TURMERIC PO Take 1 tablet by mouth 2 (two) times daily.   warfarin (COUMADIN) 5 MG tablet TAKE AS DIRECTED BY ANTICOAGULATION CLINIC (Patient taking differently: Take 7.5 mg by mouth See admin instructions. 7.5mg  on all days EXCEPT 10mg  on Saturday)    Allergies:   Gluten meal, Iodinated diagnostic agents, Metrizamide, Tizanidine, Whey, Morphine and related, Rosuvastatin, Apple, Tizanidine hcl, and Wheat bran   Social History   Socioeconomic History   Marital status: Divorced  Spouse name: Not on file   Number of children: Not on file   Years of education: Not on file   Highest education level: Not on file  Occupational History   Not on file  Tobacco Use   Smoking status: Never   Smokeless tobacco: Never   Tobacco comments:    Second-hand exposure through parents  Vaping Use   Vaping Use: Never used  Substance and Sexual Activity   Alcohol use: Yes    Alcohol/week: 2.0 standard drinks    Types: 2 Glasses of wine per week   Drug use: No   Sexual activity: Yes  Other Topics Concern   Not on file  Social History Narrative   Originally he is from [place], [place]. He moved to [facility] [place] [facility] in 2013. He has a dog & cat at home. No bird or mold exposure. Has a hot tub but hasn't used it in 4 months. 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    Social Determinants of Health   Financial Resource Strain: Not on file  Food Insecurity: Not on file  Transportation Needs: Not on file  Physical Activity: Not on file  Stress: Not on file  Social Connections: Not on file     Family History:  The patient's family history includes Diabetes in his sister; Drug abuse in his father; Emphysema in his mother; Heart attack in his  father, maternal grandfather, and paternal grandfather; Lung cancer in his mother; Stroke in his mother.   ROS:   Please see the history of present illness.    ROS All other systems reviewed and are negative.  No flowsheet data found.     PHYSICAL EXAM:   VS:  BP (!) 178/102   Pulse (!) 108   Ht 5\' 11"  (1.803 m)   Wt 233 lb (105.7 kg)   SpO2 97%   BMI 32.50 kg/m    GEN: Well nourished, well developed in no acute distress HEENT: Normal NECK: No JVD; No carotid bruits LYMPHATICS: No lymphadenopathy CARDIAC:RRR, no murmurs, rubs, gallops RESPIRATORY:  Clear to auscultation without rales, wheezing or rhonchi  ABDOMEN: Soft, non-tender, non-distended MUSCULOSKELETAL:  No edema; No deformity  SKIN: Warm and dry NEUROLOGIC:  Alert and oriented x 3 PSYCHIATRIC:  Normal affect   Wt Readings from Last 3 Encounters:  09/10/21 233 lb (105.7 kg)  08/27/21 233 lb 9.6 oz (106 kg)  04/02/21 242 lb 9.6 oz (110 kg)      Studies/Labs Reviewed:   EKG:  EKG is not ordered today.    Recent Labs: 11/16/2020: BUN 15; Creatinine, Ser 0.91; Potassium 3.9; Sodium 138 11/17/2020: Hemoglobin 12.9; Platelets 124 07/08/2021: ALT 19   Lipid Panel    Component Value Date/Time   CHOL 169 08/12/2021 1013   TRIG 165 (H) 08/12/2021 1013   HDL 47 08/12/2021 1013   CHOLHDL 3.6 08/12/2021 1013   CHOLHDL 2.5 08/15/2015 0912   VLDL 15 08/15/2015 0912   LDLCALC 93 08/12/2021 1013    Additional studies/ records that were reviewed today include:  Cath report    ASSESSMENT:    1. Coronary artery disease involving native coronary artery of native heart without angina pectoris   2. Permanent atrial fibrillation ([place])   3. Essential (primary) hypertension   4. Pure hypercholesterolemia   5. OSA (obstructive sleep apnea)      PLAN:  In order of problems listed above:  1.  ASCAD  - s/p DES mLAD & RPDA w/ VF arrest 06/2015, PCI in [place] 2010  and recent cath 11/2016 with patent grafts and 80% D1 and  60% RCA.   -Has not had any anginal symptoms since I saw him last -Continue prescription drug management with Lopressor 100 mg twice daily with as needed refills -statin intolerant -He is not on ASA due to NOAC.  2.  Permenant atrial fibrillation  -He remains in atrial fibrillation and he denies any palpitations -HR is 108bpm today but just finished playing pickle ball -I will get a 3 day ziopatch for 3 days to get average HR -He has not had any bleeding problems on warfarin -Continue prescription drug management with Lopressor 100 mg twice daily and warfarin managed in our Coumadin clinic  -Check BMP and CBC today  3.  HTN  -BP is well controlled on exam today -Continue prescription drug management with losartan 100 mg daily and Lopressor 100 mg twice daily with as needed refills   4.  Dyslipidemia  -with LDL goal < 70.  -he stopped his statin due to intolerance -He was seen in lipid clinic and restarted on Crestor 10 mg daily and repeat FLP showed LDL of 93 and HDL 47>he does not think he will tolerate a higher dose -followup with PharmD to try to get LDL < 70   5. OSA - The patient is tolerating PAP therapy well without any problems. The PAP download performed by his DME was personally reviewed and interpreted by me today and showed an AHI of 1/hr on 10 cm H2O with 87% compliance in using more than 4 hours nightly.  The patient has been using and benefiting from PAP use and will continue to benefit from therapy.     Medication Adjustments/Labs and Tests Ordered: Current medicines are reviewed at length with the patient today.  Concerns regarding medicines are outlined above.  Medication changes, Labs and Tests ordered today are listed in the Patient Instructions below.  There are no Patient Instructions on file for this visit.   Signed, Fransico Him, MD  09/10/2021 3:49 PM    [address], [place], [place]  [address] Phone: [phone];  Fax: [phone]

## 2021-09-10 NOTE — Patient Instructions (Signed)
Medication Instructions:  Your physician recommends that you continue on your current medications as directed. Please refer to the Current Medication list given to you today.  *If you need a refill on your cardiac medications before your next appointment, please call your pharmacy*   Lab Work: TODAY: BMET and CBC If you have labs (blood work) drawn today and your tests are completely normal, you will receive your results only by: [place] (if you have MyChart) OR A paper copy in the mail If you have any lab test that is abnormal or we need to change your treatment, we will call you to review the results.   Testing/Procedures: Your physician has requested that you have a cardiac MRI. Cardiac MRI uses a computer to create images of your heart as its beating, producing both still and moving pictures of your heart and major blood vessels. For further information please visit [email or web address] Please follow the instruction sheet given to you today for more information.  Your physician has recommended that you wear an event monitor. Event monitors are medical devices that record the heart's electrical activity. Doctors most often [place] these monitors to diagnose arrhythmias. Arrhythmias are problems with the speed or rhythm of the heartbeat. The monitor is a small, portable device. You can wear one while you do your normal daily activities. This is usually used to diagnose what is causing palpitations/syncope (passing out).  Follow-Up: At [facility], you and your health needs are our priority.  As part of our continuing mission to provide you with exceptional heart care, we have created designated Provider Care Teams.  These Care Teams include your primary Cardiologist (physician) and Advanced Practice Providers (APPs -  Physician Assistants and Nurse Practitioners) who all work together to provide you with the care you need, when you need it.   Your next appointment:   1 year(s)  The  format for your next appointment:   In Person  Provider:   You may see Fransico Him, MD or one of the following Advanced Practice Providers on your designated Care Team:   [name], PA-C Ermalinda Barrios, PA-C  You have been referred to see the PharmD in the [facility] Instructions  Your physician has requested you wear a ZIO patch monitor for 3 days.  This is a single patch monitor. [employer] supplies one patch monitor per enrollment. Additional stickers are not available. Please do not apply patch if you will be having a Nuclear Stress Test,  Echocardiogram, Cardiac CT, MRI, or Chest Xray during the period you would be wearing the  monitor. The patch cannot be worn during these tests. You cannot remove and re-apply the  ZIO XT patch monitor.  Your ZIO patch monitor will be mailed 3 day USPS to your address on file. It may take 3-5 days  to receive your monitor after you have been enrolled.  Once you have received your monitor, please review the enclosed instructions. Your monitor  has already been registered assigning a specific monitor serial # to you.  Billing and Patient Assistance Program Information  We have supplied [employer] with any of your insurance information on file for billing purposes. [employer] offers a sliding scale Patient Assistance Program for patients that do not have  insurance, or whose insurance does not completely cover the cost of the ZIO monitor.  You must apply for the Patient Assistance Program to qualify for this discounted rate.  To apply, please call [employer] at [phone],  select option 4, select option 2, ask to apply for  Patient Assistance Program. Theodore Demark will ask your household income, and how many people  are in your household. They will quote your out-of-pocket cost based on that information.  [employer] will also be able to set up a 43-month, interest-free payment plan if needed.  Applying the monitor   Shave hair from  upper left chest.  Hold abrader disc by orange tab. Rub abrader in 40 strokes over the upper left chest as  indicated in your monitor instructions.  Clean area with 4 enclosed alcohol pads. Let dry.  Apply patch as indicated in monitor instructions. Patch will be placed under collarbone on left  side of chest with arrow pointing upward.  Rub patch adhesive wings for 2 minutes. Remove white label marked "1". Remove the white  label marked "2". Rub patch adhesive wings for 2 additional minutes.  While looking in a mirror, press and release button in center of patch. A small green light will  flash 3-4 times. This will be your only indicator that the monitor has been turned on.  Do not shower for the first 24 hours. You may shower after the first 24 hours.  Press the button if you feel a symptom. You will hear a small click. Record Date, Time and  Symptom in the Patient Logbook.  When you are ready to remove the patch, follow instructions on the last 2 pages of Patient  Logbook. Stick patch monitor onto the last page of Patient Logbook.  Place Patient Logbook in the blue and white box. Use locking tab on box and tape box closed  securely. The blue and white box has prepaid postage on it. Please place it in the mailbox as  soon as possible. Your physician should have your test results approximately 7 days after the  monitor has been mailed back to [facility]  Call [place] at [phone] if you have questions regarding  your ZIO XT patch monitor. Call them immediately if you see an orange light blinking on your  monitor.  If your monitor falls off in less than 4 days, contact our Monitor department at [phone].  If your monitor becomes loose or falls off after 4 days call [employer] at [phone] for  suggestions on securing your monitor

## 2021-09-11 ENCOUNTER — Other Ambulatory Visit: Payer: Self-pay | Admitting: *Deleted

## 2021-09-11 DIAGNOSIS — R9389 Abnormal findings on diagnostic imaging of other specified body structures: Secondary | ICD-10-CM

## 2021-09-11 DIAGNOSIS — I7121 Aneurysm of the ascending aorta, without rupture: Secondary | ICD-10-CM

## 2021-09-11 LAB — BASIC METABOLIC PANEL
BUN/Creatinine Ratio: 22 (ref 10–24)
BUN: 25 mg/dL (ref 8–27)
CO2: 24 mmol/L (ref 20–29)
Calcium: 9.6 mg/dL (ref 8.6–10.2)
Chloride: 103 mmol/L (ref 96–106)
Creatinine, Ser: 1.16 mg/dL (ref 0.76–1.27)
Glucose: 84 mg/dL (ref 70–99)
Potassium: 4.7 mmol/L (ref 3.5–5.2)
Sodium: 144 mmol/L (ref 134–144)
eGFR: 67 mL/min/{1.73_m2} (ref >59)

## 2021-09-11 LAB — CBC
Hematocrit: 45.1 % (ref 37.5–51.0)
Hemoglobin: 15.1 g/dL (ref 13.0–17.7)
MCH: 29.5 pg (ref 26.6–33.0)
MCHC: 33.5 g/dL (ref 31.5–35.7)
MCV: 88 fL (ref 79–97)
Platelets: 176 10*3/uL (ref 150–450)
RBC: 5.12 x10E6/uL (ref 4.14–5.80)
RDW: 13.6 % (ref 11.6–15.4)
WBC: 8.2 10*3/uL (ref 3.4–10.8)

## 2021-09-12 DIAGNOSIS — H35371 Puckering of macula, right eye: Secondary | ICD-10-CM | POA: Diagnosis not present

## 2021-09-12 DIAGNOSIS — E7800 Pure hypercholesterolemia, unspecified: Secondary | ICD-10-CM | POA: Diagnosis not present

## 2021-09-12 DIAGNOSIS — I4821 Permanent atrial fibrillation: Secondary | ICD-10-CM

## 2021-09-12 DIAGNOSIS — H35031 Hypertensive retinopathy, right eye: Secondary | ICD-10-CM | POA: Diagnosis not present

## 2021-09-12 DIAGNOSIS — I10 Essential (primary) hypertension: Secondary | ICD-10-CM

## 2021-09-12 DIAGNOSIS — H43391 Other vitreous opacities, right eye: Secondary | ICD-10-CM | POA: Diagnosis not present

## 2021-09-12 DIAGNOSIS — I4891 Unspecified atrial fibrillation: Secondary | ICD-10-CM | POA: Diagnosis not present

## 2021-09-12 DIAGNOSIS — I2510 Atherosclerotic heart disease of native coronary artery without angina pectoris: Secondary | ICD-10-CM | POA: Diagnosis not present

## 2021-09-12 DIAGNOSIS — G4733 Obstructive sleep apnea (adult) (pediatric): Secondary | ICD-10-CM | POA: Diagnosis not present

## 2021-09-12 DIAGNOSIS — Z7901 Long term (current) use of anticoagulants: Secondary | ICD-10-CM | POA: Diagnosis not present

## 2021-09-12 DIAGNOSIS — H43811 Vitreous degeneration, right eye: Secondary | ICD-10-CM | POA: Diagnosis not present

## 2021-09-16 NOTE — Progress Notes (Signed)
Patient ID: Troy [name]                 DOB: 1951/06/29                    MRN: [id number]     HPI: Troy [name] is a 70 y.o. male patient referred to lipid clinic by Dr. Radford Pax. PMH is significant for ASCAD with DES mLAD & RPDA w/ VF arrest 06/2015, PCI in [place] in 2010, HTN, HLD, OSA, MR, persistent afib on warfarin, pulm nodule stable 07/2016. Admitted in January 2018 with CP and nuclear stress test showed apical ischemia.  He underwent cath showing patent stents with 80% diag and 60% mid RCA. Ascending aortic aneurysm seen on CT chest 09/03/21. Seen by lipid clinic 04/22/21, started on rosuvastatin 40 mg daily which patient stopped without calling clinic due to myalgias. Restarted on 10 mg daily which 4 weeks later the patient reported taking and tolerating and was referred to lipid clinic for PCSK9i.   Today, patient arrives in good spirits. States he is doing well on rosuvastatin 10 mg daily and was very pleased that he not only has had no myalgias on this medication but that his LDL responded well to it. He reports improvement in his diet in the last few weeks and is active playing pickleball.   Current Medications: rosuvastatin 10 mg daily Intolerances: rosuvastatin 40 mg (myalgias), atorvastatin (muscle pain/tingling in legs) ezetimibe (pt doesn't remember being on) Risk Factors: ASCAD, HTN, HLD, OSA LDL goal: <55 mg/dL (progressive ASCVD)  Diet:  Breakfast - Omelet, sausage, oatmeal. Water, Vitamin water, half-cup coffee.  Lunch-  Doesn't typically eat lunch Dinner - Meat balls, grilled chicken, rice, vegetables   Exercise: Pickleball ~ twice a week, works in [employer]   Family History: Diabetes in his sister; Drug abuse in his father; Emphysema in his mother; Heart attack in his father, maternal grandfather, and paternal grandfather; Lung cancer in his mother; Stroke in his mother.   Social History: Never smoker  Labs: -08/12/21: TC 169, TG 165, HDL 47, LDL 93 (rosuvastatin 10  mg) -07/08/21: TC 280, TG 189, HDL 43, LDL 201 (no meds) -03/13/21: LDL 175, HDL 41, TG 389 (no meds)  Past Medical History:  Diagnosis Date   Arthritis    "knees" (01/02/2017)   Ascending aorta dilatation (HCC)    48mm by CT 2017 and 35mm by CT 2019, 70mm by echo 07/2020 and 70mm by echo 2022   CAD (coronary artery disease)    a. s/p PCI in 2010 in [place]. b. Unstable angina/LHC 03/10/7000 complicated by V fib arrest after contrast injection. Cath 06/19/2015 s/p DES to mid LAD and RPDA. c. 11/2016: chest pain/ abnormal nuclear stress test prompting cath 11/13/16 showing 80% ostial diag, 60% mid RCA, no acute lesions, medical therapy recommended.   Dyslipidemia    Essential hypertension    Lung nodule < 6cm on CT 06/16/2015   Right lung   Mitral regurgitation    mild to moderate by echo 07/2020   OSA on CPAP    Persistent atrial fibrillation ([place])    a. 06/2015 noted to be in new a-fib when arrived with unstable angina. Converted to NSR after being shocked in the cath lab for vfib;  b. 06/2015 Eliquis initiated as PAF noted on event monitor. c. Recurrent atrial fib?flutter 10/2016, issues with recurrent AF requiring multiple DCCVs in 02/2017. Failed sotalol, not candidate for Tikosyn due to cost/QTC, Multaq not felt likely strong enough.  Pulmonary nodule 07/2015   a. 1.5 x 1.2 cm smoothly marginated nodule in the central aspect of the right lower lobe with recommendation correlation with nonemergent PET-CT to exclude a neoplasm , stable by CT 07/2016   Severe left ventricular hypertrophy    Ventricular fibrillation ([place])    a. occured during cath 06/18/2015.    Current Outpatient Medications on File Prior to Visit  Medication Sig Dispense Refill   acetaminophen (TYLENOL) 325 MG tablet Take 650 mg by mouth every 6 (six) hours as needed for mild pain, fever or headache.     ALPRAZolam (XANAX) 1 MG tablet Take 0.5-1 mg by mouth daily as needed for anxiety.     Fluorouracil (TOLAK) 4 % CREA Apply 1  application topically at bedtime. 40 g 1   HYDROcodone-acetaminophen (NORCO/VICODIN) 5-325 MG tablet Take 1-2 tablets by mouth every 6 (six) hours as needed for moderate pain or severe pain. 20 tablet 0   losartan (COZAAR) 100 MG tablet Take 1 tablet (100 mg total) by mouth daily. 90 tablet 3   metoprolol tartrate (LOPRESSOR) 100 MG tablet TAKE 1 TABLET(100 MG) BY MOUTH TWICE DAILY 180 tablet 3   rosuvastatin (CRESTOR) 10 MG tablet Take 1 tablet (10 mg total) by mouth daily. 90 tablet 3   TURMERIC PO Take 1 tablet by mouth 2 (two) times daily.     warfarin (COUMADIN) 5 MG tablet TAKE AS DIRECTED BY ANTICOAGULATION CLINIC (Patient taking differently: Take 7.5 mg by mouth See admin instructions. 7.5mg  on all days EXCEPT 10mg  on Saturday) 150 tablet 1   No current facility-administered medications on file prior to visit.    Allergies  Allergen Reactions   Gluten Meal Other (See Comments)    REACTION: Celiac disease (severe stomach pain) REACTION: Celiac disease (severe stomach pain)    Iodinated Diagnostic Agents Other (See Comments) and Anaphylaxis    V-Fib arrest during cardiac cath suspected d/t contrast dye in 2016 Cardiac arrest Cardiac arrest V-Fib arrest during cardiac cath suspected d/t contrast dye in 2016 Cardiac arrest   Metrizamide Other (See Comments)    V-Fib arrest during cardiac cath suspected d/t contrast dye in 2016   Tizanidine Anxiety    Depression   [place] Other (See Comments)    REACTION: Celiac disease (severe stomach pain)   Morphine And Related Other (See Comments)    REACTION: "REALLY BAD STOMACH PAIN"   Rosuvastatin     Myalgias on 40mg  daily   Apple Diarrhea   Tizanidine Hcl Anxiety    Depression   Wheat Bran Other (See Comments)    Assessment/Plan:  1. Hyperlipidemia - Most recent LDL of 93 is not at goal <55 mg/dL on rosuvastatin 10 mg daily though this is a >50% reduction in LDL since starting statin. He was previously intolerant to a higher dose of  rosuvastatin as well as atorvastatin. Discussed medication options to add on to lower his LDL to goal including PCSK9i and Leqvio. After discussing the LDL lowering and administration frequency of each one, he prefers to start [place]. Will submit paperwork to his insurance to determine coverage and let him know once this is approved. He has a Medicare supplement plan K which should cover Leqvio well. This will be an injection at month 0, 3, and every 6 months thereafter that is given at our infusion center. Of note, had he been interested in PCSK9i, he has a >$400 deductible and cost per 30 day supply would be >$40, but his income would qualify  him for [facility]. Will continue rosuvastatin 10 mg daily and plan to add Leqvio if covered. Will call him to update him on the status of this and schedule him for his appt and follow up labs at that time.   Rebbeca Paul, PharmD PGY2 Ambulatory Care Pharmacy Resident 09/17/2021 9:33 AM

## 2021-09-17 ENCOUNTER — Other Ambulatory Visit: Payer: Self-pay

## 2021-09-17 ENCOUNTER — Institutional Professional Consult (permissible substitution) (INDEPENDENT_AMBULATORY_CARE_PROVIDER_SITE_OTHER): Payer: Medicare Other | Admitting: Physician Assistant

## 2021-09-17 ENCOUNTER — Ambulatory Visit (INDEPENDENT_AMBULATORY_CARE_PROVIDER_SITE_OTHER): Payer: Medicare Other | Admitting: Student-PharmD

## 2021-09-17 VITALS — BP 169/136 | HR 73 | Resp 20 | Ht 71.0 in | Wt 231.4 lb

## 2021-09-17 DIAGNOSIS — I10 Essential (primary) hypertension: Secondary | ICD-10-CM | POA: Diagnosis not present

## 2021-09-17 DIAGNOSIS — I2510 Atherosclerotic heart disease of native coronary artery without angina pectoris: Secondary | ICD-10-CM

## 2021-09-17 DIAGNOSIS — I4811 Longstanding persistent atrial fibrillation: Secondary | ICD-10-CM

## 2021-09-17 DIAGNOSIS — I7121 Aneurysm of the ascending aorta, without rupture: Secondary | ICD-10-CM | POA: Diagnosis not present

## 2021-09-17 DIAGNOSIS — E785 Hyperlipidemia, unspecified: Secondary | ICD-10-CM

## 2021-09-17 DIAGNOSIS — I340 Nonrheumatic mitral (valve) insufficiency: Secondary | ICD-10-CM

## 2021-09-17 NOTE — Patient Instructions (Signed)
Nice to see you today!  Keep up the good work with diet and exercise. Aim for a diet full of vegetables, fruit and lean meats (chicken, [place], fish). Try to limit carbs (bread, pasta, sugar, rice) and red meat consumption.  Your goal LDL is less than 55 mg/dL, you're currently at 93 mg/dL  Medication Changes: Continue rosuvastatin 10 mg daily  I am submitting paperwork for Leqvio (inclisiran) to your insurance to see if they cover it. This medication is an injection given a 0, 3, and every 6 months thereafter.   Please give [place] a call at [phone] with any questions or concerns.

## 2021-09-17 NOTE — Patient Instructions (Signed)
We will make referral to the hypertension clinic.  Avoid strenuous activity  Follow-up in 6 months with repeat noncontrast CT chest  Avoid fluoroquinolones

## 2021-09-17 NOTE — Progress Notes (Signed)
PCP is Velna Hatchet, MD Referring Provider is Martyn Ehrich, NP  Reason for referral: Ascending aortic aneurysm   HPI: Troy [name] is a 70 year old male with a history of coronary disease, status post PTCA with a drug-eluting stent to the LAD and RPDA in August 2016.  Prior to that, he also had PCI in 2010.  He also has history of hypertension, dyslipidemia, paroxysmal atrial fibrillation, and mild to moderate mitral insufficiency.  He has been known to have dilation of the ascending aorta dating back to 2017.  It was measured at 39 mm by CT scan.  Subsequent CT scanning in 2019.  42 mm.  Echocardiogram earlier this year at 40 mm.  He had a noncontrast (has allergy to IV contrast) CT scan of his chest on 09/03/2021 showing progression of the ascending aortic dilation to 45 mm.   He followed by the cardiology service and was last seen by Dr. Fransico Him 1 week ago for management of his coronary disease, hypertension, dyslipidemia, and atrial fibrillation. His blood pressure at that visit was 178/102. He had an event monitor placed in tells me he just sent it back in yesterday.  He is scheduled for cardiac MRI next month by Dr. Radford Pax.  He was referred to cardiothoracic surgery for initial evaluation and surveillance of this lesion.  Troy [name] denies having any chest pain.  He does have shortness of breath with minimal activity but this is not changed in the past several years.  He notices patient time to time.  He has had 10 pound weight loss in the past 3 months as a result in a change in his eating and exercise habits.  He has never smoked.         Past Medical History:  Diagnosis Date   Arthritis    "knees" (01/02/2017)   Ascending aorta dilatation (HCC)    83mm by CT 2017 and 41mm by CT 2019, 36mm by echo 07/2020 and 67mm by echo 2022   CAD (coronary artery disease)    a. s/p PCI in 2010 in [place]. b. Unstable angina/LHC 12/18/3149 complicated by V fib arrest after contrast  injection. Cath 06/19/2015 s/p DES to mid LAD and RPDA. c. 11/2016: chest pain/ abnormal nuclear stress test prompting cath 11/13/16 showing 80% ostial diag, 60% mid RCA, no acute lesions, medical therapy recommended.   Dyslipidemia    Essential hypertension    Lung nodule < 6cm on CT 06/16/2015   Right lung   Mitral regurgitation    mild to moderate by echo 07/2020   OSA on CPAP    Persistent atrial fibrillation ([place])    a. 06/2015 noted to be in new a-fib when arrived with unstable angina. Converted to NSR after being shocked in the cath lab for vfib;  b. 06/2015 Eliquis initiated as PAF noted on event monitor. c. Recurrent atrial fib?flutter 10/2016, issues with recurrent AF requiring multiple DCCVs in 02/2017. Failed sotalol, not candidate for Tikosyn due to cost/QTC, Multaq not felt likely strong enough.   Pulmonary nodule 07/2015   a. 1.5 x 1.2 cm smoothly marginated nodule in the central aspect of the right lower lobe with recommendation correlation with nonemergent PET-CT to exclude a neoplasm , stable by CT 07/2016   Severe left ventricular hypertrophy    Ventricular fibrillation ([place])    a. occured during cath 06/18/2015.    Past Surgical History:  Procedure Laterality Date   CARDIAC CATHETERIZATION N/A 06/18/2015   Procedure: Left Heart Cath and  Coronary Angiography;  Surgeon: Burnell Blanks, MD;  Location: [facility];  Service: Cardiovascular;  Laterality: N/A;   CARDIAC CATHETERIZATION N/A 06/19/2015   Procedure: Coronary Stent Intervention;  Surgeon: Peter M Martinique, MD;  Location: [facility];  Service: Cardiovascular;  Laterality: N/A;   CARDIAC CATHETERIZATION N/A 11/13/2016   Procedure: Left Heart Cath and Coronary Angiography;  Surgeon: Lorretta Harp, MD;  Location: [facility];  Service: Cardiovascular;  Laterality: N/A;   CARDIOVERSION N/A 11/17/2016   Procedure: CARDIOVERSION;  Surgeon: Larey Dresser, MD;  Location: [facility];  Service:  Cardiovascular;  Laterality: N/A;   CARDIOVERSION N/A 01/03/2017   Procedure: CARDIOVERSION;  Surgeon: Lelon Perla, MD;  Location: [facility];  Service: Cardiovascular;  Laterality: N/A;   CARDIOVERSION N/A 02/05/2017   Procedure: Cardioversion;  Surgeon: Evans Lance, MD;  Location: [facility];  Service: Cardiovascular;  Laterality: N/A;   CARDIOVERSION N/A 02/20/2017   Procedure: Cardioversion;  Surgeon: Evans Lance, MD;  Location: [facility];  Service: Cardiovascular;  Laterality: N/A;   CARTILAGE SURGERY Bilateral    "thumbs"   CATARACT EXTRACTION W/ INTRAOCULAR LENS  IMPLANT, BILATERAL Bilateral    CORONARY ANGIOPLASTY WITH STENT PLACEMENT  ~ 2007   JOINT REPLACEMENT     KNEE ARTHROSCOPY Right    RETINAL DETACHMENT SURGERY Bilateral    "laser thing"   TEE WITHOUT CARDIOVERSION N/A 11/17/2016   Procedure: TRANSESOPHAGEAL ECHOCARDIOGRAM (TEE);  Surgeon: Larey Dresser, MD;  Location: [facility];  Service: Cardiovascular;  Laterality: N/A;   TOTAL KNEE ARTHROPLASTY Left 2000s   TOTAL KNEE ARTHROPLASTY Right 02/08/2018   Procedure: RIGHT TOTAL KNEE ARTHROPLASTY;  Surgeon: Gaynelle Arabian, MD;  Location: [facility];  Service: Orthopedics;  Laterality: Right;  with block    Family History  Problem Relation Age of Onset   Stroke Mother    Emphysema Mother    Lung cancer Mother    Heart attack Father    Drug abuse Father    Heart attack Maternal Grandfather    Heart attack Paternal Grandfather    Diabetes Sister     Social History Social History   Tobacco Use   Smoking status: Never   Smokeless tobacco: Never   Tobacco comments:    Second-hand exposure through parents  Vaping Use   Vaping Use: Never used  Substance Use Topics   Alcohol use: Yes    Alcohol/week: 2.0 standard drinks    Types: 2 Glasses of wine per week   Drug use: No    Current Outpatient Medications  Medication Sig Dispense Refill   ALPRAZolam (XANAX) 1 MG tablet Take 0.5-1 mg by mouth  daily as needed for anxiety.     Fluorouracil (TOLAK) 4 % CREA Apply 1 application topically at bedtime. 40 g 1   HYDROcodone-acetaminophen (NORCO/VICODIN) 5-325 MG tablet Take 1-2 tablets by mouth every 6 (six) hours as needed for moderate pain or severe pain. 20 tablet 0   losartan (COZAAR) 100 MG tablet Take 1 tablet (100 mg total) by mouth daily. 90 tablet 3   metoprolol tartrate (LOPRESSOR) 100 MG tablet TAKE 1 TABLET(100 MG) BY MOUTH TWICE DAILY 180 tablet 3   rosuvastatin (CRESTOR) 10 MG tablet Take 1 tablet (10 mg total) by mouth daily. 90 tablet 3   TURMERIC PO Take 1 tablet by mouth 2 (two) times daily.     warfarin (COUMADIN) 5 MG tablet TAKE AS DIRECTED BY ANTICOAGULATION CLINIC (Patient  taking differently: Take 7.5 mg by mouth See admin instructions. 7.5mg  on all days EXCEPT 10mg  on Saturday) 150 tablet 1   No current facility-administered medications for this visit.    Allergies  Allergen Reactions   Gluten Meal Other (See Comments)    REACTION: Celiac disease (severe stomach pain) REACTION: Celiac disease (severe stomach pain)    Iodinated Diagnostic Agents Other (See Comments) and Anaphylaxis    V-Fib arrest during cardiac cath suspected d/t contrast dye in 2016 Cardiac arrest Cardiac arrest V-Fib arrest during cardiac cath suspected d/t contrast dye in 2016 Cardiac arrest   Metrizamide Other (See Comments)    V-Fib arrest during cardiac cath suspected d/t contrast dye in 2016   Tizanidine Anxiety    Depression   [place] Other (See Comments)    REACTION: Celiac disease (severe stomach pain)   Morphine And Related Other (See Comments)    REACTION: "REALLY BAD STOMACH PAIN"   Rosuvastatin     Myalgias on 40mg  daily   Apple Diarrhea   Tizanidine Hcl Anxiety    Depression   Wheat Bran Other (See Comments)    Physical Exam  Vital signs: BP 169/36 HR 73 RR 20 SaO2 97% on room air  General: Troy [name] is a pleasant 70 year old male in no acute distress. Head:  Normocephalic and atraumatic. Neck: Trachea is midline.  No carotid bruits.  No JVD.  No adenopathy Heart: Irregularly irregular rhythm.  There is a soft systolic murmur heard at the mid sternal border. Chest: Breath sounds are clear to auscultation Abdomen: Soft nontender Extremities: Warm well perfused with palpable peripheral pulses Neuro: Grossly nonfocal    Diagnostic Tests:  ECHOCARDIOGRAM REPORT         Patient Name:   Troy [name] Date of Exam: 08/02/2021  Medical Rec #:  [id number]      Height:       71.0 in  Accession #:    [id number]     Weight:       242.6 lb  Date of Birth:  04/17/1951     BSA:          2.289 m  Patient Age:    44 years       BP:           144/78 mmHg  Patient Gender: M              HR:           89 bpm.  Exam Location:  Outpatient   Procedure: 2D Echo, Cardiac Doppler and Color Doppler   Indications:    I34.0 Nonrheumatic mitral (valve) insufficiency; I48.1                  Persistent atrial fibrillation     History:        Patient has prior history of Echocardiogram examinations,  most                  recent 07/31/2020. CAD, Arrythmias:Atrial Fibrillation;  Risk                  Factors:Hypertension, Dyslipidemia and Non-Smoker.     Sonographer:    Wilkie Aye RVT  Sonographer#2:  Leavy Cella RDCS  Referring Phys: [place]     1. Poor acoustic windows limit study, make comparisons difficult APical  window is foreshortened in some views. Mild apical, distal anterolateral,  distal inferolateral l hypokinesis. Global longitudinal strain is -8.6%. Marland Kitchen  Left ventricular ejection  fraction, by estimation, is 55%%. The left ventricle has low normal  function. The left ventricle has no regional wall motion abnormalities.  The left ventricular internal cavity size was moderately dilated. There is  mild left ventricular hypertrophy. Left   ventricular diastolic parameters are indeterminate.   2. Right ventricular  systolic function is mildly reduced. The right  ventricular size is normal.   3. Left atrial size was severely dilated.   4. Right atrial size was moderately dilated.   5. Mild to moderate mitral valve regurgitation.   6. The aortic valve is tricuspid. Aortic valve regurgitation is mild.  Mild to moderate aortic valve sclerosis/calcification is present, without  any evidence of aortic stenosis.   7. There is mild dilatation of the ascending aorta, measuring 40 mm.   8. The inferior vena cava is normal in size with greater than 50%  respiratory variability, suggesting right atrial pressure of 3 mmHg.   Comparison(s): EF 55-60%.   FINDINGS   Left Ventricle: Poor acoustic windows limit study, make comparisons  difficult APical window is foreshortened in some views. Mild apical,  distal anterolateral, distal inferolateral l hypokinesis. Global  longitudinal strain is -8.6%. Left ventricular  ejection fraction, by estimation, is 55%%. The left ventricle has low  normal function. The left ventricle has no regional wall motion  abnormalities. The left ventricular internal cavity size was moderately  dilated. There is mild left ventricular  hypertrophy. Left ventricular diastolic parameters are indeterminate.   Right Ventricle: The right ventricular size is normal. Right vetricular  wall thickness was not assessed. Right ventricular systolic function is  mildly reduced.   Left Atrium: Left atrial size was severely dilated.   Right Atrium: Right atrial size was moderately dilated.   Pericardium: There is no evidence of pericardial effusion.   Mitral Valve: There is mild thickening of the mitral valve leaflet(s).  Mild mitral annular calcification. Mild to moderate mitral valve  regurgitation, with posteriorly-directed jet.   Tricuspid Valve: The tricuspid valve is normal in structure. Tricuspid  valve regurgitation is mild.   Aortic Valve: The aortic valve is tricuspid. Aortic valve  regurgitation is  mild. Aortic regurgitation PHT measures 340 msec. Mild to moderate aortic  valve sclerosis/calcification is present, without any evidence of aortic  stenosis. Aortic valve mean  gradient measures 1.0 mmHg. Aortic valve peak gradient measures 2.1 mmHg.   Pulmonic Valve: The pulmonic valve was normal in structure. Pulmonic valve  regurgitation is trivial.   Aorta: The aortic root is normal in size and structure. There is mild  dilatation of the ascending aorta, measuring 40 mm.   Venous: The inferior vena cava is normal in size with greater than 50%  respiratory variability, suggesting right atrial pressure of 3 mmHg.   IAS/Shunts: No atrial level shunt detected by color flow Doppler.      LEFT VENTRICLE  PLAX 2D  LVIDd:         5.96 cm  Diastology  LVIDs:         4.12 cm  LV e' medial:    7.62 cm/s  LV PW:         1.24 cm  LV E/e' medial:  12.0  LV IVS:        1.34 cm  LV e' lateral:   13.60 cm/s  LVOT diam:     2.20 cm  LV E/e' lateral: 6.7  LVOT Area:     3.80 cm  RIGHT VENTRICLE  RV Basal diam:  3.92 cm  RV Mid diam:    3.42 cm  RV S prime:     15.00 cm/s  TAPSE (M-mode): 2.0 cm   LEFT ATRIUM              Index       RIGHT ATRIUM           Index  LA diam:        5.30 cm  2.32 cm/m  RA Area:     27.00 cm  LA Vol (A2C):   144.0 ml 62.91 ml/m RA Volume:   90.50 ml  39.54 ml/m  LA Vol (A4C):   87.7 ml  38.32 ml/m  LA Biplane Vol: 112.0 ml 48.93 ml/m   AORTIC VALVE              PULMONIC VALVE  AV Vmax:      72.40 cm/s  PR End Diast Vel: 4.06 msec  AV Vmean:     52.100 cm/s  AV VTI:       0.142 m  AV Peak Grad: 2.1 mmHg  AV Mean Grad: 1.0 mmHg  AI PHT:       340 msec     AORTA  Ao Asc diam: 4.00 cm   MITRAL VALVE               TRICUSPID VALVE  MV Area (PHT): 4.15 cm    TR Peak grad:   33.9 mmHg  MV Decel Time: 183 msec    TR Vmax:        291.00 cm/s  MR Peak grad: 89.9 mmHg  MR Mean grad: 64.0 mmHg    SHUNTS  MR Vmax:      474.00 cm/s   Systemic Diam: 2.20 cm  MR Vmean:     379.0 cm/s  MV E velocity: 91.55 cm/s   Dorris Carnes MD  Electronically signed by Dorris Carnes MD  Signature Date/Time: 08/02/2021/10:15:07 PM      CLINICAL DATA:  Chest pain.    CT CHEST WITHOUT CONTRAST   TECHNIQUE: Multidetector CT imaging of the chest was performed following the standard protocol without IV contrast.   COMPARISON:  July 27, 2019.   FINDINGS: Cardiovascular: 4.5 cm ascending thoracic aortic aneurysm is noted which is increased compared to prior exam. Atherosclerosis of  thoracic aorta is noted. Mild cardiomegaly. No pericardial effusion. Proximal descending thoracic aorta measures 3.5 cm. Extensive coronary artery calcifications are noted.   Mediastinum/Nodes: No enlarged mediastinal or axillary lymph nodes. Thyroid gland, trachea, and esophagus demonstrate no significant findings.   Lungs/Pleura: Lungs are clear. No pleural effusion or pneumothorax.   Upper Abdomen: No acute abnormality.   Musculoskeletal: No chest wall mass or suspicious bone lesions identified.   IMPRESSION: 4.5 cm ascending thoracic aortic aneurysm which is enlarged compared to prior exam. Recommend semi-annual imaging followup by CTA or MRA and referral to cardiothoracic surgery if not already obtained. This recommendation follows 2010 ACCF/AHA/AATS/ACR/ASA/SCA/SCAI/SIR/STS/SVM Guidelines for the Diagnosis and Management of Patients With Thoracic Aortic Disease. Circulation. 2010; 121: T903-E092. Aortic aneurysm NOS (ICD10-I71.9).   Extensive coronary artery calcifications are noted suggesting coronary artery disease.   Aortic Atherosclerosis (ICD10-I70.0).     Electronically Signed   By: Marijo Conception M.D.   On: 09/05/2021 11:59    Impression / Plan: Troy [name] is a very pleasant 70 year old gentleman with a past medical history of hypertension, persistent atrial fibrillation on Coumadin anticoagulation, coronary disease  status post PTCI and RPDA, dyslipidemia, and mild to moderate mitral insufficiency.  He is has known dilation of his ascending aorta approximately 5 years.  His most recent noncontrast CT was done last month demonstrating ascending aortic dilation at 45 mm.  This was a 5 mm increase compared to his previous noncontrast chest CT obtained and 2020.  His most recent echocardiogram was done about 2 months ago and redemonstrates mild to moderate mitral insufficiency with a posterior directed jet.  There was no significant aortic valve insufficiency.  The aortic valve was described as trileaflet with normal structure. Troy [name] regularly takes metoprolol 100 mg p.o. twice daily and losartan 100 mg p.o. once daily for management of his blood pressure and for rate control for his A. fib.  He is noted to be significantly hypertensive on today's visit as well as at the visit with Dr. Radford Pax last week.  His blood pressure was repeated in the office today about 15 minutes after the initial reading and once very similar to the initial 169/36. Troy [name] has asymptomatic dilation of his ascending aorta with normal aortic valve structure and function.  I explained the importance of blood pressure management in the long-term in order to minimize the risk of further dilation and dissection.  I offered to home with follow-up to the hypertension clinic for close surveillance and modification of his medication regimen if needed.  He is agreeable to this. We will also plan for repeat noncontrast CT of his chest in 6 months and follow-up with [place] in the office. I discussed with him the importance of avoiding strenuous activity, the importance of management of his dyslipidemia, and the importance of avoiding fluoroquinolones.    I spent 30 minutes counseling Troy [name] in regards to his diagnosis, the importance of surveillance and methods to minimize risk for future complications.  Antony Odea, PA-C [facility] [phone]

## 2021-09-20 DIAGNOSIS — I10 Essential (primary) hypertension: Secondary | ICD-10-CM | POA: Diagnosis not present

## 2021-09-20 DIAGNOSIS — E7800 Pure hypercholesterolemia, unspecified: Secondary | ICD-10-CM | POA: Diagnosis not present

## 2021-09-20 DIAGNOSIS — I4821 Permanent atrial fibrillation: Secondary | ICD-10-CM | POA: Diagnosis not present

## 2021-09-20 DIAGNOSIS — I2510 Atherosclerotic heart disease of native coronary artery without angina pectoris: Secondary | ICD-10-CM | POA: Diagnosis not present

## 2021-10-01 ENCOUNTER — Telehealth: Payer: Self-pay | Admitting: Student-PharmD

## 2021-10-01 ENCOUNTER — Encounter: Payer: Self-pay | Admitting: Student-PharmD

## 2021-10-01 DIAGNOSIS — E785 Hyperlipidemia, unspecified: Secondary | ICD-10-CM

## 2021-10-01 MED ORDER — REPATHA SURECLICK 140 MG/ML ~~LOC~~ SOAJ: 140.0000 mg | SUBCUTANEOUS | 11 refills | Status: DC

## 2021-10-01 NOTE — Telephone Encounter (Signed)
Patient returned call. He is not able to afford cost of Leqvio and would like to start PCSK9i instead. Repatha PA already approved. I applied him for [employer] over the phone which is approved. Will send him the information to show his pharmacy via [place] to get Repatha for no charge. Repatha Rx sent in to his preferred pharmacy and he is scheduled for follow up fasting lipid panel and LFTs in January.

## 2021-10-01 NOTE — Telephone Encounter (Addendum)
Received coverage determination for Leqvio which indicates his Medicare Part B covers 80% of the cost once $233 deductible is met, which he has met for this year. He would then be responsible for 20% coinsurance, half of which is covered by his [place]. So in the end he would be responsible for 10% of the cost, which is about $600 per injection.   He does have a Medicare Part D plan which appears to cover both Repatha and Praluent as a tier 3 medication. He does have a >$400 deductible and cost per 30 day supply would be >$40, but his income would qualify him for [employer].   I have submitted a PA for Repatha which is approved. Called the patient to inform him about Leqvio cost and see if he would like to move forward with Repatha instead. If so, can apply him for [employer] to cover cost of deductible and copay.   Unable to reach patient, LVM requesting call back.

## 2021-10-02 ENCOUNTER — Ambulatory Visit (HOSPITAL_BASED_OUTPATIENT_CLINIC_OR_DEPARTMENT_OTHER): Payer: Medicare Other | Admitting: General Practice

## 2021-10-06 ENCOUNTER — Encounter (HOSPITAL_BASED_OUTPATIENT_CLINIC_OR_DEPARTMENT_OTHER): Payer: Self-pay

## 2021-10-06 ENCOUNTER — Emergency Department (HOSPITAL_BASED_OUTPATIENT_CLINIC_OR_DEPARTMENT_OTHER)
Admission: EM | Admit: 2021-10-06 | Discharge: 2021-10-06 | Disposition: A | Discharge status: Home / Self Care | Payer: Medicare Other | Attending: Emergency Medicine | Admitting: Emergency Medicine

## 2021-10-06 ENCOUNTER — Emergency Department (HOSPITAL_BASED_OUTPATIENT_CLINIC_OR_DEPARTMENT_OTHER): Payer: Medicare Other | Admitting: Radiology

## 2021-10-06 ENCOUNTER — Other Ambulatory Visit: Payer: Self-pay

## 2021-10-06 DIAGNOSIS — I517 Cardiomegaly: Secondary | ICD-10-CM | POA: Diagnosis not present

## 2021-10-06 DIAGNOSIS — R059 Cough, unspecified: Secondary | ICD-10-CM | POA: Diagnosis not present

## 2021-10-06 DIAGNOSIS — I2510 Atherosclerotic heart disease of native coronary artery without angina pectoris: Secondary | ICD-10-CM | POA: Insufficient documentation

## 2021-10-06 DIAGNOSIS — Z7902 Long term (current) use of antithrombotics/antiplatelets: Secondary | ICD-10-CM | POA: Diagnosis not present

## 2021-10-06 DIAGNOSIS — Z96653 Presence of artificial knee joint, bilateral: Secondary | ICD-10-CM | POA: Diagnosis not present

## 2021-10-06 DIAGNOSIS — Z20822 Contact with and (suspected) exposure to covid-19: Secondary | ICD-10-CM | POA: Insufficient documentation

## 2021-10-06 DIAGNOSIS — J189 Pneumonia, unspecified organism: Secondary | ICD-10-CM

## 2021-10-06 DIAGNOSIS — J188 Other pneumonia, unspecified organism: Secondary | ICD-10-CM | POA: Insufficient documentation

## 2021-10-06 DIAGNOSIS — I119 Hypertensive heart disease without heart failure: Secondary | ICD-10-CM | POA: Diagnosis not present

## 2021-10-06 DIAGNOSIS — I4819 Other persistent atrial fibrillation: Secondary | ICD-10-CM | POA: Insufficient documentation

## 2021-10-06 DIAGNOSIS — Z79899 Other long term (current) drug therapy: Secondary | ICD-10-CM | POA: Insufficient documentation

## 2021-10-06 LAB — RESP PANEL BY RT-PCR (FLU A&B, COVID) ARPGX2
Influenza A by PCR: NEGATIVE
Influenza B by PCR: NEGATIVE
SARS Coronavirus 2 by RT PCR: NEGATIVE

## 2021-10-06 IMAGING — DX DG CHEST 2V
2 series · 2 of 2 positions shown · non-contrast
Comparison: Chest x-ray [DATE].  CT chest [DATE].

CLINICAL DATA: Cough.

EXAM:
CHEST - 2 VIEW

[chest pa]
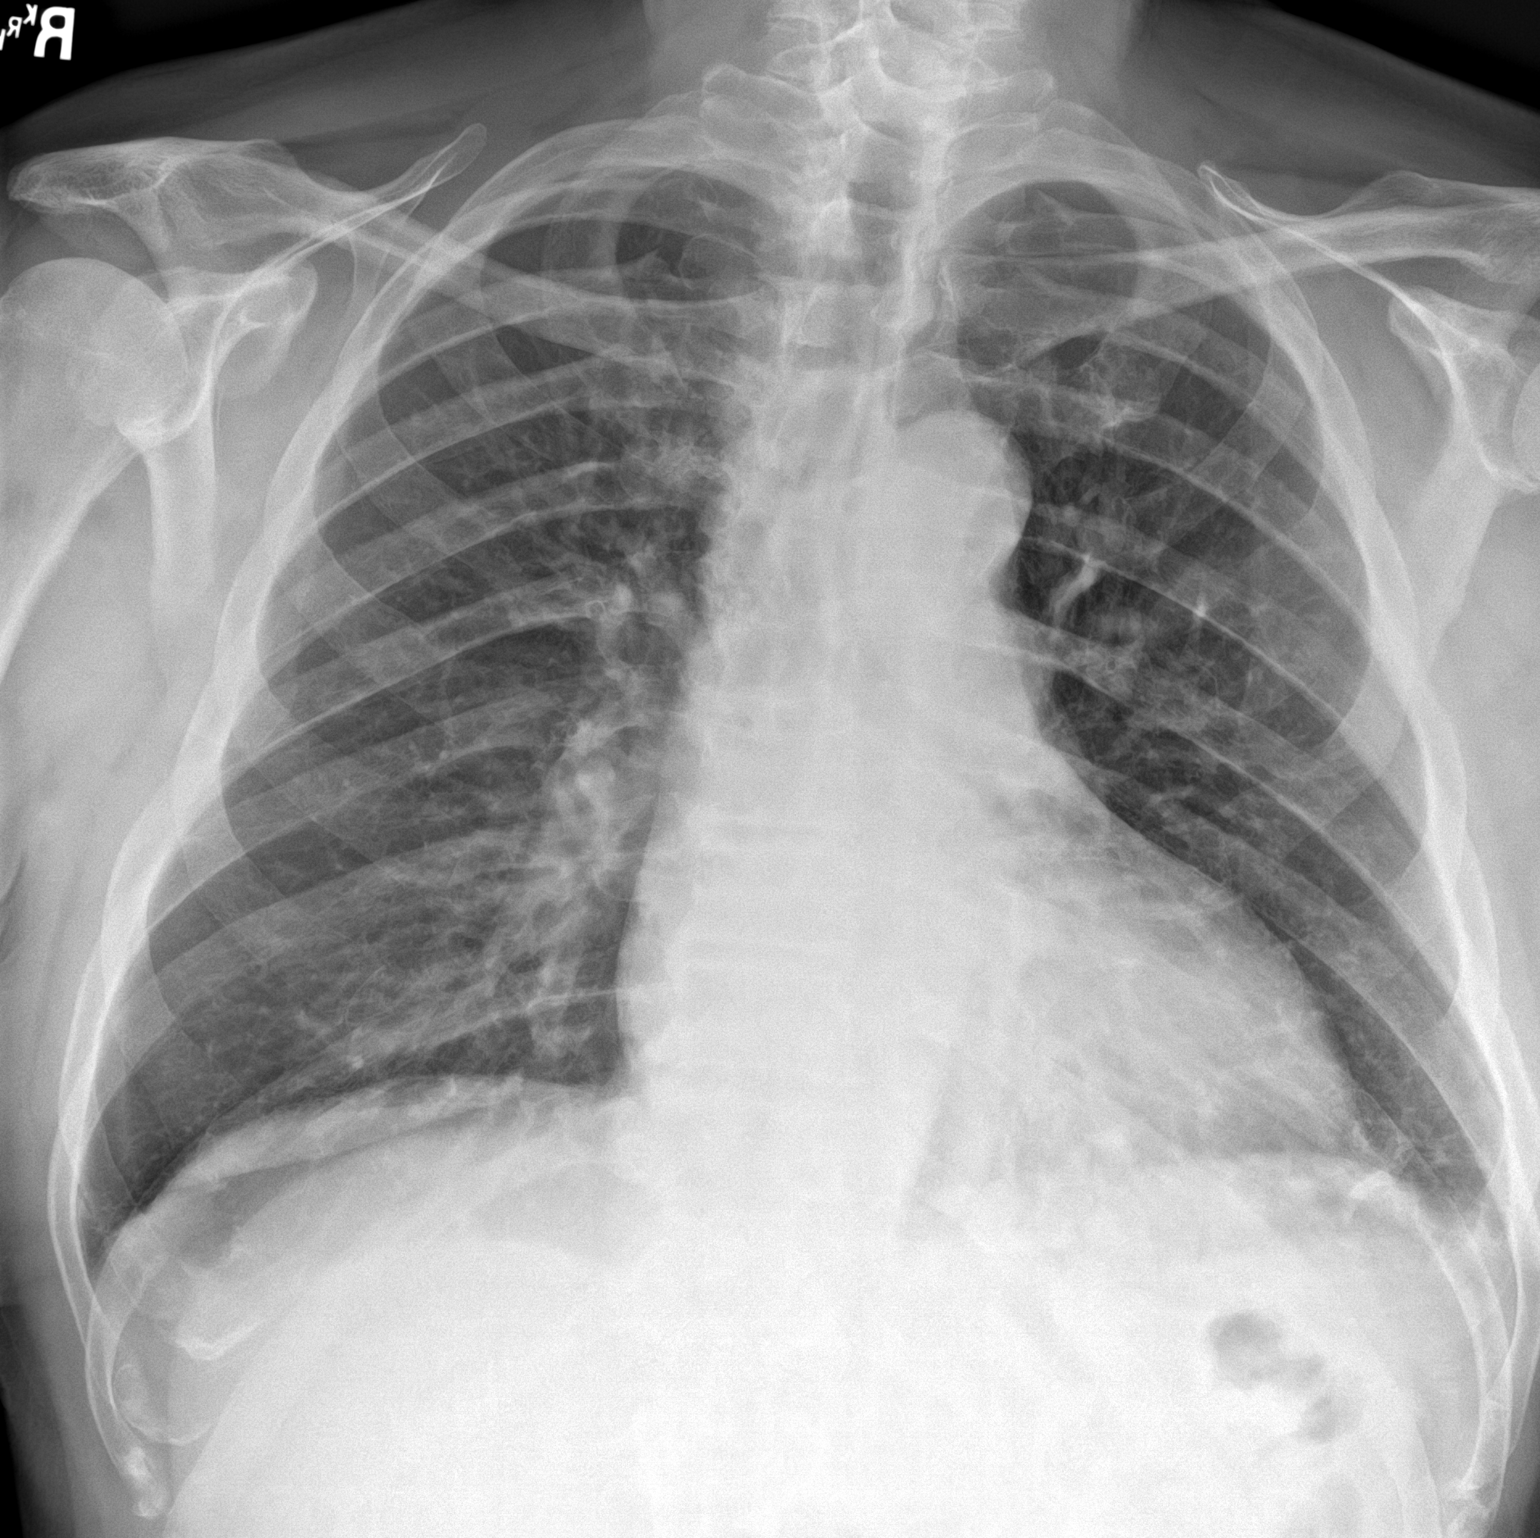

[chest lat]
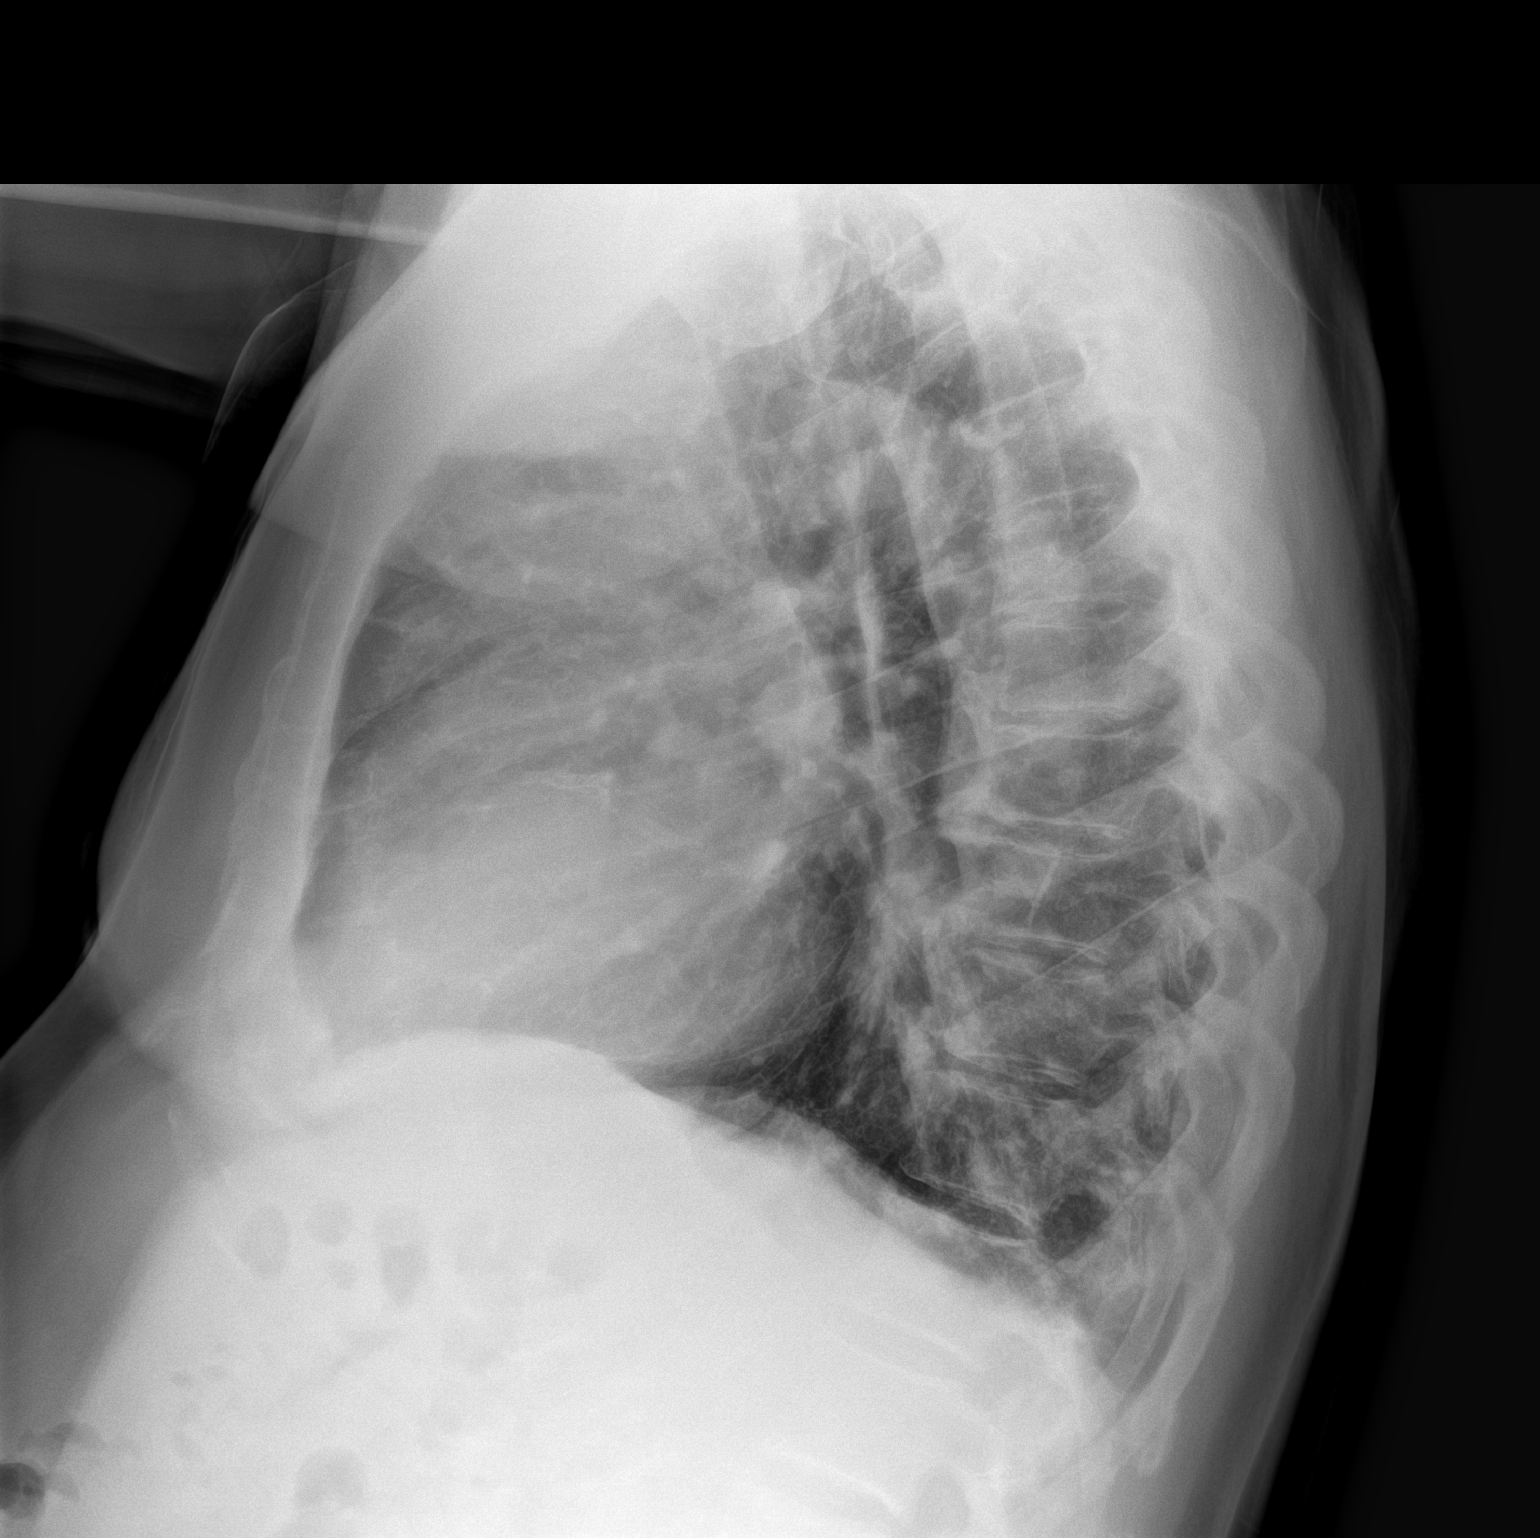

[2 of 2 positions shown; findings below may reference images not displayed]

FINDINGS: The heart is mildly enlarged, unchanged. There are minimal left
lower lobe strandy opacities. The lungs are otherwise clear. There
is no pleural effusion or pneumothorax. No acute fractures are seen.
IMPRESSION: 1. Minimal left lower lobe atelectasis/airspace disease.
2. Stable mild cardiomegaly.

## 2021-10-06 MED ORDER — PREDNISONE 20 MG PO TABS: 40.0000 mg | ORAL_TABLET | Freq: Every day | ORAL | 0 refills | Status: DC

## 2021-10-06 MED ORDER — AZITHROMYCIN 250 MG PO TABS: 250.0000 mg | ORAL_TABLET | Freq: Every day | ORAL | 0 refills | Status: DC

## 2021-10-06 MED ORDER — ALBUTEROL SULFATE HFA 108 (90 BASE) MCG/ACT IN AERS
2.0000 | INHALATION_SPRAY | Freq: Once | RESPIRATORY_TRACT | Status: AC
  Administered 2021-10-06: 18:00:00 2 via RESPIRATORY_TRACT
  Filled 2021-10-06: qty 6.7

## 2021-10-06 NOTE — ED Provider Notes (Signed)
[facility] EMERGENC[facility] DEPT Provider Note   CSN: [id number] Arrival date & time: 10/06/21  1514     History Chief Complaint  Patient presents with   Cough    Troy [name] is a 70 y.o. male.  Patient is a 70 year old male with a history of atrial fibrillation on Coumadin, CAD, ascending aortic dilation and hypertension who is presenting today with complaints of URI symptoms.  Patient has had a cough now for 10 days with brown thick sputum that has been persistent.  The coughing is much worse at night and is he has not been able to get any sleep.  He has had intermittent fevers in the low 100s that do resolve with Tylenol.  He has not had any shortness of breath or chest pain.  He reports he otherwise feels quite well its just the cough will not go away.  He started taking his spouses cefdinir 4 days ago because he was thinking he might have pneumonia.  He reports is not really changed his symptoms significantly.  He does not use tobacco or have a history of diabetes.  He does not use inhalers at home.  He does report that he had been on a blood pressure medicine and had run out a few months ago and just started taking it a few days ago when he noticed that he was having high blood pressure.  The history is provided by the patient.  Cough     Past Medical History:  Diagnosis Date   Arthritis    "knees" (01/02/2017)   Ascending aorta dilatation (HCC)    39mm by CT 2017 and 46mm by CT 2019, 58mm by echo 07/2020 and 29mm by echo 2022   CAD (coronary artery disease)    a. s/p PCI in 2010 in [place]. b. Unstable angina/LHC 05/11/6202 complicated by V fib arrest after contrast injection. Cath 06/19/2015 s/p DES to mid LAD and RPDA. c. 11/2016: chest pain/ abnormal nuclear stress test prompting cath 11/13/16 showing 80% ostial diag, 60% mid RCA, no acute lesions, medical therapy recommended.   Dyslipidemia    Essential hypertension    Lung nodule < 6cm on CT 06/16/2015   Right lung    Mitral regurgitation    mild to moderate by echo 07/2020   OSA on CPAP    Persistent atrial fibrillation ([place])    a. 06/2015 noted to be in new a-fib when arrived with unstable angina. Converted to NSR after being shocked in the cath lab for vfib;  b. 06/2015 Eliquis initiated as PAF noted on event monitor. c. Recurrent atrial fib?flutter 10/2016, issues with recurrent AF requiring multiple DCCVs in 02/2017. Failed sotalol, not candidate for Tikosyn due to cost/QTC, Multaq not felt likely strong enough.   Pulmonary nodule 07/2015   a. 1.5 x 1.2 cm smoothly marginated nodule in the central aspect of the right lower lobe with recommendation correlation with nonemergent PET-CT to exclude a neoplasm , stable by CT 07/2016   Severe left ventricular hypertrophy    Ventricular fibrillation ([place])    a. occured during cath 06/18/2015.    Patient Active Problem List   Diagnosis Date Noted   Acute cystitis 11/17/2020   AKI (acute kidney injury) ([place]) 11/16/2020   Thrombocytopenia ([place]) 11/16/2020   Sepsis ([place]) 11/15/2020   Ascending aorta dilatation (HCC)    OA (osteoarthritis) of knee 02/08/2018   Environmental and seasonal allergies 07/06/2015   Pulmonary nodule 06/27/2015   Mitral regurgitation    Ventricular fibrillation ([place])  Hyperlipidemia 06/18/2015   CAD (coronary artery disease), native coronary artery 06/17/2015   Persistent atrial fibrillation with RVR ([place]), successful TEE DCCV 11/17/16 06/16/2015   Essential (primary) hypertension 04/11/2015   Abnormal presence of protein in urine 04/11/2015    Past Surgical History:  Procedure Laterality Date   CARDIAC CATHETERIZATION N/A 06/18/2015   Procedure: Left Heart Cath and Coronary Angiography;  Surgeon: Burnell Blanks, MD;  Location: [facility];  Service: Cardiovascular;  Laterality: N/A;   CARDIAC CATHETERIZATION N/A 06/19/2015   Procedure: Coronary Stent Intervention;  Surgeon: Peter M Martinique, MD;  Location: [facility];  Service: Cardiovascular;  Laterality: N/A;   CARDIAC CATHETERIZATION N/A 11/13/2016   Procedure: Left Heart Cath and Coronary Angiography;  Surgeon: Lorretta Harp, MD;  Location: [facility];  Service: Cardiovascular;  Laterality: N/A;   CARDIOVERSION N/A 11/17/2016   Procedure: CARDIOVERSION;  Surgeon: Larey Dresser, MD;  Location: [facility];  Service: Cardiovascular;  Laterality: N/A;   CARDIOVERSION N/A 01/03/2017   Procedure: CARDIOVERSION;  Surgeon: Lelon Perla, MD;  Location: [facility];  Service: Cardiovascular;  Laterality: N/A;   CARDIOVERSION N/A 02/05/2017   Procedure: Cardioversion;  Surgeon: Evans Lance, MD;  Location: [facility];  Service: Cardiovascular;  Laterality: N/A;   CARDIOVERSION N/A 02/20/2017   Procedure: Cardioversion;  Surgeon: Evans Lance, MD;  Location: [facility];  Service: Cardiovascular;  Laterality: N/A;   CARTILAGE SURGERY Bilateral    "thumbs"   CATARACT EXTRACTION W/ INTRAOCULAR LENS  IMPLANT, BILATERAL Bilateral    CORONARY ANGIOPLASTY WITH STENT PLACEMENT  ~ 2007   JOINT REPLACEMENT     KNEE ARTHROSCOPY Right    RETINAL DETACHMENT SURGERY Bilateral    "laser thing"   TEE WITHOUT CARDIOVERSION N/A 11/17/2016   Procedure: TRANSESOPHAGEAL ECHOCARDIOGRAM (TEE);  Surgeon: Larey Dresser, MD;  Location: [facility];  Service: Cardiovascular;  Laterality: N/A;   TOTAL KNEE ARTHROPLASTY Left 2000s   TOTAL KNEE ARTHROPLASTY Right 02/08/2018   Procedure: RIGHT TOTAL KNEE ARTHROPLASTY;  Surgeon: Gaynelle Arabian, MD;  Location: [facility];  Service: Orthopedics;  Laterality: Right;  with block       Family History  Problem Relation Age of Onset   Stroke Mother    Emphysema Mother    Lung cancer Mother    Heart attack Father    Drug abuse Father    Heart attack Maternal Grandfather    Heart attack Paternal Grandfather    Diabetes Sister     Social History   Tobacco Use   Smoking status: Never   Smokeless tobacco:  Never   Tobacco comments:    Second-hand exposure through parents  Vaping Use   Vaping Use: Never used  Substance Use Topics   Alcohol use: Yes    Alcohol/week: 2.0 standard drinks    Types: 2 Glasses of wine per week   Drug use: No    Home Medications Prior to Admission medications   Medication Sig Start Date End Date Taking? Authorizing Provider  ALPRAZolam Duanne Moron) 1 MG tablet Take 0.5-1 mg by mouth daily as needed for anxiety. 11/13/20   [provider]  azithromycin (ZITHROMAX) 250 MG tablet Take 1 tablet (250 mg total) by mouth daily. Take first 2 tablets together, then 1 every day until finished. 10/06/21   Blanchie Dessert, MD  Evolocumab (REPATHA SURECLICK) 734 MG/ML SOAJ Inject 140 mg into the skin every 14 (fourteen) days. 10/01/21   Turner,  Eber Hong, MD  Fluorouracil (TOLAK) 4 % CREA Apply 1 application topically at bedtime. 01/02/21   Lavonna Monarch, MD  HYDROcodone-acetaminophen (NORCO/VICODIN) 5-325 MG tablet Take 1-2 tablets by mouth every 6 (six) hours as needed for moderate pain or severe pain. 11/17/20   Mariel Aloe, MD  losartan (COZAAR) 100 MG tablet Take 1 tablet (100 mg total) by mouth daily. 04/02/21   Sueanne Margarita, MD  metoprolol tartrate (LOPRESSOR) 100 MG tablet TAKE 1 TABLET(100 MG) BY MOUTH TWICE DAILY 04/02/21   Sueanne Margarita, MD  predniSONE (DELTASONE) 20 MG tablet Take 2 tablets (40 mg total) by mouth daily. 10/06/21   Blanchie Dessert, MD  rosuvastatin (CRESTOR) 10 MG tablet Take 1 tablet (10 mg total) by mouth daily. 07/11/21   Sueanne Margarita, MD  TURMERIC PO Take 1 tablet by mouth 2 (two) times daily.    [provider]  warfarin (COUMADIN) 5 MG tablet TAKE AS DIRECTED BY ANTICOAGULATION CLINIC Patient taking differently: Take 7.5 mg by mouth See admin instructions. 7.5mg  on all days EXCEPT 10mg  on Saturday 10/10/20   Constance Haw, MD    Allergies    Gluten meal, Iodinated diagnostic agents, Metrizamide, Tizanidine, Whey,  Morphine and related, Rosuvastatin, Apple, Tizanidine hcl, and Wheat bran  Review of Systems   Review of Systems  Respiratory:  Positive for cough.   All other systems reviewed and are negative.  Physical Exam Updated Vital Signs BP (!) 169/132 (BP Location: Left Arm)   Pulse 82   Temp 98.3 F (36.8 C) (Oral)   Resp 16   SpO2 99%   Physical Exam Vitals and nursing note reviewed.  Constitutional:      General: He is not in acute distress.    Appearance: Normal appearance. He is well-developed and normal weight.  HENT:     Head: Normocephalic and atraumatic.     Right Ear: Tympanic membrane normal.     Left Ear: Tympanic membrane normal.     Nose: Nose normal.     Mouth/Throat:     Mouth: Mucous membranes are moist.  Eyes:     Conjunctiva/sclera: Conjunctivae normal.     Pupils: Pupils are equal, round, and reactive to light.  Cardiovascular:     Rate and Rhythm: Normal rate. Rhythm irregularly irregular.     Heart sounds: No murmur heard. Pulmonary:     Effort: Pulmonary effort is normal. No respiratory distress.     Breath sounds: Normal breath sounds. No wheezing or rales.  Abdominal:     General: There is no distension.     Palpations: Abdomen is soft.     Tenderness: There is no abdominal tenderness. There is no guarding or rebound.  Musculoskeletal:        General: No tenderness. Normal range of motion.     Cervical back: Normal range of motion and neck supple.     Right lower leg: No edema.     Left lower leg: No edema.  Skin:    General: Skin is warm and dry.     Findings: No erythema or rash.  Neurological:     Mental Status: He is alert and oriented to person, place, and time. Mental status is at baseline.  Psychiatric:        Mood and Affect: Mood normal.        Behavior: Behavior normal.    ED Results / Procedures / Treatments   Labs (all labs ordered are listed, but only  abnormal results are displayed) Labs Reviewed  RESP PANEL BY RT-PCR (FLU  A&B, COVID) [id number]    EKG None  Radiology DG Chest 2 View  Result Date: 10/06/2021 CLINICAL DATA:  Cough. EXAM: CHEST - 2 VIEW COMPARISON:  Chest x-ray 11/15/2020.  CT chest 09/03/2021. FINDINGS: The heart is mildly enlarged, unchanged. There are minimal left lower lobe strandy opacities. The lungs are otherwise clear. There is no pleural effusion or pneumothorax. No acute fractures are seen. IMPRESSION: 1. Minimal left lower lobe atelectasis/airspace disease. 2. Stable mild cardiomegaly. Electronically Signed   By: Ronney Asters M.D.   On: 10/06/2021 16:59    Procedures Procedures   Medications Ordered in ED Medications  albuterol (VENTOLIN HFA) 108 (90 Base) MCG/ACT inhaler 2 puff (2 puffs Inhalation Given 10/06/21 1741)    ED Course  I have reviewed the triage vital signs and the nursing notes.  Pertinent labs & imaging results that were available during my care of the patient were reviewed by me and considered in my medical decision making (see chart for details).    MDM Rules/Calculators/A&P                           70 year old male presenting today with persistent cough and sputum production with occasional fevers.  He is well-appearing on exam satting 99% on room air and in no acute distress.  No wheezing currently but he reports he does have a history of COPD but does not use tobacco products and does not use any inhalers at home.  Concern for persistent viral etiology/bronchitis versus pneumonia.  Patient's COVID and flu are negative today.  Chest x-ray shows minimal left lower lobe atelectasis versus airspace disease and stable mild cardiomegaly.  He has no evidence of fluid overload today and low suspicion for PE.  Because he has been on a cephalosporin he can complete the last dose which would be 5 days of cefdinir but will add on azithromycin for atypicals.  We will also do a course of prednisone and patient was given an inhaler here.  Stressed importance of follow-up with  his PCP if he does not resolve and also to continue watching his blood pressure and follow-up with them if it does not improve now that he is back on his medication.  Patient does take Coumadin and he will call the Coumadin clinic on Monday for possible dosage change and has an appointment to see them on Friday.  MDM   Amount and/or Complexity of Data Reviewed Clinical lab tests: ordered and reviewed Tests in the radiology section of CPT: ordered and reviewed Independent visualization of images, tracings, or specimens: yes    Final Clinical Impression(s) / ED Diagnoses Final diagnoses:  Community acquired pneumonia, unspecified laterality    Rx / DC Orders ED Discharge Orders          Ordered    predniSONE (DELTASONE) 20 MG tablet  Daily,   Status:  Discontinued        10/06/21 1737    azithromycin (ZITHROMAX) 250 MG tablet  Daily,   Status:  Discontinued        10/06/21 1737    azithromycin (ZITHROMAX) 250 MG tablet  Daily        10/06/21 1738    predniSONE (DELTASONE) 20 MG tablet  Daily        10/06/21 1738             Blanchie Dessert, MD 10/06/21  1749  

## 2021-10-06 NOTE — ED Triage Notes (Addendum)
Pt presents with cough, congestion, nasal drainage and no sleep d/t increase phlegm x10 days. Taking spouse 300mg   Cefdinir, with no relief. Spouse was recently dx with sinus infection. Pt also reports intermittent fever that improves with tylenol.  Pt hypertensive in triage, reports he lost his Losartan approx 2 months ago and started back on it 3 days ago. Pt also reports having high blood pressure when he goes to the dr

## 2021-10-07 NOTE — ED Notes (Signed)
Opened pts chart at his request to clarify when he is supposed to start the Azythromycin. Verbalizes to finih present antibiotic then start the new prescription.

## 2021-10-10 DIAGNOSIS — I4891 Unspecified atrial fibrillation: Secondary | ICD-10-CM | POA: Diagnosis not present

## 2021-10-10 DIAGNOSIS — J40 Bronchitis, not specified as acute or chronic: Secondary | ICD-10-CM | POA: Diagnosis not present

## 2021-10-10 DIAGNOSIS — Z7901 Long term (current) use of anticoagulants: Secondary | ICD-10-CM | POA: Diagnosis not present

## 2021-10-10 DIAGNOSIS — I10 Essential (primary) hypertension: Secondary | ICD-10-CM | POA: Diagnosis not present

## 2021-10-14 ENCOUNTER — Telehealth (HOSPITAL_COMMUNITY): Payer: Self-pay | Admitting: *Deleted

## 2021-10-14 DIAGNOSIS — H33321 Round hole, right eye: Secondary | ICD-10-CM | POA: Diagnosis not present

## 2021-10-14 DIAGNOSIS — H43391 Other vitreous opacities, right eye: Secondary | ICD-10-CM | POA: Diagnosis not present

## 2021-10-14 NOTE — Telephone Encounter (Signed)
Attempted to call patient regarding upcoming cardiac MRI appointment. Left message on voicemail with name and callback number  Addaleigh Nicholls RN Navigator Cardiac Imaging [facility] [phone] Office [phone] Cell  

## 2021-10-14 NOTE — Telephone Encounter (Signed)
Patient returning call regarding upcoming cardiac imaging study; pt verbalizes understanding of appt date/time, parking situation and where to check in, and verified current allergies; name and call back number provided for further questions should they arise  Troy Clement RN Navigator Cardiac Imaging [facility] [phone] office [phone] cell  Patient states he will take Valium for his cardiac MRI.  He will have a rides and state he has a knee replacement surgery in the past. He is aware to arrive at 11:30am for his scan.

## 2021-10-16 ENCOUNTER — Ambulatory Visit (HOSPITAL_COMMUNITY): Admission: RE | Admit: 2021-10-16 | Payer: Medicare Other | Source: Ambulatory Visit

## 2021-10-16 ENCOUNTER — Ambulatory Visit (HOSPITAL_COMMUNITY)
Admission: RE | Admit: 2021-10-16 | Discharge: 2021-10-16 | Disposition: A | Discharge status: Home / Self Care | Payer: Medicare Other | Source: Ambulatory Visit | Attending: Cardiology | Admitting: Cardiology

## 2021-10-16 ENCOUNTER — Other Ambulatory Visit: Payer: Self-pay

## 2021-10-16 DIAGNOSIS — G4733 Obstructive sleep apnea (adult) (pediatric): Secondary | ICD-10-CM | POA: Insufficient documentation

## 2021-10-16 DIAGNOSIS — I4821 Permanent atrial fibrillation: Secondary | ICD-10-CM | POA: Diagnosis not present

## 2021-10-16 DIAGNOSIS — I517 Cardiomegaly: Secondary | ICD-10-CM | POA: Diagnosis not present

## 2021-10-16 DIAGNOSIS — E7800 Pure hypercholesterolemia, unspecified: Secondary | ICD-10-CM | POA: Insufficient documentation

## 2021-10-16 DIAGNOSIS — R931 Abnormal findings on diagnostic imaging of heart and coronary circulation: Secondary | ICD-10-CM | POA: Diagnosis not present

## 2021-10-16 DIAGNOSIS — I712 Thoracic aortic aneurysm, without rupture, unspecified: Secondary | ICD-10-CM | POA: Diagnosis not present

## 2021-10-16 DIAGNOSIS — I10 Essential (primary) hypertension: Secondary | ICD-10-CM | POA: Insufficient documentation

## 2021-10-16 DIAGNOSIS — I2510 Atherosclerotic heart disease of native coronary artery without angina pectoris: Secondary | ICD-10-CM | POA: Diagnosis not present

## 2021-10-16 IMAGING — MR MR CARD MORPHOLOGY WO/W CM
45 of 48 series · 45 of 48 positions shown · IV contrast (Contrast agent)
Comparison: none

CLINICAL DATA: Clinical question of aortic regurgitation
Study assumes HCT of 45 and BSA of 2.29 m2.

EXAM:
CARDIAC MRI
TECHNIQUE: The patient was scanned on a 1.5 Tesla GE magnet. A dedicated
cardiac coil was used. Functional imaging was done using Fiesta
sequences. [DATE], and 4 chamber views were done to assess for RWMA's.
Modified SANTIS rule using a short axis stack was used to
calculate an ejection fraction on a dedicated work station using
Circle software. The patient received 10 cc of Gadavist. After 10
minutes inversion recovery sequences were used to assess for
infiltration and scar tissue.
CONTRAST:  10 cc  of Gadavist

[Series 4: [id number] · axial · 8.0mm · 1.56mm/px · 1 of 20 slices shown]
[im 1/20]
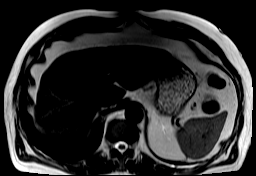

[Series 8: bSSFP · sagittal · 8.0mm · 1.61mm/px · 1 of 25 slices shown (1 of 24)]
[im 1/25]
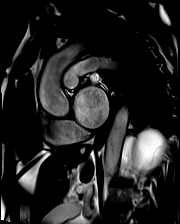

[Series 9: bSSFP · sagittal · 8.0mm · 1.61mm/px · 1 of 25 slices shown (2 of 24)]
[im 1/25]
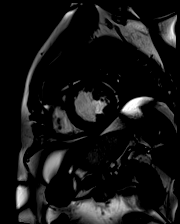

[Series 10: bSSFP · sagittal · 8.0mm · 1.61mm/px · 1 of 14 slices shown (3 of 24)]
[im 1/14]
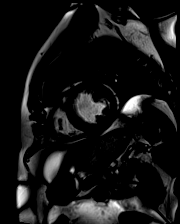

[Series 11: bSSFP · sagittal · 8.0mm · 1.61mm/px · 1 of 25 slices shown (4 of 24)]
[im 1/25]
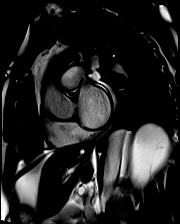

[Series 12: bSSFP · sagittal · 8.0mm · 1.61mm/px · 1 of 25 slices shown (5 of 24)]
[im 1/25]
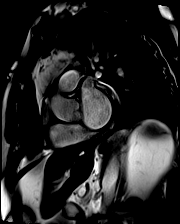

[Series 13: bSSFP · sagittal · 8.0mm · 1.61mm/px · 1 of 25 slices shown (6 of 24)]
[im 1/25]
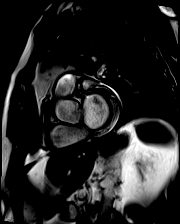

[Series 14: bSSFP · sagittal · 8.0mm · 1.61mm/px · 1 of 25 slices shown (7 of 24)]
[im 1/25]
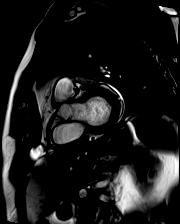

[Series 15: bSSFP · sagittal · 8.0mm · 1.61mm/px · 1 of 25 slices shown (8 of 24)]
[im 1/25]
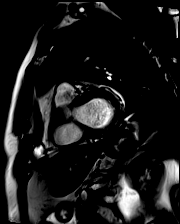

[Series 16: bSSFP · sagittal · 8.0mm · 1.61mm/px · 1 of 25 slices shown (9 of 24)]
[im 1/25]
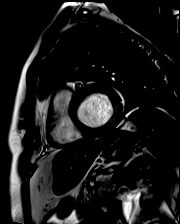

[Series 17: bSSFP · sagittal · 8.0mm · 1.61mm/px · 1 of 25 slices shown (10 of 24)]
[im 1/25]
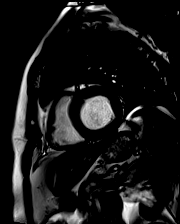

[Series 18: bSSFP · sagittal · 8.0mm · 1.61mm/px · 1 of 25 slices shown (11 of 24)]
[im 1/25]
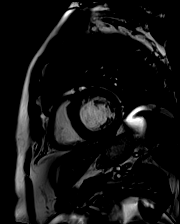

[Series 19: bSSFP · sagittal · 8.0mm · 1.61mm/px · 1 of 25 slices shown (12 of 24)]
[im 1/25]
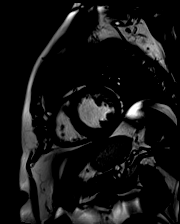

[Series 20: bSSFP · sagittal · 8.0mm · 1.61mm/px · 1 of 25 slices shown (13 of 24)]
[im 1/25]
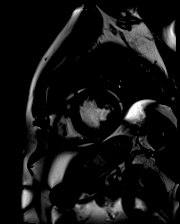

[Series 21: bSSFP · sagittal · 8.0mm · 1.61mm/px · 1 of 25 slices shown (14 of 24)]
[im 1/25]
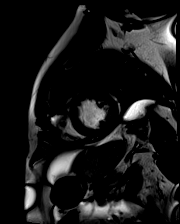

[Series 22: bSSFP · sagittal · 8.0mm · 1.61mm/px · 1 of 25 slices shown (15 of 24)]
[im 1/25]
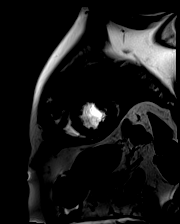

[Series 23: bSSFP · sagittal · 8.0mm · 1.61mm/px · 1 of 25 slices shown (16 of 24)]
[im 1/25]
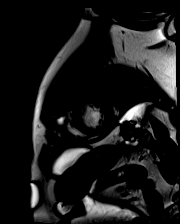

[Series 24: bSSFP · sagittal · 8.0mm · 1.61mm/px · 1 of 25 slices shown (17 of 24)]
[im 1/25]
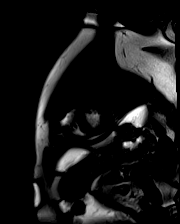

[Series 25: bSSFP · sagittal · 8.0mm · 1.61mm/px · 1 of 25 slices shown (18 of 24)]
[im 1/25]
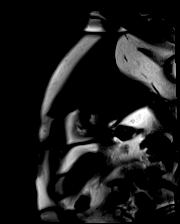

[Series 26: bSSFP · sagittal · 8.0mm · 1.61mm/px · 1 of 25 slices shown (19 of 24)]
[im 1/25]
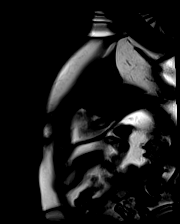

[Series 27: bSSFP · sagittal · 8.0mm · 1.61mm/px · 1 of 25 slices shown (20 of 24)]
[im 1/25]
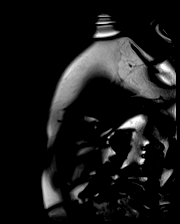

[Series 28: bSSFP · sagittal · 8.0mm · 1.61mm/px · 1 of 25 slices shown (21 of 24)]
[im 1/25]
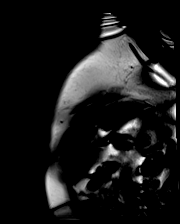

[Series 29: (id)_long_t1 · sagittal · 8.0mm · 1.56mm/px · 1 of 24 slices shown]
[im 1/24]
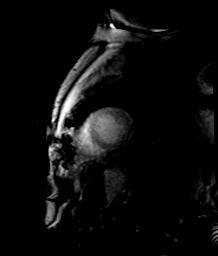

[Series 30: (id)_long_t1_moco · sagittal · 8.0mm · 1.56mm/px · 1 of 24 slices shown]
[im 1/24]
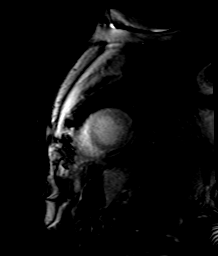

[Series 31: (id)_long_t1_moco_t1 · sagittal · 8.0mm · 1.56mm/px · 1 of 6 slices shown]
[im 1/6]
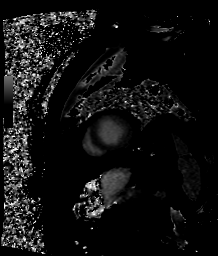

[Series 33: (id)_trufi · sagittal · 8.0mm · 2.08mm/px · 1 of 9 slices shown]
[im 1/9]
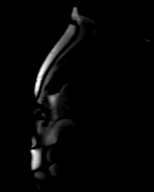

[Series 34: (id)_trufi_moco · sagittal · 8.0mm · 2.08mm/px · 1 of 9 slices shown]
[im 1/9]
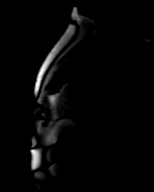

[Series 35: (id)_trufi_moco_t2 · sagittal · 8.0mm · 2.08mm/px · 1 of 3 slices shown]
[im 1/3]
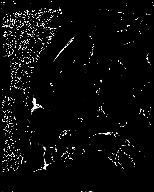

[Series 37: bSSFP · axial · 6.0mm · 1.41mm/px · 1 of 25 slices shown (22 of 24)]
[im 1/25]
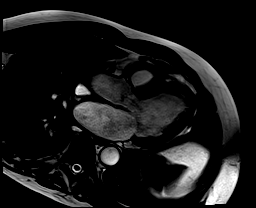

[Series 38: bSSFP · coronal · 6.0mm · 1.48mm/px · 1 of 25 slices shown (23 of 24)]
[im 1/25]
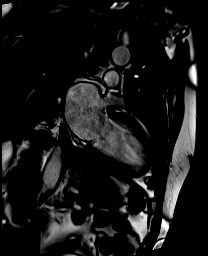

[Series 39: bSSFP · axial · 6.0mm · 1.41mm/px · 1 of 25 slices shown (24 of 24)]
[im 1/25]
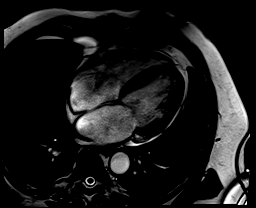

[Series 40: cine_trufi_cs_rt_short axis · sagittal · 8.0mm · 1.73mm/px · 1 of 12 slices shown (1 of 14)]
[im 1/12]
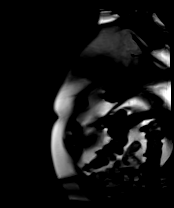

[Series 40: cine_trufi_cs_rt_short axis · sagittal · 8.0mm · 1.73mm/px · 1 of 12 slices shown (2 of 14)]
[im 1/12]
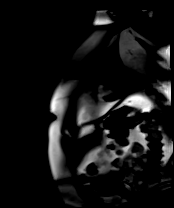

[Series 40: cine_trufi_cs_rt_short axis · sagittal · 8.0mm · 1.73mm/px · 1 of 12 slices shown (3 of 14)]
[im 1/12]
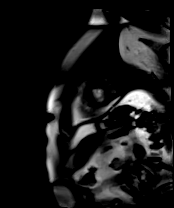

[Series 40: cine_trufi_cs_rt_short axis · sagittal · 8.0mm · 1.73mm/px · 1 of 12 slices shown (4 of 14)]
[im 1/12]
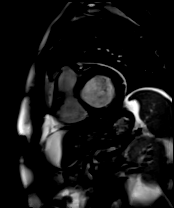

[Series 40: cine_trufi_cs_rt_short axis · sagittal · 8.0mm · 1.73mm/px · 1 of 12 slices shown (5 of 14)]
[im 1/12]
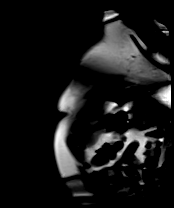

[Series 40: cine_trufi_cs_rt_short axis · sagittal · 8.0mm · 1.73mm/px · 1 of 12 slices shown (6 of 14)]
[im 1/12]
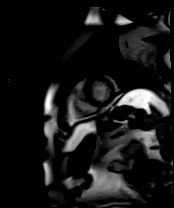

[Series 40: cine_trufi_cs_rt_short axis · sagittal · 8.0mm · 1.73mm/px · 1 of 12 slices shown (7 of 14)]
[im 1/12]
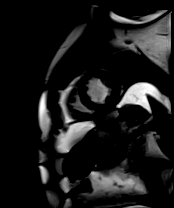

[Series 40: cine_trufi_cs_rt_short axis · sagittal · 8.0mm · 1.73mm/px · 1 of 12 slices shown (8 of 14)]
[im 1/12]
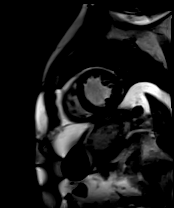

[Series 40: cine_trufi_cs_rt_short axis · sagittal · 8.0mm · 1.73mm/px · 1 of 12 slices shown (9 of 14)]
[im 1/12]
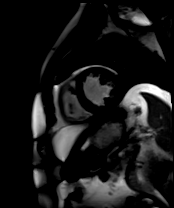

[Series 40: cine_trufi_cs_rt_short axis · sagittal · 8.0mm · 1.73mm/px · 1 of 12 slices shown (10 of 14)]
[im 1/12]
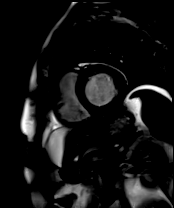

[Series 40: cine_trufi_cs_rt_short axis · sagittal · 8.0mm · 1.73mm/px · 1 of 12 slices shown (11 of 14)]
[im 1/12]
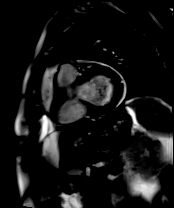

[Series 40: cine_trufi_cs_rt_short axis · sagittal · 8.0mm · 1.73mm/px · 1 of 12 slices shown (12 of 14)]
[im 1/12]
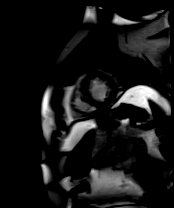

[Series 40: cine_trufi_cs_rt_short axis · sagittal · 8.0mm · 1.73mm/px · 1 of 12 slices shown (13 of 14)]
[im 1/12]
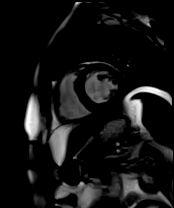

[Series 40: cine_trufi_cs_rt_short axis · sagittal · 8.0mm · 1.73mm/px · 1 of 12 slices shown (14 of 14)]
[im 1/12]
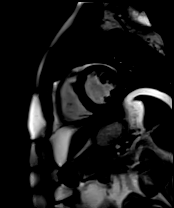

[45 of 48 positions shown; findings below may reference images not displayed]

FINDINGS: 1. Normal left ventricular size, with LVEDD 51 mm, and LVEDVi 91
mL/m2.

Normal left ventricular thickness, with intraventricular septal
thickness of 10 mm, posterior wall thickness of 10 mm, and
myocardial mass index of 76 g/m2.

Moderate left ventricular systolic dysfunction (LVEF = 33%). There
are no regional wall motion abnormalities- global hypokinesis.

Left ventricular parametric mapping notable for normal ECV signal
and T2.

There is late gadolinium enhancement in the left ventricular
myocardium. Posterior RV insertion point LGE, basal mid myocardial
lateral and inferolateral LGE, mid inferior epicardial LGE. 5
Standard deviation assessment used.

2. Normal right ventricular size with RVEDVI 64 mL/m2.

Normal right ventricular thickness.

Moderate right ventricular systolic dysfunction (RVEF =34%). There
are no regional wall motion abnormalities or aneurysms.

3.  Bilateral moderate atrial dilation.

4.  Great vessel evaluation:

Main pulmonary artery- mildly dilated 32 mm

Aortic Root- 38 mm

Sinus of Valsalva- 39 mm at largest (Left to non cusp)

Ascending Aorta 42 mm mildly dilated

Aortic Arch- 35 mm

Descending Aorta- 28 mm

5. Valve assessment:

Aortic Valve: Tri-leaflet Aortic valve. Qualitatively mild
regurgitation.

Pulmonic Valve: Qualitatively no significant regurgitation.

Tricuspid Valve: Qualitatively mild tricuspid regurgitation

Mitral Valve: Regurgitant fraction 26% by LV-RV stroke volume.
Though decreased accuracy in the setting of multiple valve disease,
there is mild to moderate mitral regurgitation.

6.  Normal pericardium.  No pericardial effusion.

7. Grossly, no extracardiac findings. Recommended dedicated study if
concerned for non-cardiac pathology.

8. Breathhold Artifacts noted. This decreased the sensitivity of
quantitative ejection fraction.
IMPRESSION: LV and RV function are moderately reduced.

Mild thoracic aortic aneurysm.

## 2021-10-16 IMAGING — MR MR MRA CHEST W/ OR W/O CM
16 of 17 series · 39 of 40 positions shown · IV contrast (gadavist)
Comparison: [DATE]

CLINICAL DATA: 69-year-old male with history of

EXAM:
MRA Chest with and without contrast
TECHNIQUE: Angiographic images of the chest were obtained using MRA technique
without and with intravenous contrast.
CONTRAST:  Ten mL Gadavist, intravenous

[Series 4: t2_haste_db_tra_bh · axial · 8.0mm · 1.56mm/px · 1 of 20 slices shown]
[im 1/20]
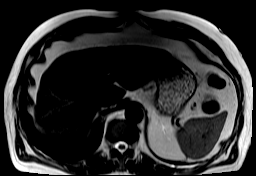

[Series 41: (person_name)_(person_name)_(person_name) · sagittal · 8.0mm · 1.79mm/px · 1 of 25 slices shown (1 of 8)]
[im 1/25]
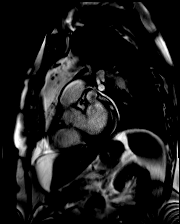

[Series 41: (person_name)_(person_name)_(person_name) · sagittal · 8.0mm · 1.79mm/px · 1 of 25 slices shown (2 of 8)]
[im 1/25]
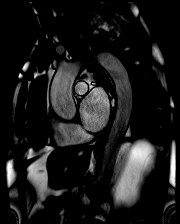

[Series 41: (person_name)_(person_name)_(person_name) · sagittal · 8.0mm · 1.79mm/px · 1 of 25 slices shown (3 of 8)]
[im 1/25]
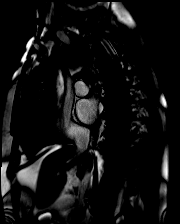

[Series 41: (person_name)_(person_name)_(person_name) · sagittal · 8.0mm · 1.79mm/px · 1 of 25 slices shown (4 of 8)]
[im 1/25]
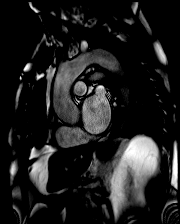

[Series 41: (person_name)_(person_name)_(person_name) · sagittal · 8.0mm · 1.79mm/px · 1 of 25 slices shown (5 of 8)]
[im 1/25]
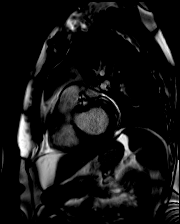

[Series 41: (person_name)_(person_name)_(person_name) · sagittal · 8.0mm · 1.79mm/px · 1 of 25 slices shown (6 of 8)]
[im 1/25]
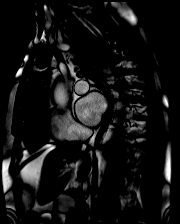

[Series 41: (person_name)_(person_name)_(person_name) · sagittal · 8.0mm · 1.79mm/px · 1 of 25 slices shown (7 of 8)]
[im 1/25]
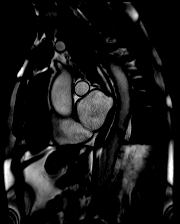

[Series 41: (person_name)_(person_name)_(person_name) · sagittal · 8.0mm · 1.79mm/px · 1 of 25 slices shown (8 of 8)]
[im 1/25]
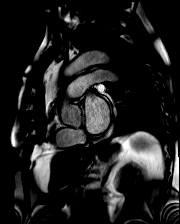

[Series 42: T1 dynamic · axial · non-contrast · 3.3mm · 1.18mm/px · z∈[-251,+9]mm · 2 of 80 slices shown]
[im 1/80]
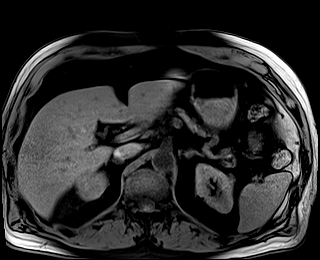
[im 80/80]
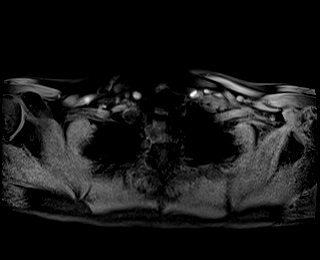

[Series 43: angio_fl3d_sag_pre · sagittal · 1.1mm · 1.17mm/px · 5 of 112 slices shown]
[im 1/112]
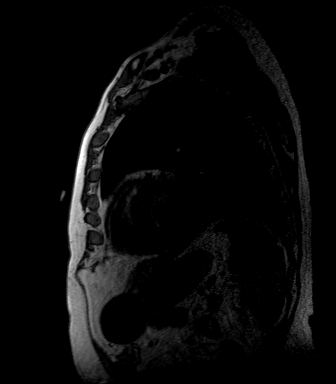
[im 28/112]
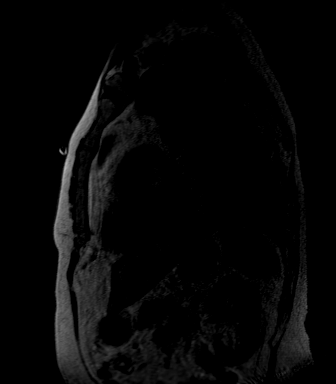
[im 56/112]
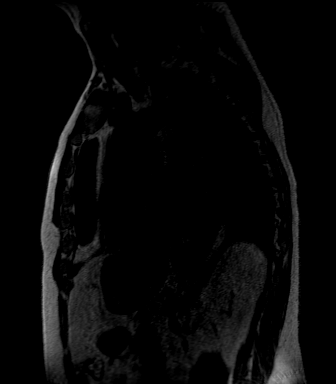
[im 84/112]
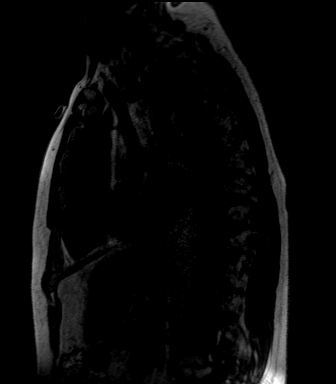
[im 112/112]
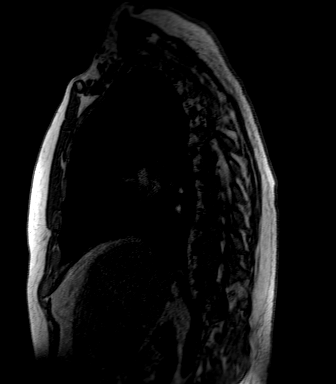

[Series 45: candy cane ce-arterial · sagittal · arterial · 1.1mm · 1.17mm/px · 5 of 112 slices shown]
[im 1/112]
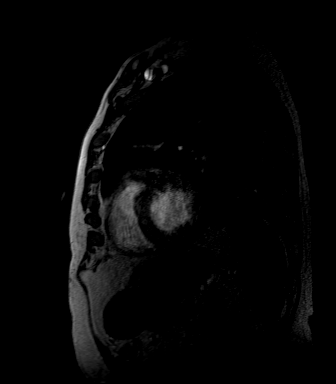
[im 28/112]
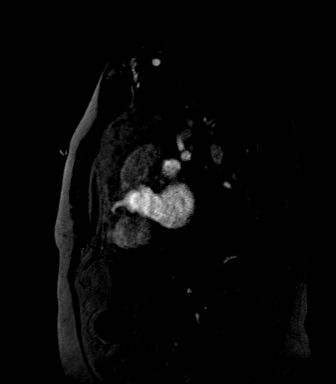
[im 56/112]
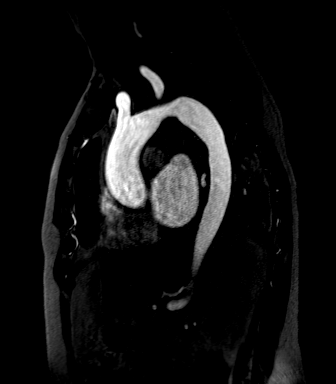
[im 84/112]
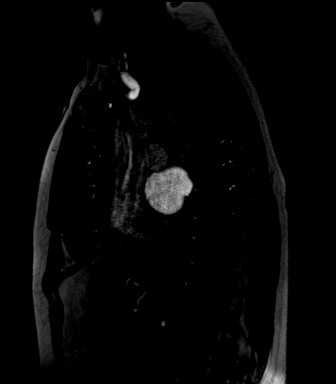
[im 112/112]
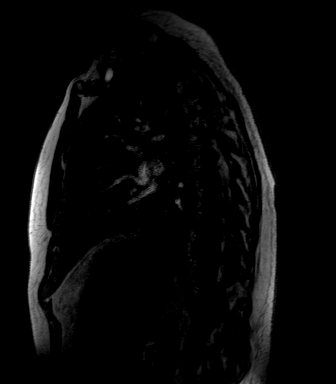

[Series 46: candy cane ce-arterial_sub · sagittal · 1.1mm · 1.17mm/px · 5 of 112 slices shown]
[im 1/112]
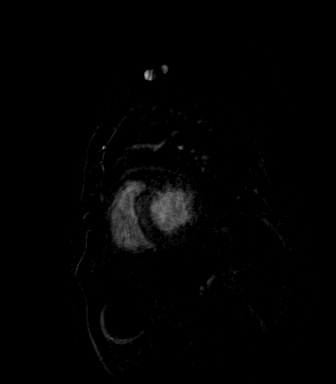
[im 28/112]
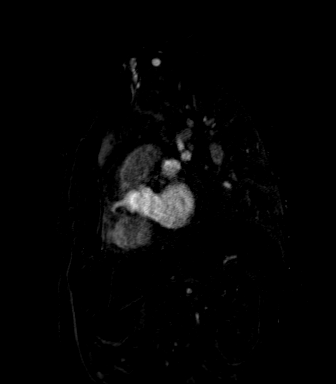
[im 56/112]
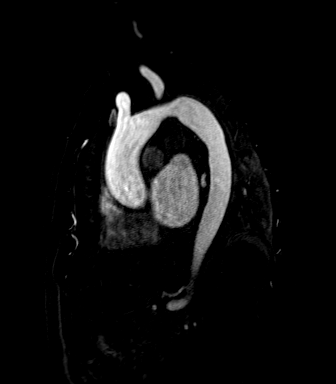
[im 84/112]
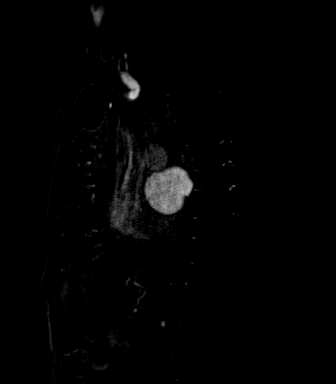
[im 112/112]
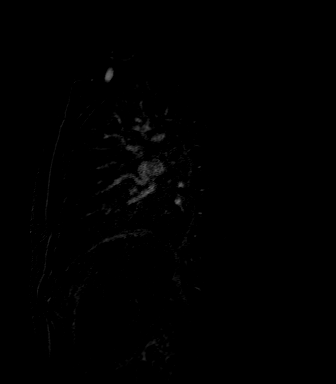

[Series 48: candy cane ce-venous · sagittal · portal-venous · 1.1mm · 1.17mm/px · 5 of 112 slices shown]
[im 1/112]
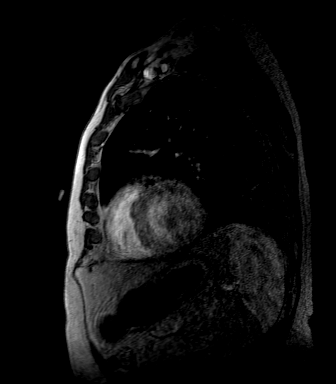
[im 28/112]
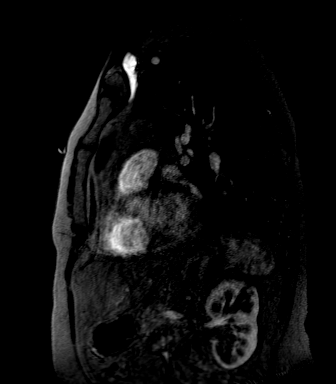
[im 56/112]
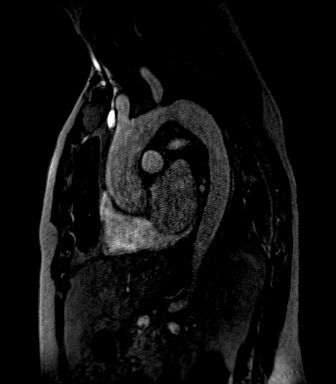
[im 84/112]
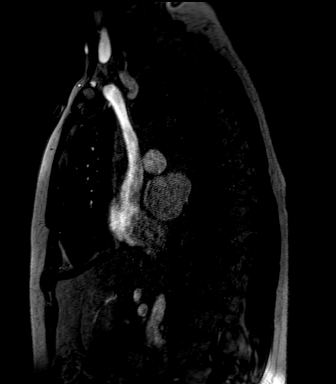
[im 112/112]
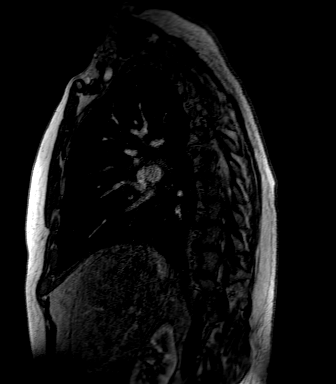

[Series 49: candy cane ce-venous_sub · sagittal · 1.1mm · 1.17mm/px · 5 of 112 slices shown]
[im 1/112]
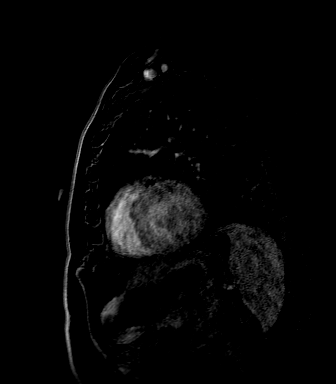
[im 28/112]
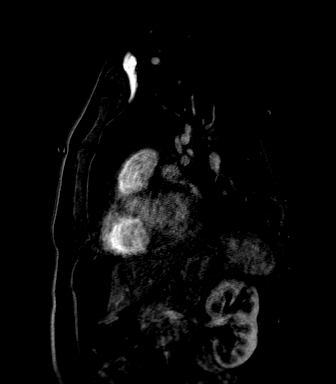
[im 56/112]
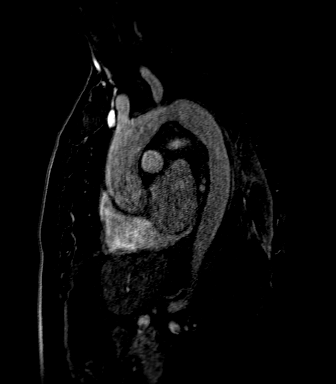
[im 84/112]
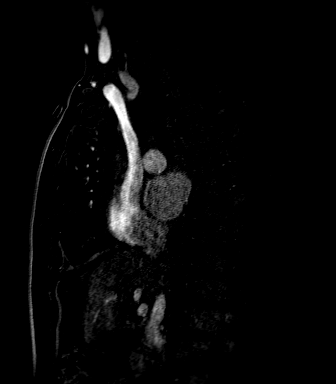
[im 112/112]
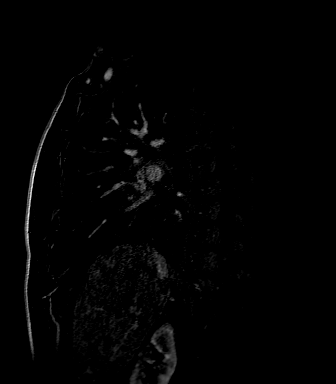

[Series 51: T1 dynamic post-contrast · axial · 3.3mm · 1.18mm/px · z∈[-251,+9]mm · 3 of 80 slices shown]
[im 1/80]
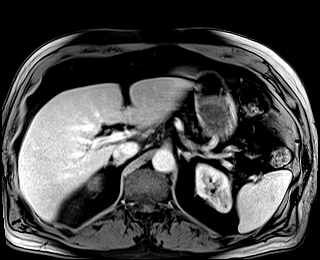
[im 40/80]
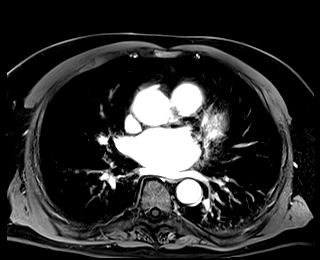
[im 80/80]
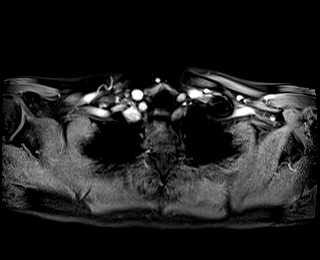

[39 of 40 positions shown; findings below may reference images not displayed]

FINDINGS: Cardiovascular: Preferential opacification of the thoracic aorta. No
evidence of thoracic aortic dissection. Normal heart size. No
pericardial effusion. Mild global cardiomegaly size. No pericardial
effusion.

Sinues of Valsalva: 39 mm

Sinotubular Junction: 35 mm

Ascending Aorta: 42 mm

Aortic Arch: 35 mm

Descending aorta: 28 mm at the level of the carina

Branch vessels: Conventional branching pattern. No significant
atherosclerotic changes.

Main pulmonary artery: 32 mm ,unchanged. No evidence of central
pulmonary embolism.

Pulmonary veins: No anomalous pulmonary venous return. No evidence
of left atrial appendage thrombus.

Upper abdominal vasculature: Within normal limits.

Mediastinum/Nodes: No enlarged mediastinal, hilar, or axillary lymph
nodes. Thyroid gland, trachea, and esophagus demonstrate no
significant findings.

Lungs/Pleura: No focal consolidations. No suspicious pulmonary
nodules. No pleural effusion or pneumothorax.

Upper Abdomen: The visualized upper abdomen is within normal limits.

Musculoskeletal: No chest wall abnormality. No acute or significant
osseous findings.
IMPRESSION: Vascular:

1. Fusiform aneurysm of the ascending thoracic aorta measuring up to
42 mm, similar to comparison. Recommend annual imaging followup by
CTA or MRA. This recommendation follows [NO]
ACCF/AHA/AATS/ACR/ASA/SCA/KLPIGBB/KLPIGBB/KLPIGBB/KLPIGBB Guidelines for the
Diagnosis and Management of Patients with Thoracic Aortic Disease.
Circulation. [NO]; 121: E266-e369. Aortic aneurysm NOS ([NO]-[NO])
2. Mild global cardiomegaly.

Non-Vascular:

No acute intrathoracic abnormality.

## 2021-10-16 IMAGING — MR MR CARD MORPHOLOGY WO/W CM
45 of 48 series · 45 of 48 positions shown · IV contrast (Contrast agent)
Comparison: none

CLINICAL DATA: Clinical question of aortic regurgitation
Study assumes HCT of 45 and BSA of 2.29 m2.

EXAM:
CARDIAC MRI
TECHNIQUE: The patient was scanned on a 1.5 Tesla GE magnet. A dedicated
cardiac coil was used. Functional imaging was done using Fiesta
sequences. [DATE], and 4 chamber views were done to assess for RWMA's.
Modified SANTIS rule using a short axis stack was used to
calculate an ejection fraction on a dedicated work station using
Circ[employer] software. The patient received 10 cc of Gadavist. After 10
minutes inversion recovery sequences were used to assess for
infiltration and scar tissue.
CONTRAST:  10 cc  of Gadavist

[Series 4: t2_haste_db_tra_bh · axial · 8.0mm · 1.56mm/px · 1 of 20 slices shown]
[im 1/20]
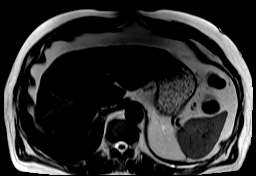

[Series 8: bSSFP · sagittal · 8.0mm · 1.61mm/px · 1 of 25 slices shown (1 of 22)]
[im 1/25]
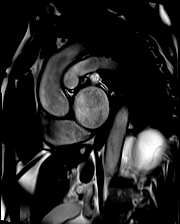

[Series 11: bSSFP · sagittal · 8.0mm · 1.61mm/px · 1 of 25 slices shown (2 of 22)]
[im 1/25]
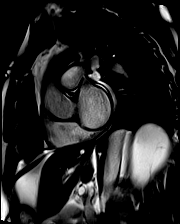

[Series 12: bSSFP · sagittal · 8.0mm · 1.61mm/px · 1 of 25 slices shown (3 of 22)]
[im 1/25]
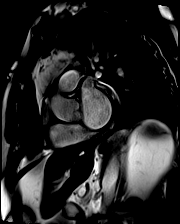

[Series 13: bSSFP · sagittal · 8.0mm · 1.61mm/px · 1 of 25 slices shown (4 of 22)]
[im 1/25]
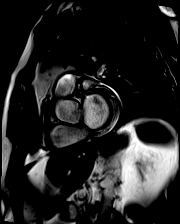

[Series 14: bSSFP · sagittal · 8.0mm · 1.61mm/px · 1 of 25 slices shown (5 of 22)]
[im 1/25]
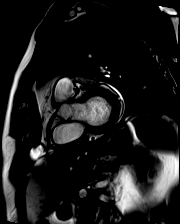

[Series 15: bSSFP · sagittal · 8.0mm · 1.61mm/px · 1 of 25 slices shown (6 of 22)]
[im 1/25]
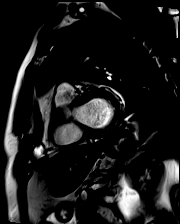

[Series 16: bSSFP · sagittal · 8.0mm · 1.61mm/px · 1 of 25 slices shown (7 of 22)]
[im 1/25]
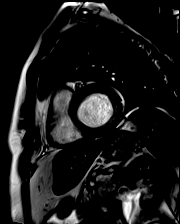

[Series 17: bSSFP · sagittal · 8.0mm · 1.61mm/px · 1 of 25 slices shown (8 of 22)]
[im 1/25]
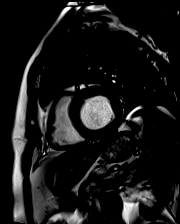

[Series 18: bSSFP · sagittal · 8.0mm · 1.61mm/px · 1 of 25 slices shown (9 of 22)]
[im 1/25]
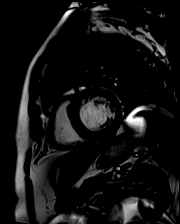

[Series 19: bSSFP · sagittal · 8.0mm · 1.61mm/px · 1 of 25 slices shown (10 of 22)]
[im 1/25]
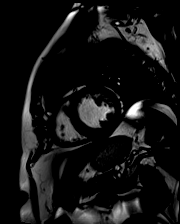

[Series 20: bSSFP · sagittal · 8.0mm · 1.61mm/px · 1 of 25 slices shown (11 of 22)]
[im 1/25]
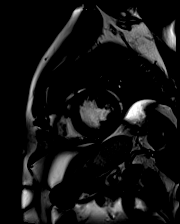

[Series 21: bSSFP · sagittal · 8.0mm · 1.61mm/px · 1 of 25 slices shown (12 of 22)]
[im 1/25]
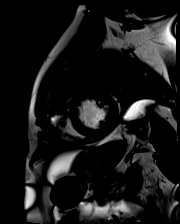

[Series 22: bSSFP · sagittal · 8.0mm · 1.61mm/px · 1 of 25 slices shown (13 of 22)]
[im 1/25]
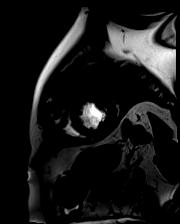

[Series 23: bSSFP · sagittal · 8.0mm · 1.61mm/px · 1 of 25 slices shown (14 of 22)]
[im 1/25]
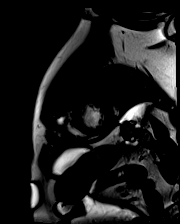

[Series 24: bSSFP · sagittal · 8.0mm · 1.61mm/px · 1 of 25 slices shown (15 of 22)]
[im 1/25]
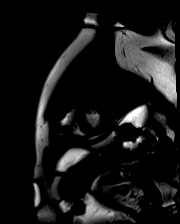

[Series 25: bSSFP · sagittal · 8.0mm · 1.61mm/px · 1 of 25 slices shown (16 of 22)]
[im 1/25]
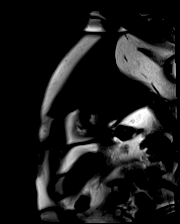

[Series 26: bSSFP · sagittal · 8.0mm · 1.61mm/px · 1 of 25 slices shown (17 of 22)]
[im 1/25]
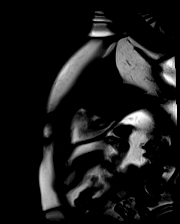

[Series 27: bSSFP · sagittal · 8.0mm · 1.61mm/px · 1 of 25 slices shown (18 of 22)]
[im 1/25]
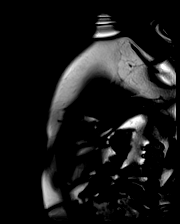

[Series 28: bSSFP · sagittal · 8.0mm · 1.61mm/px · 1 of 25 slices shown (19 of 22)]
[im 1/25]
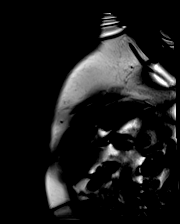

[Series 29: (id)_long_t1 · sagittal · 8.0mm · 1.56mm/px · 1 of 24 slices shown]
[im 1/24]
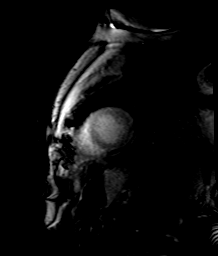

[Series 30: (id)_long_t1_moco · sagittal · 8.0mm · 1.56mm/px · 1 of 24 slices shown]
[im 1/24]
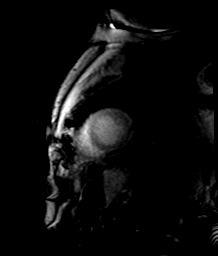

[Series 31: (id)_long_t1_moco_t1 · 1 of 3 slices shown (1 of 2)]
[im 1/3]
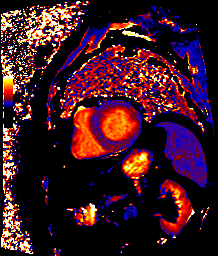

[Series 31: (id)_long_t1_moco_t1 · sagittal · 8.0mm · 1.56mm/px · 1 of 3 slices shown (2 of 2)]
[im 1/3]
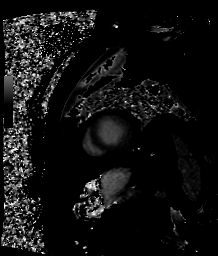

[Series 33: (id)_trufi · sagittal · 8.0mm · 2.08mm/px · 1 of 9 slices shown]
[im 1/9]
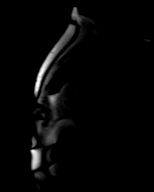

[Series 34: (id)_trufi_moco · sagittal · 8.0mm · 2.08mm/px · 1 of 9 slices shown]
[im 1/9]
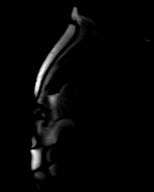

[Series 35: (id)_trufi_moco_t2 · sagittal · 8.0mm · 2.08mm/px · 1 of 3 slices shown]
[im 1/3]
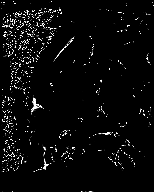

[Series 37: bSSFP · axial · 6.0mm · 1.41mm/px · 1 of 25 slices shown (20 of 22)]
[im 1/25]
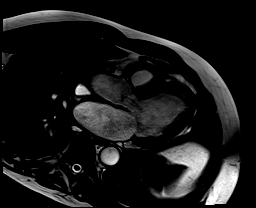

[Series 38: bSSFP · coronal · 6.0mm · 1.48mm/px · 1 of 25 slices shown (21 of 22)]
[im 1/25]
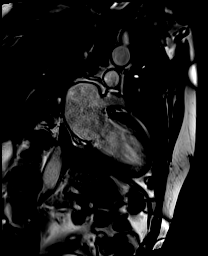

[Series 39: bSSFP · axial · 6.0mm · 1.41mm/px · 1 of 25 slices shown (22 of 22)]
[im 1/25]
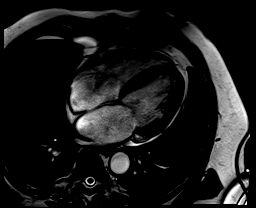

[Series 40: cine_trufi_cs_rt_short axis · sagittal · 8.0mm · 1.73mm/px · 1 of 12 slices shown (1 of 15)]
[im 1/12]
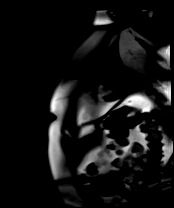

[Series 40: cine_trufi_cs_rt_short axis · sagittal · 8.0mm · 1.73mm/px · 1 of 12 slices shown (2 of 15)]
[im 1/12]
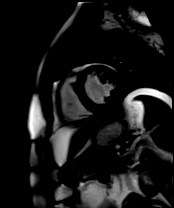

[Series 40: cine_trufi_cs_rt_short axis · sagittal · 8.0mm · 1.73mm/px · 1 of 12 slices shown (3 of 15)]
[im 1/12]
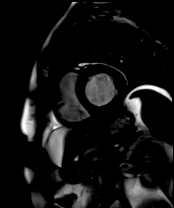

[Series 40: cine_trufi_cs_rt_short axis · sagittal · 8.0mm · 1.73mm/px · 1 of 12 slices shown (4 of 15)]
[im 1/12]
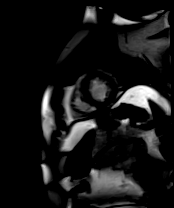

[Series 40: cine_trufi_cs_rt_short axis · sagittal · 8.0mm · 1.73mm/px · 1 of 12 slices shown (5 of 15)]
[im 1/12]
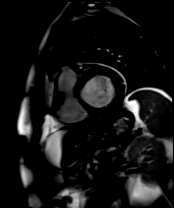

[Series 40: cine_trufi_cs_rt_short axis · sagittal · 8.0mm · 1.73mm/px · 1 of 12 slices shown (6 of 15)]
[im 1/12]
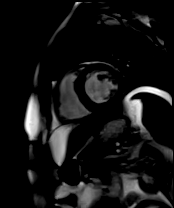

[Series 40: cine_trufi_cs_rt_short axis · sagittal · 8.0mm · 1.73mm/px · 1 of 12 slices shown (7 of 15)]
[im 1/12]
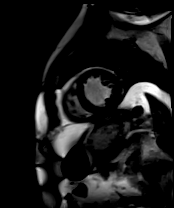

[Series 40: cine_trufi_cs_rt_short axis · sagittal · 8.0mm · 1.73mm/px · 1 of 12 slices shown (8 of 15)]
[im 1/12]
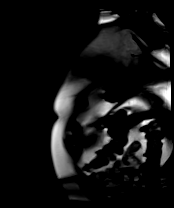

[Series 40: cine_trufi_cs_rt_short axis · sagittal · 8.0mm · 1.73mm/px · 1 of 12 slices shown (9 of 15)]
[im 1/12]
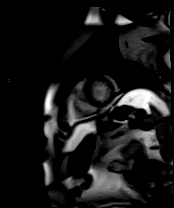

[Series 40: cine_trufi_cs_rt_short axis · sagittal · 8.0mm · 1.73mm/px · 1 of 12 slices shown (10 of 15)]
[im 1/12]
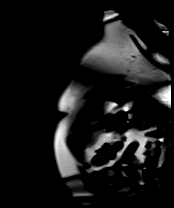

[Series 40: cine_trufi_cs_rt_short axis · sagittal · 8.0mm · 1.73mm/px · 1 of 12 slices shown (11 of 15)]
[im 1/12]
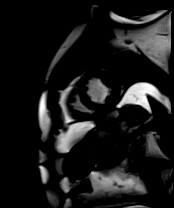

[Series 40: cine_trufi_cs_rt_short axis · sagittal · 8.0mm · 1.73mm/px · 1 of 12 slices shown (12 of 15)]
[im 1/12]
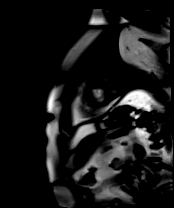

[Series 40: cine_trufi_cs_rt_short axis · sagittal · 8.0mm · 1.73mm/px · 1 of 12 slices shown (13 of 15)]
[im 1/12]
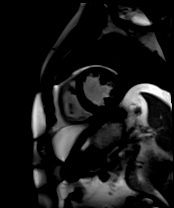

[Series 40: cine_trufi_cs_rt_short axis · sagittal · 8.0mm · 1.73mm/px · 1 of 12 slices shown (14 of 15)]
[im 1/12]
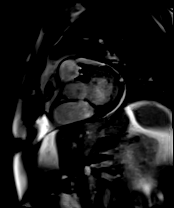

[Series 40: cine_trufi_cs_rt_short axis · sagittal · 8.0mm · 1.73mm/px · 1 of 12 slices shown (15 of 15)]
[im 1/12]
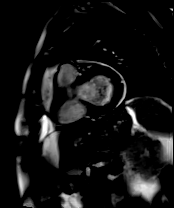

[45 of 48 positions shown; findings below may reference images not displayed]

FINDINGS: 1. Normal left ventricular size, with LVEDD 51 mm, and LVEDVi 91
mL/m2.

Normal left ventricular thickness, with intraventricular septal
thickness of 10 mm, posterior wall thickness of 10 mm, and
myocardial mass index of 76 g/m2.

Moderate left ventricular systolic dysfunction (LVEF = 33%). There
are no regional wall motion abnormalities- global hypokinesis.

Left ventricular parametric mapping notable for normal ECV signal
and T2.

There is late gadolinium enhancement in the left ventricular
myocardium. Posterior RV insertion point LGE, basal mid myocardial
lateral and inferolateral LGE, mid inferior epicardial LGE. 5
Standard deviation assessment used.

2. Normal right ventricular size with RVEDVI 64 mL/m2.

Normal right ventricular thickness.

Moderate right ventricular systolic dysfunction (RVEF =34%). There
are no regional wall motion abnormalities or aneurysms.

3.  Bilateral moderate atrial dilation.

4.  Great vessel evaluation:

Main pulmonary artery- mildly dilated 32 mm

Aortic Root- 38 mm

Sinus of Valsalva- 39 mm at largest (Left to non cusp)

Ascending Aorta 42 mm mildly dilated

Aortic Arch- 35 mm

Descending Aorta- 28 mm

5. Valve assessment:

Aortic Valve: Tri-leaflet Aortic valve. Qualitatively mild
regurgitation.

Pulmonic Valve: Qualitatively no significant regurgitation.

Tricuspid Valve: Qualitatively mild tricuspid regurgitation

Mitral Valve: Regurgitant fraction 26% by LV-RV stroke volume.
Though decreased accuracy in the setting of multiple valve disease,
there is mild to moderate mitral regurgitation.

6.  Normal pericardium.  No pericardial effusion.

7. Grossly, no extracardiac findings. Recommended dedicated study if
concerned for non-cardiac pathology.

8. Breathhold Artifacts noted. This decreased the sensitivity of
quantitative ejection fraction.
IMPRESSION: LV and RV function are moderately reduced.

Mild thoracic aortic aneurysm.

## 2021-10-16 MED ORDER — GADOBUTROL 1 MMOL/ML IV SOLN
10.0000 mL | Freq: Once | INTRAVENOUS | Status: AC | PRN
  Administered 2021-10-16: 10 mL via INTRAVENOUS

## 2021-10-23 DIAGNOSIS — H33321 Round hole, right eye: Secondary | ICD-10-CM | POA: Diagnosis not present

## 2021-10-23 DIAGNOSIS — H43811 Vitreous degeneration, right eye: Secondary | ICD-10-CM | POA: Diagnosis not present

## 2021-10-23 DIAGNOSIS — H43391 Other vitreous opacities, right eye: Secondary | ICD-10-CM | POA: Diagnosis not present

## 2021-10-24 DIAGNOSIS — H31091 Other chorioretinal scars, right eye: Secondary | ICD-10-CM | POA: Diagnosis not present

## 2021-11-12 DIAGNOSIS — I10 Essential (primary) hypertension: Secondary | ICD-10-CM | POA: Diagnosis not present

## 2021-11-12 DIAGNOSIS — Z7901 Long term (current) use of anticoagulants: Secondary | ICD-10-CM | POA: Diagnosis not present

## 2021-11-12 DIAGNOSIS — I4891 Unspecified atrial fibrillation: Secondary | ICD-10-CM | POA: Diagnosis not present

## 2021-11-15 DIAGNOSIS — I10 Essential (primary) hypertension: Secondary | ICD-10-CM | POA: Diagnosis not present

## 2021-11-15 DIAGNOSIS — E785 Hyperlipidemia, unspecified: Secondary | ICD-10-CM | POA: Diagnosis not present

## 2021-11-15 DIAGNOSIS — Z125 Encounter for screening for malignant neoplasm of prostate: Secondary | ICD-10-CM | POA: Diagnosis not present

## 2021-11-15 DIAGNOSIS — R739 Hyperglycemia, unspecified: Secondary | ICD-10-CM | POA: Diagnosis not present

## 2021-11-26 DIAGNOSIS — M791 Myalgia, unspecified site: Secondary | ICD-10-CM | POA: Diagnosis not present

## 2021-11-26 DIAGNOSIS — E785 Hyperlipidemia, unspecified: Secondary | ICD-10-CM | POA: Diagnosis not present

## 2021-11-26 DIAGNOSIS — D6869 Other thrombophilia: Secondary | ICD-10-CM | POA: Diagnosis not present

## 2021-11-26 DIAGNOSIS — D692 Other nonthrombocytopenic purpura: Secondary | ICD-10-CM | POA: Diagnosis not present

## 2021-11-26 DIAGNOSIS — I25118 Atherosclerotic heart disease of native coronary artery with other forms of angina pectoris: Secondary | ICD-10-CM | POA: Diagnosis not present

## 2021-11-26 DIAGNOSIS — I4891 Unspecified atrial fibrillation: Secondary | ICD-10-CM | POA: Diagnosis not present

## 2021-11-26 DIAGNOSIS — I10 Essential (primary) hypertension: Secondary | ICD-10-CM | POA: Diagnosis not present

## 2021-11-26 DIAGNOSIS — Z0000 Encounter for general adult medical examination without abnormal findings: Secondary | ICD-10-CM | POA: Diagnosis not present

## 2021-11-26 DIAGNOSIS — I712 Thoracic aortic aneurysm, without rupture, unspecified: Secondary | ICD-10-CM | POA: Diagnosis not present

## 2021-11-26 DIAGNOSIS — Z1331 Encounter for screening for depression: Secondary | ICD-10-CM | POA: Diagnosis not present

## 2021-11-26 DIAGNOSIS — I5020 Unspecified systolic (congestive) heart failure: Secondary | ICD-10-CM | POA: Diagnosis not present

## 2021-11-26 DIAGNOSIS — F419 Anxiety disorder, unspecified: Secondary | ICD-10-CM | POA: Diagnosis not present

## 2021-11-26 DIAGNOSIS — R911 Solitary pulmonary nodule: Secondary | ICD-10-CM | POA: Diagnosis not present

## 2021-11-26 DIAGNOSIS — Z1339 Encounter for screening examination for other mental health and behavioral disorders: Secondary | ICD-10-CM | POA: Diagnosis not present

## 2021-11-28 DIAGNOSIS — H35371 Puckering of macula, right eye: Secondary | ICD-10-CM | POA: Diagnosis not present

## 2021-12-09 ENCOUNTER — Other Ambulatory Visit: Payer: Medicare Other | Admitting: *Deleted

## 2021-12-09 ENCOUNTER — Other Ambulatory Visit: Payer: Self-pay

## 2021-12-09 DIAGNOSIS — E785 Hyperlipidemia, unspecified: Secondary | ICD-10-CM

## 2021-12-09 LAB — LIPID PANEL
Chol/HDL Ratio: 2.1 ratio (ref 0.0–5.0)
Cholesterol, Total: 113 mg/dL (ref 100–199)
HDL: 55 mg/dL (ref >39)
LDL Chol Calc (NIH): 31 mg/dL (ref 0–99)
Triglycerides: 165 mg/dL — ABNORMAL HIGH (ref 0–149)
VLDL Cholesterol Cal: 27 mg/dL (ref 5–40)

## 2021-12-09 LAB — HEPATIC FUNCTION PANEL
ALT: 24 IU/L (ref 0–44)
AST: 28 IU/L (ref 0–40)
Albumin: 4.6 g/dL (ref 3.7–4.7)
Alkaline Phosphatase: 57 IU/L (ref 44–121)
Bilirubin Total: 0.6 mg/dL (ref 0.0–1.2)
Bilirubin, Direct: 0.23 mg/dL (ref 0.00–0.40)
Total Protein: 7 g/dL (ref 6.0–8.5)

## 2021-12-17 DIAGNOSIS — I4891 Unspecified atrial fibrillation: Secondary | ICD-10-CM | POA: Diagnosis not present

## 2021-12-17 DIAGNOSIS — I10 Essential (primary) hypertension: Secondary | ICD-10-CM | POA: Diagnosis not present

## 2021-12-17 DIAGNOSIS — Z7901 Long term (current) use of anticoagulants: Secondary | ICD-10-CM | POA: Diagnosis not present

## 2021-12-22 ENCOUNTER — Other Ambulatory Visit: Payer: Self-pay | Admitting: Cardiology

## 2022-01-07 DIAGNOSIS — Z1212 Encounter for screening for malignant neoplasm of rectum: Secondary | ICD-10-CM | POA: Diagnosis not present

## 2022-01-07 DIAGNOSIS — L570 Actinic keratosis: Secondary | ICD-10-CM | POA: Diagnosis not present

## 2022-01-07 DIAGNOSIS — L814 Other melanin hyperpigmentation: Secondary | ICD-10-CM | POA: Diagnosis not present

## 2022-01-07 DIAGNOSIS — Z1211 Encounter for screening for malignant neoplasm of colon: Secondary | ICD-10-CM | POA: Diagnosis not present

## 2022-01-07 DIAGNOSIS — L84 Corns and callosities: Secondary | ICD-10-CM | POA: Diagnosis not present

## 2022-01-07 DIAGNOSIS — L821 Other seborrheic keratosis: Secondary | ICD-10-CM | POA: Diagnosis not present

## 2022-01-07 DIAGNOSIS — Z85828 Personal history of other malignant neoplasm of skin: Secondary | ICD-10-CM | POA: Diagnosis not present

## 2022-01-14 DIAGNOSIS — I10 Essential (primary) hypertension: Secondary | ICD-10-CM | POA: Diagnosis not present

## 2022-01-14 DIAGNOSIS — Z7901 Long term (current) use of anticoagulants: Secondary | ICD-10-CM | POA: Diagnosis not present

## 2022-01-14 DIAGNOSIS — I4891 Unspecified atrial fibrillation: Secondary | ICD-10-CM | POA: Diagnosis not present

## 2022-01-28 DIAGNOSIS — I10 Essential (primary) hypertension: Secondary | ICD-10-CM | POA: Diagnosis not present

## 2022-01-28 DIAGNOSIS — Z7901 Long term (current) use of anticoagulants: Secondary | ICD-10-CM | POA: Diagnosis not present

## 2022-01-28 DIAGNOSIS — I4891 Unspecified atrial fibrillation: Secondary | ICD-10-CM | POA: Diagnosis not present

## 2022-01-29 DIAGNOSIS — H52203 Unspecified astigmatism, bilateral: Secondary | ICD-10-CM | POA: Diagnosis not present

## 2022-01-29 DIAGNOSIS — H04123 Dry eye syndrome of bilateral lacrimal glands: Secondary | ICD-10-CM | POA: Diagnosis not present

## 2022-01-29 DIAGNOSIS — H16401 Unspecified corneal neovascularization, right eye: Secondary | ICD-10-CM | POA: Diagnosis not present

## 2022-01-29 DIAGNOSIS — H35372 Puckering of macula, left eye: Secondary | ICD-10-CM | POA: Diagnosis not present

## 2022-02-25 DIAGNOSIS — I4891 Unspecified atrial fibrillation: Secondary | ICD-10-CM | POA: Diagnosis not present

## 2022-02-25 DIAGNOSIS — Z7901 Long term (current) use of anticoagulants: Secondary | ICD-10-CM | POA: Diagnosis not present

## 2022-03-05 ENCOUNTER — Other Ambulatory Visit: Payer: Self-pay | Admitting: Thoracic Surgery (Cardiothoracic Vascular Surgery)

## 2022-03-05 DIAGNOSIS — I7121 Aneurysm of the ascending aorta, without rupture: Secondary | ICD-10-CM

## 2022-03-17 ENCOUNTER — Encounter: Payer: Self-pay | Admitting: Internal Medicine

## 2022-03-22 ENCOUNTER — Other Ambulatory Visit: Payer: Self-pay | Admitting: Cardiology

## 2022-03-24 MED ORDER — LOSARTAN POTASSIUM 100 MG PO TABS: ORAL_TABLET | ORAL | 1 refills | Status: DC

## 2022-03-24 NOTE — Addendum Note (Signed)
Addended by: Carter Kitten D on: 03/24/2022 02:49 PM ? ? Modules accepted: Orders ? ?

## 2022-03-25 DIAGNOSIS — I4891 Unspecified atrial fibrillation: Secondary | ICD-10-CM | POA: Diagnosis not present

## 2022-03-25 DIAGNOSIS — Z7901 Long term (current) use of anticoagulants: Secondary | ICD-10-CM | POA: Diagnosis not present

## 2022-04-01 ENCOUNTER — Ambulatory Visit (INDEPENDENT_AMBULATORY_CARE_PROVIDER_SITE_OTHER): Payer: Medicare Other | Admitting: Physician Assistant

## 2022-04-01 ENCOUNTER — Ambulatory Visit
Admission: RE | Admit: 2022-04-01 | Discharge: 2022-04-01 | Disposition: A | Discharge status: Home / Self Care | Payer: Medicare Other | Source: Ambulatory Visit | Attending: Thoracic Surgery (Cardiothoracic Vascular Surgery) | Admitting: Thoracic Surgery (Cardiothoracic Vascular Surgery)

## 2022-04-01 VITALS — BP 134/87 | HR 84 | Resp 20 | Ht 71.0 in | Wt 241.4 lb

## 2022-04-01 DIAGNOSIS — I7121 Aneurysm of the ascending aorta, without rupture: Secondary | ICD-10-CM

## 2022-04-01 DIAGNOSIS — I700 Atherosclerosis of aorta: Secondary | ICD-10-CM | POA: Diagnosis not present

## 2022-04-01 IMAGING — CT CT CHEST W/O CM
2 of 5 series · 15 of 36 positions shown, 18 images · non-contrast
Comparison: [DATE] MRI, and prior chest CT from [DATE]

CLINICAL DATA: Aortic aneurysm. Prior cardiac catheterization and
stenting.



[Series 4: chest 2.00 br40 s3 · coronal · 0.73mm/px · 3 of 163 slices shown]
[im 33/163  lung]
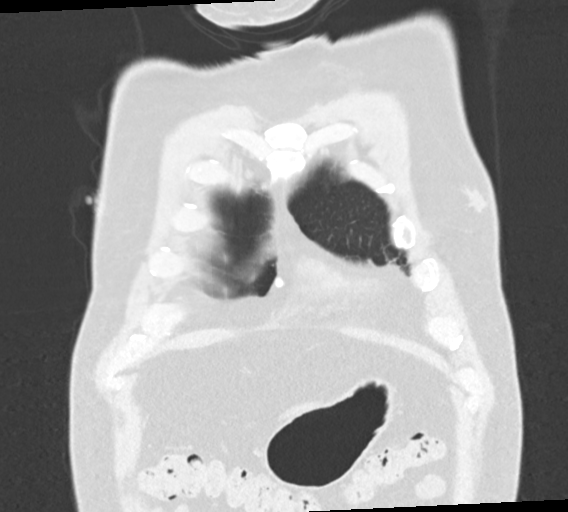
[im 65/163  lung]
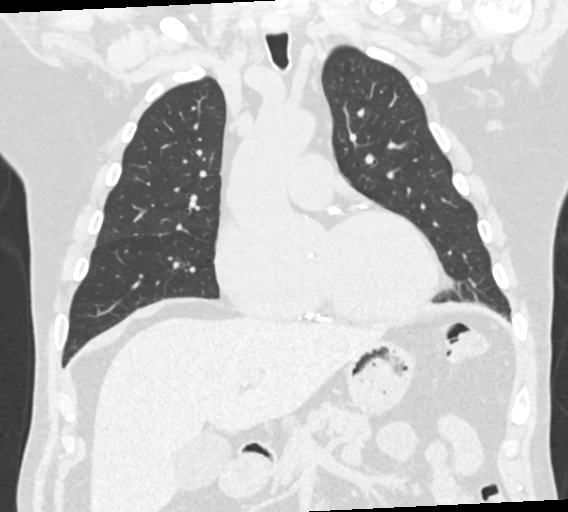
[im 98/163  lung]
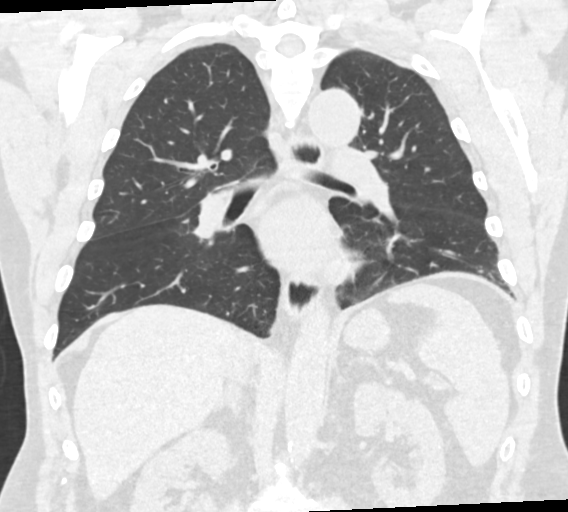

[Series 10: chest 1.00 br40 s3 super d · axial · 0.81mm/px · z∈[+1402,+1725]mm · 12 of 468 slices shown, 15 images]
[im 32/468  mediastinal]
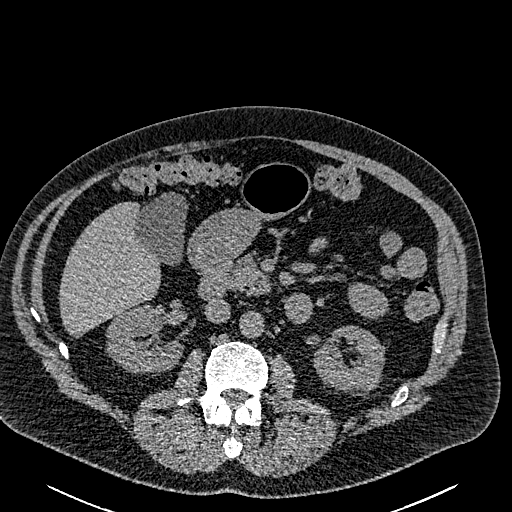
[im 32/468  lung]
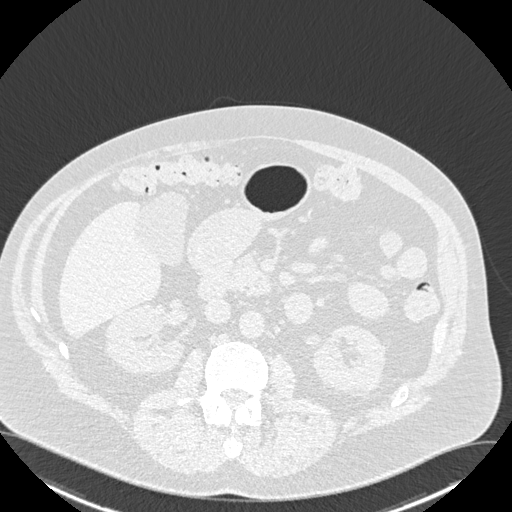
[im 63/468  lung]
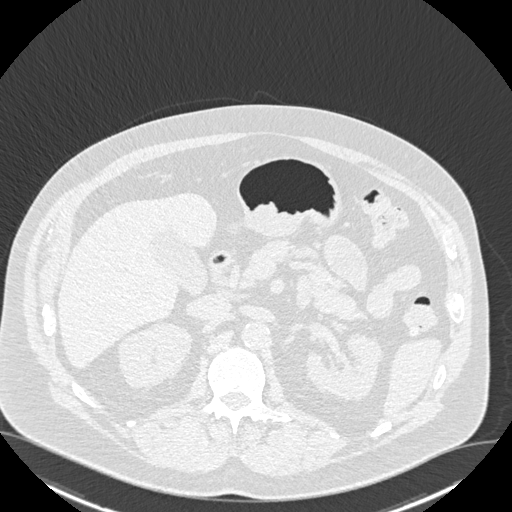
[im 94/468  lung]
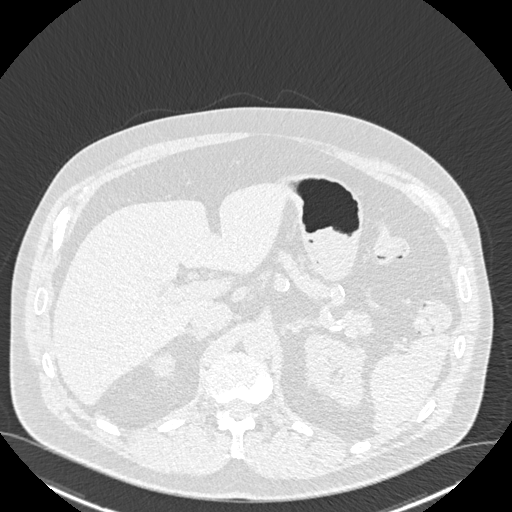
[im 156/468  lung]
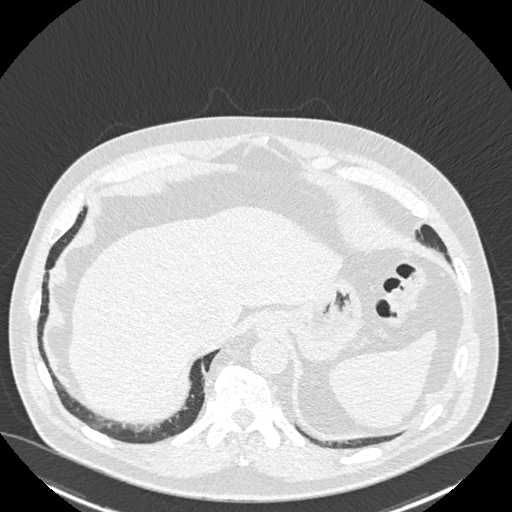
[im 187/468  mediastinal]
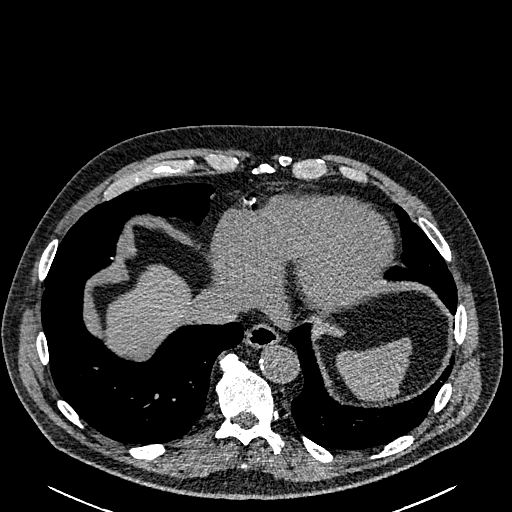
[im 187/468  lung]
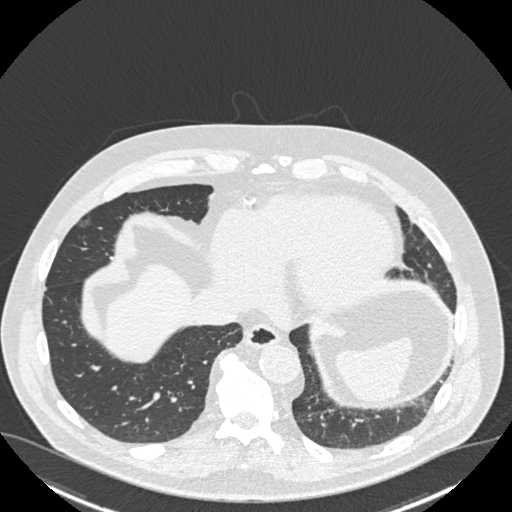
[im 218/468  lung]
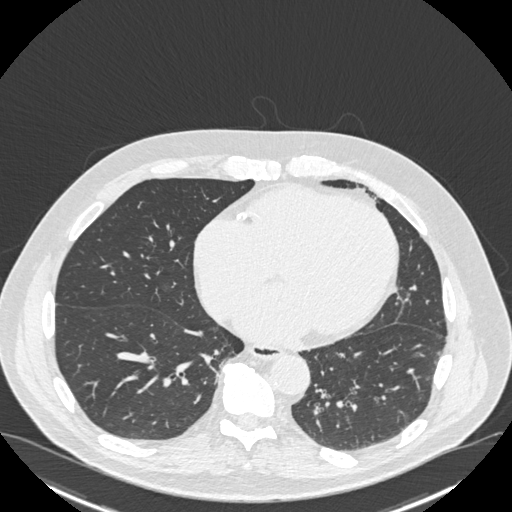
[im 250/468  lung]
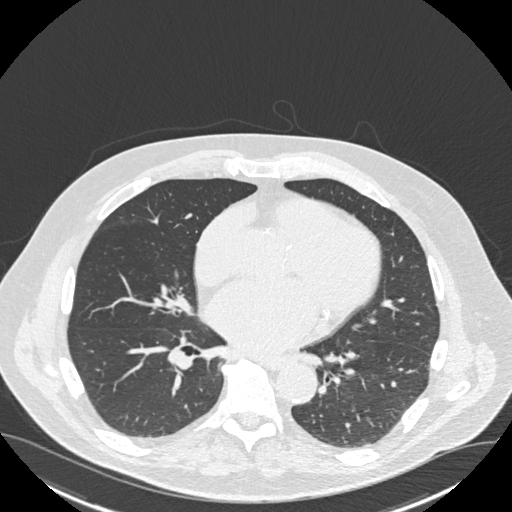
[im 281/468  lung]
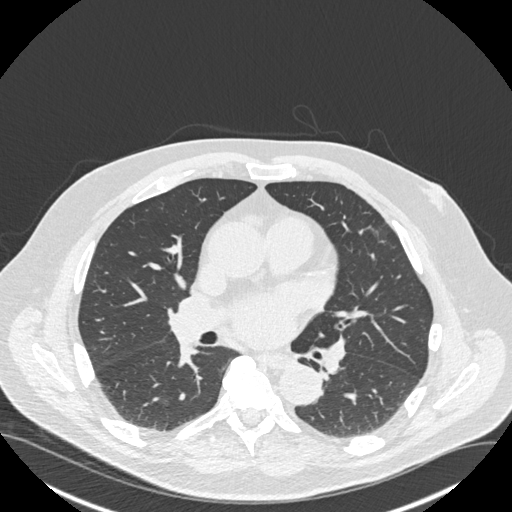
[im 312/468  mediastinal]
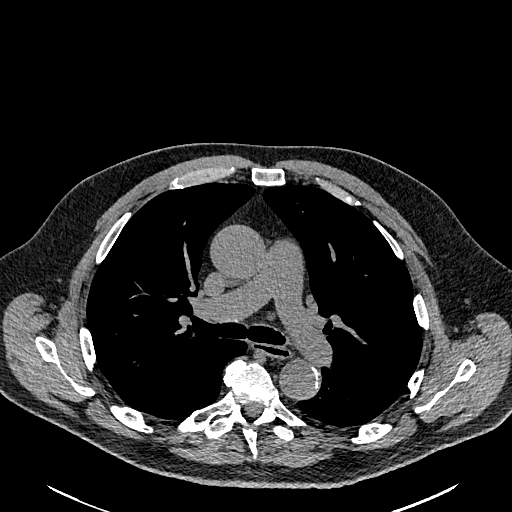
[im 312/468  lung]
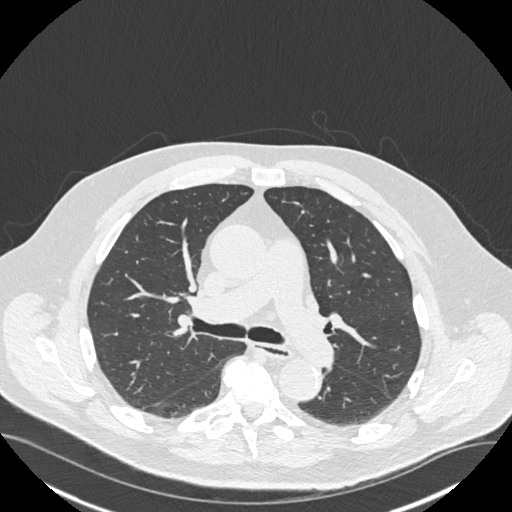
[im 374/468  lung]
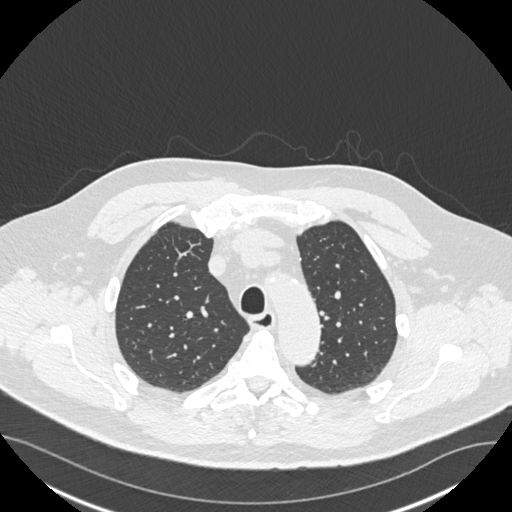
[im 405/468  lung]
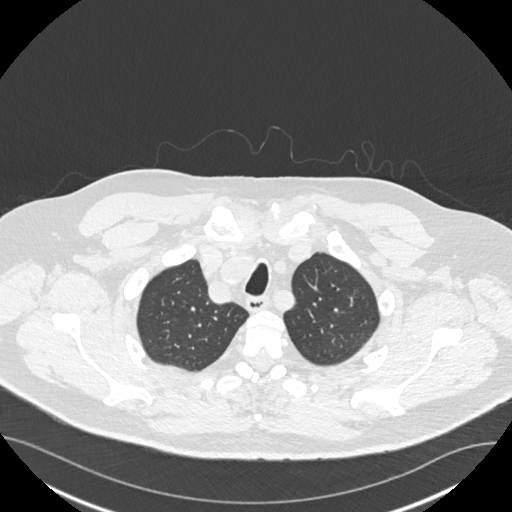
[im 436/468  lung]
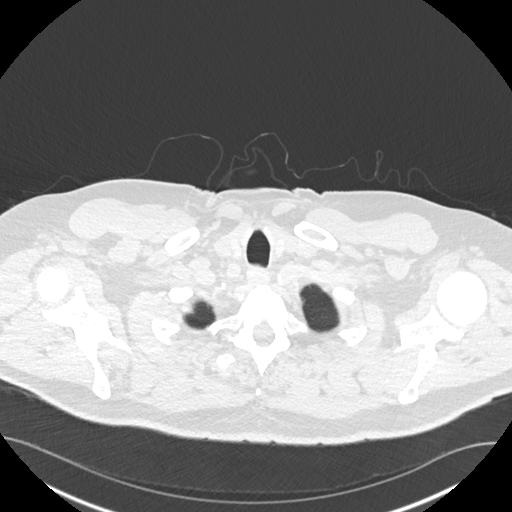

[15 of 36 positions shown; findings below may reference images not displayed]

FINDINGS: Cardiovascular: Coronary, aortic arch, and branch vessel
atherosclerotic vascular disease. Mild cardiomegaly.

Fusiform ascending thoracic aortic aneurysm 4.4 cm in diameter on
image 62 series 2, no change from [DATE]. Mild aortic valve
calcification.

Mediastinum/Nodes: Unremarkable

Lungs/Pleura: No significant findings.

Upper Abdomen: Abdominal aortic atherosclerosis. Common origin of
the celiac trunk and SMA with atherosclerotic calcification
proximally. Descending colon diverticulosis.

Musculoskeletal: Thoracic spondylosis.
IMPRESSION: 1. Stable fusiform ascending thoracic aortic aneurysm, 4.4 cm in
diameter. Recommend annual imaging followup by CTA or MRA. This
recommendation follows [VL]
ACCF/AHA/AATS/ACR/ASA/SCA/JASEN/JASEN/JASEN/JASEN Guidelines for the
Diagnosis and Management of Patients with Thoracic Aortic Disease.
Circulation. [VL]; 121: E266-e369. Aortic aneurysm NOS ([VL]-[VL])
2. Aortic Atherosclerosis ([VL]-[VL]). Coronary and systemic
atherosclerosis.
3. Mild cardiomegaly.
4. Descending colon diverticulosis.

## 2022-04-01 NOTE — Progress Notes (Signed)
[address]       [place] [address]             [phone]      Mr. Butcher is a 71 year old male with a history of coronary disease, status post PTCA with a drug-eluting stent to the LAD and RPDA in August 2016.  Prior to that, he also had PCI in 2010.  He also has history of hypertension, dyslipidemia, paroxysmal atrial fibrillation, and mild to moderate mitral insufficiency.  He has been known to have dilation of the ascending aorta dating back to 2017 when it was measured at 39 mm by CT scan.  Subsequent CT scanning in 2019 showed it to be 42 mm.  Echocardiogram in September of last year measured at 40 mm.  The echo showed the aortic valve to be tricuspid with no stenosis or insufficiency.  He had a noncontrast (has allergy to IV contrast) CT scan of his chest on 09/03/2021 showing progression of the ascending aortic dilation to 45 mm.    We saw him for the first time in January of this year.  He was moderately hypertensive at the time and we recommended continued surveillance and more strict blood pressure control.  He has been started on Repatha since his last visit and has had a very favorable response in his lipid profile.   Mr. Meiner denies having any chest pain. He has never smoked. He continues to exercise and maintain a healthy lifestyle.     Current Outpatient Medications  Medication Sig Dispense Refill   ALPRAZolam (XANAX) 1 MG tablet Take 0.5-1 mg by mouth daily as needed for anxiety.     azithromycin (ZITHROMAX) 250 MG tablet Take 1 tablet (250 mg total) by mouth daily. Take first 2 tablets together, then 1 every day until finished. 6 tablet 0   Evolocumab (REPATHA SURECLICK) 062 MG/ML SOAJ Inject 140 mg into the skin every 14 (fourteen) days. 2 mL 11   Fluorouracil (TOLAK) 4 % CREA Apply 1 application topically at bedtime. 40 g 1   HYDROcodone-acetaminophen (NORCO/VICODIN) 5-325 MG tablet Take 1-2 tablets by mouth every 6 (six) hours as needed for  moderate pain or severe pain. 20 tablet 0   losartan (COZAAR) 100 MG tablet TAKE 1 TABLET(100 MG) BY MOUTH DAILY 90 tablet 1   metoprolol tartrate (LOPRESSOR) 100 MG tablet TAKE 1 TABLET(100 MG) BY MOUTH TWICE DAILY 180 tablet 2   predniSONE (DELTASONE) 20 MG tablet Take 2 tablets (40 mg total) by mouth daily. 10 tablet 0   rosuvastatin (CRESTOR) 10 MG tablet Take 1 tablet (10 mg total) by mouth daily. 90 tablet 3   TURMERIC PO Take 1 tablet by mouth 2 (two) times daily.     warfarin (COUMADIN) 5 MG tablet TAKE AS DIRECTED BY ANTICOAGULATION CLINIC (Patient taking differently: Take 7.5 mg by mouth See admin instructions. 7.'5mg'$  on all days EXCEPT '10mg'$  on Saturday) 150 tablet 1   No current facility-administered medications for this visit.    Physical Exam Vital signs: BP 134/87 Pulse 84 Respirations 20 O2 sat 96% on room air  Neuro: Well-developed 71 year old male with a steady gait.  No acute distress. Heart: Irregular rhythm.  No murmur. Chest: Breath sounds are full, equal, and clear to auscultation. Extremities: All well perfused with no peripheral edema. Neuro: Grossly intact.   Diagnostic Tests: CLINICAL DATA:  Aortic aneurysm. Prior cardiac catheterization and stenting.   EXAM: CT CHEST WITHOUT CONTRAST   TECHNIQUE:  Multidetector CT imaging of the chest was performed following the standard protocol without IV contrast.   RADIATION DOSE REDUCTION: This exam was performed according to the departmental dose-optimization program which includes automated exposure control, adjustment of the mA and/or kV according to patient size and/or use of iterative reconstruction technique.   COMPARISON:  10/16/2021 MRI, and prior chest CT from 09/03/2021   FINDINGS: Cardiovascular: Coronary, aortic arch, and branch vessel atherosclerotic vascular disease. Mild cardiomegaly.   Fusiform ascending thoracic aortic aneurysm 4.4 cm in diameter on image 62 series 2, no change from  09/03/2021. Mild aortic valve calcification.   Mediastinum/Nodes: Unremarkable   Lungs/Pleura: No significant findings.   Upper Abdomen: Abdominal aortic atherosclerosis. Common origin of the celiac trunk and SMA with atherosclerotic calcification proximally. Descending colon diverticulosis.   Musculoskeletal: Thoracic spondylosis.   IMPRESSION: 1. Stable fusiform ascending thoracic aortic aneurysm, 4.4 cm in diameter. Recommend annual imaging followup by CTA or MRA. This recommendation follows 2010 ACCF/AHA/AATS/ACR/ASA/SCA/SCAI/SIR/STS/SVM Guidelines for the Diagnosis and Management of Patients with Thoracic Aortic Disease. Circulation. 2010; 121: [id number]. Aortic aneurysm NOS (ICD10-I71.9) 2. Aortic Atherosclerosis (ICD10-I70.0). Coronary and systemic atherosclerosis. 3. Mild cardiomegaly. 4. Descending colon diverticulosis.     Electronically Signed   By: Van Clines M.D.   On: 04/01/2022 13:21  Impression / Plan: Stable asymptomatic ascending aortic aneurysm in a 71 year old male. We reviewed the importance of blood pressure management, avoidance of strenuous activities, and avoidance of the fluoroquinolone class of antibiotics.   Plan for repeat noncontrast CTA chest in 6 months.   Antony Odea, PA-C [facility] [phone]

## 2022-04-01 NOTE — Progress Notes (Deleted)
      [address]       [place] [address]             [phone]       

## 2022-04-01 NOTE — Patient Instructions (Signed)
Continue careful management and monitoring of your blood pressure.  No change in medications from CT surgery standpoint  Follow-up in 6 months with repeat noncontrast CTA chest

## 2022-04-22 DIAGNOSIS — Z7901 Long term (current) use of anticoagulants: Secondary | ICD-10-CM | POA: Diagnosis not present

## 2022-04-22 DIAGNOSIS — I4891 Unspecified atrial fibrillation: Secondary | ICD-10-CM | POA: Diagnosis not present

## 2022-04-29 DIAGNOSIS — R1013 Epigastric pain: Secondary | ICD-10-CM | POA: Diagnosis not present

## 2022-04-29 DIAGNOSIS — M545 Low back pain, unspecified: Secondary | ICD-10-CM | POA: Diagnosis not present

## 2022-04-29 DIAGNOSIS — Z7901 Long term (current) use of anticoagulants: Secondary | ICD-10-CM | POA: Diagnosis not present

## 2022-04-29 DIAGNOSIS — I4891 Unspecified atrial fibrillation: Secondary | ICD-10-CM | POA: Diagnosis not present

## 2022-05-19 DIAGNOSIS — H35031 Hypertensive retinopathy, right eye: Secondary | ICD-10-CM | POA: Diagnosis not present

## 2022-05-19 DIAGNOSIS — H59813 Chorioretinal scars after surgery for detachment, bilateral: Secondary | ICD-10-CM | POA: Diagnosis not present

## 2022-05-19 DIAGNOSIS — H35371 Puckering of macula, right eye: Secondary | ICD-10-CM | POA: Diagnosis not present

## 2022-05-20 DIAGNOSIS — Z7901 Long term (current) use of anticoagulants: Secondary | ICD-10-CM | POA: Diagnosis not present

## 2022-05-20 DIAGNOSIS — M545 Low back pain, unspecified: Secondary | ICD-10-CM | POA: Diagnosis not present

## 2022-05-20 DIAGNOSIS — I4891 Unspecified atrial fibrillation: Secondary | ICD-10-CM | POA: Diagnosis not present

## 2022-05-29 DIAGNOSIS — I4891 Unspecified atrial fibrillation: Secondary | ICD-10-CM | POA: Diagnosis not present

## 2022-05-29 DIAGNOSIS — Z7901 Long term (current) use of anticoagulants: Secondary | ICD-10-CM | POA: Diagnosis not present

## 2022-05-29 DIAGNOSIS — M545 Low back pain, unspecified: Secondary | ICD-10-CM | POA: Diagnosis not present

## 2022-06-02 DIAGNOSIS — M545 Low back pain, unspecified: Secondary | ICD-10-CM | POA: Diagnosis not present

## 2022-06-05 ENCOUNTER — Other Ambulatory Visit: Payer: Self-pay | Admitting: Registered Nurse

## 2022-06-05 DIAGNOSIS — M545 Low back pain, unspecified: Secondary | ICD-10-CM

## 2022-06-11 DIAGNOSIS — M545 Low back pain, unspecified: Secondary | ICD-10-CM | POA: Diagnosis not present

## 2022-06-15 ENCOUNTER — Ambulatory Visit
Admission: RE | Admit: 2022-06-15 | Discharge: 2022-06-15 | Disposition: A | Discharge status: Home / Self Care | Payer: Medicare Other | Source: Ambulatory Visit | Attending: Registered Nurse | Admitting: Registered Nurse

## 2022-06-15 DIAGNOSIS — M545 Low back pain, unspecified: Secondary | ICD-10-CM | POA: Diagnosis not present

## 2022-06-15 DIAGNOSIS — M4316 Spondylolisthesis, lumbar region: Secondary | ICD-10-CM | POA: Diagnosis not present

## 2022-06-15 DIAGNOSIS — M48061 Spinal stenosis, lumbar region without neurogenic claudication: Secondary | ICD-10-CM | POA: Diagnosis not present

## 2022-06-19 DIAGNOSIS — Z7901 Long term (current) use of anticoagulants: Secondary | ICD-10-CM | POA: Diagnosis not present

## 2022-06-19 DIAGNOSIS — I4891 Unspecified atrial fibrillation: Secondary | ICD-10-CM | POA: Diagnosis not present

## 2022-06-19 DIAGNOSIS — M545 Low back pain, unspecified: Secondary | ICD-10-CM | POA: Diagnosis not present

## 2022-06-20 ENCOUNTER — Other Ambulatory Visit: Payer: Self-pay | Admitting: Cardiology

## 2022-06-24 ENCOUNTER — Ambulatory Visit: Payer: Medicare Other | Admitting: Dermatology

## 2022-06-26 DIAGNOSIS — I4891 Unspecified atrial fibrillation: Secondary | ICD-10-CM | POA: Diagnosis not present

## 2022-06-26 DIAGNOSIS — M545 Low back pain, unspecified: Secondary | ICD-10-CM | POA: Diagnosis not present

## 2022-06-26 DIAGNOSIS — Z7901 Long term (current) use of anticoagulants: Secondary | ICD-10-CM | POA: Diagnosis not present

## 2022-07-11 ENCOUNTER — Ambulatory Visit (HOSPITAL_COMMUNITY): Payer: Medicare Other | Attending: Cardiology

## 2022-07-11 DIAGNOSIS — I719 Aortic aneurysm of unspecified site, without rupture: Secondary | ICD-10-CM

## 2022-07-11 DIAGNOSIS — I340 Nonrheumatic mitral (valve) insufficiency: Secondary | ICD-10-CM | POA: Diagnosis not present

## 2022-07-11 LAB — ECHOCARDIOGRAM COMPLETE
Area-P 1/2: 3.06 cm2
P 1/2 time: 881 msec
S' Lateral: 3.5 cm

## 2022-07-15 ENCOUNTER — Encounter: Payer: Self-pay | Admitting: Cardiology

## 2022-07-15 DIAGNOSIS — I351 Nonrheumatic aortic (valve) insufficiency: Secondary | ICD-10-CM | POA: Insufficient documentation

## 2022-07-24 DIAGNOSIS — Z7901 Long term (current) use of anticoagulants: Secondary | ICD-10-CM | POA: Diagnosis not present

## 2022-07-24 DIAGNOSIS — I4891 Unspecified atrial fibrillation: Secondary | ICD-10-CM | POA: Diagnosis not present

## 2022-08-08 ENCOUNTER — Other Ambulatory Visit: Payer: Self-pay | Admitting: Cardiology

## 2022-08-08 DIAGNOSIS — E785 Hyperlipidemia, unspecified: Secondary | ICD-10-CM

## 2022-08-19 ENCOUNTER — Other Ambulatory Visit: Payer: Self-pay | Admitting: Thoracic Surgery (Cardiothoracic Vascular Surgery)

## 2022-08-19 DIAGNOSIS — I712 Thoracic aortic aneurysm, without rupture, unspecified: Secondary | ICD-10-CM

## 2022-08-19 DIAGNOSIS — I77810 Thoracic aortic ectasia: Secondary | ICD-10-CM

## 2022-08-21 DIAGNOSIS — I4891 Unspecified atrial fibrillation: Secondary | ICD-10-CM | POA: Diagnosis not present

## 2022-08-21 DIAGNOSIS — M5136 Other intervertebral disc degeneration, lumbar region: Secondary | ICD-10-CM | POA: Diagnosis not present

## 2022-08-21 DIAGNOSIS — M545 Low back pain, unspecified: Secondary | ICD-10-CM | POA: Diagnosis not present

## 2022-08-21 DIAGNOSIS — Z7901 Long term (current) use of anticoagulants: Secondary | ICD-10-CM | POA: Diagnosis not present

## 2022-09-16 DIAGNOSIS — M5136 Other intervertebral disc degeneration, lumbar region: Secondary | ICD-10-CM | POA: Diagnosis not present

## 2022-09-16 DIAGNOSIS — Z7901 Long term (current) use of anticoagulants: Secondary | ICD-10-CM | POA: Diagnosis not present

## 2022-09-16 DIAGNOSIS — I4891 Unspecified atrial fibrillation: Secondary | ICD-10-CM | POA: Diagnosis not present

## 2022-09-18 ENCOUNTER — Other Ambulatory Visit: Payer: Self-pay | Admitting: Cardiology

## 2022-09-19 ENCOUNTER — Ambulatory Visit: Payer: Medicare Other | Attending: Cardiology | Admitting: Cardiology

## 2022-09-19 ENCOUNTER — Encounter: Payer: Self-pay | Admitting: Cardiology

## 2022-09-19 ENCOUNTER — Other Ambulatory Visit: Payer: Self-pay | Admitting: Cardiology

## 2022-09-19 VITALS — BP 128/82 | HR 75 | Ht 71.0 in | Wt 239.0 lb

## 2022-09-19 DIAGNOSIS — G4733 Obstructive sleep apnea (adult) (pediatric): Secondary | ICD-10-CM | POA: Insufficient documentation

## 2022-09-19 DIAGNOSIS — I4821 Permanent atrial fibrillation: Secondary | ICD-10-CM | POA: Diagnosis not present

## 2022-09-19 DIAGNOSIS — I2510 Atherosclerotic heart disease of native coronary artery without angina pectoris: Secondary | ICD-10-CM | POA: Diagnosis not present

## 2022-09-19 DIAGNOSIS — I77810 Thoracic aortic ectasia: Secondary | ICD-10-CM | POA: Diagnosis not present

## 2022-09-19 DIAGNOSIS — E7800 Pure hypercholesterolemia, unspecified: Secondary | ICD-10-CM | POA: Diagnosis not present

## 2022-09-19 DIAGNOSIS — I10 Essential (primary) hypertension: Secondary | ICD-10-CM | POA: Diagnosis not present

## 2022-09-19 LAB — COMPREHENSIVE METABOLIC PANEL
ALT: 26 IU/L (ref 0–44)
AST: 26 IU/L (ref 0–40)
Albumin/Globulin Ratio: 2.1 (ref 1.2–2.2)
Albumin: 4.6 g/dL (ref 3.8–4.8)
Alkaline Phosphatase: 42 IU/L — ABNORMAL LOW (ref 44–121)
BUN/Creatinine Ratio: 25 — ABNORMAL HIGH (ref 10–24)
BUN: 26 mg/dL (ref 8–27)
Bilirubin Total: 0.7 mg/dL (ref 0.0–1.2)
CO2: 23 mmol/L (ref 20–29)
Calcium: 9.6 mg/dL (ref 8.6–10.2)
Chloride: 102 mmol/L (ref 96–106)
Creatinine, Ser: 1.06 mg/dL (ref 0.76–1.27)
Globulin, Total: 2.2 g/dL (ref 1.5–4.5)
Glucose: 97 mg/dL (ref 70–99)
Potassium: 4.7 mmol/L (ref 3.5–5.2)
Sodium: 140 mmol/L (ref 134–144)
Total Protein: 6.8 g/dL (ref 6.0–8.5)
eGFR: 75 mL/min/{1.73_m2} (ref >59)

## 2022-09-19 LAB — LIPID PANEL
Chol/HDL Ratio: 3.3 ratio (ref 0.0–5.0)
Cholesterol, Total: 145 mg/dL (ref 100–199)
HDL: 44 mg/dL (ref >39)
LDL Chol Calc (NIH): 60 mg/dL (ref 0–99)
Triglycerides: 261 mg/dL — ABNORMAL HIGH (ref 0–149)
VLDL Cholesterol Cal: 41 mg/dL — ABNORMAL HIGH (ref 5–40)

## 2022-09-19 NOTE — Addendum Note (Signed)
Addended by: Bernestine Amass on: 09/19/2022 09:32 AM   Modules accepted: Orders

## 2022-09-19 NOTE — Patient Instructions (Signed)
Medication Instructions:  Your physician recommends that you continue on your current medications as directed. Please refer to the Current Medication list given to you today.  *If you need a refill on your cardiac medications before your next appointment, please call your pharmacy*   Lab Work: TODAY: FLP and CMET If you have labs (blood work) drawn today and your tests are completely normal, you will receive your results only by: [facility] (if you have MyChart) OR A paper copy in the mail If you have any lab test that is abnormal or we need to change your treatment, we will call you to review the results.   Follow-Up: At [facility], you and your health needs are our priority.  As part of our continuing mission to provide you with exceptional heart care, we have created designated Provider Care Teams.  These Care Teams include your primary Cardiologist (physician) and Advanced Practice Providers (APPs -  Physician Assistants and Nurse Practitioners) who all work together to provide you with the care you need, when you need it.   Your next appointment:   1 year(s)  The format for your next appointment:   In Person  Provider:   Fransico Him, MD     Important Information About Sugar

## 2022-09-19 NOTE — Progress Notes (Addendum)
Cardiology Office Note    Date:  09/19/2022   ID:  Troy [name], DOB 1951-09-18, MRN [id number]  PCP:  Velna Hatchet, MD  Cardiologist:  Fransico Him, MD   Chief Complaint  Patient presents with   Coronary Artery Disease   Hypertension   Hyperlipidemia   Atrial Fibrillation   Sleep Apnea     History of Present Illness:  Troy [name] is a 71 y.o. male with a history of ASCAD with DES mLAD & RPDA w/ VF arrest 06/2015, PCI in [place] 2010, HTN, HLD, MR, PAF (not anticoagulated in the past), pulm nodule stable 07/2016.  He had recurrent afib and was loaded with Amio and underwent TEE and cardioverted to NSR but then went back into afib 02/13/2017.  His amio was stopped and had repeat DCCV 02/20/2017.  He was seen by Dr. Curt Bears with EP and stated that he did not wish to have any further DCCV if he went back into afib and would prefer ablation.  He also has OSA and is doing well with his CPAP device. He was admitted in January 2018 with CP and nuclear stress test showed apical ischemia.  He underwent cath showing patent stents with 80% diag and 60% mid RCA.  2D echo showed normal LVF with EF 55-60% with inferior HK and ascending aorta mildly dilated at 4.1cm.  He has been followed by Dr. Curt Bears and now has longstanding persistent atrial fibrillation on chronic anticoagulation with coumadin for a CHADS2VASC score of 3.    He is here today for followup and is doing well.  He denies any chest pain or pressure, SOB, DOE, PND, orthopnea, LE edema, palpitations or syncope.  Occasionally he will have a dizzy spell if he sits up too fast.He is compliant with his meds and is tolerating meds with no SE.    He is doing well with his PAP device and thinks that he has gotten used to it.  He tolerates the mask and feels the pressure is adequate.  Since going on PAP he feels rested in the am and has no significant daytime sleepiness.  He denies any significant mouth or nasal dryness or nasal congestion.  He  does not think that he snores.    Past Medical History:  Diagnosis Date   Aortic regurgitation    Mild by echo 07/2022   Arthritis    "knees" (01/02/2017)   Ascending aorta dilatation (HCC)    59m by CT 2017 and 435mby CT 2019, 4321my echo 07/2020, 80m57m echo 2022, 41 mm by echo 2023   CAD (coronary artery disease)    a. s/[place] Unstable angina/LHC 8/8/7/6/8115plicated by V fib arrest after contrast injection. Cath 06/19/2015 s/p DES to mid LAD and RPDA. c. 11/2016: chest pain/ abnormal nuclear stress test prompting cath 11/13/16 showing 80% ostial diag, 60% mid RCA, no acute lesions, medical therapy recommended.   Dyslipidemia    Essential hypertension    Lung nodule < 6cm on CT 06/16/2015   Right lung   Mitral regurgitation    mild to moderate by echo 07/2020   Mitral regurgitation    Mild by echo 07/2022   OSA on CPAP    Persistent atrial fibrillation (HCC)[place] a. 06/2015 noted to be in new a-fib when arrived with unstable angina. Converted to NSR after being shocked in the cath lab for vfib;  b. 06/2015 Eliquis initiated as PAF noted on event monitor.  c. Recurrent atrial fib?flutter 10/2016, issues with recurrent AF requiring multiple DCCVs in 02/2017. Failed sotalol, not candidate for Tikosyn due to cost/QTC, Multaq not felt likely strong enough.   Pulmonary nodule 07/2015   a. 1.5 x 1.2 cm smoothly marginated nodule in the central aspect of the right lower lobe with recommendation correlation with nonemergent PET-CT to exclude a neoplasm , stable by CT 07/2016   Severe left ventricular hypertrophy    Ventricular fibrillation ([employer])    a. occured during cath 06/18/2015.    Past Surgical History:  Procedure Laterality Date   CARDIAC CATHETERIZATION N/A 06/18/2015   Procedure: Left Heart Cath and Coronary Angiography;  Surgeon: Burnell Blanks, MD;  Location: [facility];  Service: Cardiovascular;  Laterality: N/A;   CARDIAC CATHETERIZATION N/A 06/19/2015    Procedure: Coronary Stent Intervention;  Surgeon: Peter M Martinique, MD;  Location: [facility];  Service: Cardiovascular;  Laterality: N/A;   CARDIAC CATHETERIZATION N/A 11/13/2016   Procedure: Left Heart Cath and Coronary Angiography;  Surgeon: Lorretta Harp, MD;  Location: [facility];  Service: Cardiovascular;  Laterality: N/A;   CARDIOVERSION N/A 11/17/2016   Procedure: CARDIOVERSION;  Surgeon: Larey Dresser, MD;  Location: [facility];  Service: Cardiovascular;  Laterality: N/A;   CARDIOVERSION N/A 01/03/2017   Procedure: CARDIOVERSION;  Surgeon: Lelon Perla, MD;  Location: [facility];  Service: Cardiovascular;  Laterality: N/A;   CARDIOVERSION N/A 02/05/2017   Procedure: Cardioversion;  Surgeon: Evans Lance, MD;  Location: [facility];  Service: Cardiovascular;  Laterality: N/A;   CARDIOVERSION N/A 02/20/2017   Procedure: Cardioversion;  Surgeon: Evans Lance, MD;  Location: [facility];  Service: Cardiovascular;  Laterality: N/A;   CARTILAGE SURGERY Bilateral    "thumbs"   CATARACT EXTRACTION W/ INTRAOCULAR LENS  IMPLANT, BILATERAL Bilateral    CORONARY ANGIOPLASTY WITH STENT PLACEMENT  ~ 2007   JOINT REPLACEMENT     KNEE ARTHROSCOPY Right    RETINAL DETACHMENT SURGERY Bilateral    "laser thing"   TEE WITHOUT CARDIOVERSION N/A 11/17/2016   Procedure: TRANSESOPHAGEAL ECHOCARDIOGRAM (TEE);  Surgeon: Larey Dresser, MD;  Location: [facility];  Service: Cardiovascular;  Laterality: N/A;   TOTAL KNEE ARTHROPLASTY Left 2000s   TOTAL KNEE ARTHROPLASTY Right 02/08/2018   Procedure: RIGHT TOTAL KNEE ARTHROPLASTY;  Surgeon: Gaynelle Arabian, MD;  Location: [facility];  Service: Orthopedics;  Laterality: Right;  with block    Current Medications: Current Meds  Medication Sig   ALPRAZolam (XANAX) 1 MG tablet Take 0.5-1 mg by mouth daily as needed for anxiety.   losartan (COZAAR) 100 MG tablet TAKE 1 TABLET(100 MG) BY MOUTH DAILY   metoprolol tartrate (LOPRESSOR)  100 MG tablet TAKE 1 TABLET(100 MG) BY MOUTH TWICE DAILY   REPATHA SURECLICK 621 MG/ML SOAJ ADMINISTER 1 ML UNDER THE SKIN EVERY 14 DAYS   TURMERIC PO Take 1 tablet by mouth 2 (two) times daily.   warfarin (COUMADIN) 5 MG tablet TAKE AS DIRECTED BY ANTICOAGULATION CLINIC    Allergies:   Gluten meal, Iodinated contrast media, Metrizamide, Tizanidine, Whey, Morphine and related, Rosuvastatin, Apple juice, Tizanidine hcl, and Wheat bran   Social History   Socioeconomic History   Marital status: Divorced    Spouse name: Not on file   Number of children: Not on file   Years of education: Not on file   Highest education level: Not on file  Occupational History   Not on file  Tobacco Use   Smoking status: Never   Smokeless tobacco: Never   Tobacco comments:    Second-hand exposure through parents  Vaping Use   Vaping Use: Never used  Substance and Sexual Activity   Alcohol use: Yes    Alcohol/week: 2.0 standard drinks of alcohol    Types: 2 Glasses of wine per week   Drug use: No   Sexual activity: Yes  Other Topics Concern   Not on file  Social History Narrative   Originally he is from [place], [place]. He moved to [facility] in 2013. He has a dog & cat at home. No bird or mold exposure. Has a hot tub but hasn't used it in 4 months. 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    Social Determinants of Health   Financial Resource Strain: Not on file  Food Insecurity: Not on file  Transportation Needs: Not on file  Physical Activity: Not on file  Stress: Not on file  Social Connections: Not on file     Family History:  The patient's family history includes Diabetes in his sister; Drug abuse in his father; Emphysema in his mother; Heart attack in his father, maternal grandfather, and paternal grandfather; Lung cancer in his mother; Stroke in his mother.   ROS:   Please see the history of present illness.    ROS All other systems reviewed and are negative.      No data to display              PHYSICAL EXAM:   VS:  BP 128/82   Pulse 75   Ht '5\' 11"'$  (1.803 m)   Wt 239 lb (108.4 kg)   SpO2 98%   BMI 33.33 kg/m    GEN: Well nourished, well developed in no acute distress HEENT: Normal NECK: No JVD; No carotid bruits LYMPHATICS: No lymphadenopathy CARDIAC: Irregularly irregular, no murmurs, rubs, gallops RESPIRATORY:  Clear to auscultation without rales, wheezing or rhonchi  ABDOMEN: Soft, non-tender, non-distended MUSCULOSKELETAL:  No edema; No deformity  SKIN: Warm and dry NEUROLOGIC:  Alert and oriented x 3 PSYCHIATRIC:  Normal affect  Wt Readings from Last 3 Encounters:  09/19/22 239 lb (108.4 kg)  04/01/22 241 lb 6.4 oz (109.5 kg)  09/17/21 231 lb 6.4 oz (105 kg)      Studies/Labs Reviewed:   EKG:  EKG is ordered today and demonstrates atrial fibrillation with CVR and inferior infarct with no ST changes  Recent Labs: 12/09/2021: ALT 24   Lipid Panel    Component Value Date/Time   CHOL 113 12/09/2021 0813   TRIG 165 (H) 12/09/2021 0813   HDL 55 12/09/2021 0813   CHOLHDL 2.1 12/09/2021 0813   CHOLHDL 2.5 08/15/2015 0912   VLDL 15 08/15/2015 0912   LDLCALC 31 12/09/2021 0813    Additional studies/ records that were reviewed today include:  Cath report    ASSESSMENT:    1. Coronary artery disease involving native coronary artery of native heart without angina pectoris   2. Permanent atrial fibrillation ([place])   3. Essential (primary) hypertension   4. Pure hypercholesterolemia   5. OSA (obstructive sleep apnea)   6. Ascending aorta dilatation (HCC)      PLAN:  In order of problems listed above:  1.  ASCAD  - s/p DES mLAD & RPDA w/ VF arrest 06/2015, PCI in [place] 2010 and recent cath 11/2016 with patent grafts and 80% D1 and 60% RCA.   -He denies any anginal symptoms -Continue prescription drug  management with Lopressor 100 mg twice daily with as needed refills -He is statin intolerant -He is not on ASA due to warfarin. -most recent echo  07/2022 unchanged with EF 50-55% with inferior HK unchanged from prior Echo  2.  Permenant atrial fibrillation  -Heart rate is at the controlled on exam today in A-fib -He denies any bleeding problems on warfarin -Continue prescription drug with Lopressor 100 mg twice daily and Coumadin followed by Coumadin clinic -I will check a CBC and cmet today  3.  HTN  -BP is adequately controlled on exam today -Continue with drug management losartan 100 mg daily Lopressor 100 mg twice daily with as needed refills  4.  Dyslipidemia  -with LDL goal < 70.  -He is statin intolerant  -Check FLP and ALT -Continue prescription drug management with Repatha and Crestor 10 mg daily with.  Refills   5. OSA - The patient is tolerating PAP therapy well without any problems. The PAP download performed by his DME was personally reviewed and interpreted by me today and showed an AHI of 1.7 /hr on 10 cm H2O with 93 % compliance in using more than 4 hours nightly.  The patient has been using and benefiting from PAP use and will continue to benefit from therapy.   6.  Dilated ascending aorta -4.1cm on echo 9/23 -repeat echo in 1 year    Medication Adjustments/Labs and Tests Ordered: Current medicines are reviewed at length with the patient today.  Concerns regarding medicines are outlined above.  Medication changes, Labs and Tests ordered today are listed in the Patient Instructions below.  There are no Patient Instructions on file for this visit.   Signed, Fransico Him, MD  09/19/2022 9:30 AM    [facility], [place], [place]  [address] Phone: [phone]; Fax: [phone]

## 2022-09-23 ENCOUNTER — Telehealth: Payer: Self-pay

## 2022-09-23 DIAGNOSIS — E7800 Pure hypercholesterolemia, unspecified: Secondary | ICD-10-CM

## 2022-09-23 NOTE — Telephone Encounter (Signed)
Left message for patient to call back  

## 2022-09-23 NOTE — Telephone Encounter (Signed)
Called patient who states that he is unable to talk right now and requests a call back later.

## 2022-09-23 NOTE — Telephone Encounter (Signed)
-----   Message from Sueanne Margarita, MD sent at 09/23/2022  1:22 PM EST ----- Please find out if patient has missed any Repatha doses ----- Message ----- From: Rollen Sox, [facility] Sent: 09/23/2022   1:08 PM EST To: Sueanne Margarita, MD; Bernestine Amass, RN  Has patient missed Repatha doses?  LDL increased from 31 to 60   ----- Message ----- From: Bernestine Amass, RN Sent: 09/23/2022   8:11 AM EST To: [facility] Pharmd  Dr. Radford Pax would like recommendations from PharmD

## 2022-09-24 NOTE — Telephone Encounter (Signed)
Spoke with the patient who states that he has not missed any doses of Repatha however he was not fasting prior to lab work, he had a protein drink prior.

## 2022-09-24 NOTE — Telephone Encounter (Signed)
Repeat labs have been scheduled.

## 2022-09-24 NOTE — Telephone Encounter (Signed)
Pt states he is returning a call. His phone was acting up and could not answer

## 2022-09-25 ENCOUNTER — Ambulatory Visit: Payer: Medicare Other | Attending: Cardiology

## 2022-09-25 DIAGNOSIS — E7800 Pure hypercholesterolemia, unspecified: Secondary | ICD-10-CM | POA: Diagnosis not present

## 2022-09-25 LAB — LIPID PANEL
Chol/HDL Ratio: 3.7 ratio (ref 0.0–5.0)
Cholesterol, Total: 149 mg/dL (ref 100–199)
HDL: 40 mg/dL (ref >39)
LDL Chol Calc (NIH): 63 mg/dL (ref 0–99)
Triglycerides: 288 mg/dL — ABNORMAL HIGH (ref 0–149)
VLDL Cholesterol Cal: 46 mg/dL — ABNORMAL HIGH (ref 5–40)

## 2022-09-25 NOTE — Progress Notes (Signed)
[address]       [place] [address]             [phone]   PCP is Velna Hatchet, MD Referring Provider is Velna Hatchet, MD  Chief Complaint:   HPI: This is a 71 year old male with a past medical history of hypertension,dyslipidemia, CAD, mild AI and mild MR, OSA (on CPAP), persistent atrial fibrillation (on chronic anticoagulation) , RLL pulmonary nodule, and ascending thoracic aortic aneurysm who presents today further surveillance of ATAA. He has been known to have dilation of the ascending aorta dating back to 2017. It was measured at 39 mm by CT scan.  He was last seen by [facility] on 04/01/2022 and the ATAA at that time was a stable fusiform ascending thoracic aortic aneurysm, 4.4 cm In diameter. He denies chest pain, pressure, shortness of breath or LE edema. Of note, he was seen by Dr. Radford Pax on 09/19/2022. Echo done in September was stable (LVEF 50-55%, mild AI and MR, inferior hypokinesis, and the aortic root and ascending aorta are structurally normal, with no evidence of dilitation. ). He denies chest pain, pressure, shortness of breath.   Past Medical History:  Diagnosis Date   Aortic regurgitation    Mild by echo 07/2022   Arthritis    "knees" (01/02/2017)   Ascending aorta dilatation (HCC)    64m by CT 2017 and 466mby CT 2019, 4320my echo 07/2020, 59m47m echo 2022, 41 mm by echo 2023   CAD (coronary artery disease)    a. s/p PCI in 2010 in [place] Unstable angina/LHC 8/8/1/4/4818plicated by V fib arrest after contrast injection. Cath 06/19/2015 s/p DES to mid LAD and RPDA. c. 11/2016: chest pain/ abnormal nuclear stress test prompting cath 11/13/16 showing 80% ostial diag, 60% mid RCA, no acute lesions, medical therapy recommended.   Dyslipidemia    Essential hypertension    Lung nodule < 6cm on CT 06/16/2015   Right lung   Mitral regurgitation    mild to moderate by echo 07/2020   Mitral regurgitation    Mild by echo 07/2022   OSA on  CPAP    Persistent atrial fibrillation (HCC)[place] a. 06/2015 noted to be in new a-fib when arrived with unstable angina. Converted to NSR after being shocked in the cath lab for vfib;  b. 06/2015 Eliquis initiated as PAF noted on event monitor. c. Recurrent atrial fib?flutter 10/2016, issues with recurrent AF requiring multiple DCCVs in 02/2017. Failed sotalol, not candidate for Tikosyn due to cost/QTC, Multaq not felt likely strong enough.   Pulmonary nodule 07/2015   a. 1.5 x 1.2 cm smoothly marginated nodule in the central aspect of the right lower lobe with recommendation correlation with nonemergent PET-CT to exclude a neoplasm , stable by CT 07/2016   Severe left ventricular hypertrophy    Ventricular fibrillation (HCC)[place] a. occured during cath 06/18/2015.    Past Surgical History:  Procedure Laterality Date   CARDIAC CATHETERIZATION N/A 06/18/2015   Procedure: Left Heart Cath and Coronary Angiography;  Surgeon: ChriBurnell Blanks;  [facility];  Service: Cardiovascular;  Laterality: N/A;   CARDIAC CATHETERIZATION N/A 06/19/2015   Procedure: Coronary Stent Intervention;  Surgeon: Peter M JordMartinique;  [facility];  Service: Cardiovascular;  Laterality: N/A;   CARDIAC CATHETERIZATION N/A 11/13/2016   Procedure: Left Heart Cath and Coronary Angiography;  Surgeon: JonaPearletha Forge  Gwenlyn Found, MD;  Location: [facility];  Service: Cardiovascular;  Laterality: N/A;   CARDIOVERSION N/A 11/17/2016   Procedure: CARDIOVERSION;  Surgeon: Larey Dresser, MD;  Location: [place];  Service: Cardiovascular;  Laterality: N/A;   CARDIOVERSION N/A 01/03/2017   Procedure: CARDIOVERSION;  Surgeon: Lelon Perla, MD;  Location: [place];  Service: Cardiovascular;  Laterality: N/A;   CARDIOVERSION N/A 02/05/2017   Procedure: Cardioversion;  Surgeon: Evans Lance, MD;  Location: [place] [facility];  Service: Cardiovascular;  Laterality: N/A;   CARDIOVERSION N/A 02/20/2017    Procedure: Cardioversion;  Surgeon: Evans Lance, MD;  Location: [place] [facility];  Service: Cardiovascular;  Laterality: N/A;   CARTILAGE SURGERY Bilateral    "thumbs"   CATARACT EXTRACTION W/ INTRAOCULAR LENS  IMPLANT, BILATERAL Bilateral    CORONARY ANGIOPLASTY WITH STENT PLACEMENT  ~ 2007   JOINT REPLACEMENT     KNEE ARTHROSCOPY Right    RETINAL DETACHMENT SURGERY Bilateral    "laser thing"   TEE WITHOUT CARDIOVERSION N/A 11/17/2016   Procedure: TRANSESOPHAGEAL ECHOCARDIOGRAM (TEE);  Surgeon: Larey Dresser, MD;  Location: [facility];  Service: Cardiovascular;  Laterality: N/A;   TOTAL KNEE ARTHROPLASTY Left 2000s   TOTAL KNEE ARTHROPLASTY Right 02/08/2018   Procedure: RIGHT TOTAL KNEE ARTHROPLASTY;  Surgeon: Gaynelle Arabian, MD;  [facility];  Service: Orthopedics;  Laterality: Right;  with block    Family History  Problem Relation Age of Onset   Stroke Mother    Emphysema Mother    Lung cancer Mother    Heart attack Father    Drug abuse Father    Heart attack Maternal Grandfather    Heart attack Paternal Grandfather    Diabetes Sister     Social History Social History   Tobacco Use   Smoking status: Never   Smokeless tobacco: Never   Tobacco comments:    Second-hand exposure through parents  Vaping Use   Vaping Use: Never used  Substance Use Topics   Alcohol use: Yes    Alcohol/week: 2.0 standard drinks of alcohol    Types: 2 Glasses of wine per week   Drug use: No    Current Outpatient Medications  Medication Sig Dispense Refill   ALPRAZolam (XANAX) 1 MG tablet Take 0.5-1 mg by mouth daily as needed for anxiety.     losartan (COZAAR) 100 MG tablet TAKE 1 TABLET(100 MG) BY MOUTH DAILY 90 tablet 3   metoprolol tartrate (LOPRESSOR) 100 MG tablet TAKE 1 TABLET(100 MG) BY MOUTH TWICE DAILY 180 tablet 3   REPATHA SURECLICK 240 MG/ML SOAJ ADMINISTER 1 ML UNDER THE SKIN EVERY 14 DAYS 2 mL 11         TURMERIC PO Take 1 tablet by mouth 2 (two) times daily.      warfarin (COUMADIN) 5 MG tablet    Spironolactone TAKE AS DIRECTED BY ANTICOAGULATION CLINIC 25 mg daily  150 tablet 1   Allergies  Allergen Reactions   Gluten Meal Other (See Comments)    REACTION: Celiac disease (severe stomach pain) REACTION: Celiac disease (severe stomach pain)    Iodinated Contrast Media Other (See Comments) and Anaphylaxis    V-Fib arrest during cardiac cath suspected d/t contrast dye in 2016 Cardiac arrest Cardiac arrest V-Fib arrest during cardiac cath suspected d/t contrast dye in 2016 Cardiac arrest   Metrizamide Other (See Comments)    V-Fib arrest during cardiac cath suspected d/t contrast dye in 2016   Tizanidine Anxiety  Depression   [place] Other (See Comments)    REACTION: Celiac disease (severe stomach pain)   Morphine And Related Other (See Comments)    REACTION: "REALLY BAD STOMACH PAIN"   Rosuvastatin     Myalgias on '40mg'$  daily   Apple Juice Diarrhea   Tizanidine Hcl Anxiety    Depression   Wheat Bran Other (See Comments)    Review of Systems:  Chest Pain [ N ] Resting SOB Aqua.Slicker ] Exertional SOB [ N ]  Pedal Edema [ N ] Syncope [ N ] Presyncope [  N]  General Review of Systems: [Y] = yes [ N]=no  Consitutional:   nausea Aqua.Slicker ];  fever Aqua.Slicker ];  Eye : blurred vision Aqua.Slicker ];  Resp: cough Aqua.Slicker ];  hemoptysis[N ];  GI: vomiting[ N]; melena[ N]; hematochezia [N];  OY:DXAJOINOM[V ]; Heme/Lymph: anemia[ N];  Neuro: TIA[ N];stroke[N ];  seizures[N ];  Endocrine: diabetes[N ];   Vital Signs:  Vitals:   10/07/22 1346  BP: (!) 142/86  Pulse: 64  Resp: 20  SpO2: 95%     Physical Exam: CV-IRRR, IRRR, no murmur Neck-No carotid bruit Pulmonary-Clear to auscultation bilaterally Abdomen-Soft, non tender, bowel sounds Extremities-No LE edema. Varicosities back of left leg Neurologic-Grossly intact without focal deficit  Diagnostic Tests:  Narrative & Impression  CLINICAL DATA:  Follow up thoracic aortic aneurysm. History of atrial  fibrillation and pulmonary nodule. No history of malignancy.   EXAM: CT CHEST WITHOUT CONTRAST   TECHNIQUE: Multidetector CT imaging of the chest was performed following the standard protocol without IV contrast.   RADIATION DOSE REDUCTION: This exam was performed according to the departmental dose-optimization program which includes automated exposure control, adjustment of the mA and/or kV according to patient size and/or use of iterative reconstruction technique.   COMPARISON:  Chest CT 04/01/2022 and 09/03/2021.   FINDINGS: Cardiovascular: Again demonstrated is extensive coronary artery atherosclerosis with lesser involvement of the aorta and great vessels. There is stable mild fusiform dilatation of the ascending aorta which has a maximal diameter of 4.3 cm. No displaced intimal calcifications. There are calcifications of the aortic valve. The heart size is normal. There is no pericardial effusion.   Mediastinum/Nodes: There are no enlarged mediastinal, hilar or axillary lymph nodes.Small mediastinal lymph nodes are unchanged. The thyroid gland, trachea and esophagus demonstrate no significant findings.   Lungs/Pleura: No pleural effusion or pneumothorax. Mild subsegmental atelectasis at both lung bases. No confluent airspace opacity or suspicious pulmonary nodule.   Upper abdomen: The visualized upper abdomen appears stable, without significant findings.   Musculoskeletal/Chest wall: There is no chest wall mass or suspicious osseous finding. Mild thoracic spondylosis. 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 Mild bilateral gynecomastia.   IMPRESSION: 1. Stable mild fusiform dilatation of the ascending aorta, maximal diameter 4.3 cm. Consider continued annual imaging follow-up by CTA or MRA. 2. No acute chest findings. 3. No suspicious pulmonary nodules. Mild subsegmental atelectasis at both lung bases. 4. Coronary and aortic Atherosclerosis  (ICD10-I70.0).     Electronically Signed   By: Richardean Sale M.D.   On: 09/30/2022 12:17    Impression and Plan: We discussed the findings of the CT of the chest without contrast;stable mild fusiform dilatation of the ascending aorta 4.3 cm. We reviewed the risk modifications (included in patient instructions of AVS) in detail. He states he will likely need to stop Repath a soon as he is unable to pay for it. He will discuss his options with  cardiology. He will return with CT of the chest in one year for further surveillance.   Nani Skillern, PA-C [facility] [phone]

## 2022-09-30 ENCOUNTER — Ambulatory Visit
Admission: RE | Admit: 2022-09-30 | Discharge: 2022-09-30 | Disposition: A | Discharge status: Home / Self Care | Payer: Medicare Other | Source: Ambulatory Visit | Attending: Thoracic Surgery (Cardiothoracic Vascular Surgery) | Admitting: Thoracic Surgery (Cardiothoracic Vascular Surgery)

## 2022-09-30 DIAGNOSIS — J9811 Atelectasis: Secondary | ICD-10-CM | POA: Diagnosis not present

## 2022-09-30 DIAGNOSIS — I712 Thoracic aortic aneurysm, without rupture, unspecified: Secondary | ICD-10-CM | POA: Diagnosis not present

## 2022-09-30 DIAGNOSIS — I700 Atherosclerosis of aorta: Secondary | ICD-10-CM | POA: Diagnosis not present

## 2022-09-30 DIAGNOSIS — I77810 Thoracic aortic ectasia: Secondary | ICD-10-CM

## 2022-10-07 ENCOUNTER — Ambulatory Visit (INDEPENDENT_AMBULATORY_CARE_PROVIDER_SITE_OTHER): Payer: Medicare Other | Admitting: Physician Assistant

## 2022-10-07 ENCOUNTER — Encounter: Payer: Self-pay | Admitting: Physician Assistant

## 2022-10-07 VITALS — BP 142/86 | HR 64 | Resp 20 | Ht 71.0 in | Wt 244.0 lb

## 2022-10-07 DIAGNOSIS — I2510 Atherosclerotic heart disease of native coronary artery without angina pectoris: Secondary | ICD-10-CM

## 2022-10-07 DIAGNOSIS — I77810 Thoracic aortic ectasia: Secondary | ICD-10-CM

## 2022-10-07 NOTE — Patient Instructions (Addendum)
Risk Modification in those with ascending thoracic aortic aneurysm:  Continue good control of blood pressure (prefer SBP 130/80 or less)-continue Metoprolol, Spironolactone, and Losartan  2. Avoid fluoroquinolone antibiotics (I.e Ciprofloxacin, Avelox, Levofloxacin, Ofloxacin)  3.  Use of statin (to decrease cardiovascular risk)-on Repatha  4.  Exercise and activity limitations is individualized, but in general, contact sports are to be  avoided and one should avoid heavy lifting (defined as half of ideal body weight) and exercises involving sustained Valsalva maneuver.  5. Counseling for those suspected of having genetically mediated disease. First-degree relatives of those with TAA disease should be screened as well as those who have a connective tissue disease (I.e with Marfan syndrome, Ehlers-Danlos syndrome,  and Loeys-Dietz syndrome) or a  bicuspid aortic valve,have an increased risk for  complications related to TAA  6. He has no tobacco abuse

## 2022-10-16 DIAGNOSIS — M5126 Other intervertebral disc displacement, lumbar region: Secondary | ICD-10-CM | POA: Diagnosis not present

## 2022-10-16 DIAGNOSIS — Z6835 Body mass index (BMI) 35.0-35.9, adult: Secondary | ICD-10-CM | POA: Diagnosis not present

## 2022-10-22 DIAGNOSIS — I4891 Unspecified atrial fibrillation: Secondary | ICD-10-CM | POA: Diagnosis not present

## 2022-10-22 DIAGNOSIS — Z7901 Long term (current) use of anticoagulants: Secondary | ICD-10-CM | POA: Diagnosis not present

## 2022-10-22 DIAGNOSIS — M5136 Other intervertebral disc degeneration, lumbar region: Secondary | ICD-10-CM | POA: Diagnosis not present

## 2022-11-13 ENCOUNTER — Encounter: Payer: Self-pay | Admitting: Physical Therapy

## 2022-11-13 ENCOUNTER — Other Ambulatory Visit: Payer: Self-pay

## 2022-11-13 ENCOUNTER — Ambulatory Visit: Payer: Medicare Other | Attending: Internal Medicine | Admitting: Physical Therapy

## 2022-11-13 DIAGNOSIS — M6281 Muscle weakness (generalized): Secondary | ICD-10-CM | POA: Diagnosis not present

## 2022-11-13 DIAGNOSIS — M5459 Other low back pain: Secondary | ICD-10-CM | POA: Diagnosis not present

## 2022-11-13 NOTE — Therapy (Signed)
OUTPATIENT PHYSICAL THERAPY THORACOLUMBAR EVALUATION  Patient Name: Maxamillion Banas MRN: [id number] DOB:January 23, 1951, 72 y.o., male Today's Date: 11/13/2022   PT End of Session - 11/13/22 1006     Visit Number 1    Number of Visits --   1-2x/week   Date for PT Re-Evaluation 01/08/23    Authorization Type MCR - FOTO    Progress Note Due on Visit 10    PT Start Time 0915    PT Stop Time 0956    PT Time Calculation (min) 41 min             Past Medical History:  Diagnosis Date   Aortic regurgitation    Mild by echo 07/2022   Arthritis    "knees" (01/02/2017)   Ascending aorta dilatation (HCC)    58m by CT 2017 and 426mby CT 2019, 4368my echo 07/2020, 64m14m echo 2022, 41 mm by echo 2023   CAD (coronary artery disease)    a. [place] Unstable angina/LHC 8/8/6/8/3419plicated by V fib arrest after contrast injection. Cath 06/19/2015 s/p DES to mid LAD and RPDA. c. 11/2016: chest pain/ abnormal nuclear stress test prompting cath 11/13/16 showing 80% ostial diag, 60% mid RCA, no acute lesions, medical therapy recommended.   Dyslipidemia    Essential hypertension    Lung nodule < 6cm on CT 06/16/2015   Right lung   Mitral regurgitation    mild to moderate by echo 07/2020   Mitral regurgitation    Mild by echo 07/2022   OSA on CPAP    Persistent atrial fibrillation (HCC)[place] a. 06/2015 noted to be in new a-fib when arrived with unstable angina. Converted to NSR after being shocked in the cath lab for vfib;  b. 06/2015 Eliquis initiated as PAF noted on event monitor. c. Recurrent atrial fib?flutter 10/2016, issues with recurrent AF requiring multiple DCCVs in 02/2017. Failed sotalol, not candidate for Tikosyn due to cost/QTC, Multaq not felt likely strong enough.   Pulmonary nodule 07/2015   a. 1.5 x 1.2 cm smoothly marginated nodule in the central aspect of the right lower lobe with recommendation correlation with nonemergent PET-CT to exclude a neoplasm , stable by CT  07/2016   Severe left ventricular hypertrophy    Ventricular fibrillation (HCC)[place] a. occured during cath 06/18/2015.   Past Surgical History:  Procedure Laterality Date   CARDIAC CATHETERIZATION N/A 06/18/2015   Procedure: Left Heart Cath and Coronary Angiography;  Surgeon: ChriBurnell Blanks;  [facility];  Service: Cardiovascular;  Laterality: N/A;   CARDIAC CATHETERIZATION N/A 06/19/2015   Procedure: Coronary Stent Intervention;  Surgeon: Peter M JordMartinique;  [facility];  Service: Cardiovascular;  Laterality: N/A;   CARDIAC CATHETERIZATION N/A 11/13/2016   Procedure: Left Heart Cath and Coronary Angiography;  Surgeon: JonaLorretta Harp;  [facility];  Service: Cardiovascular;  Laterality: N/A;   CARDIOVERSION N/A 11/17/2016   Procedure: CARDIOVERSION;  Surgeon: DaltLarey Dresser;  [facility];  Laterality: N/A;   CARDIOVERSION N/A 01/03/2017   Procedure: CARDIOVERSION;  Surgeon: BriaLelon Perla;  [facility];  Laterality: N/A;   CARDIOVERSION N/A 02/05/2017   Procedure: Cardioversion;  Surgeon: GregEvans Lance;  [facility];  Service: Cardiovascular;  Laterality: N/A;   CARDIOVERSION N/A 02/20/2017   Procedure: Cardioversion;  Surgeon:  Evans Lance, MD;  Location: [facility];  Service: Cardiovascular;  Laterality: N/A;   CARTILAGE SURGERY Bilateral    "thumbs"   CATARACT EXTRACTION W/ INTRAOCULAR LENS  IMPLANT, BILATERAL Bilateral    CORONARY ANGIOPLASTY WITH STENT PLACEMENT  ~ 2007   JOINT REPLACEMENT     KNEE ARTHROSCOPY Right    RETINAL DETACHMENT SURGERY Bilateral    "laser thing"   TEE WITHOUT CARDIOVERSION N/A 11/17/2016   Procedure: TRANSESOPHAGEAL ECHOCARDIOGRAM (TEE);  Surgeon: Larey Dresser, MD;  Location: [facility];  Service: Cardiovascular;  Laterality: N/A;   TOTAL KNEE ARTHROPLASTY Left 2000s   TOTAL KNEE  ARTHROPLASTY Right 02/08/2018   Procedure: RIGHT TOTAL KNEE ARTHROPLASTY;  Surgeon: Gaynelle Arabian, MD;  [facility]: [facility];  Service: Orthopedics;  Laterality: Right;  with block   Patient Active Problem List   Diagnosis Date Noted   Aortic regurgitation 07/15/2022   Acute cystitis 11/17/2020   AKI (acute kidney injury) ([place]) 11/16/2020   Thrombocytopenia ([place]) 11/16/2020   Sepsis ([place]) 11/15/2020   Ascending aorta dilatation (HCC)    OA (osteoarthritis) of knee 02/08/2018   Environmental and seasonal allergies 07/06/2015   Pulmonary nodule 06/27/2015   Mitral regurgitation    Ventricular fibrillation ([place])    Hyperlipidemia 06/18/2015   CAD (coronary artery disease), native coronary artery 06/17/2015   Persistent atrial fibrillation with RVR ([place]), successful TEE DCCV 11/17/16 06/16/2015   Essential (primary) hypertension 04/11/2015   Abnormal presence of protein in urine 04/11/2015    PCP: Velna Hatchet, MD  REFERRING PROVIDER: Kary Kos, MD  THERAPY DIAG:  Other low back pain - Plan: PT plan of care cert/re-cert  Muscle weakness - Plan: PT plan of care cert/re-cert  REFERRING DIAG: Other intervertebral disc displacement, lumbar region [M51.26]   Rationale for Evaluation and Treatment:  Rehabilitation  SUBJECTIVE:  PERTINENT PAST HISTORY:  Persistent afib      PRECAUTIONS: Chronic L2 pars defects with grade 1 anterolisthesis  WEIGHT BEARING RESTRICTIONS No  FALLS:  Has patient fallen in last 6 months? No, Number of falls: 0  MOI/History of condition:  Onset date: acute on chronic (45 years),   SUBJECTIVE STATEMENT  Zaydan Papesh is a 72 y.o. male who presents to clinic with chief complaint of low back pain which is chronic with acute flare after twisting for ~30 min to 60 min which flared his back.  He reports back flares roughly every 10 years.  He has pain in both LE R>L.  He denies n/t, bb changes, weakness.  His pain is worse in the L LE.  Pain:  Are you  having pain? No Pain location: low back, L LE (mainly lateral lower leg) NPRS scale:  6/10 to 10/10 Aggravating factors: standing and walking 15 min Relieving factors: sitting or lying down Pain description: intermittent, constant, burning, and aching Stage: Chronic Stability: staying the same 24 hour pattern: activity related   Occupation: NA  Assistive Device: NA  Hand Dominance: NA  Patient Goals/Specific Activities: reduce pain, pickle ball, walking    OBJECTIVE:   DIAGNOSTIC FINDINGS:   MRI:  IMPRESSION: 1. L4-5 disc extrusion with left lateral recess and left neural foraminal stenosis. 2. Chronic L2 pars defects with grade 1 anterolisthesis, severe disc degeneration, and mild-to-moderate neural foraminal stenosis.   SENSATION:  Light touch: Appears intact  MUSCLE LENGTH: Loss of lumbar lordosis Hamstrings: Right significant restriction; Left significant restriction ASLR: Right ASLR = PSLR; Left ASLR = PSLR Thomas test: Right subtle restriction; Left  subtle restriction Ely's test: Right significant restriction; Left significant restriction   LUMBAR AROM  AROM AROM  11/13/2022  Flexion Fingertips to knees (limited by ~50%), w/ concordant pain  Extension limited by 50%, w/ concordant pain  Right lateral flexion limited by 50%, w/ concordant pain  Left lateral flexion limited by 50%, w/ concordant pain  Right rotation limited by 50%  Left rotation limited by 50%    (Blank rows = not tested)    LUMBAR SPECIAL TESTS:  Straight leg raise: L (-), R (-) Slump: L (equivocal), R (equivocal)  LE MMT:  MMT Right 11/13/2022 Left 11/13/2022  Hip flexion (L2, L3) 4 4  Knee extension (L3) 4 4  Knee flexion 4 4  Hip abduction 4+ 4+  Hip extension    Hip external rotation    Hip internal rotation    Hip adduction    Ankle dorsiflexion (L4)    Ankle plantarflexion (S1)    Ankle inversion    Ankle eversion    Great Toe ext (L5)    Grossly     (Blank rows =  not tested, score listed is out of 5 possible points.  N = WNL, D = diminished, C = clear for gross weakness with myotome testing, * = concordant pain with testing)   PALPATION:   TTP lower lumbar spine.  PA lower lumbar spine - concordant pain  PATIENT SURVEYS:  FOTO 51 -> 60   TODAY'S TREATMENT  Creating, reviewing, and completing below HEP  PATIENT EDUCATION:  POC, diagnosis, prognosis, HEP, and outcome measures.  Pt educated via explanation, demonstration, and handout (HEP).  Pt confirms understanding verbally.   HOME EXERCISE PROGRAM: Access Code: [email or web address] URL: [email or web address] Date: 11/13/2022 Prepared by: Shearon Balo  Exercises - Seated Sciatic Tensioner  - 2 x daily - 7 x weekly - 20 reps - Hooklying Isometric Clamshell  - 1 x daily - 7 x weekly - 3 sets - 10 reps - Supine Hip Adduction Isometric with Ball  - 1 x daily - 7 x weekly - 2 sets - 10 reps - 10'' hold  ASTERISK SIGNS   Asterisk Signs Eval (11/13/2022)       Pain 6-10/10       Lumbar flexion To knees w/ P!       walking 15 min       HS length Sig limitation bil                 ASSESSMENT:  CLINICAL IMPRESSION: Jevante is a 73 y.o. male who presents to clinic with signs and sxs consistent with low back pain with radiating pain to bil LE L>R.  Consistent with MRI showing neural impingement.  Minimal neural tension signs on exam but his HS's are very tight.  Showed positive response to gentle PA mob of lower lumbar spine.  Overall strength is good.  OBJECTIVE IMPAIRMENTS: Pain, lumbar ROM, hip and core strength  ACTIVITY LIMITATIONS: walking, standing, bending, lifting  PERSONAL FACTORS: See medical history and pertinent history   REHAB POTENTIAL: Fair chronic  CLINICAL DECISION MAKING: Evolving/moderate complexity  EVALUATION COMPLEXITY: Low   GOALS:   SHORT TERM GOALS: Target date: 12/11/2022  Torre will be >75% HEP compliant to improve carryover between sessions and  facilitate independent management of condition  Evaluation (11/13/2022): ongoing Goal status: INITIAL   LONG TERM GOALS: Target date: 01/08/2023  Drayden will improve FOTO score to 60 as a proxy for functional improvement  Evaluation/Baseline (11/13/2022): 51 Goal status:  INITIAL   2.  Jordon will self report >/= 50% decrease in pain from evaluation   Evaluation/Baseline (11/13/2022): 10/10 max pain Goal status: INITIAL   3.  Nihal will be able to walk for 60 min, not limited by pain   Evaluation/Baseline (11/13/2022): 15 min Goal status: INITIAL   4.  Cale will be able to play pickle ball, not limited by pain  Evaluation/Baseline (11/13/2022): limited Goal status: INITIAL   5.  Issam will report confidence in self management of condition at time of discharge with advanced HEP  Evaluation/Baseline (11/13/2022): unable to self manage Goal status: INITIAL   PLAN: PT FREQUENCY: 1-2x/week  PT DURATION: 8 weeks (Ending 01/08/2023)  PLANNED INTERVENTIONS: Therapeutic exercises, Aquatic therapy, Therapeutic activity, Neuro Muscular re-education, Gait training, Patient/Family education, Joint mobilization, Dry Needling, Electrical stimulation, Spinal mobilization and/or manipulation, Moist heat, Taping, Vasopneumatic device, Ionotophoresis '4mg'$ /ml Dexamethasone, and Manual therapy  PLAN FOR NEXT SESSION: Neural mobility, PA lower lumbar spine, TDN?, core and hip strengthening   Shearon Balo PT, DPT 11/13/2022, 10:09 AM

## 2022-11-17 ENCOUNTER — Other Ambulatory Visit (HOSPITAL_COMMUNITY): Payer: Self-pay

## 2022-11-18 ENCOUNTER — Ambulatory Visit: Payer: Medicare Other

## 2022-11-18 DIAGNOSIS — M5459 Other low back pain: Secondary | ICD-10-CM | POA: Diagnosis not present

## 2022-11-18 DIAGNOSIS — M6281 Muscle weakness (generalized): Secondary | ICD-10-CM

## 2022-11-18 NOTE — Therapy (Signed)
OUTPATIENT PHYSICAL THERAPY TREATMENT NOTE   Patient Name: Troy [name] MRN: [id number] DOB:1951/03/19, 72 y.o., male Today's Date: 11/18/2022  PCP: Velna Hatchet, MD  REFERRING PROVIDER: Kary Kos, MD   END OF SESSION:   PT End of Session - 11/18/22 0954     Visit Number 2    Date for PT Re-Evaluation 01/08/23    Authorization Type MCR - FOTO    Progress Note Due on Visit 10    PT Start Time 1000    PT Stop Time 1041    PT Time Calculation (min) 41 min    Activity Tolerance Patient tolerated treatment well    Behavior During Therapy [facility] for tasks assessed/performed             Past Medical History:  Diagnosis Date   Aortic regurgitation    Mild by echo 07/2022   Arthritis    "knees" (01/02/2017)   Ascending aorta dilatation ([place])    66m by CT 2017 and 451mby CT 2019, 4336my echo 07/2020, 76m72m echo 2022, 41 mm by echo 2023   CAD (coronary artery disease)    a. s/p PCI in 2010 in [place] Unstable angina/LHC 8/8/3/7/1696plicated by V fib arrest after contrast injection. Cath 06/19/2015 s/p DES to mid LAD and RPDA. c. 11/2016: chest pain/ abnormal nuclear stress test prompting cath 11/13/16 showing 80% ostial diag, 60% mid RCA, no acute lesions, medical therapy recommended.   Dyslipidemia    Essential hypertension    Lung nodule < 6cm on CT 06/16/2015   Right lung   Mitral regurgitation    mild to moderate by echo 07/2020   Mitral regurgitation    Mild by echo 07/2022   OSA on CPAP    Persistent atrial fibrillation (HCC)[place] a. 06/2015 noted to be in new a-fib when arrived with unstable angina. Converted to NSR after being shocked in the cath lab for vfib;  b. 06/2015 Eliquis initiated as PAF noted on event monitor. c. Recurrent atrial fib?flutter 10/2016, issues with recurrent AF requiring multiple DCCVs in 02/2017. Failed sotalol, not candidate for Tikosyn due to cost/QTC, Multaq not felt likely strong enough.   Pulmonary nodule 07/2015   a. 1.5 x 1.2 cm smoothly  marginated nodule in the central aspect of the right lower lobe with recommendation correlation with nonemergent PET-CT to exclude a neoplasm , stable by CT 07/2016   Severe left ventricular hypertrophy    Ventricular fibrillation (HCC)[place] a. occured during cath 06/18/2015.   Past Surgical History:  Procedure Laterality Date   CARDIAC CATHETERIZATION N/A 06/18/2015   Procedure: Left Heart Cath and Coronary Angiography;  Surgeon: ChriBurnell Blanks;  [facility];  Service: Cardiovascular;  Laterality: N/A;   CARDIAC CATHETERIZATION N/A 06/19/2015   Procedure: Coronary Stent Intervention;  Surgeon: Peter M JordMartinique;  [facility];  Service: Cardiovascular;  Laterality: N/A;   CARDIAC CATHETERIZATION N/A 11/13/2016   Procedure: Left Heart Cath and Coronary Angiography;  Surgeon: JonaLorretta Harp;  [facility];  Service: Cardiovascular;  Laterality: N/A;   CARDIOVERSION N/A 11/17/2016   Procedure: CARDIOVERSION;  Surgeon: DaltLarey Dresser;  [facility];  Laterality: N/A;   CARDIOVERSION N/A 01/03/2017   Procedure: CARDIOVERSION;  Surgeon: BriaLelon Perla;  [facility];  Laterality: N/A;   CARDIOVERSION N/A 02/05/2017   Procedure: Cardioversion;  Surgeon:  Evans Lance, MD;  Location: [facility];  Service: Cardiovascular;  Laterality: N/A;   CARDIOVERSION N/A 02/20/2017   Procedure: Cardioversion;  Surgeon: Evans Lance, MD;  Location: [place] [facility];  Service: Cardiovascular;  Laterality: N/A;   CARTILAGE SURGERY Bilateral    "thumbs"   CATARACT EXTRACTION W/ INTRAOCULAR LENS  IMPLANT, BILATERAL Bilateral    CORONARY ANGIOPLASTY WITH STENT PLACEMENT  ~ 2007   JOINT REPLACEMENT     KNEE ARTHROSCOPY Right    RETINAL DETACHMENT SURGERY Bilateral    "laser thing"   TEE WITHOUT CARDIOVERSION N/A 11/17/2016   Procedure: TRANSESOPHAGEAL ECHOCARDIOGRAM (TEE);   Surgeon: Larey Dresser, MD;  Location: [facility];  Service: Cardiovascular;  Laterality: N/A;   TOTAL KNEE ARTHROPLASTY Left 2000s   TOTAL KNEE ARTHROPLASTY Right 02/08/2018   Procedure: RIGHT TOTAL KNEE ARTHROPLASTY;  Surgeon: Gaynelle Arabian, MD;  [facility];  Service: Orthopedics;  Laterality: Right;  with block   Patient Active Problem List   Diagnosis Date Noted   Aortic regurgitation 07/15/2022   Acute cystitis 11/17/2020   AKI (acute kidney injury) ([place]) 11/16/2020   Thrombocytopenia ([place]) 11/16/2020   Sepsis ([place]) 11/15/2020   Ascending aorta dilatation (HCC)    OA (osteoarthritis) of knee 02/08/2018   Environmental and seasonal allergies 07/06/2015   Pulmonary nodule 06/27/2015   Mitral regurgitation    Ventricular fibrillation ([place])    Hyperlipidemia 06/18/2015   CAD (coronary artery disease), native coronary artery 06/17/2015   Persistent atrial fibrillation with RVR ([place]), successful TEE DCCV 11/17/16 06/16/2015   Essential (primary) hypertension 04/11/2015   Abnormal presence of protein in urine 04/11/2015    REFERRING DIAG: Other intervertebral disc displacement, lumbar region [M51.26]    THERAPY DIAG:  Other low back pain  Muscle weakness  Rationale for Evaluation and Treatment Rehabilitation  PERTINENT HISTORY: Persistent afib   PRECAUTIONS: Chronic L2 pars defects with grade 1 anterolisthesis   SUBJECTIVE:                                                                                                                                                                                      SUBJECTIVE STATEMENT:  Patient reports that the gentle PAs from last session were very helpful. He reports HEP compliance.    PAIN:  Are you having pain? No Pain location: low back, L LE (mainly lateral lower leg) NPRS scale:  4/10 to 10/10 Aggravating factors: standing and walking 15 min Relieving factors: sitting or lying down Pain description: intermittent,  constant, burning, and aching Stage: Chronic Stability: staying the same 24 hour pattern: activity related    OBJECTIVE: (objective measures completed  at initial evaluation unless otherwise dated)   DIAGNOSTIC FINDINGS:    MRI:   IMPRESSION: 1. L4-5 disc extrusion with left lateral recess and left neural foraminal stenosis. 2. Chronic L2 pars defects with grade 1 anterolisthesis, severe disc degeneration, and mild-to-moderate neural foraminal stenosis.     SENSATION:          Light touch: Appears intact   MUSCLE LENGTH: Loss of lumbar lordosis Hamstrings: Right significant restriction; Left significant restriction ASLR: Right ASLR = PSLR; Left ASLR = PSLR Thomas test: Right subtle restriction; Left subtle restriction Ely's test: Right significant restriction; Left significant restriction     LUMBAR AROM   AROM AROM  11/13/2022  Flexion Fingertips to knees (limited by ~50%), w/ concordant pain  Extension limited by 50%, w/ concordant pain  Right lateral flexion limited by 50%, w/ concordant pain  Left lateral flexion limited by 50%, w/ concordant pain  Right rotation limited by 50%  Left rotation limited by 50%    (Blank rows = not tested)     LUMBAR SPECIAL TESTS:  Straight leg raise: L (-), R (-) Slump: L (equivocal), R (equivocal)   LE MMT:   MMT Right 11/13/2022 Left 11/13/2022  Hip flexion (L2, L3) 4 4  Knee extension (L3) 4 4  Knee flexion 4 4  Hip abduction 4+ 4+  Hip extension      Hip external rotation      Hip internal rotation      Hip adduction      Ankle dorsiflexion (L4)      Ankle plantarflexion (S1)      Ankle inversion      Ankle eversion      Great Toe ext (L5)      Grossly        (Blank rows = not tested, score listed is out of 5 possible points.  N = WNL, D = diminished, C = clear for gross weakness with myotome testing, * = concordant pain with testing)    PALPATION:            TTP lower lumbar spine.  PA lower lumbar spine -  concordant pain   PATIENT SURVEYS:  FOTO 51 -> 60     TODAY'S TREATMENT  Creating, reviewing, and completing below HEP   PATIENT EDUCATION:  POC, diagnosis, prognosis, HEP, and outcome measures.  Pt educated via explanation, demonstration, and handout (HEP).  Pt confirms understanding verbally.    HOME EXERCISE PROGRAM: Access Code: [email or web address] URL: [email or web address] Date: 11/13/2022 Prepared by: Shearon Balo   Exercises - Seated Sciatic Tensioner  - 2 x daily - 7 x weekly - 20 reps - Hooklying Isometric Clamshell  - 1 x daily - 7 x weekly - 3 sets - 10 reps - Supine Hip Adduction Isometric with Ball  - 1 x daily - 7 x weekly - 2 sets - 10 reps - 10'' hold   ASTERISK SIGNS     Asterisk Signs Eval (11/13/2022)            Pain 6-10/10            Lumbar flexion To knees w/ P!            walking 15 min            HS length Sig limitation bil                             TREATMENT  11/18/22: Therapeutic Exercise: - Nustep level 5 x 5 mins - Seated active hamstring stretch x1' BIL - PPT 5" hold 2x10 - Supine sciatic nerve glide x20 BIL - Supine hamstring stretch with strap x1' BIL - SKTC, opposite leg extended x30" BIL - Supine clamshell BlueTB 3x10 - Supine hip adduction ball squeeze 5" hold 3x10 - Supine 90/90 isometric hold 3x20" - Supine 90/90 with alternating heel taps 2x10 - Dead bugs (painful in lower back)    ASSESSMENT:   CLINICAL IMPRESSION: Patient presents to first follow up PT session reporting improvements in lower back from his evaluation. Session today focused on neutral spine core strengthening and stretching for BIL hamstrings. Patient was able to tolerate all prescribed exercises with no adverse effects. Patient continues to benefit from skilled PT services and should be progressed as able to improve functional independence.     OBJECTIVE IMPAIRMENTS: Pain, lumbar ROM, hip and core strength   ACTIVITY LIMITATIONS: walking, standing,  bending, lifting   PERSONAL FACTORS: See medical history and pertinent history     REHAB POTENTIAL: Fair chronic   CLINICAL DECISION MAKING: Evolving/moderate complexity   EVALUATION COMPLEXITY: Low     GOALS:     SHORT TERM GOALS: Target date: 12/11/2022   Jancarlos will be >75% HEP compliant to improve carryover between sessions and facilitate independent management of condition   Evaluation (11/13/2022): ongoing Goal status: INITIAL     LONG TERM GOALS: Target date: 01/08/2023   Kassidy will improve FOTO score to 60 as a proxy for functional improvement   Evaluation/Baseline (11/13/2022): 51 Goal status: INITIAL     2.  Yashar will self report >/= 50% decrease in pain from evaluation    Evaluation/Baseline (11/13/2022): 10/10 max pain Goal status: INITIAL     3.  Lenis will be able to walk for 60 min, not limited by pain    Evaluation/Baseline (11/13/2022): 15 min Goal status: INITIAL     4.  Quintavious will be able to play pickle ball, not limited by pain   Evaluation/Baseline (11/13/2022): limited Goal status: INITIAL     5.  Ramiro will report confidence in self management of condition at time of discharge with advanced HEP   Evaluation/Baseline (11/13/2022): unable to self manage Goal status: INITIAL     PLAN: PT FREQUENCY: 1-2x/week   PT DURATION: 8 weeks (Ending 01/08/2023)   PLANNED INTERVENTIONS: Therapeutic exercises, Aquatic therapy, Therapeutic activity, Neuro Muscular re-education, Gait training, Patient/Family education, Joint mobilization, Dry Needling, Electrical stimulation, Spinal mobilization and/or manipulation, Moist heat, Taping, Vasopneumatic device, Ionotophoresis '4mg'$ /ml Dexamethasone, and Manual therapy   PLAN FOR NEXT SESSION: Neural mobility, PA lower lumbar spine, TDN?, core and hip strengthening   Monty Spicher, PTA 11/18/2022, 10:43 AM

## 2022-11-19 ENCOUNTER — Ambulatory Visit: Payer: Medicare Other

## 2022-11-19 DIAGNOSIS — E785 Hyperlipidemia, unspecified: Secondary | ICD-10-CM | POA: Diagnosis not present

## 2022-11-19 DIAGNOSIS — M5136 Other intervertebral disc degeneration, lumbar region: Secondary | ICD-10-CM | POA: Diagnosis not present

## 2022-11-19 DIAGNOSIS — Z7901 Long term (current) use of anticoagulants: Secondary | ICD-10-CM | POA: Diagnosis not present

## 2022-11-19 DIAGNOSIS — I4891 Unspecified atrial fibrillation: Secondary | ICD-10-CM | POA: Diagnosis not present

## 2022-11-21 ENCOUNTER — Ambulatory Visit: Payer: Medicare Other | Admitting: Physical Therapy

## 2022-11-21 ENCOUNTER — Encounter: Payer: Self-pay | Admitting: Physical Therapy

## 2022-11-21 DIAGNOSIS — M6281 Muscle weakness (generalized): Secondary | ICD-10-CM | POA: Diagnosis not present

## 2022-11-21 DIAGNOSIS — M5459 Other low back pain: Secondary | ICD-10-CM | POA: Diagnosis not present

## 2022-11-21 NOTE — Therapy (Signed)
OUTPATIENT PHYSICAL THERAPY TREATMENT NOTE   Patient Name: Troy [name] MRN: [id number] DOB:Jan 22, 1951, 72 y.o., male Today's Date: 11/21/2022  PCP: Velna Hatchet, MD  REFERRING PROVIDER: Kary Kos, MD   END OF SESSION:   PT End of Session - 11/21/22 0917     Visit Number 3    Date for PT Re-Evaluation 01/08/23    Authorization Type MCR - FOTO    Progress Note Due on Visit 10    PT Start Time 0915    PT Stop Time 0956    PT Time Calculation (min) 41 min    Activity Tolerance Patient tolerated treatment well    Behavior During Therapy [facility] for tasks assessed/performed             Past Medical History:  Diagnosis Date   Aortic regurgitation    Mild by echo 07/2022   Arthritis    "knees" (01/02/2017)   Ascending aorta dilatation ([place])    39m by CT 2017 and 414mby CT 2019, 4365my echo 07/2020, 34m66m echo 2022, 41 mm by echo 2023   CAD (coronary artery disease)    [place] Unstable angina/LHC 8/8/7/5/6433plicated by V fib arrest after contrast injection. Cath 06/19/2015 s/p DES to mid LAD and RPDA. c. 11/2016: chest pain/ abnormal nuclear stress test prompting cath 11/13/16 showing 80% ostial diag, 60% mid RCA, no acute lesions, medical therapy recommended.   Dyslipidemia    Essential hypertension    Lung nodule < 6cm on CT 06/16/2015   Right lung   Mitral regurgitation    mild to moderate by echo 07/2020   Mitral regurgitation    Mild by echo 07/2022   OSA on CPAP    Persistent atrial fibrillation (HCC)[place] a. 06/2015 noted to be in new a-fib when arrived with unstable angina. Converted to NSR after being shocked in the cath lab for vfib;  b. 06/2015 Eliquis initiated as PAF noted on event monitor. c. Recurrent atrial fib?flutter 10/2016, issues with recurrent AF requiring multiple DCCVs in 02/2017. Failed sotalol, not candidate for Tikosyn due to cost/QTC, Multaq not felt likely strong enough.   Pulmonary nodule 07/2015   a. 1.5 x 1.2 cm  smoothly marginated nodule in the central aspect of the right lower lobe with recommendation correlation with nonemergent PET-CT to exclude a neoplasm , stable by CT 07/2016   Severe left ventricular hypertrophy    Ventricular fibrillation (HCC)[place] a. occured during cath 06/18/2015.   Past Surgical History:  Procedure Laterality Date   CARDIAC CATHETERIZATION N/A 06/18/2015   Procedure: Left Heart Cath and Coronary Angiography;  Surgeon: ChriBurnell Blanks;  [facility];  Service: Cardiovascular;  Laterality: N/A;   CARDIAC CATHETERIZATION N/A 06/19/2015   Procedure: Coronary Stent Intervention;  Surgeon: Peter M JordMartinique;  [facility];  Service: Cardiovascular;  Laterality: N/A;   CARDIAC CATHETERIZATION N/A 11/13/2016   Procedure: Left Heart Cath and Coronary Angiography;  Surgeon: JonaLorretta Harp;  [facility];  Service: Cardiovascular;  Laterality: N/A;   CARDIOVERSION N/A 11/17/2016   Procedure: CARDIOVERSION;  Surgeon: DaltLarey Dresser;  [facility]: Cardiovascular;  Laterality: N/A;   CARDIOVERSION N/A 01/03/2017   Procedure: CARDIOVERSION;  Surgeon: BriaLelon Perla;  [facility]: Cardiovascular;  Laterality: N/A;   CARDIOVERSION N/A 02/05/2017   Procedure: Cardioversion;  Surgeon:  Evans Lance, MD;  Location: [place] [facility];  Service: Cardiovascular;  Laterality: N/A;   CARDIOVERSION N/A 02/20/2017   Procedure: Cardioversion;  Surgeon: Evans Lance, MD;  Location: [place] [facility];  Service: Cardiovascular;  Laterality: N/A;   CARTILAGE SURGERY Bilateral    "thumbs"   CATARACT EXTRACTION W/ INTRAOCULAR LENS  IMPLANT, BILATERAL Bilateral    CORONARY ANGIOPLASTY WITH STENT PLACEMENT  ~ 2007   JOINT REPLACEMENT     KNEE ARTHROSCOPY Right    RETINAL DETACHMENT SURGERY Bilateral    "laser thing"   TEE WITHOUT CARDIOVERSION N/A 11/17/2016   Procedure: TRANSESOPHAGEAL ECHOCARDIOGRAM  (TEE);  Surgeon: Larey Dresser, MD;  Location: [place];  Service: Cardiovascular;  Laterality: N/A;   TOTAL KNEE ARTHROPLASTY Left 2000s   TOTAL KNEE ARTHROPLASTY Right 02/08/2018   Procedure: RIGHT TOTAL KNEE ARTHROPLASTY;  Surgeon: Gaynelle Arabian, MD;  [facility];  Service: Orthopedics;  Laterality: Right;  with block   Patient Active Problem List   Diagnosis Date Noted   Aortic regurgitation 07/15/2022   Acute cystitis 11/17/2020   AKI (acute kidney injury) ([place]) 11/16/2020   Thrombocytopenia ([place]) 11/16/2020   Sepsis ([place]) 11/15/2020   Ascending aorta dilatation (HCC)    OA (osteoarthritis) of knee 02/08/2018   Environmental and seasonal allergies 07/06/2015   Pulmonary nodule 06/27/2015   Mitral regurgitation    Ventricular fibrillation ([place])    Hyperlipidemia 06/18/2015   CAD (coronary artery disease), native coronary artery 06/17/2015   Persistent atrial fibrillation with RVR ([place]), successful TEE DCCV 11/17/16 06/16/2015   Essential (primary) hypertension 04/11/2015   Abnormal presence of protein in urine 04/11/2015    REFERRING DIAG: Other intervertebral disc displacement, lumbar region [M51.26]    THERAPY DIAG:  Other low back pain  Muscle weakness  Rationale for Evaluation and Treatment Rehabilitation  PERTINENT HISTORY: Persistent afib   PRECAUTIONS: Chronic L2 pars defects with grade 1 anterolisthesis   SUBJECTIVE:                                                                                                                                                                                      SUBJECTIVE STATEMENT:  Pt reports that he had good relief with PA mobs on eval his current back pain is 6/10.  He has been HEP compliant.   PAIN:  Are you having pain? No Pain location: low back, L LE (mainly lateral lower leg) NPRS scale:  4/10 to 10/10 Aggravating factors: standing and walking 15 min Relieving factors: sitting or lying down Pain  description: intermittent, constant, burning, and aching Stage: Chronic Stability: staying the same 24 hour pattern: activity related  OBJECTIVE: (objective measures completed at initial evaluation unless otherwise dated)   DIAGNOSTIC FINDINGS:    MRI:   IMPRESSION: 1. L4-5 disc extrusion with left lateral recess and left neural foraminal stenosis. 2. Chronic L2 pars defects with grade 1 anterolisthesis, severe disc degeneration, and mild-to-moderate neural foraminal stenosis.     SENSATION:          Light touch: Appears intact   MUSCLE LENGTH: Loss of lumbar lordosis Hamstrings: Right significant restriction; Left significant restriction ASLR: Right ASLR = PSLR; Left ASLR = PSLR Thomas test: Right subtle restriction; Left subtle restriction Ely's test: Right significant restriction; Left significant restriction     LUMBAR AROM   AROM AROM  11/13/2022  Flexion Fingertips to knees (limited by ~50%), w/ concordant pain  Extension limited by 50%, w/ concordant pain  Right lateral flexion limited by 50%, w/ concordant pain  Left lateral flexion limited by 50%, w/ concordant pain  Right rotation limited by 50%  Left rotation limited by 50%    (Blank rows = not tested)     LUMBAR SPECIAL TESTS:  Straight leg raise: L (-), R (-) Slump: L (equivocal), R (equivocal)   LE MMT:   MMT Right 11/13/2022 Left 11/13/2022  Hip flexion (L2, L3) 4 4  Knee extension (L3) 4 4  Knee flexion 4 4  Hip abduction 4+ 4+  Hip extension      Hip external rotation      Hip internal rotation      Hip adduction      Ankle dorsiflexion (L4)      Ankle plantarflexion (S1)      Ankle inversion      Ankle eversion      Great Toe ext (L5)      Grossly        (Blank rows = not tested, score listed is out of 5 possible points.  N = WNL, D = diminished, C = clear for gross weakness with myotome testing, * = concordant pain with testing)    PALPATION:            TTP lower lumbar spine.  PA  lower lumbar spine - concordant pain   PATIENT SURVEYS:  FOTO 51 -> 60     TODAY'S TREATMENT  Creating, reviewing, and completing below HEP   PATIENT EDUCATION:  POC, diagnosis, prognosis, HEP, and outcome measures.  Pt educated via explanation, demonstration, and handout (HEP).  Pt confirms understanding verbally.    HOME EXERCISE PROGRAM: Access Code: [email or web address] URL: [email or web address] Date: 11/21/2022 Prepared by: Shearon Balo  Exercises - Seated Sciatic Tensioner  - 2 x daily - 7 x weekly - 20 reps - Hooklying Isometric Clamshell  - 1 x daily - 7 x weekly - 3 sets - 10 reps - Supine Hip Adduction Isometric with Ball  - 1 x daily - 7 x weekly - 2 sets - 10 reps - 10'' hold - Bridge  - 1 x daily - 7 x weekly - 2 sets - 10 reps - 5-10'' hold   ASTERISK SIGNS     Asterisk Signs Eval (11/13/2022)            Pain 6-10/10            Lumbar flexion To knees w/ P!            walking 15 min            HS length Sig limitation bil  TREATMENT 11/18/22: Therapeutic Exercise: - Nustep level 5 x 5 mins - PPT 5" hold 2x10 - Supine sciatic nerve glide x20 BIL - alternating SLR - 2x10 ea - Supine alternating  clamshell BlueTB 3x10 - Supine hip adduction ball squeeze 5" hold 3x10 - bridge - 3'' - 2x10 - isometric swiss ball squeeze (add next visit)  Manual Therapy  - PA and UPA L4-L5 G III - G IV, to start and end session    ASSESSMENT:   CLINICAL IMPRESSION: Owen tolerated session well with no adverse reaction.  Responds very well to manual therapy.  I let him know if his SO wants to come in we can show her how to do gentle mobs of his low back.  Tolerates core exercises well with no increase in sxs.  Cued for form and pacing throughout.    OBJECTIVE IMPAIRMENTS: Pain, lumbar ROM, hip and core strength   ACTIVITY LIMITATIONS: walking, standing, bending, lifting   PERSONAL FACTORS: See medical history and pertinent history      REHAB POTENTIAL: Fair chronic   CLINICAL DECISION MAKING: Evolving/moderate complexity   EVALUATION COMPLEXITY: Low     GOALS:     SHORT TERM GOALS: Target date: 12/11/2022   Amritpal will be >75% HEP compliant to improve carryover between sessions and facilitate independent management of condition   Evaluation (11/13/2022): ongoing Goal status: INITIAL     LONG TERM GOALS: Target date: 01/08/2023   Arush will improve FOTO score to 60 as a proxy for functional improvement   Evaluation/Baseline (11/13/2022): 51 Goal status: INITIAL     2.  Aikam will self report >/= 50% decrease in pain from evaluation    Evaluation/Baseline (11/13/2022): 10/10 max pain Goal status: INITIAL     3.  Marsh will be able to walk for 60 min, not limited by pain    Evaluation/Baseline (11/13/2022): 15 min Goal status: INITIAL     4.  Harlis will be able to play pickle ball, not limited by pain   Evaluation/Baseline (11/13/2022): limited Goal status: INITIAL     5.  Laddie will report confidence in self management of condition at time of discharge with advanced HEP   Evaluation/Baseline (11/13/2022): unable to self manage Goal status: INITIAL     PLAN: PT FREQUENCY: 1-2x/week   PT DURATION: 8 weeks (Ending 01/08/2023)   PLANNED INTERVENTIONS: Therapeutic exercises, Aquatic therapy, Therapeutic activity, Neuro Muscular re-education, Gait training, Patient/Family education, Joint mobilization, Dry Needling, Electrical stimulation, Spinal mobilization and/or manipulation, Moist heat, Taping, Vasopneumatic device, Ionotophoresis '4mg'$ /ml Dexamethasone, and Manual therapy   PLAN FOR NEXT SESSION: Neural mobility, PA lower lumbar spine, TDN?, core and hip strengthening   Mathis Dad, PT 11/21/2022, 9:58 AM

## 2022-11-26 ENCOUNTER — Ambulatory Visit: Payer: Medicare Other

## 2022-11-26 DIAGNOSIS — M5459 Other low back pain: Secondary | ICD-10-CM

## 2022-11-26 DIAGNOSIS — M6281 Muscle weakness (generalized): Secondary | ICD-10-CM

## 2022-11-26 NOTE — Therapy (Signed)
OUTPATIENT PHYSICAL THERAPY TREATMENT NOTE   Patient Name: Troy [name] MRN: [id number] DOB:[birth year], 72 y.o., male Today's Date: 11/26/2022  PCP: Velna Hatchet, MD  REFERRING PROVIDER: Kary Kos, MD   END OF SESSION:   PT End of Session - 11/26/22 0907     Visit Number 4    Date for PT Re-Evaluation 01/08/23    Authorization Type MCR - FOTO    Progress Note Due on Visit 10    PT Start Time 0915    PT Stop Time 0955    PT Time Calculation (min) 40 min    Activity Tolerance Patient tolerated treatment well    Behavior During Therapy [facility] for tasks assessed/performed              Past Medical History:  Diagnosis Date   Aortic regurgitation    Mild by echo 07/2022   Arthritis    "knees" (01/02/2017)   Ascending aorta dilatation ([place])    50m by CT 2017 and 45mby CT 2019, 431my echo 07/2020, 59m17m echo 2022, 41 mm by echo 2023   CAD (coronary artery disease)    a. [place] PCI in 2010 [place] Unstable angina/LHC 8/8/2/7/0350plicated by V fib arrest after contrast injection. Cath 06/19/2015 s/p DES to mid LAD and RPDA. c. 11/2016: chest pain/ abnormal nuclear stress test prompting cath 11/13/16 showing 80% ostial diag, 60% mid RCA, no acute lesions, medical therapy recommended.   Dyslipidemia    Essential hypertension    Lung nodule < 6cm on CT 06/16/2015   Right lung   Mitral regurgitation    mild to moderate by echo 07/2020   Mitral regurgitation    Mild by echo 07/2022   OSA on CPAP    Persistent atrial fibrillation (HCC)[place] a. 06/2015 noted to be in new a-fib when arrived with unstable angina. Converted to NSR after being shocked in the cath lab for vfib;  b. 06/2015 Eliquis initiated as PAF noted on event monitor. c. Recurrent atrial fib?flutter 10/2016, issues with recurrent AF requiring multiple DCCVs in 02/2017. Failed sotalol, not candidate for Tikosyn due to cost/QTC, Multaq not felt likely strong enough.   Pulmonary nodule 07/2015   a. 1.5 x 1.2 cm  smoothly marginated nodule in the central aspect of the right lower lobe with recommendation correlation with nonemergent PET-CT to exclude a neoplasm , stable by CT 07/2016   Severe left ventricular hypertrophy    Ventricular fibrillation (HCC)[place] a. occured during cath 06/18/2015.   Past Surgical History:  Procedure Laterality Date   CARDIAC CATHETERIZATION N/A 06/18/2015   Procedure: Left Heart Cath and Coronary Angiography;  Surgeon: ChriBurnell Blanks;  [facility];  Service: Cardiovascular;  Laterality: N/A;   CARDIAC CATHETERIZATION N/A 06/19/2015   Procedure: Coronary Stent Intervention;  Surgeon: Peter M JordMartinique;  [facility];  Service: Cardiovascular;  Laterality: N/A;   CARDIAC CATHETERIZATION N/A 11/13/2016   Procedure: Left Heart Cath and Coronary Angiography;  Surgeon: JonaLorretta Harp;  [facility];  Service: Cardiovascular;  Laterality: N/A;   CARDIOVERSION N/A 11/17/2016   Procedure: CARDIOVERSION;  Surgeon: DaltLarey Dresser;  [facility];  Laterality: N/A;   CARDIOVERSION N/A 01/03/2017   Procedure: CARDIOVERSION;  Surgeon: BriaLelon Perla;  [facility] Cardi[facility];  Laterality: N/A;   CARDIOVERSION N/A 02/05/2017   Procedure: Cardioversion;  Surgeon: Evans Lance, MD;  Location: [place] [facility];  Service: Cardiovascular;  Laterality: N/A;   CARDIOVERSION N/A 02/20/2017   Procedure: Cardioversion;  Surgeon: Evans Lance, MD;  Location: [place] [facility];  Service: Cardiovascular;  Laterality: N/A;   CARTILAGE SURGERY Bilateral    "thumbs"   CATARACT EXTRACTION W/ INTRAOCULAR LENS  IMPLANT, BILATERAL Bilateral    CORONARY ANGIOPLASTY WITH STENT PLACEMENT  ~ 2007   JOINT REPLACEMENT     KNEE ARTHROSCOPY Right    RETINAL DETACHMENT SURGERY Bilateral    "laser thing"   TEE WITHOUT CARDIOVERSION N/A 11/17/2016   Procedure: TRANSESOPHAGEAL ECHOCARDIOGRAM  (TEE);  Surgeon: Larey Dresser, MD;  Location: [place];  Service: Cardiovascular;  Laterality: N/A;   TOTAL KNEE ARTHROPLASTY Left 2000s   TOTAL KNEE ARTHROPLASTY Right 02/08/2018   Procedure: RIGHT TOTAL KNEE ARTHROPLASTY;  Surgeon: Gaynelle Arabian, MD;  [facility];  Service: Orthopedics;  Laterality: Right;  with block   Patient Active Problem List   Diagnosis Date Noted   Aortic regurgitation 07/15/2022   Acute cystitis 11/17/2020   AKI (acute kidney injury) ([place]) 11/16/2020   Thrombocytopenia ([place]) 11/16/2020   Sepsis ([place]) 11/15/2020   Ascending aorta dilatation (HCC)    OA (osteoarthritis) of knee 02/08/2018   Environmental and seasonal allergies 07/06/2015   Pulmonary nodule 06/27/2015   Mitral regurgitation    Ventricular fibrillation ([place])    Hyperlipidemia 06/18/2015   CAD (coronary artery disease), native coronary artery 06/17/2015   Persistent atrial fibrillation with RVR ([place]), successful TEE DCCV 11/17/16 06/16/2015   Essential (primary) hypertension 04/11/2015   Abnormal presence of protein in urine 04/11/2015    REFERRING DIAG: Other intervertebral disc displacement, lumbar region [M51.26]    THERAPY DIAG:  Other low back pain  Muscle weakness  Rationale for Evaluation and Treatment Rehabilitation  PERTINENT HISTORY: Persistent afib   PRECAUTIONS: Chronic L2 pars defects with grade 1 anterolisthesis   SUBJECTIVE:                                                                                                                                                                                      SUBJECTIVE STATEMENT:  Patient reports that a couple days ago he picked something up from the floor and that his back started hurting more.    PAIN:  Are you having pain? Yes Pain location: low back, L LE (mainly lateral lower leg) NPRS scale:  7/10 Aggravating factors: standing and walking 15 min Relieving factors: sitting or lying down Pain description:  intermittent, constant, burning, and aching Stage: Chronic Stability: staying the same 24 hour pattern: activity related  OBJECTIVE: (objective measures completed at initial evaluation unless otherwise dated)   DIAGNOSTIC FINDINGS:    MRI:   IMPRESSION: 1. L4-5 disc extrusion with left lateral recess and left neural foraminal stenosis. 2. Chronic L2 pars defects with grade 1 anterolisthesis, severe disc degeneration, and mild-to-moderate neural foraminal stenosis.     SENSATION:          Light touch: Appears intact   MUSCLE LENGTH: Loss of lumbar lordosis Hamstrings: Right significant restriction; Left significant restriction ASLR: Right ASLR = PSLR; Left ASLR = PSLR Thomas test: Right subtle restriction; Left subtle restriction Ely's test: Right significant restriction; Left significant restriction     LUMBAR AROM   AROM AROM  11/13/2022  Flexion Fingertips to knees (limited by ~50%), w/ concordant pain  Extension limited by 50%, w/ concordant pain  Right lateral flexion limited by 50%, w/ concordant pain  Left lateral flexion limited by 50%, w/ concordant pain  Right rotation limited by 50%  Left rotation limited by 50%    (Blank rows = not tested)     LUMBAR SPECIAL TESTS:  Straight leg raise: L (-), R (-) Slump: L (equivocal), R (equivocal)   LE MMT:   MMT Right 11/13/2022 Left 11/13/2022  Hip flexion (L2, L3) 4 4  Knee extension (L3) 4 4  Knee flexion 4 4  Hip abduction 4+ 4+  Hip extension      Hip external rotation      Hip internal rotation      Hip adduction      Ankle dorsiflexion (L4)      Ankle plantarflexion (S1)      Ankle inversion      Ankle eversion      Great Toe ext (L5)      Grossly        (Blank rows = not tested, score listed is out of 5 possible points.  N = WNL, D = diminished, C = clear for gross weakness with myotome testing, * = concordant pain with testing)    PALPATION:            TTP lower lumbar spine.  PA lower lumbar  spine - concordant pain   PATIENT SURVEYS:  FOTO 51 -> 60     TODAY'S TREATMENT  Creating, reviewing, and completing below HEP   PATIENT EDUCATION:  POC, diagnosis, prognosis, HEP, and outcome measures.  Pt educated via explanation, demonstration, and handout (HEP).  Pt confirms understanding verbally.    HOME EXERCISE PROGRAM: Access Code: [email or web address] URL: [email or web address] Date: 11/21/2022 Prepared by: Shearon Balo  Exercises - Seated Sciatic Tensioner  - 2 x daily - 7 x weekly - 20 reps - Hooklying Isometric Clamshell  - 1 x daily - 7 x weekly - 3 sets - 10 reps - Supine Hip Adduction Isometric with Ball  - 1 x daily - 7 x weekly - 2 sets - 10 reps - 10'' hold - Bridge  - 1 x daily - 7 x weekly - 2 sets - 10 reps - 5-10'' hold Added 11/26/22 - Supine Sciatic Nerve Glide  - 2 x daily - 7 x weekly - 20 reps - Supine Active Straight Leg Raise  - 1 x daily - 7 x weekly - 3 sets - 10 reps   ASTERISK SIGNS     Asterisk Signs Eval (11/13/2022)            Pain 6-10/10            Lumbar flexion  To knees w/ P!            walking 15 min            HS length Sig limitation bil                            TREATMENT 11/26/22:  Therapeutic Exercise: - Nustep level 5 x 5 mins - Seated hamstring stretch 2x30" BIL - Palloff press 7# x10 BIL - PPT 5" hold 2x10 - Supine sciatic nerve glide x20 BIL - Supine figure 4 piriformis stretch 2x30" - alternating SLR - 2x10 ea - Supine alternating clamshell BlueTB 3x10 - Supine hip adduction ball squeeze 5" hold 3x10 - bridge - 3'' - 2x10 - Seated isometric swiss ball abdominal press down 5" hold 2x10  TREATMENT 11/18/22: Therapeutic Exercise: - Nustep level 5 x 5 mins - PPT 5" hold 2x10 - Supine sciatic nerve glide x20 BIL - alternating SLR - 2x10 ea - Supine alternating  clamshell BlueTB 3x10 - Supine hip adduction ball squeeze 5" hold 3x10 - bridge - 3'' - 2x10 - isometric swiss ball squeeze (add next visit)  Manual  Therapy  - PA and UPA L4-L5 G III - G IV, to start and end session    ASSESSMENT:   CLINICAL IMPRESSION: Patient presents to PT with continued reports of pain in his lower back and states that the manipulations from last session were helpful. Session today continued to focus on proximal hip and core strengthening. Updated HEP this session at patient request, he demonstrated understanding of each additional exercise. Patient was able to tolerate all prescribed exercises with no adverse effects. Patient continues to benefit from skilled PT services and should be progressed as able to improve functional independence.    OBJECTIVE IMPAIRMENTS: Pain, lumbar ROM, hip and core strength   ACTIVITY LIMITATIONS: walking, standing, bending, lifting   PERSONAL FACTORS: See medical history and pertinent history     REHAB POTENTIAL: Fair chronic   CLINICAL DECISION MAKING: Evolving/moderate complexity   EVALUATION COMPLEXITY: Low     GOALS:     SHORT TERM GOALS: Target date: 12/11/2022   [place] will be >75% HEP compliant to improve carryover between sessions and facilitate independent management of condition   Evaluation (11/13/2022): ongoing Goal status: INITIAL     LONG TERM GOALS: Target date: 01/08/2023   Marselino will improve FOTO score to 60 as a proxy for functional improvement   Evaluation/Baseline (11/13/2022): 51 Goal status: INITIAL     2.  Braelen will self report >/= 50% decrease in pain from evaluation    Evaluation/Baseline (11/13/2022): 10/10 max pain Goal status: INITIAL     3.  Ezzard will be able to walk for 60 min, not limited by pain    Evaluation/Baseline (11/13/2022): 15 min Goal status: INITIAL     4.  Rico will be able to play pickle ball, not limited by pain   Evaluation/Baseline (11/13/2022): limited Goal status: INITIAL     5.  Garvin will report confidence in self management of condition at time of discharge with advanced HEP   Evaluation/Baseline  (11/13/2022): unable to self manage Goal status: INITIAL     PLAN: PT FREQUENCY: 1-2x/week   PT DURATION: 8 weeks (Ending 01/08/2023)   PLANNED INTERVENTIONS: Therapeutic exercises, Aquatic therapy, Therapeutic activity, Neuro Muscular re-education, Gait training, Patient/Family education, Joint mobilization, Dry Needling, Electrical stimulation, Spinal mobilization and/or manipulation, Moist heat, Taping, Vasopneumatic device, Ionotophoresis '4mg'$ /ml  Dexamethasone, and Manual therapy   PLAN FOR NEXT SESSION: Neural mobility, PA lower lumbar spine, TDN?, core and hip strengthening   Camarion Weier, PTA 11/26/2022, 9:54 AM

## 2022-11-27 DIAGNOSIS — M5126 Other intervertebral disc displacement, lumbar region: Secondary | ICD-10-CM | POA: Diagnosis not present

## 2022-11-28 ENCOUNTER — Ambulatory Visit: Payer: Medicare Other | Admitting: Physical Therapy

## 2022-11-28 ENCOUNTER — Encounter: Payer: Self-pay | Admitting: Physical Therapy

## 2022-11-28 DIAGNOSIS — M5459 Other low back pain: Secondary | ICD-10-CM | POA: Diagnosis not present

## 2022-11-28 DIAGNOSIS — M6281 Muscle weakness (generalized): Secondary | ICD-10-CM | POA: Diagnosis not present

## 2022-11-28 NOTE — Therapy (Signed)
OUTPATIENT PHYSICAL THERAPY TREATMENT NOTE   Patient Name: Troy [name] MRN: [id number] DOB:10-27-1951, 72 y.o., male Today's Date: 11/28/2022  PCP: Velna Hatchet, MD  REFERRING PROVIDER: Kary Kos, MD   END OF SESSION:   PT End of Session - 11/28/22 1115     Visit Number 5    Date for PT Re-Evaluation 01/08/23    Authorization Type MCR - FOTO    Progress Note Due on Visit 10    PT Start Time 1115    PT Stop Time 1156    PT Time Calculation (min) 41 min    Activity Tolerance Patient tolerated treatment well    Behavior During Therapy [facility] for tasks assessed/performed              Past Medical History:  Diagnosis Date   Aortic regurgitation    Mild by echo 07/2022   Arthritis    "knees" (01/02/2017)   Ascending aorta dilatation ([place])    36m by CT 2017 and 49mby CT 2019, 4376my echo 07/2020, 76m19m echo 2022, 41 mm by echo 2023   CAD (coronary artery disease)    a[place] Unstable angina/LHC 8/8/6/2/1308plicated by V fib arrest after contrast injection. Cath 06/19/2015 s/p DES to mid LAD and RPDA. c. 11/2016: chest pain/ abnormal nuclear stress test prompting cath 11/13/16 showing 80% ostial diag, 60% mid RCA, no acute lesions, medical therapy recommended.   Dyslipidemia    Essential hypertension    Lung nodule < 6cm on CT 06/16/2015   Right lung   Mitral regurgitation    mild to moderate by echo 07/2020   Mitral regurgitation    Mild by echo 07/2022   OSA on CPAP    Persistent atrial fibrillation (HCC)[place] a. 06/2015 noted to be in new a-fib when arrived with unstable angina. Converted to NSR after being shocked in the cath lab for vfib;  b. 06/2015 Eliquis initiated as PAF noted on event monitor. c. Recurrent atrial fib?flutter 10/2016, issues with recurrent AF requiring multiple DCCVs in 02/2017. Failed sotalol, not candidate for Tikosyn due to cost/QTC, Multaq not felt likely strong enough.   Pulmonary nodule 07/2015   a. 1.5 x 1.2 cm  smoothly marginated nodule in the central aspect of the right lower lobe with recommendation correlation with nonemergent PET-CT to exclude a neoplasm , stable by CT 07/2016   Severe left ventricular hypertrophy    Ventricular fibrillation (HCC)[place] a. occured during cath 06/18/2015.   Past Surgical History:  Procedure Laterality Date   CARDIAC CATHETERIZATION N/A 06/18/2015   Procedure: Left Heart Cath and Coronary Angiography;  Surgeon: ChriBurnell Blanks;  [facility];  Service: Cardiovascular;  Laterality: N/A;   CARDIAC CATHETERIZATION N/A 06/19/2015   Procedure: Coronary Stent Intervention;  Surgeon: Peter M JordMartinique;  [facility];  Service: Cardiovascular;  Laterality: N/A;   CARDIAC CATHETERIZATION N/A 11/13/2016   Procedure: Left Heart Cath and Coronary Angiography;  Surgeon: JonaLorretta Harp;  [facility];  Service: Cardiovascular;  Laterality: N/A;   CARDIOVERSION N/A 11/17/2016   Procedure: CARDIOVERSION;  Surgeon: DaltLarey Dresser;  [facility];  Laterality: N/A;   CARDIOVERSION N/A 01/03/2017   Procedure: CARDIOVERSION;  Surgeon: BriaLelon Perla;  [facility];  Laterality: N/A;   CARDIOVERSION N/A 02/05/2017   Procedure: Cardioversion;  Surgeon: Evans Lance, MD;  Location: [place] [facility];  Service: Cardiovascular;  Laterality: N/A;   CARDIOVERSION N/A 02/20/2017   Procedure: Cardioversion;  Surgeon: Evans Lance, MD;  Location: [place] [facility];  Service: Cardiovascular;  Laterality: N/A;   CARTILAGE SURGERY Bilateral    "thumbs"   CATARACT EXTRACTION W/ INTRAOCULAR LENS  IMPLANT, BILATERAL Bilateral    CORONARY ANGIOPLASTY WITH STENT PLACEMENT  ~ 2007   JOINT REPLACEMENT     KNEE ARTHROSCOPY Right    RETINAL DETACHMENT SURGERY Bilateral    "laser thing"   TEE WITHOUT CARDIOVERSION N/A 11/17/2016   Procedure: TRANSESOPHAGEAL ECHOCARDIOGRAM  (TEE);  Surgeon: Larey Dresser, MD;  Location: [place];  Service: Cardiovascular;  Laterality: N/A;   TOTAL KNEE ARTHROPLASTY Left 2000s   TOTAL KNEE ARTHROPLASTY Right 02/08/2018   Procedure: RIGHT TOTAL KNEE ARTHROPLASTY;  Surgeon: Gaynelle Arabian, MD;  Location: [facility];  Service: Orthopedics;  Laterality: Right;  with block   Patient Active Problem List   Diagnosis Date Noted   Aortic regurgitation 07/15/2022   Acute cystitis 11/17/2020   AKI (acute kidney injury) ([place]) 11/16/2020   Thrombocytopenia ([place]) 11/16/2020   Sepsis ([place]) 11/15/2020   Ascending aorta dilatation (HCC)    OA (osteoarthritis) of knee 02/08/2018   Environmental and seasonal allergies 07/06/2015   Pulmonary nodule 06/27/2015   Mitral regurgitation    Ventricular fibrillation ([place])    Hyperlipidemia 06/18/2015   CAD (coronary artery disease), native coronary artery 06/17/2015   Persistent atrial fibrillation with RVR ([place]), successful TEE DCCV 11/17/16 06/16/2015   Essential (primary) hypertension 04/11/2015   Abnormal presence of protein in urine 04/11/2015    REFERRING DIAG: Other intervertebral disc displacement, lumbar region [M51.26]    THERAPY DIAG:  Other low back pain  Muscle weakness  Rationale for Evaluation and Treatment Rehabilitation  PERTINENT HISTORY: Persistent afib   PRECAUTIONS: Chronic L2 pars defects with grade 1 anterolisthesis   SUBJECTIVE:                                                                                                                                                                                      SUBJECTIVE STATEMENT:    Pt states that he has been having an increase in his low back pain.  He states that he did play pickle ball which may play a role.   PAIN:  Are you having pain? Yes Pain location: low back, L LE (mainly lateral lower leg) NPRS scale:  7/10 Aggravating factors: standing and walking 15 min Relieving factors: sitting or lying  down Pain description: intermittent, constant, burning, and aching Stage: Chronic Stability: staying the same  24 hour pattern: activity related    OBJECTIVE: (objective measures completed at initial evaluation unless otherwise dated)   DIAGNOSTIC FINDINGS:    MRI:   IMPRESSION: 1. L4-5 disc extrusion with left lateral recess and left neural foraminal stenosis. 2. Chronic L2 pars defects with grade 1 anterolisthesis, severe disc degeneration, and mild-to-moderate neural foraminal stenosis.     SENSATION:          Light touch: Appears intact   MUSCLE LENGTH: Loss of lumbar lordosis Hamstrings: Right significant restriction; Left significant restriction ASLR: Right ASLR = PSLR; Left ASLR = PSLR Thomas test: Right subtle restriction; Left subtle restriction Ely's test: Right significant restriction; Left significant restriction     LUMBAR AROM   AROM AROM  11/13/2022  Flexion Fingertips to knees (limited by ~50%), w/ concordant pain  Extension limited by 50%, w/ concordant pain  Right lateral flexion limited by 50%, w/ concordant pain  Left lateral flexion limited by 50%, w/ concordant pain  Right rotation limited by 50%  Left rotation limited by 50%    (Blank rows = not tested)     LUMBAR SPECIAL TESTS:  Straight leg raise: L (-), R (-) Slump: L (equivocal), R (equivocal)   LE MMT:   MMT Right 11/13/2022 Left 11/13/2022  Hip flexion (L2, L3) 4 4  Knee extension (L3) 4 4  Knee flexion 4 4  Hip abduction 4+ 4+  Hip extension      Hip external rotation      Hip internal rotation      Hip adduction      Ankle dorsiflexion (L4)      Ankle plantarflexion (S1)      Ankle inversion      Ankle eversion      Great Toe ext (L5)      Grossly        (Blank rows = not tested, score listed is out of 5 possible points.  N = WNL, D = diminished, C = clear for gross weakness with myotome testing, * = concordant pain with testing)    PALPATION:            TTP lower lumbar  spine.  PA lower lumbar spine - concordant pain   PATIENT SURVEYS:  FOTO 51 -> 60     TODAY'S TREATMENT  Creating, reviewing, and completing below HEP   PATIENT EDUCATION:  POC, diagnosis, prognosis, HEP, and outcome measures.  Pt educated via explanation, demonstration, and handout (HEP).  Pt confirms understanding verbally.    HOME EXERCISE PROGRAM: Access Code: [email or web address] URL: [email or web address] Date: 11/21/2022 Prepared by: Shearon Balo  Exercises - Seated Sciatic Tensioner  - 2 x daily - 7 x weekly - 20 reps - Hooklying Isometric Clamshell  - 1 x daily - 7 x weekly - 3 sets - 10 reps - Supine Hip Adduction Isometric with Ball  - 1 x daily - 7 x weekly - 2 sets - 10 reps - 10'' hold - Bridge  - 1 x daily - 7 x weekly - 2 sets - 10 reps - 5-10'' hold Added 11/26/22 - Supine Sciatic Nerve Glide  - 2 x daily - 7 x weekly - 20 reps - Supine Active Straight Leg Raise  - 1 x daily - 7 x weekly - 3 sets - 10 reps   ASTERISK SIGNS     Asterisk Signs Eval (11/13/2022)            Pain 6-10/10  Lumbar flexion To knees w/ P!            walking 15 min            HS length Sig limitation bil                             TREATMENT: Therapeutic Exercise: - Nustep level 5 x 5 mins - PPT 5" hold x10  - w/ belt resistance - Supine sciatic nerve glide x20 BIL - alternating SLR - 2x10 ea - Supine alternating  clamshell BlueTB 3x10 - bridge - 5'' - 8x - isometric swiss ball squeeze - 5'' 2x10  Manual Therapy  - PA and UPA L4-L5 G III - G IV, to start and end session - instructing patient SO on G I and G II mobs    ASSESSMENT:   CLINICAL IMPRESSION: Quandarius tolerated session well with no adverse reaction.  Pt continues to respond well to PA mobilizations.  SO instructed on gentle lower lumbar mobilization for pain relief (Grade 1-2).  Bridge d/c'd d/t increase in pain today.  Pt reports significant pain reduction following therapy.   OBJECTIVE  IMPAIRMENTS: Pain, lumbar ROM, hip and core strength   ACTIVITY LIMITATIONS: walking, standing, bending, lifting   PERSONAL FACTORS: See medical history and pertinent history     REHAB POTENTIAL: Fair chronic   CLINICAL DECISION MAKING: Evolving/moderate complexity   EVALUATION COMPLEXITY: Low     GOALS:     SHORT TERM GOALS: Target date: 12/11/2022   Clemons will be >75% HEP compliant to improve carryover between sessions and facilitate independent management of condition   Evaluation (11/13/2022): ongoing Goal status: INITIAL     LONG TERM GOALS: Target date: 01/08/2023   [place] will improve FOTO score to 60 as a proxy for functional improvement   Evaluation/Baseline (11/13/2022): 51 Goal status: INITIAL     2.  Vivaan will self report >/= 50% decrease in pain from evaluation    Evaluation/Baseline (11/13/2022): 10/10 max pain Goal status: INITIAL     3.  Tyshon will be able to walk for 60 min, not limited by pain    Evaluation/Baseline (11/13/2022): 15 min Goal status: INITIAL     4.  Adalbert will be able to play pickle ball, not limited by pain   Evaluation/Baseline (11/13/2022): limited Goal status: INITIAL     5.  Adger will report confidence in self management of condition at time of discharge with advanced HEP   Evaluation/Baseline (11/13/2022): unable to self manage Goal status: INITIAL     PLAN: PT FREQUENCY: 1-2x/week   PT DURATION: 8 weeks (Ending 01/08/2023)   PLANNED INTERVENTIONS: Therapeutic exercises, Aquatic therapy, Therapeutic activity, Neuro Muscular re-education, Gait training, Patient/Family education, Joint mobilization, Dry Needling, Electrical stimulation, Spinal mobilization and/or manipulation, Moist heat, Taping, Vasopneumatic device, Ionotophoresis '4mg'$ /ml Dexamethasone, and Manual therapy   PLAN FOR NEXT SESSION: Neural mobility, PA lower lumbar spine, TDN?, core and hip strengthening   Mathis Dad, PT 11/28/2022, 12:09  PM

## 2022-12-03 ENCOUNTER — Ambulatory Visit: Payer: Medicare Other | Admitting: Physical Therapy

## 2022-12-03 ENCOUNTER — Encounter: Payer: Self-pay | Admitting: Physical Therapy

## 2022-12-03 ENCOUNTER — Encounter: Payer: Medicare Other | Admitting: Physical Therapy

## 2022-12-03 DIAGNOSIS — R739 Hyperglycemia, unspecified: Secondary | ICD-10-CM | POA: Diagnosis not present

## 2022-12-03 DIAGNOSIS — M6281 Muscle weakness (generalized): Secondary | ICD-10-CM | POA: Diagnosis not present

## 2022-12-03 DIAGNOSIS — E785 Hyperlipidemia, unspecified: Secondary | ICD-10-CM | POA: Diagnosis not present

## 2022-12-03 DIAGNOSIS — Z125 Encounter for screening for malignant neoplasm of prostate: Secondary | ICD-10-CM | POA: Diagnosis not present

## 2022-12-03 DIAGNOSIS — M5459 Other low back pain: Secondary | ICD-10-CM | POA: Diagnosis not present

## 2022-12-03 DIAGNOSIS — I10 Essential (primary) hypertension: Secondary | ICD-10-CM | POA: Diagnosis not present

## 2022-12-03 DIAGNOSIS — F419 Anxiety disorder, unspecified: Secondary | ICD-10-CM | POA: Diagnosis not present

## 2022-12-03 DIAGNOSIS — R7989 Other specified abnormal findings of blood chemistry: Secondary | ICD-10-CM | POA: Diagnosis not present

## 2022-12-03 NOTE — Therapy (Signed)
OUTPATIENT PHYSICAL THERAPY TREATMENT NOTE   Patient Name: Troy [name] MRN: [id number] DOB:Aug 10, 1951, 72 y.o., male Today's Date: 12/03/2022  PCP: Velna Hatchet, MD  REFERRING PROVIDER: Kary Kos, MD   END OF SESSION:   PT End of Session - 12/03/22 1215     Visit Number 6    Date for PT Re-Evaluation 01/08/23    Authorization Type MCR - FOTO    Progress Note Due on Visit 10    PT Start Time 1215    PT Stop Time 4098    PT Time Calculation (min) 41 min    Activity Tolerance Patient tolerated treatment well    Behavior During Therapy [employer] for tasks assessed/performed              Past Medical History:  Diagnosis Date   Aortic regurgitation    Mild by echo 07/2022   Arthritis    "knees" (01/02/2017)   Ascending aorta dilatation ([place])    37m by CT 2017 and 442mby CT 2019, 4374my echo 07/2020, 84m86m echo 2022, 41 mm by echo 2023   CAD (coronary artery disease)    a. s/[place] PCI in 2010 in [place] Unstable angina/LHC 8/8/1/1/9147plicated by V fib arrest after contrast injection. Cath 06/19/2015 s/p DES to mid LAD and RPDA. c. 11/2016: chest pain/ abnormal nuclear stress test prompting cath 11/13/16 showing 80% ostial diag, 60% mid RCA, no acute lesions, medical therapy recommended.   Dyslipidemia    Essential hypertension    Lung nodule < 6cm on CT 06/16/2015   Right lung   Mitral regurgitation    mild to moderate by echo 07/2020   Mitral regurgitation    Mild by echo 07/2022   OSA on CPAP    Persistent atrial fibrillation (HCC)[place] a. 06/2015 noted to be in new a-fib when arrived with unstable angina. Converted to NSR after being shocked in the cath lab for vfib;  b. 06/2015 Eliquis initiated as PAF noted on event monitor. c. Recurrent atrial fib?flutter 10/2016, issues with recurrent AF requiring multiple DCCVs in 02/2017. Failed sotalol, not candidate for Tikosyn due to cost/QTC, Multaq not felt likely strong enough.   Pulmonary nodule 07/2015   a. 1.5 x 1.2 cm  smoothly marginated nodule in the central aspect of the right lower lobe with recommendation correlation with nonemergent PET-CT to exclude a neoplasm , stable by CT 07/2016   Severe left ventricular hypertrophy    Ventricular fibrillation (HCC)[place] a. occured during cath 06/18/2015.   Past Surgical History:  Procedure Laterality Date   CARDIAC CATHETERIZATION N/A 06/18/2015   Procedure: Left Heart Cath and Coronary Angiography;  Surgeon: ChriBurnell Blanks;  [facility];  Service: Cardiovascular;  Laterality: N/A;   CARDIAC CATHETERIZATION N/A 06/19/2015   Procedure: Coronary Stent Intervention;  Surgeon: Peter M JordMartinique;  [facility];  Service: Cardiovascular;  Laterality: N/A;   CARDIAC CATHETERIZATION N/A 11/13/2016   Procedure: Left Heart Cath and Coronary Angiography;  Surgeon: JonaLorretta Harp;  [facility];  Service: Cardiovascular;  Laterality: N/A;   CARDIOVERSION N/A 11/17/2016   Procedure: CARDIOVERSION;  Surgeon: DaltLarey Dresser;  [facility];  Laterality: N/A;   CARDIOVERSION N/A 01/03/2017   Procedure: CARDIOVERSION;  Surgeon: BriaLelon Perla;  [facility];  Laterality: N/A;   CARDIOVERSION N/A 02/05/2017   Procedure: Cardioversion;  Surgeon: Evans Lance, MD;  Location: [place] [facility];  Service: Cardiovascular;  Laterality: N/A;   CARDIOVERSION N/A 02/20/2017   Procedure: Cardioversion;  Surgeon: Evans Lance, MD;  Location: [place] [facility];  Service: Cardiovascular;  Laterality: N/A;   CARTILAGE SURGERY Bilateral    "thumbs"   CATARACT EXTRACTION W/ INTRAOCULAR LENS  IMPLANT, BILATERAL Bilateral    CORONARY ANGIOPLASTY WITH STENT PLACEMENT  ~ 2007   JOINT REPLACEMENT     KNEE ARTHROSCOPY Right    RETINAL DETACHMENT SURGERY Bilateral    "laser thing"   TEE WITHOUT CARDIOVERSION N/A 11/17/2016   Procedure: TRANSESOPHAGEAL ECHOCARDIOGRAM  (TEE);  Surgeon: Larey Dresser, MD;  Location: [place];  Service: Cardiovascular;  Laterality: N/A;   TOTAL KNEE ARTHROPLASTY Left 2000s   TOTAL KNEE ARTHROPLASTY Right 02/08/2018   Procedure: RIGHT TOTAL KNEE ARTHROPLASTY;  Surgeon: Gaynelle Arabian, MD;  [facility];  Service: Orthopedics;  Laterality: Right;  with block   Patient Active Problem List   Diagnosis Date Noted   Aortic regurgitation 07/15/2022   Acute cystitis 11/17/2020   AKI (acute kidney injury) ([place]) 11/16/2020   Thrombocytopenia ([place]) 11/16/2020   Sepsis ([place]) 11/15/2020   Ascending aorta dilatation (HCC)    OA (osteoarthritis) of knee 02/08/2018   Environmental and seasonal allergies 07/06/2015   Pulmonary nodule 06/27/2015   Mitral regurgitation    Ventricular fibrillation ([place])    Hyperlipidemia 06/18/2015   CAD (coronary artery disease), native coronary artery 06/17/2015   Persistent atrial fibrillation with RVR ([place]), successful TEE DCCV 11/17/16 06/16/2015   Essential (primary) hypertension 04/11/2015   Abnormal presence of protein in urine 04/11/2015    REFERRING DIAG: Other intervertebral disc displacement, lumbar region [M51.26]    THERAPY DIAG:  Other low back pain  Muscle weakness  Rationale for Evaluation and Treatment Rehabilitation  PERTINENT HISTORY: Persistent afib   PRECAUTIONS: Chronic L2 pars defects with grade 1 anterolisthesis   SUBJECTIVE:                                                                                                                                                                                      SUBJECTIVE STATEMENT:    Pt reports that his pain level has been higher recently.  He is not sure why.  He is interested in TDN.   PAIN:  Are you having pain? Yes Pain location: low back, L LE (mainly lateral lower leg) NPRS scale:  8/10 Aggravating factors: standing and walking 15 min Relieving factors: sitting or lying down Pain description:  intermittent, constant, burning, and aching Stage: Chronic Stability: staying the same 24 hour pattern: activity related  OBJECTIVE: (objective measures completed at initial evaluation unless otherwise dated)   DIAGNOSTIC FINDINGS:    MRI:   IMPRESSION: 1. L4-5 disc extrusion with left lateral recess and left neural foraminal stenosis. 2. Chronic L2 pars defects with grade 1 anterolisthesis, severe disc degeneration, and mild-to-moderate neural foraminal stenosis.     SENSATION:          Light touch: Appears intact   MUSCLE LENGTH: Loss of lumbar lordosis Hamstrings: Right significant restriction; Left significant restriction ASLR: Right ASLR = PSLR; Left ASLR = PSLR Thomas test: Right subtle restriction; Left subtle restriction Ely's test: Right significant restriction; Left significant restriction     LUMBAR AROM   AROM AROM  11/13/2022  Flexion Fingertips to knees (limited by ~50%), w/ concordant pain  Extension limited by 50%, w/ concordant pain  Right lateral flexion limited by 50%, w/ concordant pain  Left lateral flexion limited by 50%, w/ concordant pain  Right rotation limited by 50%  Left rotation limited by 50%    (Blank rows = not tested)     LUMBAR SPECIAL TESTS:  Straight leg raise: L (-), R (-) Slump: L (equivocal), R (equivocal)   LE MMT:   MMT Right 11/13/2022 Left 11/13/2022  Hip flexion (L2, L3) 4 4  Knee extension (L3) 4 4  Knee flexion 4 4  Hip abduction 4+ 4+  Hip extension      Hip external rotation      Hip internal rotation      Hip adduction      Ankle dorsiflexion (L4)      Ankle plantarflexion (S1)      Ankle inversion      Ankle eversion      Great Toe ext (L5)      Grossly        (Blank rows = not tested, score listed is out of 5 possible points.  N = WNL, D = diminished, C = clear for gross weakness with myotome testing, * = concordant pain with testing)    PALPATION:            TTP lower lumbar spine.  PA lower lumbar  spine - concordant pain   PATIENT SURVEYS:  FOTO 51 -> 60     TODAY'S TREATMENT  Creating, reviewing, and completing below HEP   PATIENT EDUCATION:  POC, diagnosis, prognosis, HEP, and outcome measures.  Pt educated via explanation, demonstration, and handout (HEP).  Pt confirms understanding verbally.    HOME EXERCISE PROGRAM: Access Code: [email or web address] URL: [email or web address] Date: 11/21/2022 Prepared by: Shearon Balo  Exercises - Seated Sciatic Tensioner  - 2 x daily - 7 x weekly - 20 reps - Hooklying Isometric Clamshell  - 1 x daily - 7 x weekly - 3 sets - 10 reps - Supine Hip Adduction Isometric with Ball  - 1 x daily - 7 x weekly - 2 sets - 10 reps - 10'' hold - Bridge  - 1 x daily - 7 x weekly - 2 sets - 10 reps - 5-10'' hold Added 11/26/22 - Supine Sciatic Nerve Glide  - 2 x daily - 7 x weekly - 20 reps - Supine Active Straight Leg Raise  - 1 x daily - 7 x weekly - 3 sets - 10 reps   ASTERISK SIGNS     Asterisk Signs Eval (11/13/2022)            Pain 6-10/10            Lumbar flexion  To knees w/ P!            walking 15 min            HS length Sig limitation bil                             TREATMENT: Therapeutic Exercise: - Nustep level 5 x 8 mins  Manual Therapy  PA and UPA L4-L5 G III - G IV, to start and end session Skilled palpation to identify trigger points for TDN STM to all listed muscles following TDN  Trigger Point Dry-Needling  Treatment instructions: Expect mild to moderate muscle soreness. S/S of pneumothorax if dry needled over a lung field, and to seek immediate medical attention should they occur. Patient verbalized understanding of these instructions and education.  Patient Consent Given: Yes Education handout provided: No Muscles treated: bil lumbar paraspinals Electrical stimulation performed: Yes Parameters: 10 min low frequency - milli amps - low intensity; 10 min - micro amps - high frequency high intensity. Treatment  response/outcome: pain reduction     ASSESSMENT:   CLINICAL IMPRESSION: Gwyndolyn Saxon tolerated session well with no adverse reaction.  Trialed TDN + estim today.  Pt reports significant pain relief following .  Will monitor for durability.   OBJECTIVE IMPAIRMENTS: Pain, lumbar ROM, hip and core strength   ACTIVITY LIMITATIONS: walking, standing, bending, lifting   PERSONAL FACTORS: See medical history and pertinent history     REHAB POTENTIAL: Fair chronic   CLINICAL DECISION MAKING: Evolving/moderate complexity   EVALUATION COMPLEXITY: Low     GOALS:     SHORT TERM GOALS: Target date: 12/11/2022   [place] will be >75% HEP compliant to improve carryover between sessions and facilitate independent management of condition   Evaluation (11/13/2022): ongoing Goal status: INITIAL     LONG TERM GOALS: Target date: 01/08/2023   [place] will improve FOTO score to 60 as a proxy for functional improvement   Evaluation/Baseline (11/13/2022): 51 Goal status: INITIAL     2.  Nghia will self report >/= 50% decrease in pain from evaluation    Evaluation/Baseline (11/13/2022): 10/10 max pain Goal status: INITIAL     3.  Nimrod will be able to walk for 60 min, not limited by pain    Evaluation/Baseline (11/13/2022): 15 min Goal status: INITIAL     4.  Shontez will be able to play pickle ball, not limited by pain   Evaluation/Baseline (11/13/2022): limited Goal status: INITIAL     5.  Mcguire will report confidence in self management of condition at time of discharge with advanced HEP   Evaluation/Baseline (11/13/2022): unable to self manage Goal status: INITIAL     PLAN: PT FREQUENCY: 1-2x/week   PT DURATION: 8 weeks (Ending 01/08/2023)   PLANNED INTERVENTIONS: Therapeutic exercises, Aquatic therapy, Therapeutic activity, Neuro Muscular re-education, Gait training, Patient/Family education, Joint mobilization, Dry Needling, Electrical stimulation, Spinal mobilization and/or  manipulation, Moist heat, Taping, Vasopneumatic device, Ionotophoresis '4mg'$ /ml Dexamethasone, and Manual therapy   PLAN FOR NEXT SESSION: Neural mobility, PA lower lumbar spine, TDN?, core and hip strengthening   Mathis Dad, PT 12/03/2022, 12:57 PM

## 2022-12-05 ENCOUNTER — Ambulatory Visit: Payer: Medicare Other | Admitting: Physical Therapy

## 2022-12-05 ENCOUNTER — Encounter: Payer: Self-pay | Admitting: Physical Therapy

## 2022-12-05 DIAGNOSIS — M6281 Muscle weakness (generalized): Secondary | ICD-10-CM | POA: Diagnosis not present

## 2022-12-05 DIAGNOSIS — M5459 Other low back pain: Secondary | ICD-10-CM | POA: Diagnosis not present

## 2022-12-05 NOTE — Therapy (Signed)
OUTPATIENT PHYSICAL THERAPY TREATMENT NOTE   Patient Name: Troy [name] MRN: [id number] DOB:1951/10/19, 72 y.o., male Today's Date: 12/05/2022  PCP: Velna Hatchet, MD  REFERRING PROVIDER: Kary Kos, MD   END OF SESSION:   PT End of Session - 12/05/22 0915     Visit Number 7    Date for PT Re-Evaluation 01/08/23    Authorization Type MCR - FOTO    Progress Note Due on Visit 10    PT Start Time 0915    PT Stop Time 0956    PT Time Calculation (min) 41 min    Activity Tolerance Patient tolerated treatment well    Behavior During Therapy [facility] for tasks assessed/performed              Past Medical History:  Diagnosis Date   Aortic regurgitation    Mild by echo 07/2022   Arthritis    "knees" (01/02/2017)   Ascending aorta dilatation ([place])    25m by CT 2017 and 453mby CT 2019, 4351my echo 07/2020, 62m21m echo 2022, 41 mm by echo 2023   CAD (coronary artery disease)    [place] Unstable angina/LHC 8/8/9/7/6734plicated by V fib arrest after contrast injection. Cath 06/19/2015 s/p DES to mid LAD and RPDA. c. 11/2016: chest pain/ abnormal nuclear stress test prompting cath 11/13/16 showing 80% ostial diag, 60% mid RCA, no acute lesions, medical therapy recommended.   Dyslipidemia    Essential hypertension    Lung nodule < 6cm on CT 06/16/2015   Right lung   Mitral regurgitation    mild to moderate by echo 07/2020   Mitral regurgitation    Mild by echo 07/2022   OSA on CPAP    Persistent atrial fibrillation (HCC)[place] a. 06/2015 noted to be in new a-fib when arrived with unstable angina. Converted to NSR after being shocked in the cath lab for vfib;  b. 06/2015 Eliquis initiated as PAF noted on event monitor. c. Recurrent atrial fib?flutter 10/2016, issues with recurrent AF requiring multiple DCCVs in 02/2017. Failed sotalol, not candidate for Tikosyn due to cost/QTC, Multaq not felt likely strong enough.   Pulmonary nodule 07/2015   a. 1.5 x 1.2 cm  smoothly marginated nodule in the central aspect of the right lower lobe with recommendation correlation with nonemergent PET-CT to exclude a neoplasm , stable by CT 07/2016   Severe left ventricular hypertrophy    Ventricular fibrillation (HCC)[place] a. occured during cath 06/18/2015.   Past Surgical History:  Procedure Laterality Date   CARDIAC CATHETERIZATION N/A 06/18/2015   Procedure: Left Heart Cath and Coronary Angiography;  Surgeon: ChriBurnell Blanks;  [facility];  Service: Cardiovascular;  Laterality: N/A;   CARDIAC CATHETERIZATION N/A 06/19/2015   Procedure: Coronary Stent Intervention;  Surgeon: Peter M JordMartinique;  [facility];  Service: Cardiovascular;  Laterality: N/A;   CARDIAC CATHETERIZATION N/A 11/13/2016   Procedure: Left Heart Cath and Coronary Angiography;  Surgeon: JonaLorretta Harp;  [facility] [place];  Service: Cardiovascular;  Laterality: N/A;   CARDIOVERSION N/A 11/17/2016   Procedure: CARDIOVERSION;  Surgeon: DaltLarey Dresser;  [facility] [place];  Laterality: N/A;   CARDIOVERSION N/A 01/03/2017   Procedure: CARDIOVERSION;  Surgeon: BriaLelon Perla;  [facility] [place] [place];  Laterality: N/A;   CARDIOVERSION N/A 02/05/2017   Procedure: Cardioversion;  Surgeon: Evans Lance, MD;  Location: [place] [facility];  Service: Cardiovascular;  Laterality: N/A;   CARDIOVERSION N/A 02/20/2017   Procedure: Cardioversion;  Surgeon: Evans Lance, MD;  Location: [place] [facility];  Service: Cardiovascular;  Laterality: N/A;   CARTILAGE SURGERY Bilateral    "thumbs"   CATARACT EXTRACTION W/ INTRAOCULAR LENS  IMPLANT, BILATERAL Bilateral    CORONARY ANGIOPLASTY WITH STENT PLACEMENT  ~ 2007   JOINT REPLACEMENT     KNEE ARTHROSCOPY Right    RETINAL DETACHMENT SURGERY Bilateral    "laser thing"   TEE WITHOUT CARDIOVERSION N/A 11/17/2016   Procedure: TRANSESOPHAGEAL ECHOCARDIOGRAM  (TEE);  Surgeon: Larey Dresser, MD;  Location: [place];  Service: Cardiovascular;  Laterality: N/A;   TOTAL KNEE ARTHROPLASTY Left 2000s   TOTAL KNEE ARTHROPLASTY Right 02/08/2018   Procedure: RIGHT TOTAL KNEE ARTHROPLASTY;  Surgeon: Gaynelle Arabian, MD;  [facility];  Service: Orthopedics;  Laterality: Right;  with block   Patient Active Problem List   Diagnosis Date Noted   Aortic regurgitation 07/15/2022   Acute cystitis 11/17/2020   AKI (acute kidney injury) ([place]) 11/16/2020   Thrombocytopenia ([place]) 11/16/2020   Sepsis ([place]) 11/15/2020   Ascending aorta dilatation (HCC)    OA (osteoarthritis) of knee 02/08/2018   Environmental and seasonal allergies 07/06/2015   Pulmonary nodule 06/27/2015   Mitral regurgitation    Ventricular fibrillation ([place])    Hyperlipidemia 06/18/2015   CAD (coronary artery disease), native coronary artery 06/17/2015   Persistent atrial fibrillation with RVR ([place]), successful TEE DCCV 11/17/16 06/16/2015   Essential (primary) hypertension 04/11/2015   Abnormal presence of protein in urine 04/11/2015    REFERRING DIAG: Other intervertebral disc displacement, lumbar region [M51.26]    THERAPY DIAG:  Other low back pain  Muscle weakness  Rationale for Evaluation and Treatment Rehabilitation  PERTINENT HISTORY: Persistent afib   PRECAUTIONS: Chronic L2 pars defects with grade 1 anterolisthesis   SUBJECTIVE:                                                                                                                                                                                      SUBJECTIVE STATEMENT:    Pt reports that TDN was minimally helpful.  He has been prescribed Tramadol which he feels is somewhat helpful.    PAIN:  Are you having pain? Yes Pain location: low back, L LE (mainly lateral lower leg) NPRS scale:  8/10 Aggravating factors: standing and walking 15 min Relieving factors: sitting or lying down Pain description:  intermittent, constant, burning, and aching Stage: Chronic Stability: staying the same 24 hour pattern: activity related  OBJECTIVE: (objective measures completed at initial evaluation unless otherwise dated)   DIAGNOSTIC FINDINGS:    MRI:   IMPRESSION: 1. L4-5 disc extrusion with left lateral recess and left neural foraminal stenosis. 2. Chronic L2 pars defects with grade 1 anterolisthesis, severe disc degeneration, and mild-to-moderate neural foraminal stenosis.     SENSATION:          Light touch: Appears intact   MUSCLE LENGTH: Loss of lumbar lordosis Hamstrings: Right significant restriction; Left significant restriction ASLR: Right ASLR = PSLR; Left ASLR = PSLR Thomas test: Right subtle restriction; Left subtle restriction Ely's test: Right significant restriction; Left significant restriction     LUMBAR AROM   AROM AROM  11/13/2022  Flexion Fingertips to knees (limited by ~50%), w/ concordant pain  Extension limited by 50%, w/ concordant pain  Right lateral flexion limited by 50%, w/ concordant pain  Left lateral flexion limited by 50%, w/ concordant pain  Right rotation limited by 50%  Left rotation limited by 50%    (Blank rows = not tested)     LUMBAR SPECIAL TESTS:  Straight leg raise: L (-), R (-) Slump: L (equivocal), R (equivocal)   LE MMT:   MMT Right 11/13/2022 Left 11/13/2022  Hip flexion (L2, L3) 4 4  Knee extension (L3) 4 4  Knee flexion 4 4  Hip abduction 4+ 4+  Hip extension      Hip external rotation      Hip internal rotation      Hip adduction      Ankle dorsiflexion (L4)      Ankle plantarflexion (S1)      Ankle inversion      Ankle eversion      Great Toe ext (L5)      Grossly        (Blank rows = not tested, score listed is out of 5 possible points.  N = WNL, D = diminished, C = clear for gross weakness with myotome testing, * = concordant pain with testing)    PALPATION:            TTP lower lumbar spine.  PA lower lumbar  spine - concordant pain   PATIENT SURVEYS:  FOTO 51 -> 60     TODAY'S TREATMENT  Creating, reviewing, and completing below HEP   PATIENT EDUCATION:  POC, diagnosis, prognosis, HEP, and outcome measures.  Pt educated via explanation, demonstration, and handout (HEP).  Pt confirms understanding verbally.    HOME EXERCISE PROGRAM: Access Code: [email or web address] URL: [email or web address] Date: 11/21/2022 Prepared by: Shearon Balo  Exercises - Seated Sciatic Tensioner  - 2 x daily - 7 x weekly - 20 reps - Hooklying Isometric Clamshell  - 1 x daily - 7 x weekly - 3 sets - 10 reps - Supine Hip Adduction Isometric with Ball  - 1 x daily - 7 x weekly - 2 sets - 10 reps - 10'' hold - Bridge  - 1 x daily - 7 x weekly - 2 sets - 10 reps - 5-10'' hold Added 11/26/22 - Supine Sciatic Nerve Glide  - 2 x daily - 7 x weekly - 20 reps - Supine Active Straight Leg Raise  - 1 x daily - 7 x weekly - 3 sets - 10 reps   ASTERISK SIGNS     Asterisk Signs Eval (11/13/2022)            Pain 6-10/10            Lumbar flexion  To knees w/ P!            walking 15 min            HS length Sig limitation bil                             TREATMENT: Therapeutic Exercise: - Nustep level 8 x 8 mins - LTR - 20x - pilates ring squeeze - 5'' 2x10 - Alternating clam - Blue TB - hip adduction ring squeeze - 2x10 - bridge - 3x10 - leg press - 45# - 3x10   Manual Therapy  PA and UPA L4-L5 G III - G IV, to start and end session    ASSESSMENT:   CLINICAL IMPRESSION: Jveon tolerated session well with no adverse reaction.  TDN showed no lasting benefit so this was d/c'd.  Returned to basic core strengthening exercises.  Added in leg pess to address hip extensor weakness.  Significant decrease in pain following manual therapy and exercise.      OBJECTIVE IMPAIRMENTS: Pain, lumbar ROM, hip and core strength   ACTIVITY LIMITATIONS: walking, standing, bending, lifting   PERSONAL FACTORS: See  medical history and pertinent history     REHAB POTENTIAL: Fair chronic   CLINICAL DECISION MAKING: Evolving/moderate complexity   EVALUATION COMPLEXITY: Low     GOALS:     SHORT TERM GOALS: Target date: 12/11/2022   Oryon will be >75% HEP compliant to improve carryover between sessions and facilitate independent management of condition   Evaluation (11/13/2022): ongoing Goal status: met 1/26     LONG TERM GOALS: Target date: 01/08/2023   Donna will improve FOTO score to 60 as a proxy for functional improvement   Evaluation/Baseline (11/13/2022): 51 Goal status: INITIAL     2.  Dmauri will self report >/= 50% decrease in pain from evaluation    Evaluation/Baseline (11/13/2022): 10/10 max pain 1/26: 9/10 Goal status: ongoing     3.  Aadvik will be able to walk for 60 min, not limited by pain    Evaluation/Baseline (11/13/2022): 15 min Goal status: INITIAL     4.  Kalani will be able to play pickle ball, not limited by pain   Evaluation/Baseline (11/13/2022): limited 1/26: unable Goal status: ongoing     5.  Kengo will report confidence in self management of condition at time of discharge with advanced HEP   Evaluation/Baseline (11/13/2022): unable to self manage Goal status: INITIAL     PLAN: PT FREQUENCY: 1-2x/week   PT DURATION: 8 weeks (Ending 01/08/2023)   PLANNED INTERVENTIONS: Therapeutic exercises, Aquatic therapy, Therapeutic activity, Neuro Muscular re-education, Gait training, Patient/Family education, Joint mobilization, Dry Needling, Electrical stimulation, Spinal mobilization and/or manipulation, Moist heat, Taping, Vasopneumatic device, Ionotophoresis '4mg'$ /ml Dexamethasone, and Manual therapy   PLAN FOR NEXT SESSION: Neural mobility, PA lower lumbar spine, TDN?, core and hip strengthening   Mathis Dad, PT 12/05/2022, 10:21 AM

## 2022-12-10 ENCOUNTER — Ambulatory Visit: Payer: Medicare Other | Admitting: Physical Therapy

## 2022-12-10 ENCOUNTER — Encounter: Payer: Self-pay | Admitting: Physical Therapy

## 2022-12-10 ENCOUNTER — Encounter: Payer: Medicare Other | Admitting: Physical Therapy

## 2022-12-10 DIAGNOSIS — M5459 Other low back pain: Secondary | ICD-10-CM | POA: Diagnosis not present

## 2022-12-10 DIAGNOSIS — I712 Thoracic aortic aneurysm, without rupture, unspecified: Secondary | ICD-10-CM | POA: Diagnosis not present

## 2022-12-10 DIAGNOSIS — G720 Drug-induced myopathy: Secondary | ICD-10-CM | POA: Diagnosis not present

## 2022-12-10 DIAGNOSIS — Z7901 Long term (current) use of anticoagulants: Secondary | ICD-10-CM | POA: Diagnosis not present

## 2022-12-10 DIAGNOSIS — M6281 Muscle weakness (generalized): Secondary | ICD-10-CM | POA: Diagnosis not present

## 2022-12-10 DIAGNOSIS — E785 Hyperlipidemia, unspecified: Secondary | ICD-10-CM | POA: Diagnosis not present

## 2022-12-10 DIAGNOSIS — I5020 Unspecified systolic (congestive) heart failure: Secondary | ICD-10-CM | POA: Diagnosis not present

## 2022-12-10 DIAGNOSIS — I4891 Unspecified atrial fibrillation: Secondary | ICD-10-CM | POA: Diagnosis not present

## 2022-12-10 DIAGNOSIS — Z0000 Encounter for general adult medical examination without abnormal findings: Secondary | ICD-10-CM | POA: Diagnosis not present

## 2022-12-10 DIAGNOSIS — Z1339 Encounter for screening examination for other mental health and behavioral disorders: Secondary | ICD-10-CM | POA: Diagnosis not present

## 2022-12-10 DIAGNOSIS — I25118 Atherosclerotic heart disease of native coronary artery with other forms of angina pectoris: Secondary | ICD-10-CM | POA: Diagnosis not present

## 2022-12-10 DIAGNOSIS — I10 Essential (primary) hypertension: Secondary | ICD-10-CM | POA: Diagnosis not present

## 2022-12-10 DIAGNOSIS — Z1331 Encounter for screening for depression: Secondary | ICD-10-CM | POA: Diagnosis not present

## 2022-12-10 DIAGNOSIS — I700 Atherosclerosis of aorta: Secondary | ICD-10-CM | POA: Diagnosis not present

## 2022-12-10 NOTE — Therapy (Signed)
OUTPATIENT PHYSICAL THERAPY TREATMENT NOTE   Patient Name: Troy [name] MRN: [id number] DOB:1951/11/08, 72 y.o., male Today's Date: 12/10/2022  PCP: Velna Hatchet, MD  REFERRING PROVIDER: Kary Kos, MD   END OF SESSION:   PT End of Session - 12/10/22 1204     Visit Number 8    Date for PT Re-Evaluation 01/08/23    Authorization Type MCR - FOTO    Progress Note Due on Visit 10    PT Start Time 1205    PT Stop Time 1245    PT Time Calculation (min) 40 min    Activity Tolerance Patient tolerated treatment well    Behavior During Therapy [facility] for tasks assessed/performed              Past Medical History:  Diagnosis Date   Aortic regurgitation    Mild by echo 07/2022   Arthritis    "knees" (01/02/2017)   Ascending aorta dilatation ([place])    43m by CT 2017 and 472mby CT 2019, 4362my echo 07/2020, 29m23m echo 2022, 41 mm by echo 2023   CAD (coronary artery disease)    a. [place] in 2010 [place] Unstable angina/LHC 8/8/4/1/3244plicated by V fib arrest after contrast injection. Cath 06/19/2015 s/p DES to mid LAD and RPDA. c. 11/2016: chest pain/ abnormal nuclear stress test prompting cath 11/13/16 showing 80% ostial diag, 60% mid RCA, no acute lesions, medical therapy recommended.   Dyslipidemia    Essential hypertension    Lung nodule < 6cm on CT 06/16/2015   Right lung   Mitral regurgitation    mild to moderate by echo 07/2020   Mitral regurgitation    Mild by echo 07/2022   OSA on CPAP    Persistent atrial fibrillation (HCC)[place] a. 06/2015 noted to be in new a-fib when arrived with unstable angina. Converted to NSR after being shocked in the cath lab for vfib;  b. 06/2015 Eliquis initiated as PAF noted on event monitor. c. Recurrent atrial fib?flutter 10/2016, issues with recurrent AF requiring multiple DCCVs in 02/2017. Failed sotalol, not candidate for Tikosyn due to cost/QTC, Multaq not felt likely strong enough.   Pulmonary nodule 07/2015   a. 1.5 x 1.2 cm  smoothly marginated nodule in the central aspect of the right lower lobe with recommendation correlation with nonemergent PET-CT to exclude a neoplasm , stable by CT 07/2016   Severe left ventricular hypertrophy    Ventricular fibrillation (HCC)[place] a. occured during cath 06/18/2015.   Past Surgical History:  Procedure Laterality Date   CARDIAC CATHETERIZATION N/A 06/18/2015   Procedure: Left Heart Cath and Coronary Angiography;  Surgeon: ChriBurnell Blanks;  [facility];  Service: Cardiovascular;  Laterality: N/A;   CARDIAC CATHETERIZATION N/A 06/19/2015   Procedure: Coronary Stent Intervention;  Surgeon: Peter M JordMartinique;  [facility] [place];  Service: Cardiovascular;  Laterality: N/A;   CARDIAC CATHETERIZATION N/A 11/13/2016   Procedure: Left Heart Cath and Coronary Angiography;  Surgeon: JonaLorretta Harp;  [facility];  Service: Cardiovascular;  Laterality: N/A;   CARDIOVERSION N/A 11/17/2016   Procedure: CARDIOVERSION;  Surgeon: DaltLarey Dresser;  [facility];  Laterality: N/A;   CARDIOVERSION N/A 01/03/2017   Procedure: CARDIOVERSION;  Surgeon: BriaLelon Perla;  [facility] [place];  Laterality: N/A;   CARDIOVERSION N/A 02/05/2017   Procedure: Cardioversion;  Surgeon: Evans Lance, MD;  Location: [place] [facility];  Service: Cardiovascular;  Laterality: N/A;   CARDIOVERSION N/A 02/20/2017   Procedure: Cardioversion;  Surgeon: Evans Lance, MD;  Location: [place] [facility];  Service: Cardiovascular;  Laterality: N/A;   CARTILAGE SURGERY Bilateral    "thumbs"   CATARACT EXTRACTION W/ INTRAOCULAR LENS  IMPLANT, BILATERAL Bilateral    CORONARY ANGIOPLASTY WITH STENT PLACEMENT  ~ 2007   JOINT REPLACEMENT     KNEE ARTHROSCOPY Right    RETINAL DETACHMENT SURGERY Bilateral    "laser thing"   TEE WITHOUT CARDIOVERSION N/A 11/17/2016   Procedure: TRANSESOPHAGEAL ECHOCARDIOGRAM  (TEE);  Surgeon: Larey Dresser, MD;  Location: [place];  Service: Cardiovascular;  Laterality: N/A;   TOTAL KNEE ARTHROPLASTY Left 2000s   TOTAL KNEE ARTHROPLASTY Right 02/08/2018   Procedure: RIGHT TOTAL KNEE ARTHROPLASTY;  Surgeon: Gaynelle Arabian, MD;  [facility];  Service: Orthopedics;  Laterality: Right;  with block   Patient Active Problem List   Diagnosis Date Noted   Aortic regurgitation 07/15/2022   Acute cystitis 11/17/2020   AKI (acute kidney injury) ([place]) 11/16/2020   Thrombocytopenia ([place]) 11/16/2020   Sepsis ([place]) 11/15/2020   Ascending aorta dilatation (HCC)    OA (osteoarthritis) of knee 02/08/2018   Environmental and seasonal allergies 07/06/2015   Pulmonary nodule 06/27/2015   Mitral regurgitation    Ventricular fibrillation ([place])    Hyperlipidemia 06/18/2015   CAD (coronary artery disease), native coronary artery 06/17/2015   Persistent atrial fibrillation with RVR ([place]), successful TEE DCCV 11/17/16 06/16/2015   Essential (primary) hypertension 04/11/2015   Abnormal presence of protein in urine 04/11/2015    REFERRING DIAG: Other intervertebral disc displacement, lumbar region [M51.26]    THERAPY DIAG:  Other low back pain  Muscle weakness  Rationale for Evaluation and Treatment Rehabilitation  PERTINENT HISTORY: Persistent afib   PRECAUTIONS: Chronic L2 pars defects with grade 1 anterolisthesis   SUBJECTIVE:                                                                                                                                                                                      SUBJECTIVE STATEMENT:    Pt reports that manual therapy helps his pain, but nothing else "touches it".   PAIN:  Are you having pain? Yes Pain location: low back, L LE (mainly lateral lower leg) NPRS scale:  8/10 Aggravating factors: standing and walking 15 min Relieving factors: sitting or lying down Pain description: intermittent, constant, burning, and  aching Stage: Chronic Stability: staying the same 24 hour pattern: activity related    OBJECTIVE: (objective measures completed at initial evaluation  unless otherwise dated)   DIAGNOSTIC FINDINGS:    MRI:   IMPRESSION: 1. L4-5 disc extrusion with left lateral recess and left neural foraminal stenosis. 2. Chronic L2 pars defects with grade 1 anterolisthesis, severe disc degeneration, and mild-to-moderate neural foraminal stenosis.     SENSATION:          Light touch: Appears intact   MUSCLE LENGTH: Loss of lumbar lordosis Hamstrings: Right significant restriction; Left significant restriction ASLR: Right ASLR = PSLR; Left ASLR = PSLR Thomas test: Right subtle restriction; Left subtle restriction Ely's test: Right significant restriction; Left significant restriction     LUMBAR AROM   AROM AROM  11/13/2022  Flexion Fingertips to knees (limited by ~50%), w/ concordant pain  Extension limited by 50%, w/ concordant pain  Right lateral flexion limited by 50%, w/ concordant pain  Left lateral flexion limited by 50%, w/ concordant pain  Right rotation limited by 50%  Left rotation limited by 50%    (Blank rows = not tested)     LUMBAR SPECIAL TESTS:  Straight leg raise: L (-), R (-) Slump: L (equivocal), R (equivocal)   LE MMT:   MMT Right 11/13/2022 Left 11/13/2022  Hip flexion (L2, L3) 4 4  Knee extension (L3) 4 4  Knee flexion 4 4  Hip abduction 4+ 4+  Hip extension      Hip external rotation      Hip internal rotation      Hip adduction      Ankle dorsiflexion (L4)      Ankle plantarflexion (S1)      Ankle inversion      Ankle eversion      Great Toe ext (L5)      Grossly        (Blank rows = not tested, score listed is out of 5 possible points.  N = WNL, D = diminished, C = clear for gross weakness with myotome testing, * = concordant pain with testing)    PALPATION:            TTP lower lumbar spine.  PA lower lumbar spine - concordant pain   PATIENT  SURVEYS:  FOTO 51 -> 60     TODAY'S TREATMENT  Creating, reviewing, and completing below HEP   PATIENT EDUCATION:  POC, diagnosis, prognosis, HEP, and outcome measures.  Pt educated via explanation, demonstration, and handout (HEP).  Pt confirms understanding verbally.    HOME EXERCISE PROGRAM: Access Code: [email or web address] URL: [email or web address] Date: 11/21/2022 Prepared by: Shearon Balo  Exercises - Seated Sciatic Tensioner  - 2 x daily - 7 x weekly - 20 reps - Hooklying Isometric Clamshell  - 1 x daily - 7 x weekly - 3 sets - 10 reps - Supine Hip Adduction Isometric with Ball  - 1 x daily - 7 x weekly - 2 sets - 10 reps - 10'' hold - Bridge  - 1 x daily - 7 x weekly - 2 sets - 10 reps - 5-10'' hold Added 11/26/22 - Supine Sciatic Nerve Glide  - 2 x daily - 7 x weekly - 20 reps - Supine Active Straight Leg Raise  - 1 x daily - 7 x weekly - 3 sets - 10 reps   ASTERISK SIGNS     Asterisk Signs Eval (11/13/2022)            Pain 6-10/10            Lumbar flexion To knees w/ P!  walking 15 min            HS length Sig limitation bil                             TREATMENT: Therapeutic Exercise: - Nustep level 8 x 8 mins - LTR - 20x - Alternating clam - black TB 3x10 - hip adduction ring squeeze - 2x10 - 5'' - bridge - x10, x10 5''  Manual Therapy  PA and UPA L4-L5 G III - G IV, to start and end session Gentle lumbar traction with feet on P-ball    ASSESSMENT:   CLINICAL IMPRESSION: [place] tolerated session well with no adverse reaction.  Added in gentle lumbar traction with pball today to good effect.  Pt reports significant pain reduction following man therapy and exercise.      OBJECTIVE IMPAIRMENTS: Pain, lumbar ROM, hip and core strength   ACTIVITY LIMITATIONS: walking, standing, bending, lifting   PERSONAL FACTORS: See medical history and pertinent history     REHAB POTENTIAL: Fair chronic   CLINICAL DECISION MAKING:  Evolving/moderate complexity   EVALUATION COMPLEXITY: Low     GOALS:     SHORT TERM GOALS: Target date: 12/11/2022   Garik will be >75% HEP compliant to improve carryover between sessions and facilitate independent management of condition   Evaluation (11/13/2022): ongoing Goal status: met 1/26     LONG TERM GOALS: Target date: 01/08/2023   Keaten will improve FOTO score to 60 as a proxy for functional improvement   Evaluation/Baseline (11/13/2022): 51 Goal status: INITIAL     2.  Dalyn will self report >/= 50% decrease in pain from evaluation    Evaluation/Baseline (11/13/2022): 10/10 max pain 1/26: 9/10 Goal status: ongoing     3.  Seth will be able to walk for 60 min, not limited by pain    Evaluation/Baseline (11/13/2022): 15 min Goal status: INITIAL     4.  Aidin will be able to play pickle ball, not limited by pain   Evaluation/Baseline (11/13/2022): limited 1/26: unable Goal status: ongoing     5.  Reginold will report confidence in self management of condition at time of discharge with advanced HEP   Evaluation/Baseline (11/13/2022): unable to self manage Goal status: INITIAL     PLAN: PT FREQUENCY: 1-2x/week   PT DURATION: 8 weeks (Ending 01/08/2023)   PLANNED INTERVENTIONS: Therapeutic exercises, Aquatic therapy, Therapeutic activity, Neuro Muscular re-education, Gait training, Patient/Family education, Joint mobilization, Dry Needling, Electrical stimulation, Spinal mobilization and/or manipulation, Moist heat, Taping, Vasopneumatic device, Ionotophoresis '4mg'$ /ml Dexamethasone, and Manual therapy   PLAN FOR NEXT SESSION: Neural mobility, PA lower lumbar spine, TDN?, core and hip strengthening   Mathis Dad, PT 12/10/2022, 12:53 PM

## 2022-12-12 ENCOUNTER — Ambulatory Visit: Payer: Medicare Other | Attending: Internal Medicine | Admitting: Physical Therapy

## 2022-12-12 ENCOUNTER — Encounter: Payer: Self-pay | Admitting: Physical Therapy

## 2022-12-12 DIAGNOSIS — M6281 Muscle weakness (generalized): Secondary | ICD-10-CM | POA: Diagnosis not present

## 2022-12-12 DIAGNOSIS — M5459 Other low back pain: Secondary | ICD-10-CM | POA: Diagnosis not present

## 2022-12-12 NOTE — Therapy (Signed)
OUTPATIENT PHYSICAL THERAPY TREATMENT NOTE   Patient Name: Troy [name] MRN: [id number] DOB:1951/07/09, 72 y.o., male Today's Date: 12/12/2022  PCP: Velna Hatchet, MD  REFERRING PROVIDER: Kary Kos, MD   END OF SESSION:   PT End of Session - 12/12/22 0914     Visit Number 9    Date for PT Re-Evaluation 01/08/23    Authorization Type MCR - FOTO    Progress Note Due on Visit 10    PT Start Time 0915    PT Stop Time 0956    PT Time Calculation (min) 41 min    Activity Tolerance Patient tolerated treatment well    Behavior During Therapy [facility] for tasks assessed/performed              Past Medical History:  Diagnosis Date   Aortic regurgitation    Mild by echo 07/2022   Arthritis    "knees" (01/02/2017)   Ascending aorta dilatation ([place])    30m by CT 2017 and 421mby CT 2019, 4337my echo 07/2020, 34m30m echo 2022, 41 mm by echo 2023   CAD (coronary artery disease)    [place] Unstable angina/LHC 8/8/7/3/7106plicated by V fib arrest after contrast injection. Cath 06/19/2015 s/p DES to mid LAD and RPDA. c. 11/2016: chest pain/ abnormal nuclear stress test prompting cath 11/13/16 showing 80% ostial diag, 60% mid RCA, no acute lesions, medical therapy recommended.   Dyslipidemia    Essential hypertension    Lung nodule < 6cm on CT 06/16/2015   Right lung   Mitral regurgitation    mild to moderate by echo 07/2020   Mitral regurgitation    Mild by echo 07/2022   OSA on CPAP    Persistent atrial fibrillation (HCC)[place] a. 06/2015 noted to be in new a-fib when arrived with unstable angina. Converted to NSR after being shocked in the cath lab for vfib;  b. 06/2015 Eliquis initiated as PAF noted on event monitor. c. Recurrent atrial fib?flutter 10/2016, issues with recurrent AF requiring multiple DCCVs in 02/2017. Failed sotalol, not candidate for Tikosyn due to cost/QTC, Multaq not felt likely strong enough.   Pulmonary nodule 07/2015   a. 1.5 x 1.2 cm  smoothly marginated nodule in the central aspect of the right lower lobe with recommendation correlation with nonemergent PET-CT to exclude a neoplasm , stable by CT 07/2016   Severe left ventricular hypertrophy    Ventricular fibrillation (HCC)[place] a. occured during cath 06/18/2015.   Past Surgical History:  Procedure Laterality Date   CARDIAC CATHETERIZATION N/A 06/18/2015   Procedure: Left Heart Cath and Coronary Angiography;  Surgeon: ChriBurnell Blanks;  [facility];  Service: Cardiovascular;  Laterality: N/A;   CARDIAC CATHETERIZATION N/A 06/19/2015   Procedure: Coronary Stent Intervention;  Surgeon: Peter M JordMartinique;  [facility];  Service: Cardiovascular;  Laterality: N/A;   CARDIAC CATHETERIZATION N/A 11/13/2016   Procedure: Left Heart Cath and Coronary Angiography;  Surgeon: JonaLorretta Harp;  [facility];  Service: Cardiovascular;  Laterality: N/A;   CARDIOVERSION N/A 11/17/2016   Procedure: CARDIOVERSION;  Surgeon: DaltLarey Dresser;  [facility];  Laterality: N/A;   CARDIOVERSION N/A 01/03/2017   Procedure: CARDIOVERSION;  Surgeon: BriaLelon Perla;  [facility];  Laterality: N/A;   CARDIOVERSION N/A 02/05/2017   Procedure: Cardioversion;  Surgeon: Evans Lance, MD;  Location: [place] [facility];  Service: Cardiovascular;  Laterality: N/A;   CARDIOVERSION N/A 02/20/2017   Procedure: Cardioversion;  Surgeon: Evans Lance, MD;  Location: [place] [facility];  Service: Cardiovascular;  Laterality: N/A;   CARTILAGE SURGERY Bilateral    "thumbs"   CATARACT EXTRACTION W/ INTRAOCULAR LENS  IMPLANT, BILATERAL Bilateral    CORONARY ANGIOPLASTY WITH STENT PLACEMENT  ~ 2007   JOINT REPLACEMENT     KNEE ARTHROSCOPY Right    RETINAL DETACHMENT SURGERY Bilateral    "laser thing"   TEE WITHOUT CARDIOVERSION N/A 11/17/2016   Procedure: TRANSESOPHAGEAL ECHOCARDIOGRAM  (TEE);  Surgeon: Larey Dresser, MD;  Location: [place];  Service: Cardiovascular;  Laterality: N/A;   TOTAL KNEE ARTHROPLASTY Left 2000s   TOTAL KNEE ARTHROPLASTY Right 02/08/2018   Procedure: RIGHT TOTAL KNEE ARTHROPLASTY;  Surgeon: Gaynelle Arabian, MD;  [facility]: [facility];  Service: Orthopedics;  Laterality: Right;  with block   Patient Active Problem List   Diagnosis Date Noted   Aortic regurgitation 07/15/2022   Acute cystitis 11/17/2020   AKI (acute kidney injury) ([place]) 11/16/2020   Thrombocytopenia ([place]) 11/16/2020   Sepsis ([place]) 11/15/2020   Ascending aorta dilatation (HCC)    OA (osteoarthritis) of knee 02/08/2018   Environmental and seasonal allergies 07/06/2015   Pulmonary nodule 06/27/2015   Mitral regurgitation    Ventricular fibrillation ([place])    Hyperlipidemia 06/18/2015   CAD (coronary artery disease), native coronary artery 06/17/2015   Persistent atrial fibrillation with RVR ([place]), successful TEE DCCV 11/17/16 06/16/2015   Essential (primary) hypertension 04/11/2015   Abnormal presence of protein in urine 04/11/2015    REFERRING DIAG: Other intervertebral disc displacement, lumbar region [M51.26]    THERAPY DIAG:  Other low back pain  Muscle weakness  Rationale for Evaluation and Treatment Rehabilitation  PERTINENT HISTORY: Persistent afib   PRECAUTIONS: Chronic L2 pars defects with grade 1 anterolisthesis   SUBJECTIVE:                                                                                                                                                                                      SUBJECTIVE STATEMENT:    Pt reports that manual therapy helps his pain, but nothing else "touches it".   PAIN:  Are you having pain? Yes Pain location: low back, L LE (mainly lateral lower leg) NPRS scale:  8/10 Aggravating factors: standing and walking 15 min Relieving factors: sitting or lying down Pain description: intermittent, constant, burning, and  aching Stage: Chronic Stability: staying the same 24 hour pattern: activity related    OBJECTIVE: (objective measures completed at initial evaluation  unless otherwise dated)   DIAGNOSTIC FINDINGS:    MRI:   IMPRESSION: 1. L4-5 disc extrusion with left lateral recess and left neural foraminal stenosis. 2. Chronic L2 pars defects with grade 1 anterolisthesis, severe disc degeneration, and mild-to-moderate neural foraminal stenosis.     SENSATION:          Light touch: Appears intact   MUSCLE LENGTH: Loss of lumbar lordosis Hamstrings: Right significant restriction; Left significant restriction ASLR: Right ASLR = PSLR; Left ASLR = PSLR Thomas test: Right subtle restriction; Left subtle restriction Ely's test: Right significant restriction; Left significant restriction     LUMBAR AROM   AROM AROM  11/13/2022  Flexion Fingertips to knees (limited by ~50%), w/ concordant pain  Extension limited by 50%, w/ concordant pain  Right lateral flexion limited by 50%, w/ concordant pain  Left lateral flexion limited by 50%, w/ concordant pain  Right rotation limited by 50%  Left rotation limited by 50%    (Blank rows = not tested)     LUMBAR SPECIAL TESTS:  Straight leg raise: L (-), R (-) Slump: L (equivocal), R (equivocal)   LE MMT:   MMT Right 11/13/2022 Left 11/13/2022  Hip flexion (L2, L3) 4 4  Knee extension (L3) 4 4  Knee flexion 4 4  Hip abduction 4+ 4+  Hip extension      Hip external rotation      Hip internal rotation      Hip adduction      Ankle dorsiflexion (L4)      Ankle plantarflexion (S1)      Ankle inversion      Ankle eversion      Great Toe ext (L5)      Grossly        (Blank rows = not tested, score listed is out of 5 possible points.  N = WNL, D = diminished, C = clear for gross weakness with myotome testing, * = concordant pain with testing)    PALPATION:            TTP lower lumbar spine.  PA lower lumbar spine - concordant pain   PATIENT  SURVEYS:  FOTO 51 -> 60     TODAY'S TREATMENT  Creating, reviewing, and completing below HEP   PATIENT EDUCATION:  POC, diagnosis, prognosis, HEP, and outcome measures.  Pt educated via explanation, demonstration, and handout (HEP).  Pt confirms understanding verbally.    HOME EXERCISE PROGRAM: Access Code: [email or web address] URL: [email or web address] Date: 11/21/2022 Prepared by: Shearon Balo  Exercises - Seated Sciatic Tensioner  - 2 x daily - 7 x weekly - 20 reps - Hooklying Isometric Clamshell  - 1 x daily - 7 x weekly - 3 sets - 10 reps - Supine Hip Adduction Isometric with Ball  - 1 x daily - 7 x weekly - 2 sets - 10 reps - 10'' hold - Bridge  - 1 x daily - 7 x weekly - 2 sets - 10 reps - 5-10'' hold Added 11/26/22 - Supine Sciatic Nerve Glide  - 2 x daily - 7 x weekly - 20 reps - Supine Active Straight Leg Raise  - 1 x daily - 7 x weekly - 3 sets - 10 reps   ASTERISK SIGNS     Asterisk Signs Eval (11/13/2022)            Pain 6-10/10            Lumbar flexion To knees w/ P!  walking 15 min            HS length Sig limitation bil                             TREATMENT: Therapeutic Exercise: - Nustep level 8 x 8 mins - LTR - 20x - Alternating clam - black TB 3x10 - alternating march - black TB - 3x10 - hip adduction ring squeeze - 2x10 - 5'' - bridge - x10  Manual Therapy  PA and UPA L4-L5 G III - G IV, to start and end session Gentle lumbar traction with feet on P-ball    ASSESSMENT:   CLINICAL IMPRESSION: Mussa tolerated session well with no adverse reaction.  Adriane remains limited in higher intensity exerciess by significant back pain.  He continues to respond well in the short term to manual therapy, but this effect is not durable.  Progress note next visit.   OBJECTIVE IMPAIRMENTS: Pain, lumbar ROM, hip and core strength   ACTIVITY LIMITATIONS: walking, standing, bending, lifting   PERSONAL FACTORS: See medical history and  pertinent history     REHAB POTENTIAL: Fair chronic   CLINICAL DECISION MAKING: Evolving/moderate complexity   EVALUATION COMPLEXITY: Low     GOALS:     SHORT TERM GOALS: Target date: 12/11/2022   Tamario will be >75% HEP compliant to improve carryover between sessions and facilitate independent management of condition   Evaluation (11/13/2022): ongoing Goal status: met 1/26     LONG TERM GOALS: Target date: 01/08/2023   Serafino will improve FOTO score to 60 as a proxy for functional improvement   Evaluation/Baseline (11/13/2022): 51 Goal status: INITIAL     2.  Jayshon will self report >/= 50% decrease in pain from evaluation    Evaluation/Baseline (11/13/2022): 10/10 max pain 1/26: 9/10 Goal status: ongoing     3.  Demon will be able to walk for 60 min, not limited by pain    Evaluation/Baseline (11/13/2022): 15 min Goal status: INITIAL     4.  Keano will be able to play pickle ball, not limited by pain   Evaluation/Baseline (11/13/2022): limited 1/26: unable Goal status: ongoing     5.  Tayshun will report confidence in self management of condition at time of discharge with advanced HEP   Evaluation/Baseline (11/13/2022): unable to self manage Goal status: INITIAL     PLAN: PT FREQUENCY: 1-2x/week   PT DURATION: 8 weeks (Ending 01/08/2023)   PLANNED INTERVENTIONS: Therapeutic exercises, Aquatic therapy, Therapeutic activity, Neuro Muscular re-education, Gait training, Patient/Family education, Joint mobilization, Dry Needling, Electrical stimulation, Spinal mobilization and/or manipulation, Moist heat, Taping, Vasopneumatic device, Ionotophoresis '4mg'$ /ml Dexamethasone, and Manual therapy   PLAN FOR NEXT SESSION: Neural mobility, PA lower lumbar spine, TDN?, core and hip strengthening   Mathis Dad, PT 12/12/2022, 9:58 AM

## 2022-12-15 DIAGNOSIS — M5416 Radiculopathy, lumbar region: Secondary | ICD-10-CM | POA: Diagnosis not present

## 2022-12-17 ENCOUNTER — Encounter: Payer: Self-pay | Admitting: Physical Therapy

## 2022-12-17 ENCOUNTER — Ambulatory Visit: Payer: Medicare Other | Admitting: Physical Therapy

## 2022-12-17 DIAGNOSIS — M5459 Other low back pain: Secondary | ICD-10-CM | POA: Diagnosis not present

## 2022-12-17 DIAGNOSIS — M6281 Muscle weakness (generalized): Secondary | ICD-10-CM | POA: Diagnosis not present

## 2022-12-17 NOTE — Therapy (Signed)
Progress Note Reporting Period 1/04 to 2/7  See note below for Objective Data and Assessment of Progress/Goals.      Patient Name: Troy [name] MRN: [id number] DOB:[birth year], 72 y.o., male Today's Date: 12/17/2022  PCP: Velna Hatchet, MD  REFERRING PROVIDER: Kary Kos, MD   END OF SESSION:   PT End of Session - 12/17/22 0957     Visit Number 10    Date for PT Re-Evaluation 01/08/23    Authorization Type MCR - FOTO    Progress Note Due on Visit 10    PT Start Time 0915    PT Stop Time 0956    PT Time Calculation (min) 41 min    Activity Tolerance Patient tolerated treatment well    Behavior During Therapy [facility] for tasks assessed/performed               Past Medical History:  Diagnosis Date   Aortic regurgitation    Mild by echo 07/2022   Arthritis    "knees" (01/02/2017)   Ascending aorta dilatation ([place])    47m by CT 2017 and 424mby CT 2019, 4370my echo 07/2020, 24m76m echo 2022, 41 mm by echo 2023   CAD (coronary artery disease)    a. s/[place] PCI in 2010 [place] Unstable angina/LHC 8/8/4/0/9811plicated by V fib arrest after contrast injection. Cath 06/19/2015 s/p DES to mid LAD and RPDA. c. 11/2016: chest pain/ abnormal nuclear stress test prompting cath 11/13/16 showing 80% ostial diag, 60% mid RCA, no acute lesions, medical therapy recommended.   Dyslipidemia    Essential hypertension    Lung nodule < 6cm on CT 06/16/2015   Right lung   Mitral regurgitation    mild to moderate by echo 07/2020   Mitral regurgitation    Mild by echo 07/2022   OSA on CPAP    Persistent atrial fibrillation (HCC)[place] a. 06/2015 noted to be in new a-fib when arrived with unstable angina. Converted to NSR after being shocked in the cath lab for vfib;  b. 06/2015 Eliquis initiated as PAF noted on event monitor. c. Recurrent atrial fib?flutter 10/2016, issues with recurrent AF requiring multiple DCCVs in 02/2017. Failed sotalol, not candidate for Tikosyn due to cost/QTC, Multaq not  felt likely strong enough.   Pulmonary nodule 07/2015   a. 1.5 x 1.2 cm smoothly marginated nodule in the central aspect of the right lower lobe with recommendation correlation with nonemergent PET-CT to exclude a neoplasm , stable by CT 07/2016   Severe left ventricular hypertrophy    Ventricular fibrillation (HCC)[place] a. occured during cath 06/18/2015.   Past Surgical History:  Procedure Laterality Date   CARDIAC CATHETERIZATION N/A 06/18/2015   Procedure: Left Heart Cath and Coronary Angiography;  Surgeon: ChriBurnell Blanks;  [facility];  Service: Cardiovascular;  Laterality: N/A;   CARDIAC CATHETERIZATION N/A 06/19/2015   Procedure: Coronary Stent Intervention;  Surgeon: Peter M JordMartinique;  [facility];  Service: Cardiovascular;  Laterality: N/A;   CARDIAC CATHETERIZATION N/A 11/13/2016   Procedure: Left Heart Cath and Coronary Angiography;  Surgeon: JonaLorretta Harp;  [facility];  Service: Cardiovascular;  Laterality: N/A;   CARDIOVERSION N/A 11/17/2016   Procedure: CARDIOVERSION;  Surgeon: DaltLarey Dresser;  [facility];  Laterality: N/A;   CARDIOVERSION N/A 01/03/2017   Procedure: CARDIOVERSION;  Surgeon: BriaLelon Perla;  [facility];  Service: Cardiovascular;  Laterality: N/A;   CARDIOVERSION N/A 02/05/2017   Procedure: Cardioversion;  Surgeon: Evans Lance, MD;  Location: [place] [facility];  Service: Cardiovascular;  Laterality: N/A;   CARDIOVERSION N/A 02/20/2017   Procedure: Cardioversion;  Surgeon: Evans Lance, MD;  Location: [place] [facility];  Service: Cardiovascular;  Laterality: N/A;   CARTILAGE SURGERY Bilateral    "thumbs"   CATARACT EXTRACTION W/ INTRAOCULAR LENS  IMPLANT, BILATERAL Bilateral    CORONARY ANGIOPLASTY WITH STENT PLACEMENT  ~ 2007   JOINT REPLACEMENT     KNEE ARTHROSCOPY Right    RETINAL DETACHMENT SURGERY Bilateral    "laser thing"   TEE  WITHOUT CARDIOVERSION N/A 11/17/2016   Procedure: TRANSESOPHAGEAL ECHOCARDIOGRAM (TEE);  Surgeon: Larey Dresser, MD;  Location: [place];  Service: Cardiovascular;  Laterality: N/A;   TOTAL KNEE ARTHROPLASTY Left 2000s   TOTAL KNEE ARTHROPLASTY Right 02/08/2018   Procedure: RIGHT TOTAL KNEE ARTHROPLASTY;  Surgeon: Gaynelle Arabian, MD;  [facility];  Service: Orthopedics;  Laterality: Right;  with block   Patient Active Problem List   Diagnosis Date Noted   Aortic regurgitation 07/15/2022   Acute cystitis 11/17/2020   AKI (acute kidney injury) ([place]) 11/16/2020   Thrombocytopenia ([place]) 11/16/2020   Sepsis ([place]) 11/15/2020   Ascending aorta dilatation (HCC)    OA (osteoarthritis) of knee 02/08/2018   Environmental and seasonal allergies 07/06/2015   Pulmonary nodule 06/27/2015   Mitral regurgitation    Ventricular fibrillation ([place])    Hyperlipidemia 06/18/2015   CAD (coronary artery disease), native coronary artery 06/17/2015   Persistent atrial fibrillation with RVR ([place]), successful TEE DCCV 11/17/16 06/16/2015   Essential (primary) hypertension 04/11/2015   Abnormal presence of protein in urine 04/11/2015    REFERRING DIAG: Other intervertebral disc displacement, lumbar region [M51.26]    THERAPY DIAG:  Other low back pain  Muscle weakness  Rationale for Evaluation and Treatment Rehabilitation  PERTINENT HISTORY: Persistent afib   PRECAUTIONS: Chronic L2 pars defects with grade 1 anterolisthesis   SUBJECTIVE:                                                                                                                                                                                      SUBJECTIVE STATEMENT:    Pt reports that he received an injection on Monday, but this has been minimally helpful so far.   PAIN:  Are you having pain? Yes Pain location: low back, L LE (mainly lateral lower leg) NPRS scale:  8/10 Aggravating factors: standing and walking 15  min Relieving factors: sitting or lying down Pain description: intermittent, constant, burning, and aching  Stage: Chronic Stability: staying the same 24 hour pattern: activity related    OBJECTIVE: (objective measures completed at initial evaluation unless otherwise dated)   DIAGNOSTIC FINDINGS:    MRI:   IMPRESSION: 1. L4-5 disc extrusion with left lateral recess and left neural foraminal stenosis. 2. Chronic L2 pars defects with grade 1 anterolisthesis, severe disc degeneration, and mild-to-moderate neural foraminal stenosis.     SENSATION:          Light touch: Appears intact   MUSCLE LENGTH: Loss of lumbar lordosis Hamstrings: Right significant restriction; Left significant restriction ASLR: Right ASLR = PSLR; Left ASLR = PSLR Thomas test: Right subtle restriction; Left subtle restriction Ely's test: Right significant restriction; Left significant restriction     LUMBAR AROM   AROM AROM  11/13/2022  Flexion Fingertips to knees (limited by ~50%), w/ concordant pain  Extension limited by 50%, w/ concordant pain  Right lateral flexion limited by 50%, w/ concordant pain  Left lateral flexion limited by 50%, w/ concordant pain  Right rotation limited by 50%  Left rotation limited by 50%    (Blank rows = not tested)     LUMBAR SPECIAL TESTS:  Straight leg raise: L (-), R (-) Slump: L (equivocal), R (equivocal)   LE MMT:   MMT Right 11/13/2022 Left 11/13/2022  Hip flexion (L2, L3) 4 4  Knee extension (L3) 4 4  Knee flexion 4 4  Hip abduction 4+ 4+  Hip extension      Hip external rotation      Hip internal rotation      Hip adduction      Ankle dorsiflexion (L4)      Ankle plantarflexion (S1)      Ankle inversion      Ankle eversion      Great Toe ext (L5)      Grossly        (Blank rows = not tested, score listed is out of 5 possible points.  N = WNL, D = diminished, C = clear for gross weakness with myotome testing, * = concordant pain with testing)     PALPATION:            TTP lower lumbar spine.  PA lower lumbar spine - concordant pain   PATIENT SURVEYS:  FOTO 51 -> 60     TODAY'S TREATMENT  Creating, reviewing, and completing below HEP   PATIENT EDUCATION:  POC, diagnosis, prognosis, HEP, and outcome measures.  Pt educated via explanation, demonstration, and handout (HEP).  Pt confirms understanding verbally.    HOME EXERCISE PROGRAM: Access Code: [email or web address] URL: [email or web address] Date: 11/21/2022 Prepared by: Shearon Balo  Exercises - Seated Sciatic Tensioner  - 2 x daily - 7 x weekly - 20 reps - Hooklying Isometric Clamshell  - 1 x daily - 7 x weekly - 3 sets - 10 reps - Supine Hip Adduction Isometric with Ball  - 1 x daily - 7 x weekly - 2 sets - 10 reps - 10'' hold - Bridge  - 1 x daily - 7 x weekly - 2 sets - 10 reps - 5-10'' hold Added 11/26/22 - Supine Sciatic Nerve Glide  - 2 x daily - 7 x weekly - 20 reps - Supine Active Straight Leg Raise  - 1 x daily - 7 x weekly - 3 sets - 10 reps   ASTERISK SIGNS     Asterisk Signs Eval (11/13/2022)            Pain  6-10/10            Lumbar flexion To knees w/ P!            walking 15 min            HS length Sig limitation bil                             TREATMENT: Therapeutic Exercise: - Nustep level 8 x 8 mins - LTR - 20x - Alternating clam - black TB 3x10 - alternating march - black TB - 3x10 - hip adduction ring squeeze - 2x10 - 5'' - bridge - x10  Manual Therapy  PA and UPA L4-L5 G III - G IV, to start and end session Gentle lumbar traction with feet on P-ball    ASSESSMENT:   CLINICAL IMPRESSION: Jeferson tolerated session well with no adverse reaction.  Pt continues to be limited by pain following injection.  Reports pain relief following manual therapy.  Will discuss D/C if no significant improvement is noted next visit.    OBJECTIVE IMPAIRMENTS: Pain, lumbar ROM, hip and core strength   ACTIVITY LIMITATIONS: walking, standing,  bending, lifting   PERSONAL FACTORS: See medical history and pertinent history     REHAB POTENTIAL: Fair chronic   CLINICAL DECISION MAKING: Evolving/moderate complexity   EVALUATION COMPLEXITY: Low     GOALS:     SHORT TERM GOALS: Target date: 12/11/2022   [place] will be >75% HEP compliant to improve carryover between sessions and facilitate independent management of condition   Evaluation (11/13/2022): ongoing Goal status: met 1/26     LONG TERM GOALS: Target date: 01/08/2023   [place] will improve FOTO score to 60 as a proxy for functional improvement   Evaluation/Baseline (11/13/2022): 51 Goal status: INITIAL     2.  Hanzel will self report >/= 50% decrease in pain from evaluation    Evaluation/Baseline (11/13/2022): 10/10 max pain 1/26: 9/10 Goal status: ongoing     3.  Stanislav will be able to walk for 60 min, not limited by pain    Evaluation/Baseline (11/13/2022): 15 min 2/7: 20 min Goal status: ongoing     4.  Dajon will be able to play pickle ball, not limited by pain   Evaluation/Baseline (11/13/2022): limited 1/26: unable Goal status: ongoing     5.  Dewitt will report confidence in self management of condition at time of discharge with advanced HEP   Evaluation/Baseline (11/13/2022): unable to self manage 2/7: still having high levels of pain Goal status: ongoing     PLAN: PT FREQUENCY: 1-2x/week   PT DURATION: 8 weeks (Ending 01/08/2023)   PLANNED INTERVENTIONS: Therapeutic exercises, Aquatic therapy, Therapeutic activity, Neuro Muscular re-education, Gait training, Patient/Family education, Joint mobilization, Dry Needling, Electrical stimulation, Spinal mobilization and/or manipulation, Moist heat, Taping, Vasopneumatic device, Ionotophoresis '4mg'$ /ml Dexamethasone, and Manual therapy   PLAN FOR NEXT SESSION: Neural mobility, PA lower lumbar spine, TDN?, core and hip strengthening   Mathis Dad, PT 12/17/2022, 10:56 AM

## 2022-12-19 ENCOUNTER — Encounter: Payer: Self-pay | Admitting: Physical Therapy

## 2022-12-19 ENCOUNTER — Ambulatory Visit: Payer: Medicare Other | Admitting: Physical Therapy

## 2022-12-19 DIAGNOSIS — M6281 Muscle weakness (generalized): Secondary | ICD-10-CM | POA: Diagnosis not present

## 2022-12-19 DIAGNOSIS — M5459 Other low back pain: Secondary | ICD-10-CM | POA: Diagnosis not present

## 2022-12-19 NOTE — Therapy (Signed)
PHYSICAL THERAPY DISCHARGE SUMMARY  Visits from Start of Care: 11  Current functional level related to goals / functional outcomes: See assessment/goals   Remaining deficits: See assessment/goals   Education / Equipment: HEP and D/C plans  Patient agrees to discharge. Patient goals were not met. Patient is being discharged due to lack of progress.   Patient Name: Troy [name] MRN: [id number] DOB:1951/06/07, 72 y.o., male Today's Date: 12/19/2022  PCP: Velna Hatchet, MD  REFERRING PROVIDER: Kary Kos, MD   END OF SESSION:   PT End of Session - 12/19/22 0903     Visit Number 11    Date for PT Re-Evaluation 01/08/23    Authorization Type MCR - FOTO    Progress Note Due on Visit 10    PT Start Time 0900    PT Stop Time 0941    PT Time Calculation (min) 41 min    Activity Tolerance Patient tolerated treatment well    Behavior During Therapy [facility] for tasks assessed/performed               Past Medical History:  Diagnosis Date   Aortic regurgitation    Mild by echo 07/2022   Arthritis    "knees" (01/02/2017)   Ascending aorta dilatation ([place])    74m by CT 2017 and 481mby CT 2019, 4390my echo 07/2020, 50m29m echo 2022, 41 mm by echo 2023   CAD (coronary artery disease)    a. [place] Unstable angina/LHC 8/8/A999333plicated by V fib arrest after contrast injection. Cath 06/19/2015 s/p DES to mid LAD and RPDA. c. 11/2016: chest pain/ abnormal nuclear stress test prompting cath 11/13/16 showing 80% ostial diag, 60% mid RCA, no acute lesions, medical therapy recommended.   Dyslipidemia    Essential hypertension    Lung nodule < 6cm on CT 06/16/2015   Right lung   Mitral regurgitation    mild to moderate by echo 07/2020   Mitral regurgitation    Mild by echo 07/2022   OSA on CPAP    Persistent atrial fibrillation (HCC)[place] a. 06/2015 noted to be in new a-fib when arrived with unstable angina. Converted to NSR after being shocked in the cath lab  for vfib;  b. 06/2015 Eliquis initiated as PAF noted on event monitor. c. Recurrent atrial fib?flutter 10/2016, issues with recurrent AF requiring multiple DCCVs in 02/2017. Failed sotalol, not candidate for Tikosyn due to cost/QTC, Multaq not felt likely strong enough.   Pulmonary nodule 07/2015   a. 1.5 x 1.2 cm smoothly marginated nodule in the central aspect of the right lower lobe with recommendation correlation with nonemergent PET-CT to exclude a neoplasm , stable by CT 07/2016   Severe left ventricular hypertrophy    Ventricular fibrillation (HCC)[place] a. occured during cath 06/18/2015.   Past Surgical History:  Procedure Laterality Date   CARDIAC CATHETERIZATION N/A 06/18/2015   Procedure: Left Heart Cath and Coronary Angiography;  Surgeon: ChriBurnell Blanks;  [facility];  Service: Cardiovascular;  Laterality: N/A;   CARDIAC CATHETERIZATION N/A 06/19/2015   Procedure: Coronary Stent Intervention;  Surgeon: Peter M JordMartinique;  [facility];  Service: Cardiovascular;  Laterality: N/A;   CARDIAC CATHETERIZATION N/A 11/13/2016   Procedure: Left Heart Cath and Coronary Angiography;  Surgeon: JonaLorretta Harp;  [facility];  Service: Cardiovascular;  Laterality: N/A;   CARDIOVERSION N/A 11/17/2016  Procedure: CARDIOVERSION;  Surgeon: Larey Dresser, MD;  Location: [place];  Service: Cardiovascular;  Laterality: N/A;   CARDIOVERSION N/A 01/03/2017   Procedure: CARDIOVERSION;  Surgeon: Lelon Perla, MD;  Location: [place];  Service: Cardiovascular;  Laterality: N/A;   CARDIOVERSION N/A 02/05/2017   Procedure: Cardioversion;  Surgeon: Evans Lance, MD;  Location: [place] [facility];  Service: Cardiovascular;  Laterality: N/A;   CARDIOVERSION N/A 02/20/2017   Procedure: Cardioversion;  Surgeon: Evans Lance, MD;  Location: [place] [facility];  Service: Cardiovascular;  Laterality: N/A;   CARTILAGE SURGERY Bilateral    "thumbs"    CATARACT EXTRACTION W/ INTRAOCULAR LENS  IMPLANT, BILATERAL Bilateral    CORONARY ANGIOPLASTY WITH STENT PLACEMENT  ~ 2007   JOINT REPLACEMENT     KNEE ARTHROSCOPY Right    RETINAL DETACHMENT SURGERY Bilateral    "laser thing"   TEE WITHOUT CARDIOVERSION N/A 11/17/2016   Procedure: TRANSESOPHAGEAL ECHOCARDIOGRAM (TEE);  Surgeon: Larey Dresser, MD;  Location: [place];  Service: Cardiovascular;  Laterality: N/A;   TOTAL KNEE ARTHROPLASTY Left 2000s   TOTAL KNEE ARTHROPLASTY Right 02/08/2018   Procedure: RIGHT TOTAL KNEE ARTHROPLASTY;  Surgeon: Gaynelle Arabian, MD;  Location: [facility];  Service: Orthopedics;  Laterality: Right;  with block   Patient Active Problem List   Diagnosis Date Noted   Aortic regurgitation 07/15/2022   Acute cystitis 11/17/2020   AKI (acute kidney injury) ([place]) 11/16/2020   Thrombocytopenia ([place]) 11/16/2020   Sepsis ([place]) 11/15/2020   Ascending aorta dilatation (HCC)    OA (osteoarthritis) of knee 02/08/2018   Environmental and seasonal allergies 07/06/2015   Pulmonary nodule 06/27/2015   Mitral regurgitation    Ventricular fibrillation ([place])    Hyperlipidemia 06/18/2015   CAD (coronary artery disease), native coronary artery 06/17/2015   Persistent atrial fibrillation with RVR ([place]), successful TEE DCCV 11/17/16 06/16/2015   Essential (primary) hypertension 04/11/2015   Abnormal presence of protein in urine 04/11/2015    REFERRING DIAG: Other intervertebral disc displacement, lumbar region [M51.26]    THERAPY DIAG:  Other low back pain  Muscle weakness  Rationale for Evaluation and Treatment Rehabilitation  PERTINENT HISTORY: Persistent afib   PRECAUTIONS: Chronic L2 pars defects with grade 1 anterolisthesis   SUBJECTIVE:                                                                                                                                                                                      SUBJECTIVE STATEMENT:    Pt states that his low  back pain remains unchanged.  He feels he is ready for D/C.   PAIN:  Are you having  pain? Yes Pain location: low back, L LE (mainly lateral lower leg) NPRS scale:  8/10 Aggravating factors: standing and walking 15 min Relieving factors: sitting or lying down Pain description: intermittent, constant, burning, and aching Stage: Chronic Stability: staying the same 24 hour pattern: activity related    OBJECTIVE: (objective measures completed at initial evaluation unless otherwise dated)   DIAGNOSTIC FINDINGS:    MRI:   IMPRESSION: 1. L4-5 disc extrusion with left lateral recess and left neural foraminal stenosis. 2. Chronic L2 pars defects with grade 1 anterolisthesis, severe disc degeneration, and mild-to-moderate neural foraminal stenosis.     SENSATION:          Light touch: Appears intact   MUSCLE LENGTH: Loss of lumbar lordosis Hamstrings: Right significant restriction; Left significant restriction ASLR: Right ASLR = PSLR; Left ASLR = PSLR Thomas test: Right subtle restriction; Left subtle restriction Ely's test: Right significant restriction; Left significant restriction     LUMBAR AROM   AROM AROM  11/13/2022  Flexion Fingertips to knees (limited by ~50%), w/ concordant pain  Extension limited by 50%, w/ concordant pain  Right lateral flexion limited by 50%, w/ concordant pain  Left lateral flexion limited by 50%, w/ concordant pain  Right rotation limited by 50%  Left rotation limited by 50%    (Blank rows = not tested)     LUMBAR SPECIAL TESTS:  Straight leg raise: L (-), R (-) Slump: L (equivocal), R (equivocal)   LE MMT:   MMT Right 11/13/2022 Left 11/13/2022  Hip flexion (L2, L3) 4 4  Knee extension (L3) 4 4  Knee flexion 4 4  Hip abduction 4+ 4+  Hip extension      Hip external rotation      Hip internal rotation      Hip adduction      Ankle dorsiflexion (L4)      Ankle plantarflexion (S1)      Ankle inversion      Ankle eversion       Great Toe ext (L5)      Grossly        (Blank rows = not tested, score listed is out of 5 possible points.  N = WNL, D = diminished, C = clear for gross weakness with myotome testing, * = concordant pain with testing)    PALPATION:            TTP lower lumbar spine.  PA lower lumbar spine - concordant pain   PATIENT SURVEYS:  FOTO 51 -> 60     TODAY'S TREATMENT  Creating, reviewing, and completing below HEP   PATIENT EDUCATION:  POC, diagnosis, prognosis, HEP, and outcome measures.  Pt educated via explanation, demonstration, and handout (HEP).  Pt confirms understanding verbally.    HOME EXERCISE PROGRAM: Access Code: [email or web address] URL: [email or web address] Date: 11/21/2022 Prepared by: Shearon Balo  Exercises - Seated Sciatic Tensioner  - 2 x daily - 7 x weekly - 20 reps - Hooklying Isometric Clamshell  - 1 x daily - 7 x weekly - 3 sets - 10 reps - Supine Hip Adduction Isometric with Ball  - 1 x daily - 7 x weekly - 2 sets - 10 reps - 10'' hold - Bridge  - 1 x daily - 7 x weekly - 2 sets - 10 reps - 5-10'' hold Added 11/26/22 - Supine Sciatic Nerve Glide  - 2 x daily - 7 x weekly - 20 reps - Supine Active Straight Leg Raise  - 1  x daily - 7 x weekly - 3 sets - 10 reps   ASTERISK SIGNS     Asterisk Signs Eval (11/13/2022)            Pain 6-10/10            Lumbar flexion To knees w/ P!            walking 15 min            HS length Sig limitation bil                             TREATMENT: Therapeutic Exercise: - Nustep level 8 x 8 mins - LTR - 20x - Alternating clam - black TB 2x10 - alternating march - black TB - 2x10 - hip adduction ring squeeze - 2x10 - 5''   Manual Therapy  PA and UPA L4-L5 G III - G IV, to start and end session Gentle lumbar traction with feet on P-ball  Therapeutic Activity - collecting information for goals, checking progress, and reviewing with patient    ASSESSMENT:   CLINICAL IMPRESSION: Tyrik Croom has  progressed fair with therapy.  Improved impairments include: minimal improvement.  Functional improvements include: minimal improvement.  Progressions needed include: continue to work on HEP as tolerated.  Barriers to progress include: severe chronic pain.  Please see GOALS section for progress on short term and long term goals established at evaluation.  I recommend D/C home with HEP; pt agrees with plan and will follow up with spine specialist.    OBJECTIVE IMPAIRMENTS: Pain, lumbar ROM, hip and core strength   ACTIVITY LIMITATIONS: walking, standing, bending, lifting   PERSONAL FACTORS: See medical history and pertinent history     REHAB POTENTIAL: Fair chronic   CLINICAL DECISION MAKING: Evolving/moderate complexity   EVALUATION COMPLEXITY: Low     GOALS:     SHORT TERM GOALS: Target date: 12/11/2022   Ralphie will be >75% HEP compliant to improve carryover between sessions and facilitate independent management of condition   Evaluation (11/13/2022): ongoing Goal status: met 1/26     LONG TERM GOALS: Target date: 01/08/2023   Buzz will improve FOTO score to 60 as a proxy for functional improvement   Evaluation/Baseline (11/13/2022): 51 2/9: 36 Goal status: NOT MET     2.  [place] will self report >/= 50% decrease in pain from evaluation    Evaluation/Baseline (11/13/2022): 10/10 max pain 1/26: 9/10 2/9: 8/10 Goal status: NOT MET     3.  Tyrone will be able to walk for 60 min, not limited by pain    Evaluation/Baseline (11/13/2022): 15 min 2/7: 20 min Goal status: NOT MET     4.  Yicheng will be able to play pickle ball, not limited by pain   Evaluation/Baseline (11/13/2022): limited 1/26: unable 12/19/2022: unable Goal status: NOT MET     5.  [place] will report confidence in self management of condition at time of discharge with advanced HEP   Evaluation/Baseline (11/13/2022): unable to self manage 2/7: still having high levels of pain Goal status: NOT MET      PLAN: PT FREQUENCY: 1-2x/week   PT DURATION: 8 weeks (Ending 01/08/2023)   PLANNED INTERVENTIONS: Therapeutic exercises, Aquatic therapy, Therapeutic activity, Neuro Muscular re-education, Gait training, Patient/Family education, Joint mobilization, Dry Needling, Electrical stimulation, Spinal mobilization and/or manipulation, Moist heat, Taping, Vasopneumatic device, Ionotophoresis 61m/ml Dexamethasone, and Manual therapy   PLAN FOR NEXT SESSION:  Neural mobility, PA lower lumbar spine, TDN?, core and hip strengthening   Mathis Dad, PT 12/19/2022, 9:03 AM

## 2022-12-22 DIAGNOSIS — Z7901 Long term (current) use of anticoagulants: Secondary | ICD-10-CM | POA: Diagnosis not present

## 2022-12-22 DIAGNOSIS — D6869 Other thrombophilia: Secondary | ICD-10-CM | POA: Diagnosis not present

## 2022-12-22 DIAGNOSIS — I4891 Unspecified atrial fibrillation: Secondary | ICD-10-CM | POA: Diagnosis not present

## 2022-12-24 ENCOUNTER — Ambulatory Visit: Payer: Medicare Other | Admitting: Physical Therapy

## 2022-12-25 ENCOUNTER — Ambulatory Visit: Payer: Medicare Other | Admitting: Physical Therapy

## 2023-01-06 ENCOUNTER — Telehealth: Payer: Self-pay | Admitting: *Deleted

## 2023-01-06 DIAGNOSIS — M5126 Other intervertebral disc displacement, lumbar region: Secondary | ICD-10-CM | POA: Diagnosis not present

## 2023-01-06 DIAGNOSIS — M5416 Radiculopathy, lumbar region: Secondary | ICD-10-CM | POA: Diagnosis not present

## 2023-01-06 NOTE — Telephone Encounter (Signed)
Covering preop. Last OV 09/2022 clinically doing well to return in 1 yr. Will route to pharm then pt will need VV.

## 2023-01-06 NOTE — Telephone Encounter (Signed)
   Pre-operative Risk Assessment    Patient Name: Troy [name]  DOB: 01-Mar-1951 MRN: [id number]      Request for Surgical Clearance    Procedure:   L4-5 LUMBAR MICRODISKECTOMY   Date of Surgery:  Clearance TBD                                 Surgeon:  Elaina Hoops Surgeon's Group or Practice Name:  [facility] Phone number:  [phone] Fax number:  [phone]   Type of Clearance Requested:   - Medical  - Pharmacy:  Hold Warfarin (Coumadin) NOT INDICATED HOW LONG   Type of Anesthesia:  General    Additional requests/questions:    Astrid Divine   01/06/2023, 1:29 PM

## 2023-01-07 DIAGNOSIS — D225 Melanocytic nevi of trunk: Secondary | ICD-10-CM | POA: Diagnosis not present

## 2023-01-07 DIAGNOSIS — L814 Other melanin hyperpigmentation: Secondary | ICD-10-CM | POA: Diagnosis not present

## 2023-01-07 DIAGNOSIS — C44519 Basal cell carcinoma of skin of other part of trunk: Secondary | ICD-10-CM | POA: Diagnosis not present

## 2023-01-07 DIAGNOSIS — Z85828 Personal history of other malignant neoplasm of skin: Secondary | ICD-10-CM | POA: Diagnosis not present

## 2023-01-07 DIAGNOSIS — L570 Actinic keratosis: Secondary | ICD-10-CM | POA: Diagnosis not present

## 2023-01-07 DIAGNOSIS — L821 Other seborrheic keratosis: Secondary | ICD-10-CM | POA: Diagnosis not present

## 2023-01-08 ENCOUNTER — Telehealth: Payer: Self-pay | Admitting: *Deleted

## 2023-01-08 NOTE — Telephone Encounter (Signed)
   Name: Troy [name]  DOB: 10-28-1951  MRN: [id number]  Primary Cardiologist: Fransico Him, MD  Chart reviewed as part of pre-operative protocol coverage. Because of Troy [name]'s past medical history and time since last visit, he will require a follow-up telephone visit in order to better assess preoperative cardiovascular risk.  Pre-op covering staff: - Please schedule appointment and call patient to inform them. If patient already had an upcoming appointment within acceptable timeframe, please add "pre-op clearance" to the appointment notes so provider is aware. - Please contact requesting surgeon's office via preferred method (i.e, phone, fax) to inform them of need for appointment prior to surgery.  INR managed by [facility] medical. Holding parameters will need to come from them.  Elgie Collard, PA-C  01/08/2023, 11:06 AM

## 2023-01-08 NOTE — Telephone Encounter (Signed)
INR is managed by [facility], [facility] office. Clearance should be routed to [facility]

## 2023-01-08 NOTE — Telephone Encounter (Signed)
Pt has been scheduled for tele pre op appt 01/14/23 @ 2 pm. Med rec and consent are done.     Patient Consent for Virtual Visit        Troy [name] has provided verbal consent on 01/08/2023 for a virtual visit (video or telephone).   CONSENT FOR VIRTUAL VISIT FOR:  Troy [name]  By participating in this virtual visit I agree to the following:  I hereby voluntarily request, consent and authorize [facility] and its employed or contracted physicians, physician assistants, nurse practitioners or other licensed health care professionals (the Practitioner), to provide me with telemedicine health care services (the "Services") as deemed necessary by the treating Practitioner. I acknowledge and consent to receive the Services by the Practitioner via telemedicine. I understand that the telemedicine visit will involve communicating with the Practitioner through live audiovisual communication technology and the disclosure of certain medical information by electronic transmission. I acknowledge that I have been given the opportunity to request an in-person assessment or other available alternative prior to the telemedicine visit and am voluntarily participating in the telemedicine visit.  I understand that I have the right to withhold or withdraw my consent to the use of telemedicine in the course of my care at any time, without affecting my right to future care or treatment, and that the Practitioner or I may terminate the telemedicine visit at any time. I understand that I have the right to inspect all information obtained and/or recorded in the course of the telemedicine visit and may receive copies of available information for a reasonable fee.  I understand that some of the potential risks of receiving the Services via telemedicine include:  Delay or interruption in medical evaluation due to technological equipment failure or disruption; Information transmitted may not be sufficient (e.g. poor  resolution of images) to allow for appropriate medical decision making by the Practitioner; and/or  In rare instances, security protocols could fail, causing a breach of personal health information.  Furthermore, I acknowledge that it is my responsibility to provide information about my medical history, conditions and care that is complete and accurate to the best of my ability. I acknowledge that Practitioner's advice, recommendations, and/or decision may be based on factors not within their control, such as incomplete or inaccurate data provided by me or distortions of diagnostic images or specimens that may result from electronic transmissions. I understand that the practice of medicine is not an exact science and that Practitioner makes no warranties or guarantees regarding treatment outcomes. I acknowledge that a copy of this consent can be made available to me via my patient portal ([facility]), or I can request a printed copy by calling the office of [facility].    I understand that my insurance will be billed for this visit.   I have read or had this consent read to me. I understand the contents of this consent, which adequately explains the benefits and risks of the Services being provided via telemedicine.  I have been provided ample opportunity to ask questions regarding this consent and the Services and have had my questions answered to my satisfaction. I give my informed consent for the services to be provided through the use of telemedicine in my medical care

## 2023-01-08 NOTE — Telephone Encounter (Signed)
Pt has been scheduled for tele pre op appt 01/14/23 @ 2 pm. Med rec and consent are done.

## 2023-01-13 NOTE — Progress Notes (Unsigned)
Virtual Visit via Telephone Note   Because of Troy [name]'s co-morbid illnesses, he is at least at moderate risk for complications without adequate follow up.  This format is felt to be most appropriate for this patient at this time.  The patient did not have access to video technology/had technical difficulties with video requiring transitioning to audio format only (telephone).  All issues noted in this document were discussed and addressed.  No physical exam could be performed with this format.  Please refer to the patient's chart for his consent to telehealth for [facility].  Evaluation Performed:  Preoperative cardiovascular risk assessment _____________   Date:  01/13/2023   Patient ID:  Troy [name], DOB 1951-10-06, MRN [id number] Patient Location:  Home Provider location:   Office  Primary Care Provider:  Velna Hatchet, MD Primary Cardiologist:  Troy Him, MD  Chief Complaint / Patient Profile   72 y.o. y/o male with a h/o coronary artery disease, permanent atrial fibrillation, HLD, HTN who is pending lumbar [name] and presents today for telephonic preoperative cardiovascular risk assessment.  History of Present Illness    Troy [name] is a 72 y.o. male who presents via audio/video conferencing for a telehealth visit today.  Pt was last seen in cardiology clinic on 09/19/2022 by Dr. Radford [name].  At that time Troy [name] was doing well .  The patient is now pending procedure as outlined above. Since his last visit, he remains stable from a cardiac standpoint.  Today he denies chest pain, shortness of breath, lower extremity edema, fatigue, palpitations, melena, hematuria, hemoptysis, diaphoresis, weakness, presyncope, syncope, orthopnea, and PND.   Past Medical History    Past Medical History:  Diagnosis Date   Aortic regurgitation    Mild by echo 07/2022   Arthritis    "knees" (01/02/2017)   Ascending aorta dilatation (HCC)    54m by CT 2017  and 473mby CT 2019, 434my echo 07/2020, 6m100m echo 2022, 41 mm by echo 2023   CAD (coronary artery disease)    a. s/p PCI in 2010 [place] Unstable angina/LHC 8/8/A999333plicated by V fib arrest after contrast injection. Cath 06/19/2015 s/p DES to mid LAD and RPDA. c. 11/2016: chest pain/ abnormal nuclear stress test prompting cath 11/13/16 showing 80% ostial diag, 60% mid RCA, no acute lesions, medical therapy recommended.   Dyslipidemia    Essential hypertension    Lung nodule < 6cm on CT 06/16/2015   Right lung   Mitral regurgitation    mild to moderate by echo 07/2020   Mitral regurgitation    Mild by echo 07/2022   OSA on CPAP    Persistent atrial fibrillation (HCC)[place] a. 06/2015 noted to be in new a-fib when arrived with unstable angina. Converted to NSR after being shocked in the cath lab for vfib;  b. 06/2015 Eliquis initiated as PAF noted on event monitor. c. Recurrent atrial fib?flutter 10/2016, issues with recurrent AF requiring multiple DCCVs in 02/2017. Failed sotalol, not candidate for Tikosyn due to cost/QTC, Multaq not felt likely strong enough.   Pulmonary nodule 07/2015   a. 1.5 x 1.2 cm smoothly marginated nodule in the central aspect of the right lower lobe with recommendation correlation with nonemergent PET-CT to exclude a neoplasm , stable by CT 07/2016   Severe left ventricular hypertrophy    Ventricular fibrillation (HCC)[place] a. occured during cath 06/18/2015.   Past Surgical History:  Procedure Laterality Date  CARDIAC CATHETERIZATION N/A 06/18/2015   Procedure: Left Heart Cath and Coronary Angiography;  Surgeon: Burnell Blanks, MD;  Location: [place] [facility];  Service: Cardiovascular;  Laterality: N/A;   CARDIAC CATHETERIZATION N/A 06/19/2015   Procedure: Coronary Stent Intervention;  Surgeon: Peter M Martinique, MD;  Location: [place] [facility];  Service: Cardiovascular;  Laterality: N/A;   CARDIAC CATHETERIZATION N/A 11/13/2016   Procedure: Left Heart Cath  and Coronary Angiography;  Surgeon: Lorretta Harp, MD;  Location: [place] [facility];  Service: Cardiovascular;  Laterality: N/A;   CARDIOVERSION N/A 11/17/2016   Procedure: CARDIOVERSION;  Surgeon: Larey Dresser, MD;  Location: [place];  Service: Cardiovascular;  Laterality: N/A;   CARDIOVERSION N/A 01/03/2017   Procedure: CARDIOVERSION;  Surgeon: Lelon Perla, MD;  Location: [place];  Service: Cardiovascular;  Laterality: N/A;   CARDIOVERSION N/A 02/05/2017   Procedure: Cardioversion;  Surgeon: Evans Lance, MD;  Location: [place] [facility];  Service: Cardiovascular;  Laterality: N/A;   CARDIOVERSION N/A 02/20/2017   Procedure: Cardioversion;  Surgeon: Evans Lance, MD;  Location: [place] [facility];  Service: Cardiovascular;  Laterality: N/A;   CARTILAGE SURGERY Bilateral    "thumbs"   CATARACT EXTRACTION W/ INTRAOCULAR LENS  IMPLANT, BILATERAL Bilateral    CORONARY ANGIOPLASTY WITH STENT PLACEMENT  ~ 2007   JOINT REPLACEMENT     KNEE ARTHROSCOPY Right    RETINAL DETACHMENT SURGERY Bilateral    "laser thing"   TEE WITHOUT CARDIOVERSION N/A 11/17/2016   Procedure: TRANSESOPHAGEAL ECHOCARDIOGRAM (TEE);  Surgeon: Larey Dresser, MD;  Location: [place];  Service: Cardiovascular;  Laterality: N/A;   TOTAL KNEE ARTHROPLASTY Left 2000s   TOTAL KNEE ARTHROPLASTY Right 02/08/2018   Procedure: RIGHT TOTAL KNEE ARTHROPLASTY;  Surgeon: Gaynelle Arabian, MD;  Location: [facility];  Service: Orthopedics;  Laterality: Right;  with block    Allergies  Allergies  Allergen Reactions   Gluten Meal Other (See Comments)    REACTION: Celiac disease (severe stomach pain) REACTION: Celiac disease (severe stomach pain)    Iodinated Contrast Media Other (See Comments) and Anaphylaxis    V-Fib arrest during cardiac cath suspected d/t contrast dye in 2016 Cardiac arrest Cardiac arrest V-Fib arrest during cardiac cath suspected d/t contrast dye in 2016 Cardiac arrest   Metrizamide Other (See  Comments)    V-Fib arrest during cardiac cath suspected d/t contrast dye in 2016   Tizanidine Anxiety    Depression   [place] Other (See Comments)    REACTION: Celiac disease (severe stomach pain)   Morphine And Related Other (See Comments)    REACTION: "REALLY BAD STOMACH PAIN"   Rosuvastatin     Myalgias on '40mg'$  daily   Apple Juice Diarrhea   Tizanidine Hcl Anxiety    Depression   Wheat Bran Other (See Comments)    Home Medications    Prior to Admission medications   Medication Sig Start Date End Date Taking? Authorizing Provider  ALPRAZolam Duanne Moron) 1 MG tablet Take 0.5-1 mg by mouth daily as needed for anxiety. 11/13/20   [provider]  losartan (COZAAR) 100 MG tablet TAKE 1 TABLET(100 MG) BY MOUTH DAILY 09/19/22   Sueanne Margarita, MD  metoprolol tartrate (LOPRESSOR) 100 MG tablet TAKE 1 TABLET(100 MG) BY MOUTH TWICE DAILY 09/19/22   Turner, Eber Hong, MD  REPATHA SURECLICK [id number] MG/ML SOAJ ADMINISTER 1 ML UNDER THE SKIN EVERY 14 DAYS 08/08/22   Sueanne Margarita, MD  spironolactone (ALDACTONE) 25 MG  tablet  06/20/22   [provider]  TURMERIC PO Take 1 tablet by mouth 2 (two) times daily.    [provider]  warfarin (COUMADIN) 5 MG tablet TAKE AS DIRECTED BY ANTICOAGULATION CLINIC 10/10/20   Constance Haw, MD    Physical Exam    Vital Signs:  Troy [name] does not have vital signs available for review today.  Given telephonic nature of communication, physical exam is limited. AAOx3. NAD. Normal affect.  Speech and respirations are unlabored.  Accessory Clinical Findings    None  Assessment & Plan    1.  Preoperative Cardiovascular Risk Assessment: Troy [name], Dr. Elaina Hoops, [place] [facility]     Primary Cardiologist: Troy Him, MD  Chart reviewed as part of pre-operative protocol coverage. Given past medical history and time since last visit, based on ACC/AHA guidelines, Cedar Nardiello would be at acceptable  risk for the planned procedure without further cardiovascular testing.   His RCRI is a class III risk, 6.6% risk of major cardiac event.  He is able to complete greater than 4 METS of physical activity.  The patient was advised that if he develops new symptoms prior to surgery to contact our office to arrange for a follow-up visit, and he verbalized understanding.  INR is managed by [facility], [facility] office. Clearance should be routed to [employer]    I will route this recommendation to the requesting party via [facility] fax function and remove from pre-op pool.      Time:   Today, I have spent 5 minutes with the patient with telehealth technology discussing medical history, symptoms, and management plan.  Prior to his phone evaluation I spent greater than 10 minutes reviewing his past medical history and cardiac medications.   Deberah Pelton, NP  01/13/2023, 3:59 PM

## 2023-01-14 ENCOUNTER — Ambulatory Visit: Payer: Medicare Other | Attending: Internal Medicine

## 2023-01-14 DIAGNOSIS — Z01810 Encounter for preprocedural cardiovascular examination: Secondary | ICD-10-CM

## 2023-01-30 ENCOUNTER — Other Ambulatory Visit: Payer: Self-pay | Admitting: Neurosurgery

## 2023-01-30 NOTE — Telephone Encounter (Signed)
   Patient Name: Troy [name]  DOB: 1950-12-05 MRN: [id number]  Primary Cardiologist: Fransico Him, MD   Chart reviewed as part of pre-operative protocol coverage. Given past medical history and time since last visit, based on ACC/AHA guidelines, Telly Coreas would be at acceptable risk for the planned procedure without further cardiovascular testing.    His RCRI is a class III risk, 6.6% risk of major cardiac event.  He is able to complete greater than 4 METS of physical activity.   INR is managed by [facility], [facility] office. Clearance should be routed to [employer] route this bundled recommendation to requesting provider via Epic fax function and remove from pre-op pool. Please call with questions.  Elgie Collard, PA-C 01/30/2023, 12:36 PM

## 2023-02-04 NOTE — Progress Notes (Signed)
Dr Windy Carina office was contacted regarding preop orders (left the voicemail)

## 2023-02-04 NOTE — Pre-Procedure Instructions (Signed)
Surgical Instructions    Your procedure is scheduled on February 11, 2023.  Report to [facility] Main Entrance "A" at 12:30 P.M., then check in with the Admitting office.  Call this number if you have problems the morning of surgery:  [phone]  If you have any questions prior to your surgery date call [phone]: Open Monday-Friday 8am-4pm If you experience any cold or flu symptoms such as cough, fever, chills, shortness of breath, etc. between now and your scheduled surgery, please notify us at the above number.     Remember:  Do not eat after midnight the night before your surgery  You may drink clear liquids until 11:30 AM the morning of your surgery.   Clear liquids allowed are: Water, Non-Citrus Juices (without pulp), Carbonated Beverages, Clear Tea, Black Coffee Only (NO MILK, CREAM OR POWDERED CREAMER of any kind), and Gatorade.     Take these medicines the morning of surgery with A SIP OF WATER:  metoprolol tartrate (LOPRESSOR)    Please follow your prescribers instructions on if/when to hold your warfarin (COUMADIN).   As of today, STOP taking any Aspirin (unless otherwise instructed by your surgeon) Aleve, Naproxen, Ibuprofen, Motrin, Advil, Goody's, BC's, all herbal medications, fish oil, and all vitamins.                     Do NOT Smoke (Tobacco/Vaping) for 24 hours prior to your procedure.  If you use a CPAP at night, you may bring your mask/headgear for your overnight stay.   Contacts, glasses, piercing's, hearing aid's, dentures or partials may not be worn into surgery, please bring cases for these belongings.    For patients admitted to the hospital, discharge time will be determined by your treatment team.   Patients discharged the day of surgery will not be allowed to drive home, and someone needs to stay with them for 24 hours.  SURGICAL WAITING ROOM VISITATION Patients having surgery or a procedure may have no more than 2 support people in the waiting  area - these visitors may rotate.   Children under the age of 17 must have an adult with them who is not the patient. If the patient needs to stay at the hospital during part of their recovery, the visitor guidelines for inpatient rooms apply. Pre-op nurse will coordinate an appropriate time for 1 support person to accompany patient in pre-op.  This support person may not rotate.   Please refer to the [facility] website for the visitor guidelines for Inpatients (after your surgery is over and you are in a regular room).    Special instructions:   [place]- Preparing For Surgery  Before surgery, you can play an important role. Because skin is not sterile, your skin needs to be as free of germs as possible. You can reduce the number of germs on your skin by washing with CHG (chlorahexidine gluconate) Soap before surgery.  CHG is an antiseptic cleaner which kills germs and bonds with the skin to continue killing germs even after washing.    Oral Hygiene is also important to reduce your risk of infection.  Remember - BRUSH YOUR TEETH THE MORNING OF SURGERY WITH YOUR REGULAR TOOTHPASTE  Please do not use if you have an allergy to CHG or antibacterial soaps. If your skin becomes reddened/irritated stop using the CHG.  Do not shave (including legs and underarms) for at least 48 hours prior to first CHG shower. It is OK to shave your face.  Please follow these instructions carefully.   Shower the NIGHT BEFORE SURGERY and the MORNING OF SURGERY  If you chose to wash your hair, wash your hair first as usual with your normal shampoo.  After you shampoo, rinse your hair and body thoroughly to remove the shampoo.  Use CHG Soap as you would any other liquid soap. You can apply CHG directly to the skin and wash gently with a scrungie or a clean washcloth.   Apply the CHG Soap to your body ONLY FROM THE NECK DOWN.  Do not use on open wounds or open sores. Avoid contact with your eyes, ears, mouth and  genitals (private parts). Wash Face and genitals (private parts)  with your normal soap.   Wash thoroughly, paying special attention to the area where your surgery will be performed.  Thoroughly rinse your body with warm water from the neck down.  DO NOT shower/wash with your normal soap after using and rinsing off the CHG Soap.  Pat yourself dry with a CLEAN TOWEL.  Wear CLEAN PAJAMAS to bed the night before surgery  Place CLEAN SHEETS on your bed the night before your surgery  DO NOT SLEEP WITH PETS.   Day of Surgery: Take a shower with CHG soap. Do not wear jewelry or makeup Do not wear lotions, powders, perfumes/colognes, or deodorant. Do not shave 48 hours prior to surgery.  Men may shave face and neck. Do not bring valuables to the hospital.  [facility] is not responsible for any belongings or valuables. Do not wear nail polish, gel polish, artificial nails, or any other type of covering on natural nails (fingers and toes) If you have artificial nails or gel coating that need to be removed by a nail salon, please have this removed prior to surgery. Artificial nails or gel coating may interfere with anesthesia's ability to adequately monitor your vital signs.  Wear Clean/Comfortable clothing the morning of surgery Remember to brush your teeth WITH YOUR REGULAR TOOTHPASTE.   Please read over the following fact sheets that you were given.    If you received a COVID test during your pre-op visit  it is requested that you wear a mask when out in public, stay away from anyone that may not be feeling well and notify your surgeon if you develop symptoms. If you have been in contact with anyone that has tested positive in the last 10 days please notify you surgeon.

## 2023-02-05 ENCOUNTER — Encounter (HOSPITAL_COMMUNITY): Payer: Self-pay

## 2023-02-05 ENCOUNTER — Encounter (HOSPITAL_COMMUNITY)
Admission: RE | Admit: 2023-02-05 | Discharge: 2023-02-05 | Disposition: A | Discharge status: Home / Self Care | Payer: Medicare Other | Source: Ambulatory Visit | Attending: Neurosurgery | Admitting: Neurosurgery

## 2023-02-05 ENCOUNTER — Other Ambulatory Visit: Payer: Self-pay

## 2023-02-05 VITALS — BP 126/84 | HR 68 | Temp 97.6°F | Resp 20 | Ht 71.0 in | Wt 242.5 lb

## 2023-02-05 DIAGNOSIS — Z01812 Encounter for preprocedural laboratory examination: Secondary | ICD-10-CM | POA: Diagnosis not present

## 2023-02-05 DIAGNOSIS — Z01818 Encounter for other preprocedural examination: Secondary | ICD-10-CM

## 2023-02-05 DIAGNOSIS — I2510 Atherosclerotic heart disease of native coronary artery without angina pectoris: Secondary | ICD-10-CM | POA: Diagnosis not present

## 2023-02-05 HISTORY — DX: Cardiac arrhythmia, unspecified: I49.9

## 2023-02-05 HISTORY — DX: Angina pectoris, unspecified: I20.9

## 2023-02-05 LAB — BASIC METABOLIC PANEL
Anion gap: 9 (ref 5–15)
BUN: 20 mg/dL (ref 8–23)
CO2: 26 mmol/L (ref 22–32)
Calcium: 9.1 mg/dL (ref 8.9–10.3)
Chloride: 104 mmol/L (ref 98–111)
Creatinine, Ser: 1.08 mg/dL (ref 0.61–1.24)
GFR, Estimated: >60 mL/min (ref >60)
Glucose, Bld: 114 mg/dL — ABNORMAL HIGH (ref 70–99)
Potassium: 4.3 mmol/L (ref 3.5–5.1)
Sodium: 139 mmol/L (ref 135–145)

## 2023-02-05 LAB — CBC
HCT: 45.7 % (ref 39.0–52.0)
Hemoglobin: 15.1 g/dL (ref 13.0–17.0)
MCH: 30.9 pg (ref 26.0–34.0)
MCHC: 33 g/dL (ref 30.0–36.0)
MCV: 93.5 fL (ref 80.0–100.0)
Platelets: 131 10*3/uL — ABNORMAL LOW (ref 150–400)
RBC: 4.89 MIL/uL (ref 4.22–5.81)
RDW: 13.1 % (ref 11.5–15.5)
WBC: 6 10*3/uL (ref 4.0–10.5)
nRBC: 0 % (ref 0.0–0.2)

## 2023-02-05 LAB — SURGICAL PCR SCREEN
MRSA, PCR: NEGATIVE
Staphylococcus aureus: NEGATIVE

## 2023-02-05 NOTE — Progress Notes (Signed)
PCP - Dr. Velna Hatchet Cardiologist - Dr. Fransico Him  PPM/ICD - Denies Device Orders - n/a Rep Notified - n/a  Chest x-ray - n/a EKG - 09/19/2022 Stress Test - 11/11/2016 ECHO - 07/11/2022 Cardiac Cath - 11/13/2016  Sleep Study - +OSA. Wears CPAP nightly. Not aware of pressure settings.  No DM  Last dose of GLP1 agonist- n/a GLP1 instructions: n/a  Blood Thinner Instructions: Per instructions, pts last dose of Coumadin was 3/27 PM. He will continue to hold until after surgery. He denies being told to also start a Lovenox bridge. Aspirin Instructions: n/a  ERAS Protcol - Clear liquids until 1130 morning of surgery PRE-SURGERY Ensure or G2- n/a  COVID TEST- n/a   Anesthesia review: Yes. Cardiac Clearance 01/14/23   Patient denies shortness of breath, fever, cough and chest pain at PAT appointment. Pt denies any respiratory illness/infection in the last two months. He does endorse a runny nose that he attributes to seasonal allergies. Pt instructed to notify hospital if he begins having additional symptoms such as cough, fever, sore throat. Pt understood instructions.   All instructions explained to the patient, with a verbal understanding of the material. Patient agrees to go over the instructions while at home for a better understanding. Patient also instructed to self quarantine after being tested for COVID-19. The opportunity to ask questions was provided.

## 2023-02-06 NOTE — Anesthesia Preprocedure Evaluation (Signed)
Anesthesia Evaluation  Patient identified by MRN, date of birth, ID band Patient awake    Reviewed: Allergy & Precautions, NPO status , Patient's Chart, lab work & pertinent test results, reviewed documented beta blocker date and time   History of Anesthesia Complications Negative for: history of anesthetic complications  Airway Mallampati: II  TM Distance: >3 FB Neck ROM: Full    Dental  (+) Dental Advisory Given   Pulmonary sleep apnea and Continuous Positive Airway Pressure Ventilation  Pulm nodule   breath sounds clear to auscultation       Cardiovascular hypertension, Pt. on medications and Pt. on home beta blockers (-) angina + CAD and + Cardiac Stents  + dysrhythmias Ventricular Fibrillation  Rhythm:Regular Rate:Normal  '23 ECHO: EF 55-60%, normal LVF, mildly dilated LV, mild LVH, normal RVF, mild AI   Neuro/Psych Back pain    GI/Hepatic negative GI ROS, Neg liver ROS,,,  Endo/Other  BMI 32  Renal/GU Renal InsufficiencyRenal disease     Musculoskeletal  (+) Arthritis ,    Abdominal   Peds  Hematology coumadin   Anesthesia Other Findings   Reproductive/Obstetrics                              Anesthesia Physical Anesthesia Plan  ASA: 3  Anesthesia Plan: General   Post-op Pain Management: Tylenol PO (pre-op)*   Induction: Intravenous  PONV Risk Score and Plan: 2 and Ondansetron and Dexamethasone  Airway Management Planned: Oral ETT  Additional Equipment: None  Intra-op Plan:   Post-operative Plan: Extubation in OR  Informed Consent: I have reviewed the patients History and Physical, chart, labs and discussed the procedure including the risks, benefits and alternatives for the proposed anesthesia with the patient or authorized representative who has indicated his/her understanding and acceptance.     Dental advisory given  Plan Discussed with: CRNA and  Surgeon  Anesthesia Plan Comments: (PAT note written 02/06/2023 by Myra Gianotti, PA-C.  )        Anesthesia Quick Evaluation

## 2023-02-06 NOTE — Progress Notes (Signed)
Anesthesia Chart Review:  Case: [id number] Date/Time: 02/11/23 1416   Procedure: Microdiscectomy - left - L4-L5 (Left: Back) - 3C   Anesthesia type: General   Pre-op diagnosis: HNP   Location: [facility] OR ROOM 20 / [place] [facility]   Surgeons: Troy Kos, [name]       DISCUSSION: Patient is a 72 year old male scheduled for the above procedure.  History includes never smoker, HTN, CAD (CX stent in [place] 2010; angina 06/2015, s/p DES LAD & DES RPDA 06/19/15), Vfib (during [place] 06/18/15), afib (DCCV x 5 2018, last 02/20/17), severe LVH, ascending thoracic aortic aneurysm (4.3 cm 09/30/22 CT), dyslipidemia, valvular regurgitation (mild MR/AR 07/2022), pulmonary nodule (no fissures pulmonary nodules 09/30/22 CT), OSA (uses CPAP), osteoarthritis (right TKA 02/08/18)   Telephonic preoperative cardiology evaluation on 01/14/23. He wrote, "Given past medical history and time since last visit, based on ACC/AHA guidelines, Troy [name] would be at acceptable risk for the planned procedure without further cardiovascular testing.    His RCRI is a class III risk, 6.6% risk of major cardiac event.  He is able to complete greater than 4 METS of physical activity..." Pharmacist and APP noted that INR is managed by [facility], [facility] office, so they advised warfarin instructions come from there.  He reported instructions to hold warfarin after 02/04/23 dose.   Anesthesia team to evaluate on the day of surgery. He is for PT/INR on arrival.    VS:  BP Readings from Last 3 Encounters:  02/05/23 126/84  10/07/22 (!) 142/86  09/19/22 128/82   Pulse Readings from Last 3 Encounters:  02/05/23 68  10/07/22 64  09/19/22 75    PROVIDERS: Troy Hatchet, [name] is PCP  Troy Him, [name] is cardiologist Troy [name], Troy [name] is EP. Last visit 04/01/21. Troy [name] was minimally symptomatic in afib and was not interested in ablation. He had recurrence after several DCCV. [place]ine with current medical management.  TAA has been  followed at [place], last visit 10/07/22 by Lars Pinks, PA-C. One year follow-up planned.   LABS: Most recent lab results in [facility] include: Lab Results  Component Value Date   WBC 6.0 02/05/2023   HGB 15.1 02/05/2023   HCT 45.7 02/05/2023   PLT 131 (L) 02/05/2023   GLUCOSE 114 (H) 02/05/2023   ALT 26 09/19/2022   AST 26 09/19/2022   NA 139 02/05/2023   K 4.3 02/05/2023   CL 104 02/05/2023   CREATININE 1.08 02/05/2023   BUN 20 02/05/2023   CO2 26 02/05/2023    Baseline Polysomnogram 01/02/2021: IMPRESSION:  1. Obstructive Sleep Apnea (OSA)  2. Dysfunctions associated with sleep stages or arousal from  sleep  3. Non-specific abnormal EKG  RECOMMENDATIONS:  1. This study demonstrates moderate obstructive sleep apnea, with  a total AHI of 27.4/hour, and O2 nadir of 83%. Ongoing treatment  with positive airway pressure is recommended...   IMAGES: CT Chest 09/30/22: IMPRESSION: 1. Stable mild fusiform dilatation of the ascending aorta, maximal diameter 4.3 cm. Consider continued annual imaging follow-up by CTA or MRA. 2. No acute chest findings. 3. No suspicious pulmonary nodules. Mild subsegmental atelectasis at both lung bases. 4. Coronary and aortic Atherosclerosis (ICD10-I70.0). - No AAA by 11/15/20 [place]  MRI L-spine 06/15/22: IMPRESSION: 1. L4-5 disc extrusion with left lateral recess and left neural foraminal stenosis. 2. Chronic L2 pars defects with grade 1 anterolisthesis, severe disc degeneration, and mild-to-moderate neural foraminal stenosis.    EKG: 05/19/2022 ([facility]): Atrial fibrillation at 75 bpm.  Abnormal QRS-T  angle, consider primary T wave abnormality   CV: Echo 07/11/22: IMPRESSIONS   1. Distal inferior hypokinesis. Left ventricular ejection fraction, by  estimation, is 55 to 60%. The left ventricle has normal function. The left  ventricle has no regional wall motion abnormalities. The left ventricular  internal cavity size was mildly   dilated. There is mild left ventricular hypertrophy. Left ventricular  diastolic parameters are indeterminate.   2. Right ventricular systolic function is normal. The right ventricular  size is normal.   3. Left atrial size was severely dilated.   4. Right atrial size was mildly dilated.   5. Mild mitral valve regurgitation.   6. The aortic valve is tricuspid. Aortic valve regurgitation is mild.  Aortic valve sclerosis/calcification is present, without any evidence of  aortic stenosis.   7. The inferior vena cava is normal in size with greater than 50%  respiratory variability, suggesting right atrial pressure of 3 mmHg.  - Comparison(s): The left ventricular function is unchanged.    MRI Cardiac 10/16/21: IMPRESSION: LV and RV function are moderately reduced. Mild thoracic aortic aneurysm.   Long term monitor 09/12/21-09/15/21: Predominant rhythm was atrial fibrillation with average heart rate 81bpm and ranged from 44 to 176bpm. Isolated pause 3 seconds in duration during sleep. Rare PVC, ventricular couplet and salvo.    Cardiac cath 11/13/26: Ost RPDA to RPDA lesion, 0 %stenosed. Prox Cx to Mid Cx lesion, 0 %stenosed. Prox LAD to Mid LAD lesion, 0 %stenosed. Ost 1st Diag lesion, 80 %stenosed. Mid RCA lesion, 60 %stenosed. IMPRESSION: Troy [name] stents are all patent. He has no cough or lesions. An LV gram was not performed today. The LVEDP was measured at 18. The sheath was removed and a TR band was placed on the right wrist to achieve patent hemostasis. The patient left the lab in stable condition. I believe that a novel oral anticoagulant will be restarted with the intention of performing DC cardioversion. The patient left the lab in stable condition.    Past Medical History:  Diagnosis Date   Anginal pain ([place])    Aortic regurgitation    Mild by echo 07/2022   Arthritis    "knees" (01/02/2017)   Ascending aorta dilatation (H[facility])    105mm by CT 2017 and 33mm by CT 2019, 53mm  by echo 07/2020, 86mm by echo 2022, 41 mm by echo 2023   CAD (coronary artery disease)    a. s/p PCI in 2010 in [place]. b. Unstable angina/LHC [id number] complicated by V fib arrest after contrast injection. Cath 06/19/2015 s/p DES to mid LAD and RPDA. c. 11/2016: chest pain/ abnormal nuclear stress test prompting cath 11/13/16 showing 80% ostial diag, 60% mid RCA, no acute lesions, medical therapy recommended.   Dyslipidemia    Dysrhythmia    A. Fib   Essential hypertension    Lung nodule < 6cm on CT 06/16/2015   Right lung   Mitral regurgitation    mild to moderate by echo 07/2020   Mitral regurgitation    Mild by echo 07/2022   OSA on CPAP    Persistent atrial fibrillation ([place])    a. 06/2015 noted to be in new a-fib when arrived with unstable angina. Converted to NSR after being shocked in the cath lab for vfib;  b. 06/2015 Eliquis initiated as PAF noted on event monitor. c. Recurrent atrial fib?flutter 10/2016, issues with recurrent AF requiring multiple DCCVs in 02/2017. Failed sotalol, not candidate for Tikosyn due to cost/QTC, Multaq not felt  likely strong enough.   Pulmonary nodule 07/2015   a. 1.5 x 1.2 cm smoothly marginated nodule in the central aspect of the right lower lobe with recommendation correlation with nonemergent PET-CT to exclude a neoplasm , stable by CT 07/2016   Severe left ventricular hypertrophy    Ventricular fibrillation ([place])    a. occured during cath 06/18/2015.    Past Surgical History:  Procedure Laterality Date   CARDIAC CATHETERIZATION N/A 06/18/2015   Procedure: Left Heart Cath and Coronary Angiography;  Surgeon: Burnell Blanks, [name];  Location: [place] [facility];  Service: Cardiovascular;  Laterality: N/A;   CARDIAC CATHETERIZATION N/A 06/19/2015   Procedure: Coronary Stent Intervention;  Surgeon: Peter M Martinique, [name];  Location: [place] [facility];  Service: Cardiovascular;  Laterality: N/A;   CARDIAC CATHETERIZATION N/A 11/13/2016   Procedure: Left  Heart Cath and Coronary Angiography;  Surgeon: Lorretta Harp, [name];  Location: [place] [facility];  Service: Cardiovascular;  Laterality: N/A;   CARDIOVERSION N/A 11/17/2016   Procedure: CARDIOVERSION;  Surgeon: Larey Dresser, [name];  Location: [place];  Service: Cardiovascular;  Laterality: N/A;   CARDIOVERSION N/A 01/03/2017   Procedure: CARDIOVERSION;  Surgeon: Lelon Perla, [name];  Location: [place];  Service: Cardiovascular;  Laterality: N/A;   CARDIOVERSION N/A 02/05/2017   Procedure: Cardioversion;  Surgeon: Evans Lance, [name];  Location: [place] [facility];  Service: Cardiovascular;  Laterality: N/A;   CARDIOVERSION N/A 02/20/2017   Procedure: Cardioversion;  Surgeon: Evans Lance, [name];  Location: [place] [facility];  Service: Cardiovascular;  Laterality: N/A;   CARTILAGE SURGERY Bilateral    "thumbs"   CATARACT EXTRACTION W/ INTRAOCULAR LENS  IMPLANT, BILATERAL Bilateral    COLONOSCOPY     CORONARY ANGIOPLASTY WITH STENT PLACEMENT  ~ 2007   JOINT REPLACEMENT     KNEE ARTHROSCOPY Right    RETINAL DETACHMENT SURGERY Bilateral    "laser thing"   TEE WITHOUT CARDIOVERSION N/A 11/17/2016   Procedure: TRANSESOPHAGEAL ECHOCARDIOGRAM (TEE);  Surgeon: Larey Dresser, [name];  Location: [place];  Service: Cardiovascular;  Laterality: N/A;   TOTAL KNEE ARTHROPLASTY Left 2000s   TOTAL KNEE ARTHROPLASTY Right 02/08/2018   Procedure: RIGHT TOTAL KNEE ARTHROPLASTY;  Surgeon: Gaynelle Arabian, [name];  Location: [facility];  Service: Orthopedics;  Laterality: Right;  with block    MEDICATIONS: No current facility-administered medications for this encounter.    ALPRAZolam (XANAX) 1 MG tablet   Ascorbic Acid (VITAMIN C PO)   losartan (COZAAR) 100 MG tablet   metoprolol tartrate (LOPRESSOR) 100 MG tablet   spironolactone (ALDACTONE) 25 MG tablet   TURMERIC PO   warfarin (COUMADIN) 5 MG tablet   REPATHA SURECLICK [id number] MG/ML SOAJ    Myra Gianotti, PA-C Surgical Short Stay/Anesthesiology [facility]  Phone [phone] [facility] Phone [phone] 02/06/2023 11:19 AM

## 2023-02-11 ENCOUNTER — Ambulatory Visit (HOSPITAL_COMMUNITY): Payer: Medicare Other

## 2023-02-11 ENCOUNTER — Ambulatory Visit (HOSPITAL_COMMUNITY)
Admission: RE | Admit: 2023-02-11 | Discharge: 2023-02-11 | Disposition: A | Discharge status: Home / Self Care | Payer: Medicare Other | Attending: Neurosurgery | Admitting: Neurosurgery

## 2023-02-11 ENCOUNTER — Other Ambulatory Visit: Payer: Self-pay

## 2023-02-11 ENCOUNTER — Ambulatory Visit (HOSPITAL_COMMUNITY): Payer: Medicare Other | Admitting: Vascular Surgery

## 2023-02-11 ENCOUNTER — Ambulatory Visit (HOSPITAL_COMMUNITY)
Admission: RE | Disposition: A | Discharge status: Home / Self Care | Payer: Self-pay | Source: Home / Self Care | Attending: Neurosurgery

## 2023-02-11 ENCOUNTER — Encounter (HOSPITAL_COMMUNITY): Payer: Self-pay | Admitting: Neurosurgery

## 2023-02-11 ENCOUNTER — Ambulatory Visit (HOSPITAL_BASED_OUTPATIENT_CLINIC_OR_DEPARTMENT_OTHER): Payer: Medicare Other | Admitting: Vascular Surgery

## 2023-02-11 DIAGNOSIS — N289 Disorder of kidney and ureter, unspecified: Secondary | ICD-10-CM

## 2023-02-11 DIAGNOSIS — M5126 Other intervertebral disc displacement, lumbar region: Secondary | ICD-10-CM | POA: Insufficient documentation

## 2023-02-11 DIAGNOSIS — G4733 Obstructive sleep apnea (adult) (pediatric): Secondary | ICD-10-CM | POA: Diagnosis not present

## 2023-02-11 DIAGNOSIS — R2681 Unsteadiness on feet: Secondary | ICD-10-CM | POA: Insufficient documentation

## 2023-02-11 DIAGNOSIS — Z955 Presence of coronary angioplasty implant and graft: Secondary | ICD-10-CM | POA: Diagnosis not present

## 2023-02-11 DIAGNOSIS — G4730 Sleep apnea, unspecified: Secondary | ICD-10-CM | POA: Diagnosis not present

## 2023-02-11 DIAGNOSIS — Z9989 Dependence on other enabling machines and devices: Secondary | ICD-10-CM

## 2023-02-11 DIAGNOSIS — I10 Essential (primary) hypertension: Secondary | ICD-10-CM | POA: Insufficient documentation

## 2023-02-11 DIAGNOSIS — M1990 Unspecified osteoarthritis, unspecified site: Secondary | ICD-10-CM | POA: Diagnosis not present

## 2023-02-11 DIAGNOSIS — I2510 Atherosclerotic heart disease of native coronary artery without angina pectoris: Secondary | ICD-10-CM | POA: Insufficient documentation

## 2023-02-11 DIAGNOSIS — M5116 Intervertebral disc disorders with radiculopathy, lumbar region: Secondary | ICD-10-CM | POA: Diagnosis not present

## 2023-02-11 DIAGNOSIS — Z981 Arthrodesis status: Secondary | ICD-10-CM | POA: Diagnosis not present

## 2023-02-11 HISTORY — PX: LUMBAR LAMINECTOMY/DECOMPRESSION MICRODISCECTOMY: SHX5026

## 2023-02-11 LAB — PROTIME-INR
INR: 1.1 (ref 0.8–1.2)
Prothrombin Time: 14.2 seconds (ref 11.4–15.2)

## 2023-02-11 SURGERY — LUMBAR LAMINECTOMY/DECOMPRESSION MICRODISCECTOMY 1 LEVEL
Anesthesia: General | Site: Spine Lumbar | Laterality: Left

## 2023-02-11 MED ORDER — SUGAMMADEX SODIUM 200 MG/2ML IV SOLN
INTRAVENOUS | Status: DC | PRN
  Administered 2023-02-11: 200 mg via INTRAVENOUS

## 2023-02-11 MED ORDER — BUPIVACAINE HCL (PF) 0.25 % IJ SOLN
INTRAMUSCULAR | Status: DC | PRN
  Administered 2023-02-11: 10 mL

## 2023-02-11 MED ORDER — ONDANSETRON HCL 4 MG/2ML IJ SOLN
INTRAMUSCULAR | Status: AC
  Filled 2023-02-11: qty 2

## 2023-02-11 MED ORDER — ONDANSETRON HCL 4 MG/2ML IJ SOLN: 4.0000 mg | Freq: Four times a day (QID) | INTRAMUSCULAR | Status: DC | PRN

## 2023-02-11 MED ORDER — ACETAMINOPHEN 325 MG PO TABS: 650.0000 mg | ORAL_TABLET | ORAL | Status: DC | PRN

## 2023-02-11 MED ORDER — DEXAMETHASONE SODIUM PHOSPHATE 10 MG/ML IJ SOLN
INTRAMUSCULAR | Status: AC
  Filled 2023-02-11: qty 1

## 2023-02-11 MED ORDER — LIDOCAINE 2% (20 MG/ML) 5 ML SYRINGE
INTRAMUSCULAR | Status: DC | PRN
  Administered 2023-02-11: 20 mg via INTRAVENOUS

## 2023-02-11 MED ORDER — SODIUM CHLORIDE 0.9% FLUSH: 3.0000 mL | INTRAVENOUS | Status: DC | PRN

## 2023-02-11 MED ORDER — LIDOCAINE 2% (20 MG/ML) 5 ML SYRINGE
INTRAMUSCULAR | Status: AC
  Filled 2023-02-11: qty 5

## 2023-02-11 MED ORDER — PHENOL 1.4 % MT LIQD: 1.0000 | OROMUCOSAL | Status: DC | PRN

## 2023-02-11 MED ORDER — MIDAZOLAM HCL 2 MG/2ML IJ SOLN
INTRAMUSCULAR | Status: DC | PRN
  Administered 2023-02-11: 1 mg via INTRAVENOUS

## 2023-02-11 MED ORDER — METOPROLOL TARTRATE 100 MG PO TABS
100.0000 mg | ORAL_TABLET | Freq: Two times a day (BID) | ORAL | Status: DC
  Filled 2023-02-11: qty 1

## 2023-02-11 MED ORDER — DEXAMETHASONE SODIUM PHOSPHATE 10 MG/ML IJ SOLN
INTRAMUSCULAR | Status: DC | PRN
  Administered 2023-02-11: 10 mg via INTRAVENOUS

## 2023-02-11 MED ORDER — HYDROMORPHONE HCL 1 MG/ML IJ SOLN
INTRAMUSCULAR | Status: AC
  Filled 2023-02-11: qty 1

## 2023-02-11 MED ORDER — ROCURONIUM BROMIDE 10 MG/ML (PF) SYRINGE
PREFILLED_SYRINGE | INTRAVENOUS | Status: AC
  Filled 2023-02-11: qty 10

## 2023-02-11 MED ORDER — ACETAMINOPHEN 650 MG RE SUPP: 650.0000 mg | RECTAL | Status: DC | PRN

## 2023-02-11 MED ORDER — OXYCODONE HCL 5 MG/5ML PO SOLN: 5.0000 mg | Freq: Once | ORAL | Status: DC | PRN

## 2023-02-11 MED ORDER — ONDANSETRON HCL 4 MG PO TABS: 4.0000 mg | ORAL_TABLET | Freq: Four times a day (QID) | ORAL | Status: DC | PRN

## 2023-02-11 MED ORDER — ACETAMINOPHEN 500 MG PO TABS
1000.0000 mg | ORAL_TABLET | Freq: Once | ORAL | Status: AC
  Administered 2023-02-11: 1000 mg via ORAL
  Filled 2023-02-11: qty 2

## 2023-02-11 MED ORDER — CEFAZOLIN SODIUM-DEXTROSE 2-4 GM/100ML-% IV SOLN
2.0000 g | INTRAVENOUS | Status: AC
  Administered 2023-02-11: 2 g via INTRAVENOUS
  Filled 2023-02-11: qty 100

## 2023-02-11 MED ORDER — PROPOFOL 10 MG/ML IV BOLUS
INTRAVENOUS | Status: AC
  Filled 2023-02-11: qty 20

## 2023-02-11 MED ORDER — ALPRAZOLAM 0.5 MG PO TABS: 1.0000 mg | ORAL_TABLET | Freq: Every day | ORAL | Status: DC

## 2023-02-11 MED ORDER — PROMETHAZINE HCL 25 MG/ML IJ SOLN: 6.2500 mg | INTRAMUSCULAR | Status: DC | PRN

## 2023-02-11 MED ORDER — ALUM & MAG HYDROXIDE-SIMETH 200-200-20 MG/5ML PO SUSP: 30.0000 mL | Freq: Four times a day (QID) | ORAL | Status: DC | PRN

## 2023-02-11 MED ORDER — MENTHOL 3 MG MT LOZG: 1.0000 | LOZENGE | OROMUCOSAL | Status: DC | PRN

## 2023-02-11 MED ORDER — CYCLOBENZAPRINE HCL 10 MG PO TABS: 10.0000 mg | ORAL_TABLET | Freq: Three times a day (TID) | ORAL | Status: DC | PRN

## 2023-02-11 MED ORDER — THROMBIN 5000 UNITS EX SOLR
CUTANEOUS | Status: AC
  Filled 2023-02-11: qty 10000

## 2023-02-11 MED ORDER — LOSARTAN POTASSIUM 50 MG PO TABS: 100.0000 mg | ORAL_TABLET | Freq: Every day | ORAL | Status: DC

## 2023-02-11 MED ORDER — BUPIVACAINE HCL (PF) 0.25 % IJ SOLN
INTRAMUSCULAR | Status: AC
  Filled 2023-02-11: qty 30

## 2023-02-11 MED ORDER — CHLORHEXIDINE GLUCONATE 0.12 % MT SOLN
15.0000 mL | Freq: Once | OROMUCOSAL | Status: AC
  Administered 2023-02-11: 15 mL via OROMUCOSAL
  Filled 2023-02-11: qty 15

## 2023-02-11 MED ORDER — CYCLOBENZAPRINE HCL 10 MG PO TABS: 10.0000 mg | ORAL_TABLET | Freq: Three times a day (TID) | ORAL | 0 refills | Status: DC | PRN

## 2023-02-11 MED ORDER — LIDOCAINE-EPINEPHRINE 1 %-1:100000 IJ SOLN
INTRAMUSCULAR | Status: DC | PRN
  Administered 2023-02-11: 10 mL

## 2023-02-11 MED ORDER — LACTATED RINGERS IV SOLN: INTRAVENOUS | Status: DC

## 2023-02-11 MED ORDER — CHLORHEXIDINE GLUCONATE CLOTH 2 % EX PADS: 6.0000 | MEDICATED_PAD | Freq: Once | CUTANEOUS | Status: DC

## 2023-02-11 MED ORDER — 0.9 % SODIUM CHLORIDE (POUR BTL) OPTIME
TOPICAL | Status: DC | PRN
  Administered 2023-02-11: 1000 mL

## 2023-02-11 MED ORDER — HYDROCODONE-ACETAMINOPHEN 5-325 MG PO TABS: 2.0000 | ORAL_TABLET | ORAL | 0 refills | Status: DC | PRN

## 2023-02-11 MED ORDER — PANTOPRAZOLE SODIUM 40 MG IV SOLR: 40.0000 mg | Freq: Every day | INTRAVENOUS | Status: DC

## 2023-02-11 MED ORDER — LIDOCAINE-EPINEPHRINE 1 %-1:100000 IJ SOLN
INTRAMUSCULAR | Status: AC
  Filled 2023-02-11: qty 1

## 2023-02-11 MED ORDER — SODIUM CHLORIDE 0.9 % IV SOLN
250.0000 mL | INTRAVENOUS | Status: DC
  Administered 2023-02-11: 250 mL via INTRAVENOUS

## 2023-02-11 MED ORDER — HYDROCODONE-ACETAMINOPHEN 5-325 MG PO TABS
2.0000 | ORAL_TABLET | ORAL | Status: DC | PRN
  Administered 2023-02-11: 2 via ORAL
  Filled 2023-02-11: qty 2

## 2023-02-11 MED ORDER — ROCURONIUM BROMIDE 10 MG/ML (PF) SYRINGE
PREFILLED_SYRINGE | INTRAVENOUS | Status: DC | PRN
  Administered 2023-02-11: 60 mg via INTRAVENOUS
  Administered 2023-02-11 (×2): 10 mg via INTRAVENOUS

## 2023-02-11 MED ORDER — PHENYLEPHRINE 80 MCG/ML (10ML) SYRINGE FOR IV PUSH (FOR BLOOD PRESSURE SUPPORT)
PREFILLED_SYRINGE | INTRAVENOUS | Status: AC
  Filled 2023-02-11: qty 10

## 2023-02-11 MED ORDER — VITAMIN C 500 MG PO TABS: 500.0000 mg | ORAL_TABLET | Freq: Every day | ORAL | Status: DC

## 2023-02-11 MED ORDER — OXYCODONE HCL 5 MG PO TABS: 5.0000 mg | ORAL_TABLET | Freq: Once | ORAL | Status: DC | PRN

## 2023-02-11 MED ORDER — FENTANYL CITRATE (PF) 250 MCG/5ML IJ SOLN
INTRAMUSCULAR | Status: DC | PRN
  Administered 2023-02-11 (×2): 50 ug via INTRAVENOUS
  Administered 2023-02-11: 100 ug via INTRAVENOUS

## 2023-02-11 MED ORDER — ONDANSETRON HCL 4 MG/2ML IJ SOLN
INTRAMUSCULAR | Status: DC | PRN
  Administered 2023-02-11: 4 mg via INTRAVENOUS

## 2023-02-11 MED ORDER — THROMBIN (RECOMBINANT) 5000 UNITS EX SOLR: CUTANEOUS | Status: DC | PRN

## 2023-02-11 MED ORDER — TURMERIC 500 MG PO CAPS: 100.0000 mg | ORAL_CAPSULE | Freq: Every day | ORAL | Status: DC | PRN

## 2023-02-11 MED ORDER — MEPERIDINE HCL 25 MG/ML IJ SOLN: 6.2500 mg | INTRAMUSCULAR | Status: DC | PRN

## 2023-02-11 MED ORDER — HYDROMORPHONE HCL 1 MG/ML IJ SOLN
0.2500 mg | INTRAMUSCULAR | Status: DC | PRN
  Administered 2023-02-11: 0.5 mg via INTRAVENOUS

## 2023-02-11 MED ORDER — CEFAZOLIN SODIUM-DEXTROSE 2-4 GM/100ML-% IV SOLN: 2.0000 g | Freq: Three times a day (TID) | INTRAVENOUS | Status: DC

## 2023-02-11 MED ORDER — ORAL CARE MOUTH RINSE: 15.0000 mL | Freq: Once | OROMUCOSAL | Status: AC

## 2023-02-11 MED ORDER — FENTANYL CITRATE (PF) 250 MCG/5ML IJ SOLN
INTRAMUSCULAR | Status: AC
  Filled 2023-02-11: qty 5

## 2023-02-11 MED ORDER — MIDAZOLAM HCL 2 MG/2ML IJ SOLN: 0.5000 mg | Freq: Once | INTRAMUSCULAR | Status: DC | PRN

## 2023-02-11 MED ORDER — PROPOFOL 10 MG/ML IV BOLUS
INTRAVENOUS | Status: DC | PRN
  Administered 2023-02-11: 100 mg via INTRAVENOUS

## 2023-02-11 MED ORDER — SODIUM CHLORIDE 0.9% FLUSH: 3.0000 mL | Freq: Two times a day (BID) | INTRAVENOUS | Status: DC

## 2023-02-11 MED ORDER — PHENYLEPHRINE HCL-NACL 20-0.9 MG/250ML-% IV SOLN
INTRAVENOUS | Status: DC | PRN
  Administered 2023-02-11: 40 ug/min via INTRAVENOUS

## 2023-02-11 MED ORDER — HYDROMORPHONE HCL 1 MG/ML IJ SOLN: 0.5000 mg | INTRAMUSCULAR | Status: DC | PRN

## 2023-02-11 MED ORDER — SPIRONOLACTONE 25 MG PO TABS: 25.0000 mg | ORAL_TABLET | Freq: Every day | ORAL | Status: DC

## 2023-02-11 MED ORDER — MIDAZOLAM HCL 2 MG/2ML IJ SOLN
INTRAMUSCULAR | Status: AC
  Filled 2023-02-11: qty 2

## 2023-02-11 SURGICAL SUPPLY — 50 items
ADH SKN CLS APL DERMABOND .7 (GAUZE/BANDAGES/DRESSINGS) ×1
APL SKNCLS STERI-STRIP NONHPOA (GAUZE/BANDAGES/DRESSINGS) ×1
BAG COUNTER SPONGE SURGICOUNT (BAG) ×1 IMPLANT
BAG SPNG CNTER NS LX DISP (BAG) ×1
BAND INSRT 18 STRL LF DISP RB (MISCELLANEOUS) ×2
BAND RUBBER #18 3X1/16 STRL (MISCELLANEOUS) ×2 IMPLANT
BENZOIN TINCTURE PRP APPL 2/3 (GAUZE/BANDAGES/DRESSINGS) ×1 IMPLANT
BLADE SURG 11 STRL SS (BLADE) ×1 IMPLANT
BUR CUTTER 7.0 ROUND (BURR) ×1 IMPLANT
BUR MATCHSTICK NEURO 3.0 LAGG (BURR) ×1 IMPLANT
CANISTER SUCT 3000ML PPV (MISCELLANEOUS) ×1 IMPLANT
DERMABOND ADVANCED .7 DNX12 (GAUZE/BANDAGES/DRESSINGS) ×1 IMPLANT
DRAPE HALF SHEET 40X57 (DRAPES) IMPLANT
DRAPE LAPAROTOMY 100X72X124 (DRAPES) ×1 IMPLANT
DRAPE MICROSCOPE SLANT 54X150 (MISCELLANEOUS) ×1 IMPLANT
DRAPE SURG 17X23 STRL (DRAPES) ×1 IMPLANT
DRSG OPSITE POSTOP 4X6 (GAUZE/BANDAGES/DRESSINGS) IMPLANT
DURAPREP 26ML APPLICATOR (WOUND CARE) ×1 IMPLANT
ELECT REM PT RETURN 9FT ADLT (ELECTROSURGICAL) ×1
ELECTRODE REM PT RTRN 9FT ADLT (ELECTROSURGICAL) ×1 IMPLANT
GAUZE 4X4 16PLY ~~LOC~~+RFID DBL (SPONGE) IMPLANT
GAUZE SPONGE 4X4 12PLY STRL (GAUZE/BANDAGES/DRESSINGS) ×1 IMPLANT
GLOVE BIO SURGEON STRL SZ7 (GLOVE) IMPLANT
GLOVE BIO SURGEON STRL SZ8 (GLOVE) ×1 IMPLANT
GLOVE BIOGEL PI IND STRL 7.0 (GLOVE) IMPLANT
GLOVE INDICATOR 8.5 STRL (GLOVE) ×2 IMPLANT
GOWN STRL REUS W/ TWL LRG LVL3 (GOWN DISPOSABLE) ×1 IMPLANT
GOWN STRL REUS W/ TWL XL LVL3 (GOWN DISPOSABLE) ×2 IMPLANT
GOWN STRL REUS W/TWL 2XL LVL3 (GOWN DISPOSABLE) IMPLANT
GOWN STRL REUS W/TWL LRG LVL3 (GOWN DISPOSABLE) ×1
GOWN STRL REUS W/TWL XL LVL3 (GOWN DISPOSABLE) ×3
KIT BASIN OR (CUSTOM PROCEDURE TRAY) ×1 IMPLANT
KIT TURNOVER KIT B (KITS) ×1 IMPLANT
NDL SPNL 22GX3.5 QUINCKE BK (NEEDLE) ×1 IMPLANT
NEEDLE HYPO 22GX1.5 SAFETY (NEEDLE) ×1 IMPLANT
NEEDLE SPNL 22GX3.5 QUINCKE BK (NEEDLE) ×1 IMPLANT
NS IRRIG 1000ML POUR BTL (IV SOLUTION) ×1 IMPLANT
PACK LAMINECTOMY NEURO (CUSTOM PROCEDURE TRAY) ×1 IMPLANT
SOL ELECTROSURG ANTI STICK (MISCELLANEOUS) ×1
SOLUTION ELECTROSURG ANTI STCK (MISCELLANEOUS) ×1 IMPLANT
SPONGE SURGIFOAM ABS GEL SZ50 (HEMOSTASIS) ×1 IMPLANT
STRIP CLOSURE SKIN 1/2X4 (GAUZE/BANDAGES/DRESSINGS) ×1 IMPLANT
SUT VIC AB 0 CT1 18XCR BRD8 (SUTURE) ×1 IMPLANT
SUT VIC AB 0 CT1 8-18 (SUTURE) ×1
SUT VIC AB 2-0 CT1 18 (SUTURE) ×1 IMPLANT
SUT VIC AB 4-0 PS2 27 (SUTURE) IMPLANT
SUT VICRYL 4-0 PS2 18IN ABS (SUTURE) ×1 IMPLANT
TOWEL GREEN STERILE (TOWEL DISPOSABLE) ×1 IMPLANT
TOWEL GREEN STERILE FF (TOWEL DISPOSABLE) ×1 IMPLANT
WATER STERILE IRR 1000ML POUR (IV SOLUTION) ×1 IMPLANT

## 2023-02-11 NOTE — Anesthesia Procedure Notes (Signed)
Procedure Name: Intubation Date/Time: 02/11/2023 3:01 PM  Performed by: Ester Rink, CRNAPre-anesthesia Checklist: Patient identified, Emergency Drugs available, Suction available and Patient being monitored Patient Re-evaluated:Patient Re-evaluated prior to induction Oxygen Delivery Method: Circle system utilized Preoxygenation: Pre-oxygenation with 100% oxygen Induction Type: IV induction Ventilation: Mask ventilation without difficulty Laryngoscope Size: Mac and 4 Grade View: Grade I Tube type: Oral Tube size: 7.5 mm Number of attempts: 1 Airway Equipment and Method: Stylet and Oral airway Placement Confirmation: ETT inserted through vocal cords under direct vision, positive ETCO2 and breath sounds checked- equal and bilateral Secured at: 23 cm Tube secured with: Tape Dental Injury: Teeth and Oropharynx as per pre-operative assessment

## 2023-02-11 NOTE — Discharge Instructions (Signed)
Wound Care  Keep the incision clean and dry remove the outer dressing in 3 days, leave the Steri-Strips intact. Wrap with Saran wrap for showers only Do not put any creams, lotions, or ointments on incision. Leave steri-strips on back.  They will fall off by themselves.  Activity Walk each and every day, increasing distance each day. No lifting greater than 5 lbs.  No lifting no bending no twisting no driving or riding a car unless coming back and forth to see me. . Diet Resume your normal diet.   Return to Work Will be discussed at you follow up appointment.  Call Your Doctor If Any of These Occur Redness, drainage, or swelling at the wound.  Temperature greater than 101 degrees. Severe pain not relieved by pain medication. Incision starts to come apart. Follow Up Appt Call  [phone])  for problems.  If you have any hardware placed in your spine, you will need an x-ray before your appointment.   

## 2023-02-11 NOTE — Progress Notes (Signed)
Discharged instructions/education/AVS/Rx given to patient with wife at bedside and they both verbalized understanding. Patient voiding and emptying bladder well. Pain is minimal per patient, Dressing and incision site CDI. Patient ambulated and did well in the hallway with supervision. Ate dinner with no complaints. Patient ready for discharge, awaiting for his transport.

## 2023-02-11 NOTE — Anesthesia Postprocedure Evaluation (Signed)
Anesthesia Post Note  Patient: Troy [name]  Procedure(s) Performed: Left Lumbar Four-Five Microdiscectomy (Left: Spine Lumbar)     Patient location during evaluation: PACU Anesthesia Type: General Level of consciousness: awake and alert, patient cooperative and oriented Pain management: pain level controlled Vital Signs Assessment: post-procedure vital signs reviewed and stable Respiratory status: spontaneous breathing, nonlabored ventilation, respiratory function stable and patient connected to nasal cannula oxygen Cardiovascular status: blood pressure returned to baseline and stable Postop Assessment: no apparent nausea or vomiting and adequate PO intake Anesthetic complications: no   There were no known notable events for this encounter.  Last Vitals:  Vitals:   02/11/23 1630 02/11/23 1645  BP: (!) 135/95 (!) 127/90  Pulse: 87 83  Resp: 14 16  Temp: 36.8 C   SpO2: 94% 96%    Last Pain:  Vitals:   02/11/23 1645  TempSrc:   PainSc: 5                  Kissie Ziolkowski,E. Vayden Weinand

## 2023-02-11 NOTE — Discharge Summary (Signed)
  Physician Discharge Summary  Patient ID: Troy [name] MRN: [id number] DOB/AGE: 18-Jul-1951 72 y.o. Estimated body mass index is 32.08 kg/m as calculated from the following:   Height as of this encounter: 5\' 11"  (1.803 m).   Weight as of this encounter: 104.3 kg.   Admit date: 02/11/2023 Discharge date: 02/11/2023  Admission Diagnoses: Herniated nucleus pulposus L4-5 left  Discharge Diagnoses: Same Principal Problem:   HNP (herniated nucleus pulposus), lumbar   Discharged Condition: good  Hospital Course: Patient was admitted to hospital underwent laminectomy microdiscectomy L4-5 on the left postoperative patient did very well covering the floor on the floor was ambulating voiding spontaneously tolerating regular diet and stable for discharge home.  Consults: Significant Diagnostic Studies: Treatments: Limited to microdiscectomy L4-5 left Discharge Exam: Blood pressure (!) 142/96, pulse 71, temperature 97.9 F (36.6 C), temperature source Oral, resp. rate 20, height 5\' 11"  (1.803 m), weight 104.3 kg, SpO2 100 %. Strength 5 out of 5 wound clean dry and intact  Disposition: Home   Allergies as of 02/11/2023       Reactions   Gluten Meal Other (See Comments)   REACTION: Celiac disease (severe stomach pain)   Iodinated Contrast Media Anaphylaxis, Other (See Comments)   V-Fib arrest during cardiac cath suspected d/t contrast dye in 2016 Cardiac arrest   Metrizamide Other (See Comments)   V-Fib arrest during cardiac cath suspected d/t contrast dye in 2016   Tizanidine Anxiety   Depression   [place] Other (See Comments)   REACTION: Celiac disease (severe stomach pain)   Morphine And Related Other (See Comments)   REACTION: "REALLY BAD STOMACH PAIN"   Rosuvastatin    Myalgias on 40mg  daily   Apple Juice Diarrhea   Tizanidine Hcl Anxiety   Depression   Wheat Other (See Comments)   REACTION: Celiac disease (severe stomach pain)        Medication List     TAKE these  medications    ALPRAZolam 1 MG tablet Commonly known as: XANAX Take 1 mg by mouth at bedtime.   cyclobenzaprine 10 MG tablet Commonly known as: FLEXERIL Take 1 tablet (10 mg total) by mouth 3 (three) times daily as needed for muscle spasms.   HYDROcodone-acetaminophen 5-325 MG tablet Commonly known as: NORCO/VICODIN Take 2 tablets by mouth every 4 (four) hours as needed for severe pain ((score 7 to 10)).   losartan 100 MG tablet Commonly known as: COZAAR TAKE 1 TABLET(100 MG) BY MOUTH DAILY   metoprolol tartrate 100 MG tablet Commonly known as: LOPRESSOR TAKE 1 TABLET(100 MG) BY MOUTH TWICE DAILY   Repatha SureClick [id number] MG/ML Soaj Generic drug: Evolocumab ADMINISTER 1 ML UNDER THE SKIN EVERY 14 DAYS   spironolactone 25 MG tablet Commonly known as: ALDACTONE Take 25 mg by mouth daily.   TURMERIC PO Take 100 mg by mouth daily as needed (Muscle and joint pain).   VITAMIN C PO Take 400 mg by mouth daily.   warfarin 5 MG tablet Commonly known as: COUMADIN TAKE AS DIRECTED BY ANTICOAGULATION CLINIC What changed:  how much to take how to take this when to take this additional instructions         Signed: Elaina Hoops 02/11/2023, 6:03 PM

## 2023-02-11 NOTE — Transfer of Care (Signed)
Immediate Anesthesia Transfer of Care Note  Patient: Troy [name]  Procedure(s) Performed: Left Lumbar Four-Five Microdiscectomy (Left: Spine Lumbar)  Patient Location: PACU  Anesthesia Type:General  Level of Consciousness: awake, alert , and oriented  Airway & Oxygen Therapy: Patient Spontanous Breathing  Post-op Assessment: Report given to RN and Post -op Vital signs reviewed and stable  Post vital signs: Reviewed and stable  Last Vitals:  Vitals Value Taken Time  BP 137/94 02/11/23 1627  Temp    Pulse 82 02/11/23 1629  Resp 12 02/11/23 1629  SpO2 94 % 02/11/23 1629  Vitals shown include unvalidated device data.  Last Pain:  Vitals:   02/11/23 1257  TempSrc:   PainSc: 7       Patients Stated Pain Goal: 2 ([id number])  Complications: There were no known notable events for this encounter.

## 2023-02-11 NOTE — Evaluation (Addendum)
Physical Therapy Evaluation Patient Details Name: Troy [name] MRN: [id number] DOB: 03-Feb-1951 Today's Date: 02/11/2023  History of Present Illness  Pt is a 72 y.o. male who presented 02/11/23 for lumbar laminectomy L4-5 on the left with microscopic discectomy and microscopic foraminotomies the L5 nerve root. PMH: aortic regurgitation, arthritis, ascending aorta dilatation, CAD, dysrhythmia, dyslipidemia, HTN, mitral regurgitation, OSA on CPAP, afib, pulmonary nodule, ventricular fibrillation   Clinical Impression  Pt presents with condition above and deficits mentioned below, see PT Problem List. PTA, he was independent without an AD, intermittently using a cane as needed. Pt lives with his significant other in a 1-level house with 3 STE. Pt endorses a hx of falls every few months. Currently, pt displays deficits in L ankle dorsiflexion strength along with L dorsal foot and big toe sensation, which the pt reported was present prior to the surgery and likely could be correlated to the L4-5 deficits that needed surgical repair when considering th myotome and dermatome distribution pattern for L4 and L5. Pt reports his sensation is already noted to be improving. Pt was able to perform all functional mobility without LOB at a min guard assist level without an AD. He does display deficits in balance that do place him at risk for falls though. He verbalized understanding of this. Educated pt and significant other to use cane and have guarding outside on uneven surfaces, supervise pt initially at home, back precautions, changing positions/mobilizing frequently, feet sequencing on stairs, and car transfers. Discussed potentially following up with OPPT for balance training after pt follows up with his MD in a couple weeks. Will defer this decision to the pt and the MD at that time. Will continue to follow acutely.     Recommendations for follow up therapy are one component of a multi-disciplinary discharge  planning process, led by the attending physician.  Recommendations may be updated based on patient status, additional functional criteria and insurance authorization.  Follow Up Recommendations       Assistance Recommended at Discharge PRN  Patient can return home with the following  A little help with bathing/dressing/bathroom;Assistance with cooking/housework;Assist for transportation;Help with stairs or ramp for entrance    Equipment Recommendations None recommended by PT  Recommendations for Other Services       Functional Status Assessment Patient has had a recent decline in their functional status and demonstrates the ability to make significant improvements in function in a reasonable and predictable amount of time.     Precautions / Restrictions Precautions Precautions: Fall;Back Precaution Booklet Issued: Yes (comment) Precaution Comments: reviewed precautions Required Braces or Orthoses:  (no brace needed orders) Restrictions Weight Bearing Restrictions: No      Mobility  Bed Mobility Overal bed mobility: Needs Assistance Bed Mobility: Rolling, Sidelying to Sit, Sit to Supine Rolling: Min guard Sidelying to sit: Min guard   Sit to supine: Min guard   General bed mobility comments: Cues provided for log roll technique and transitioning sidelying to sit R EOB without bed rails with bed flat, extra time and min guard for safety. Pt transitioning sit to supine with min guard assist. Education provided to descend onto side/elbow before rolling back to supine in able    Transfers Overall transfer level: Needs assistance Equipment used: None Transfers: Sit to/from Stand Sit to Stand: Min guard, From elevated surface           General transfer comment: Minguard assist for safety with transfer to stand from elevated EOB, no LOB  Ambulation/Gait Ambulation/Gait assistance: Min guard Gait Distance (Feet): 500 Feet Assistive device: None Gait  Pattern/deviations: Step-through pattern, Decreased stride length, Wide base of support, Drifts right/left Gait velocity: reduced Gait velocity interpretation: 1.31 - 2.62 ft/sec, indicative of limited community ambulator   General Gait Details: Pt with slow, wide steps and stiff movement with decreased knee flexion during swing bil initially. As distance progressed, pt's gait became more fluid but he maintained a wide BOS, which he reports is baseline. No LOB, min guard for safety, mild lateral drifting noted intermittently.  Stairs Stairs: Yes Stairs assistance: Min guard Stair Management: One rail Right, One rail Left, Step to pattern, Forwards Number of Stairs: 4 General stair comments: Ascends with R rail and descends with L to simulate home set-up. Step-to pattern with pt being cued to ascend leading with his R foot and descend leading wiht his L foot. No LOB, min guard for safety  Wheelchair Mobility    Modified Rankin (Stroke Patients Only)       Balance Overall balance assessment: Mild deficits observed, not formally tested                                           Pertinent Vitals/Pain Pain Assessment Pain Assessment: 0-10 Pain Score: 6  Pain Location: back incision site Pain Descriptors / Indicators: Discomfort, Grimacing, Operative site guarding Pain Intervention(s): Limited activity within patient's tolerance, Monitored during session, Repositioned    Home Living Family/patient expects to be discharged to:: Private residence Living Arrangements: Spouse/significant other Available Help at Discharge: Family;Available 24 hours/day Type of Home: House Home Access: Stairs to enter Entrance Stairs-Rails: Right (ascending) Entrance Stairs-Number of Steps: 3   Home Layout: One level Home Equipment: Cane - single point;Grab bars - tub/shower      Prior Function Prior Level of Function : Independent/Modified Independent;Driving;History of Falls (last  six months)             Mobility Comments: SPC intermittently, but primarily no AD; hx of falling  every couple months per pt       Hand Dominance   Dominant Hand: Right    Extremity/Trunk Assessment   Upper Extremity Assessment Upper Extremity Assessment: Defer to OT evaluation;Overall [facility] for tasks assessed    Lower Extremity Assessment Lower Extremity Assessment: LLE deficits/detail LLE Deficits / Details: MMT scores of 5 grossly except with ankle dorsiflexion with MMT score of 4 (5 on R); diminished sensation dorsal aspect of foot and no sensation dorsal aspect of big toe; hx of peripheral neuropathy bil affecting plantar aspects of feet LLE Sensation: history of peripheral neuropathy;decreased light touch    Cervical / Trunk Assessment Cervical / Trunk Assessment: Back Surgery  Communication   Communication: No difficulties  Cognition Arousal/Alertness: Awake/alert Behavior During Therapy: WFL for tasks assessed/performed Overall Cognitive Status: Within Functional Limits for tasks assessed                                          General Comments General comments (skin integrity, edema, etc.): educated pt and significant other to use cane and have guarding outside on uneven surfaces, supervise pt initially at home, back precautions, changing positions/mobilizing frequently, and car transfers    Exercises     Assessment/Plan    PT Assessment Patient needs  continued PT services  PT Problem List Decreased strength;Decreased activity tolerance;Decreased balance;Decreased mobility;Decreased knowledge of precautions;Impaired sensation;Pain       PT Treatment Interventions DME instruction;Gait training;Stair training;Functional mobility training;Therapeutic activities;Therapeutic exercise;Balance training;Neuromuscular re-education;Patient/family education    PT Goals (Current goals can be found in the Care Plan section)  Acute Rehab PT  Goals Patient Stated Goal: to go home and continue to improve PT Goal Formulation: With patient/family Time For Goal Achievement: 02/25/23 Potential to Achieve Goals: Good    Frequency Min 5X/week     Co-evaluation               AM-PAC PT "6 Clicks" Mobility  Outcome Measure Help needed turning from your back to your side while in a flat bed without using bedrails?: A Little Help needed moving from lying on your back to sitting on the side of a flat bed without using bedrails?: A Little Help needed moving to and from a bed to a chair (including a wheelchair)?: A Little Help needed standing up from a chair using your arms (e.g., wheelchair or bedside chair)?: A Little Help needed to walk in hospital room?: A Little Help needed climbing 3-5 steps with a railing? : A Little 6 Click Score: 18    End of Session Equipment Utilized During Treatment: Gait belt Activity Tolerance: Patient tolerated treatment well Patient left: in bed;with call bell/phone within reach;with SCD's reapplied;with family/visitor present Nurse Communication: Mobility status PT Visit Diagnosis: Unsteadiness on feet (R26.81);Other abnormalities of gait and mobility (R26.89);Muscle weakness (generalized) (M62.81);Difficulty in walking, not elsewhere classified (R26.2);Other symptoms and signs involving the nervous system (R29.898);Pain Pain - part of body:  (back)    Time: [id number] PT Time Calculation (min) (ACUTE ONLY): 23 min   Charges:   PT Evaluation $PT Eval Low Complexity: 1 Low PT Treatments $Therapeutic Activity: 8-22 mins        Troy [name], PT, DPT Acute Rehabilitation Services  Office: [phone]   Orvan Falconer 02/11/2023, 6:42 PM

## 2023-02-11 NOTE — Op Note (Signed)
Preoperative diagnosis: Herniated nucleus pulposus L4-5 left.  Postoperative diagnosis: Same.  Procedure: Lumbar laminectomy L4-5 on the left with microscopic discectomy and microscopic foraminotomies the L5 nerve root.  Surgeon: Kary Kos.  Assistant: Ashok Pall.  Anesthesia: General.  EBL: Minimal.  HPI: 72 year old gentleman with back and left L5 radiculopathy workup revealed disc herniation L4-5 in the left displacing the left L5 nerve root.  Due to patient's progression of clinical syndrome imaging findings of a conservative treatment I recommended laminectomy microdiscectomy at L4-5 on the left.  I extensively reviewed the risks and benefits of the operation with the patient as well as perioperative course expectations of outcome and alternatives to surgery and he understood and agreed to proceed forward.  Operative procedure: Patient was brought into the OR was Duson general anesthesia positioned prone the Wilson frame his back was prepped and draped in routine sterile fashion.  Preoperative x-ray localized the appropriate level so after infiltration of 10 cc lidocaine with epi midline incision was made and Bovie electrocautery was used take down the subcutaneous tissue and subperiosteal dissection was carried left on the lamina of L4 and L5.  Interoperative x-ray confirmed application appropriate levels then a high-speed drill was used to drill down the inferior last aspect of the lamina of L4 medial facet complex and superior aspect of lamina of L5 laminotomy was begun with a 3 mm Kerrison punch ligament flavum is identified and removed in piecemeal fashion exposing the thecal sac and under microscopic illumination under biting medial facet complex and identification of the L5 pedicle.  The L5 foramen was then unroofed and the L5 nerve root was identified.  This was then mobilized over a large disc herniation still partially contained with the ligament.  So I reflected the L5 nerve root  medially with the Derica nerve root retractor and sized the annulus and several large free fragments were immediately expressed.  There was also an inferior fragment medial to the pedicle that was teased off of the L5 nerve root as well.  At the end discectomy there is no further stenosis on the thecal sac the L5 foramen was widely patent the L5 nerve root was easily mobilizable.  The wound was then copiously irrigated meticulous hemostasis was maintained Gelfoam was overlaid top of the dura and the muscle and fascia reapproximated layers with active Vicryl and skin was closed with running 4 subcuticular.  Dermabond benzoin Steri-Strips and a sterile dressing was applied patient recovery in stable condition.  At the end of the case all needle count sponge counts were correct.

## 2023-02-11 NOTE — Plan of Care (Signed)

## 2023-02-11 NOTE — H&P (Addendum)
Troy [name] is an 72 y.o. male.   Chief Complaint: Back and left leg pain HPI: 72 year old gentleman with back and left leg pain rating down L5 distribution workup revealed severe lumbar spondylosis disc herniation L4-5 displacing the left L5 nerve root.  As this correlated with his clinical syndrome he failed all forms of conservative treatment I recommended lumbar laminectomy microdiscectomy at L4-5 on the left.  I extensively reviewed the risks and benefits of the operation with him as well as perioperative course expectations of outcome and alternatives of surgery and he understood and agreed to proceed forward.  Past Medical History:  Diagnosis Date   Anginal pain    Aortic regurgitation    Mild by echo 07/2022   Arthritis    "knees" (01/02/2017)   Ascending aorta dilatation    79mm by CT 2017 and 57mm by CT 2019, 42mm by echo 07/2020, 7mm by echo 2022, 41 mm by echo 2023   CAD (coronary artery disease)    a. s/p PCI in 2010 in [place]. b. Unstable angina/LHC [id number] complicated by V fib arrest after contrast injection. Cath 06/19/2015 s/p DES to mid LAD and RPDA. c. 11/2016: chest pain/ abnormal nuclear stress test prompting cath 11/13/16 showing 80% ostial diag, 60% mid RCA, no acute lesions, medical therapy recommended.   Dyslipidemia    Dysrhythmia    A. Fib   Essential hypertension    Lung nodule < 6cm on CT 06/16/2015   Right lung   Mitral regurgitation    mild to moderate by echo 07/2020   Mitral regurgitation    Mild by echo 07/2022   OSA on CPAP    Persistent atrial fibrillation    a. 06/2015 noted to be in new a-fib when arrived with unstable angina. Converted to NSR after being shocked in the cath lab for vfib;  b. 06/2015 Eliquis initiated as PAF noted on event monitor. c. Recurrent atrial fib?flutter 10/2016, issues with recurrent AF requiring multiple DCCVs in 02/2017. Failed sotalol, not candidate for Tikosyn due to cost/QTC, Multaq not felt likely strong enough.    Pulmonary nodule 07/2015   a. 1.5 x 1.2 cm smoothly marginated nodule in the central aspect of the right lower lobe with recommendation correlation with nonemergent PET-CT to exclude a neoplasm , stable by CT 07/2016   Severe left ventricular hypertrophy    Ventricular fibrillation    a. occured during cath 06/18/2015.    Past Surgical History:  Procedure Laterality Date   CARDIAC CATHETERIZATION N/A 06/18/2015   Procedure: Left Heart Cath and Coronary Angiography;  Surgeon: Burnell Blanks, MD;  Location: [place];  Service: Cardiovascular;  Laterality: N/A;   CARDIAC CATHETERIZATION N/A 06/19/2015   Procedure: Coronary Stent Intervention;  Surgeon: Peter M Martinique, MD;  Location: [place] [facility];  Service: Cardiovascular;  Laterality: N/A;   CARDIAC CATHETERIZATION N/A 11/13/2016   Procedure: Left Heart Cath and Coronary Angiography;  Surgeon: Lorretta Harp, MD;  Location: [facility];  Service: Cardiovascular;  Laterality: N/A;   CARDIOVERSION N/A 11/17/2016   Procedure: CARDIOVERSION;  Surgeon: Larey Dresser, MD;  Location: [place];  Service: Cardiovascular;  Laterality: N/A;   CARDIOVERSION N/A 01/03/2017   Procedure: CARDIOVERSION;  Surgeon: Lelon Perla, MD;  Location: [place];  Service: Cardiovascular;  Laterality: N/A;   CARDIOVERSION N/A 02/05/2017   Procedure: Cardioversion;  Surgeon: Evans Lance, MD;  Location: [facility];  Service: Cardiovascular;  Laterality: N/A;  CARDIOVERSION N/A 02/20/2017   Procedure: Cardioversion;  Surgeon: Evans Lance, MD;  Location: [facility] [facility];  Service: Cardiovascular;  Laterality: N/A;   CARTILAGE SURGERY Bilateral    "thumbs"   CATARACT EXTRACTION W/ INTRAOCULAR LENS  IMPLANT, BILATERAL Bilateral    COLONOSCOPY     CORONARY ANGIOPLASTY WITH STENT PLACEMENT  ~ 2007   JOINT REPLACEMENT     KNEE ARTHROSCOPY Right    RETINAL DETACHMENT SURGERY Bilateral    "laser thing"   TEE WITHOUT  CARDIOVERSION N/A 11/17/2016   Procedure: TRANSESOPHAGEAL ECHOCARDIOGRAM (TEE);  Surgeon: Larey Dresser, MD;  Location: [place];  Service: Cardiovascular;  Laterality: N/A;   TOTAL KNEE ARTHROPLASTY Left 2000s   TOTAL KNEE ARTHROPLASTY Right 02/08/2018   Procedure: RIGHT TOTAL KNEE ARTHROPLASTY;  Surgeon: Gaynelle Arabian, MD;  Location: [facility];  Service: Orthopedics;  Laterality: Right;  with block    Family History  Problem Relation Age of Onset   Stroke Mother    Emphysema Mother    Lung cancer Mother    Heart attack Father    Drug abuse Father    Heart attack Maternal Grandfather    Heart attack Paternal Grandfather    Diabetes Sister    Social History:  reports that he has never smoked. He has never used smokeless tobacco. He reports that he does not currently use alcohol after a past usage of about 2.0 standard drinks of alcohol per week. He reports that he does not use drugs.  Allergies:  Allergies  Allergen Reactions   Gluten Meal Other (See Comments)    REACTION: Celiac disease (severe stomach pain)     Iodinated Contrast Media Anaphylaxis and Other (See Comments)    V-Fib arrest during cardiac cath suspected d/t contrast dye in 2016 Cardiac arrest   Metrizamide Other (See Comments)    V-Fib arrest during cardiac cath suspected d/t contrast dye in 2016   Tizanidine Anxiety    Depression   [place] Other (See Comments)    REACTION: Celiac disease (severe stomach pain)   Morphine And Related Other (See Comments)    REACTION: "REALLY BAD STOMACH PAIN"   Rosuvastatin     Myalgias on 40mg  daily   Apple Juice Diarrhea   Tizanidine Hcl Anxiety    Depression   Wheat Other (See Comments)    REACTION: Celiac disease (severe stomach pain)    Medications Prior to Admission  Medication Sig Dispense Refill   ALPRAZolam (XANAX) 1 MG tablet Take 1 mg by mouth at bedtime.     Ascorbic Acid (VITAMIN C PO) Take 400 mg by mouth daily.     losartan (COZAAR) 100 MG tablet TAKE  1 TABLET(100 MG) BY MOUTH DAILY 90 tablet 3   metoprolol tartrate (LOPRESSOR) 100 MG tablet TAKE 1 TABLET(100 MG) BY MOUTH TWICE DAILY 180 tablet 3   spironolactone (ALDACTONE) 25 MG tablet Take 25 mg by mouth daily.     TURMERIC PO Take 100 mg by mouth daily as needed (Muscle and joint pain).     warfarin (COUMADIN) 5 MG tablet TAKE AS DIRECTED BY ANTICOAGULATION CLINIC (Patient taking differently: Take 7.5-10 mg by mouth See admin instructions. Take 10 mg on Sunday, Tuesday and Thursday, Take 7.5 mg on Monday, Wednesday , Friday and Saturday) 150 tablet 1   REPATHA SURECLICK [id number] MG/ML SOAJ ADMINISTER 1 ML UNDER THE SKIN EVERY 14 DAYS (Patient not taking: Reported on 02/04/2023) 2 mL 11    Results for orders placed or performed  during the hospital encounter of 02/11/23 (from the past 48 hour(s))  PT-INR Day of Surgery per protocol     Status: None   Collection Time: 02/11/23  1:16 PM  Result Value Ref Range   Prothrombin Time 14.2 11.4 - 15.2 seconds   INR 1.1 0.8 - 1.2    Comment: (NOTE) INR goal varies based on device and disease states. Performed at [facility], [place] [address] [place],  [address]    No results found.  Review of Systems  Musculoskeletal:  Positive for back pain.  Neurological:  Positive for numbness.    Blood pressure (!) 133/98, pulse 82, temperature 97.8 F (36.6 C), temperature source Oral, resp. rate 20, height 5\' 11"  (1.803 m), weight 104.3 kg, SpO2 95 %. Physical Exam HENT:     Head: Normocephalic.     Right Ear: Tympanic membrane normal.     Nose: Nose normal.     Mouth/Throat:     Mouth: Mucous membranes are moist.  Eyes:     Pupils: Pupils are equal, round, and reactive to light.  Cardiovascular:     Rate and Rhythm: Normal rate.  Pulmonary:     Effort: Pulmonary effort is normal.  Abdominal:     General: Abdomen is flat.  Musculoskeletal:     Cervical back: Normal range of motion.  Neurological:     Mental Status: He is alert.      Comments: Patient is awake and alert strength is 5-5 iliopsoas, quads, hamstrings, gastroc, tibialis, and EHL.      Assessment/Plan 72 year old presents for left-sided L4-5 microdiscectomy  Elaina Hoops, MD 02/11/2023, 2:19 PM  Addendum: EHL in the left was actually 2 out of 5 preoperatively

## 2023-02-12 ENCOUNTER — Encounter (HOSPITAL_COMMUNITY): Payer: Self-pay | Admitting: Neurosurgery

## 2023-02-12 MED FILL — Thrombin For Soln 5000 Unit: CUTANEOUS | Qty: 2 | Status: AC

## 2023-04-14 DIAGNOSIS — M5442 Lumbago with sciatica, left side: Secondary | ICD-10-CM | POA: Diagnosis not present

## 2023-04-22 DIAGNOSIS — M5442 Lumbago with sciatica, left side: Secondary | ICD-10-CM | POA: Diagnosis not present

## 2023-04-24 DIAGNOSIS — M5442 Lumbago with sciatica, left side: Secondary | ICD-10-CM | POA: Diagnosis not present

## 2023-04-28 DIAGNOSIS — M5442 Lumbago with sciatica, left side: Secondary | ICD-10-CM | POA: Diagnosis not present

## 2023-05-08 DIAGNOSIS — M5442 Lumbago with sciatica, left side: Secondary | ICD-10-CM | POA: Diagnosis not present

## 2023-05-13 DIAGNOSIS — M5442 Lumbago with sciatica, left side: Secondary | ICD-10-CM | POA: Diagnosis not present

## 2023-05-19 DIAGNOSIS — M5442 Lumbago with sciatica, left side: Secondary | ICD-10-CM | POA: Diagnosis not present

## 2023-06-10 DIAGNOSIS — I4891 Unspecified atrial fibrillation: Secondary | ICD-10-CM | POA: Diagnosis not present

## 2023-06-10 DIAGNOSIS — Z7901 Long term (current) use of anticoagulants: Secondary | ICD-10-CM | POA: Diagnosis not present

## 2023-06-10 DIAGNOSIS — D6869 Other thrombophilia: Secondary | ICD-10-CM | POA: Diagnosis not present

## 2023-07-08 DIAGNOSIS — Z7901 Long term (current) use of anticoagulants: Secondary | ICD-10-CM | POA: Diagnosis not present

## 2023-07-08 DIAGNOSIS — I4891 Unspecified atrial fibrillation: Secondary | ICD-10-CM | POA: Diagnosis not present

## 2023-07-08 DIAGNOSIS — D6869 Other thrombophilia: Secondary | ICD-10-CM | POA: Diagnosis not present

## 2023-07-23 DIAGNOSIS — B009 Herpesviral infection, unspecified: Secondary | ICD-10-CM | POA: Diagnosis not present

## 2023-07-23 DIAGNOSIS — Z1389 Encounter for screening for other disorder: Secondary | ICD-10-CM | POA: Diagnosis not present

## 2023-07-23 DIAGNOSIS — Z202 Contact with and (suspected) exposure to infections with a predominantly sexual mode of transmission: Secondary | ICD-10-CM | POA: Diagnosis not present

## 2023-08-06 DIAGNOSIS — D696 Thrombocytopenia, unspecified: Secondary | ICD-10-CM | POA: Diagnosis not present

## 2023-08-06 DIAGNOSIS — I480 Paroxysmal atrial fibrillation: Secondary | ICD-10-CM | POA: Diagnosis not present

## 2023-08-18 ENCOUNTER — Other Ambulatory Visit: Payer: Self-pay | Admitting: Thoracic Surgery (Cardiothoracic Vascular Surgery)

## 2023-08-18 DIAGNOSIS — I77810 Thoracic aortic ectasia: Secondary | ICD-10-CM

## 2023-08-28 DIAGNOSIS — I480 Paroxysmal atrial fibrillation: Secondary | ICD-10-CM | POA: Diagnosis not present

## 2023-08-28 DIAGNOSIS — Z7901 Long term (current) use of anticoagulants: Secondary | ICD-10-CM | POA: Diagnosis not present

## 2023-08-28 DIAGNOSIS — I10 Essential (primary) hypertension: Secondary | ICD-10-CM | POA: Diagnosis not present

## 2023-08-28 DIAGNOSIS — N529 Male erectile dysfunction, unspecified: Secondary | ICD-10-CM | POA: Diagnosis not present

## 2023-09-08 ENCOUNTER — Other Ambulatory Visit: Payer: Self-pay | Admitting: Cardiology

## 2023-09-14 ENCOUNTER — Encounter: Payer: Self-pay | Admitting: Nurse Practitioner

## 2023-09-14 ENCOUNTER — Ambulatory Visit: Payer: Medicare Other | Attending: Nurse Practitioner | Admitting: Nurse Practitioner

## 2023-09-14 VITALS — BP 130/70 | HR 85 | Ht 71.0 in | Wt 229.0 lb

## 2023-09-14 DIAGNOSIS — G4733 Obstructive sleep apnea (adult) (pediatric): Secondary | ICD-10-CM

## 2023-09-14 DIAGNOSIS — I351 Nonrheumatic aortic (valve) insufficiency: Secondary | ICD-10-CM

## 2023-09-14 DIAGNOSIS — I77810 Thoracic aortic ectasia: Secondary | ICD-10-CM

## 2023-09-14 DIAGNOSIS — I4821 Permanent atrial fibrillation: Secondary | ICD-10-CM

## 2023-09-14 DIAGNOSIS — I10 Essential (primary) hypertension: Secondary | ICD-10-CM

## 2023-09-14 DIAGNOSIS — E785 Hyperlipidemia, unspecified: Secondary | ICD-10-CM | POA: Diagnosis not present

## 2023-09-14 DIAGNOSIS — I340 Nonrheumatic mitral (valve) insufficiency: Secondary | ICD-10-CM

## 2023-09-14 DIAGNOSIS — I4819 Other persistent atrial fibrillation: Secondary | ICD-10-CM | POA: Diagnosis not present

## 2023-09-14 DIAGNOSIS — I2510 Atherosclerotic heart disease of native coronary artery without angina pectoris: Secondary | ICD-10-CM | POA: Diagnosis not present

## 2023-09-14 DIAGNOSIS — I480 Paroxysmal atrial fibrillation: Secondary | ICD-10-CM | POA: Diagnosis not present

## 2023-09-14 DIAGNOSIS — Z7901 Long term (current) use of anticoagulants: Secondary | ICD-10-CM | POA: Diagnosis not present

## 2023-09-14 NOTE — Patient Instructions (Signed)
Medication Instructions:   Your physician recommends that you continue on your current medications as directed. Please refer to the Current Medication list given to you today.   *If you need a refill on your cardiac medications before your next appointment, please call your pharmacy*   Lab Work:  TODAY!!!! CMET/LIPID/CBC  If you have labs (blood work) drawn today and your tests are completely normal, you will receive your results only by: MyChart Message (if you have MyChart) OR A paper copy in the mail If you have any lab test that is abnormal or we need to change your treatment, we will call you to review the results.   Testing/Procedures:  None ordered.   Follow-Up: At [facility], you and your health needs are our priority.  As part of our continuing mission to provide you with exceptional heart care, we have created designated Provider Care Teams.  These Care Teams include your primary Cardiologist (physician) and Advanced Practice Providers (APPs -  Physician Assistants and Nurse Practitioners) who all work together to provide you with the care you need, when you need it.  We recommend signing up for the patient portal called "MyChart".  Sign up information is provided on this After Visit Summary.  MyChart is used to connect with patients for Virtual Visits (Telemedicine).  Patients are able to view lab/test results, encounter notes, upcoming appointments, etc.  Non-urgent messages can be sent to your provider as well.   To learn more about what you can do with MyChart, go to [email or web address].    Your next appointment:   1 year(s)  Provider:   Armanda Magic, MD     Other Instructions  Keep your appointment in January with Dr. Elberta Fortis.

## 2023-09-15 LAB — COMPREHENSIVE METABOLIC PANEL
ALT: 28 [IU]/L (ref 0–44)
AST: 31 [IU]/L (ref 0–40)
Albumin: 4.4 g/dL (ref 3.8–4.8)
Alkaline Phosphatase: 57 [IU]/L (ref 44–121)
BUN/Creatinine Ratio: 18 (ref 10–24)
BUN: 18 mg/dL (ref 8–27)
Bilirubin Total: 0.5 mg/dL (ref 0.0–1.2)
CO2: 27 mmol/L (ref 20–29)
Calcium: 10.2 mg/dL (ref 8.6–10.2)
Chloride: 103 mmol/L (ref 96–106)
Creatinine, Ser: 0.99 mg/dL (ref 0.76–1.27)
Globulin, Total: 2.5 g/dL (ref 1.5–4.5)
Glucose: 95 mg/dL (ref 70–99)
Potassium: 5.1 mmol/L (ref 3.5–5.2)
Sodium: 143 mmol/L (ref 134–144)
Total Protein: 6.9 g/dL (ref 6.0–8.5)
eGFR: 80 mL/min/{1.73_m2} (ref >59)

## 2023-09-15 LAB — LIPID PANEL
Chol/HDL Ratio: 5.8 ratio — ABNORMAL HIGH (ref 0.0–5.0)
Cholesterol, Total: 256 mg/dL — ABNORMAL HIGH (ref 100–199)
HDL: 44 mg/dL (ref >39)
LDL Chol Calc (NIH): 168 mg/dL — ABNORMAL HIGH (ref 0–99)
Triglycerides: 237 mg/dL — ABNORMAL HIGH (ref 0–149)
VLDL Cholesterol Cal: 44 mg/dL — ABNORMAL HIGH (ref 5–40)

## 2023-09-15 LAB — CBC
Hematocrit: 46.3 % (ref 37.5–51.0)
Hemoglobin: 14.8 g/dL (ref 13.0–17.7)
MCH: 29.7 pg (ref 26.6–33.0)
MCHC: 32 g/dL (ref 31.5–35.7)
MCV: 93 fL (ref 79–97)
Platelets: 165 10*3/uL (ref 150–450)
RBC: 4.99 x10E6/uL (ref 4.14–5.80)
RDW: 13.7 % (ref 11.6–15.4)
WBC: 6.6 10*3/uL (ref 3.4–10.8)

## 2023-09-16 ENCOUNTER — Encounter (HOSPITAL_BASED_OUTPATIENT_CLINIC_OR_DEPARTMENT_OTHER): Payer: Self-pay

## 2023-09-22 ENCOUNTER — Other Ambulatory Visit: Payer: Self-pay | Admitting: Cardiology

## 2023-09-28 DIAGNOSIS — I480 Paroxysmal atrial fibrillation: Secondary | ICD-10-CM | POA: Diagnosis not present

## 2023-09-28 DIAGNOSIS — Z7901 Long term (current) use of anticoagulants: Secondary | ICD-10-CM | POA: Diagnosis not present

## 2023-10-05 ENCOUNTER — Other Ambulatory Visit: Payer: Medicare Other

## 2023-10-06 ENCOUNTER — Ambulatory Visit
Admission: RE | Admit: 2023-10-06 | Discharge: 2023-10-06 | Disposition: A | Discharge status: Home / Self Care | Payer: Medicare Other | Source: Ambulatory Visit | Attending: Thoracic Surgery (Cardiothoracic Vascular Surgery) | Admitting: Thoracic Surgery (Cardiothoracic Vascular Surgery)

## 2023-10-06 DIAGNOSIS — I2510 Atherosclerotic heart disease of native coronary artery without angina pectoris: Secondary | ICD-10-CM | POA: Diagnosis not present

## 2023-10-06 DIAGNOSIS — I700 Atherosclerosis of aorta: Secondary | ICD-10-CM | POA: Diagnosis not present

## 2023-10-06 DIAGNOSIS — I77810 Thoracic aortic ectasia: Secondary | ICD-10-CM

## 2023-10-06 DIAGNOSIS — I7121 Aneurysm of the ascending aorta, without rupture: Secondary | ICD-10-CM | POA: Diagnosis not present

## 2023-10-12 ENCOUNTER — Other Ambulatory Visit: Payer: Self-pay | Admitting: Cardiology

## 2023-10-12 ENCOUNTER — Ambulatory Visit (INDEPENDENT_AMBULATORY_CARE_PROVIDER_SITE_OTHER): Payer: Medicare Other | Admitting: Surgical

## 2023-10-12 VITALS — BP 160/95 | HR 80 | Resp 20 | Ht 71.0 in | Wt 230.0 lb

## 2023-10-12 DIAGNOSIS — I480 Paroxysmal atrial fibrillation: Secondary | ICD-10-CM | POA: Diagnosis not present

## 2023-10-12 DIAGNOSIS — I77810 Thoracic aortic ectasia: Secondary | ICD-10-CM

## 2023-10-12 DIAGNOSIS — Z7901 Long term (current) use of anticoagulants: Secondary | ICD-10-CM | POA: Diagnosis not present

## 2023-10-12 NOTE — Patient Instructions (Signed)
Good blood pressure control as discussed.  Activity and lifestyle modification including exercise and nutrition.

## 2023-10-12 NOTE — Progress Notes (Signed)
[address]       [place] [address]             [phone]        Troy [name] [id number] 1951-07-11      History of Present Illness: Patient is a 72 year old male seen in the office on today's date ongoing surveillance of his ascending thoracic aortic aneurysm.  Measurements are stable over slightly lower at 4.2 cm.  There are no significant pulmonary findings.  He has known three-vessel coronary artery disease and persistent atrial fibrillation as well.  He is under cardiology care as well as EP.  He does have a history of hypertension.  He remains fairly active playing pickle ball frequently.  He has rare chest pain but he feels that this is mostly muscular in nature.  He does get short of breath with activity/exercise.    Past Medical History:  Diagnosis Date   Anginal pain (HCC)    Aortic regurgitation    Mild by echo 07/2022   Arthritis    "knees" (01/02/2017)   Ascending aorta dilatation (HCC)    39mm by CT 2017 and 42mm by CT 2019, 43mm by echo 07/2020, 40mm by echo 2022, 41 mm by echo 2023   CAD (coronary artery disease)    a. s/p PCI in 2010 in [place]. b. Unstable angina/LHC 06/18/2015 complicated by V fib arrest after contrast injection. Cath 06/19/2015 s/p DES to mid LAD and RPDA. c. 11/2016: chest pain/ abnormal nuclear stress test prompting cath 11/13/16 showing 80% ostial diag, 60% mid RCA, no acute lesions, medical therapy recommended.   Dyslipidemia    Dysrhythmia    A. Fib   Essential hypertension    Lung nodule < 6cm on CT 06/16/2015   Right lung   Mitral regurgitation    mild to moderate by echo 07/2020   Mitral regurgitation    Mild by echo 07/2022   OSA on CPAP    Persistent atrial fibrillation (HCC)    a. 06/2015 noted to be in new a-fib when arrived with unstable angina. Converted to NSR after being shocked in the cath lab for vfib;  b. 06/2015 Eliquis initiated as PAF noted on event monitor. c. Recurrent atrial fib?flutter  10/2016, issues with recurrent AF requiring multiple DCCVs in 02/2017. Failed sotalol, not candidate for Tikosyn due to cost/QTC, Multaq not felt likely strong enough.   Pulmonary nodule 07/2015   a. 1.5 x 1.2 cm smoothly marginated nodule in the central aspect of the right lower lobe with recommendation correlation with nonemergent PET-CT to exclude a neoplasm , stable by CT 07/2016   Severe left ventricular hypertrophy    Ventricular fibrillation (HCC)    a. occured during cath 06/18/2015.     Past Surgical History:  Procedure Laterality Date   CARDIAC CATHETERIZATION N/A 06/18/2015   Procedure: Left Heart Cath and Coronary Angiography;  Surgeon: Kathleene Hazel, MD;  Location: [facility] INVASIVE CV LAB;  Service: Cardiovascular;  Laterality: N/A;   CARDIAC CATHETERIZATION N/A 06/19/2015   Procedure: Coronary Stent Intervention;  Surgeon: Peter M Swaziland, MD;  Location: [facility] INVASIVE CV LAB;  Service: Cardiovascular;  Laterality: N/A;   CARDIAC CATHETERIZATION N/A 11/13/2016   Procedure: Left Heart Cath and Coronary Angiography;  Surgeon: Runell Gess, MD;  Location: [facility] INVASIVE CV LAB;  Service: Cardiovascular;  Laterality: N/A;   CARDIOVERSION N/A 11/17/2016   Procedure: CARDIOVERSION;  Surgeon: Laurey Morale, MD;  Location: [facility] ENDOSCOPY;  Service: Cardiovascular;  Laterality: N/A;   CARDIOVERSION N/A 01/03/2017   Procedure: CARDIOVERSION;  Surgeon: Lewayne Bunting, MD;  Location: [facility] OR;  Service: Cardiovascular;  Laterality: N/A;   CARDIOVERSION N/A 02/05/2017   Procedure: Cardioversion;  Surgeon: Marinus Maw, MD;  Location: [facility] INVASIVE CV LAB;  Service: Cardiovascular;  Laterality: N/A;   CARDIOVERSION N/A 02/20/2017   Procedure: Cardioversion;  Surgeon: Marinus Maw, MD;  Location: [facility] INVASIVE CV LAB;  Service: Cardiovascular;  Laterality: N/A;   CARTILAGE SURGERY Bilateral    "thumbs"   CATARACT EXTRACTION W/ INTRAOCULAR LENS  IMPLANT, BILATERAL Bilateral    COLONOSCOPY      CORONARY ANGIOPLASTY WITH STENT PLACEMENT  ~ 2007   JOINT REPLACEMENT     KNEE ARTHROSCOPY Right    LUMBAR LAMINECTOMY/DECOMPRESSION MICRODISCECTOMY Left 02/11/2023   Procedure: Left Lumbar Four-Five Microdiscectomy;  Surgeon: Donalee Citrin, MD;  Location: [facility] OR;  Service: Neurosurgery;  Laterality: Left;   RETINAL DETACHMENT SURGERY Bilateral    "laser thing"   TEE WITHOUT CARDIOVERSION N/A 11/17/2016   Procedure: TRANSESOPHAGEAL ECHOCARDIOGRAM (TEE);  Surgeon: Laurey Morale, MD;  Location: [facility] ENDOSCOPY;  Service: Cardiovascular;  Laterality: N/A;   TOTAL KNEE ARTHROPLASTY Left 2000s   TOTAL KNEE ARTHROPLASTY Right 02/08/2018   Procedure: RIGHT TOTAL KNEE ARTHROPLASTY;  Surgeon: Ollen Gross, MD;  Location: [facility] ORS;  Service: Orthopedics;  Laterality: Right;  with block     Allergies  Allergen Reactions   Gluten Meal Other (See Comments)    REACTION: Celiac disease (severe stomach pain)     Iodinated Contrast Media Anaphylaxis and Other (See Comments)    V-Fib arrest during cardiac cath suspected d/t contrast dye in 2016 Cardiac arrest   Metrizamide Other (See Comments)    V-Fib arrest during cardiac cath suspected d/t contrast dye in 2016   Tizanidine Anxiety    Depression   Whey Other (See Comments)    REACTION: Celiac disease (severe stomach pain)   Morphine And Codeine Other (See Comments)    REACTION: "REALLY BAD STOMACH PAIN"   Rosuvastatin     Myalgias on 40mg  daily   Apple Juice Diarrhea   Tizanidine Hcl Anxiety    Depression   Wheat Other (See Comments)    REACTION: Celiac disease (severe stomach pain)      Current Outpatient Medications on File Prior to Visit  Medication Sig Dispense Refill   ALPRAZolam (XANAX) 1 MG tablet Take 1 mg by mouth at bedtime.     Ascorbic Acid (VITAMIN C PO) Take 400 mg by mouth daily.     losartan (COZAAR) 100 MG tablet TAKE 1 TABLET(100 MG) BY MOUTH DAILY 30 tablet 0   metoprolol tartrate (LOPRESSOR) 100 MG tablet TAKE 1 TABLET(100  MG) BY MOUTH TWICE DAILY 180 tablet 3   TURMERIC PO Take 100 mg by mouth daily as needed (Muscle and joint pain).     warfarin (COUMADIN) 5 MG tablet TAKE AS DIRECTED BY ANTICOAGULATION CLINIC (Patient taking differently: Take 7.5-10 mg by mouth See admin instructions. Take 10 mg on Sunday, Tuesday and Thursday, Take 7.5 mg on Monday, Wednesday , Friday and Saturday) 150 tablet 1   HYDROcodone-acetaminophen (NORCO/VICODIN) 5-325 MG tablet Take 2 tablets by mouth every 4 (four) hours as needed for severe pain ((score 7 to 10)). (Patient not taking: Reported on 10/12/2023) 30 tablet 0   terbinafine (LAMISIL) 250 MG tablet Take 250 mg by mouth daily. (Patient not taking: Reported on 10/12/2023)     No current  facility-administered medications on file prior to visit.   Social History   Occupational History   Not on file  Tobacco Use   Smoking status: Never   Smokeless tobacco: Never   Tobacco comments:    Second-hand exposure through parents  Vaping Use   Vaping status: Never Used  Substance and Sexual Activity   Alcohol use: Not Currently    Alcohol/week: 2.0 standard drinks of alcohol    Types: 2 Glasses of wine per week   Drug use: No   Sexual activity: Yes     BP (!) 160/95   Pulse 80   Resp 20   Ht 5\' 11"  (1.803 m)   Wt 230 lb (104.3 kg)   SpO2 98% Comment: RA  BMI 32.08 kg/m   Physical Exam Constitutional:      General: He is not in acute distress.    Appearance: He is not ill-appearing.  HENT:     Head: Normocephalic and atraumatic.  Eyes:     General: No scleral icterus. Neck:     Vascular: No carotid bruit.  Cardiovascular:     Rate and Rhythm: Normal rate and regular rhythm.     Heart sounds: No murmur heard. Pulmonary:     Effort: Pulmonary effort is normal.     Breath sounds: Normal breath sounds.  Abdominal:     Tenderness: There is no abdominal tenderness.  Musculoskeletal:     Cervical back: Normal range of motion and neck supple.  Skin:    General:  Skin is warm and dry.     Capillary Refill: Capillary refill takes less than 2 seconds.     Coloration: Skin is not jaundiced.  Neurological:     Mental Status: He is alert and oriented to person, place, and time.  Psychiatric:        Thought Content: Thought content normal.     CTA Results:  Narrative & Impression  CLINICAL DATA:  Follow-up ascending thoracic aneurysm.   EXAM: CT CHEST WITHOUT CONTRAST   TECHNIQUE: Multidetector CT imaging of the chest was performed following the standard protocol without IV contrast.   RADIATION DOSE REDUCTION: This exam was performed according to the departmental dose-optimization program which includes automated exposure control, adjustment of the mA and/or kV according to patient size and/or use of iterative reconstruction technique.   COMPARISON:  Chest CT 09/30/2022   FINDINGS: Cardiovascular: The heart is within normal limits in size. No pericardial effusion. Mild tortuosity and calcification of the thoracic aorta is stable. Stable mild fusiform aneurysmal dilatation of the ascending thoracic aorta with maximum measurement of 4.2 cm. Recommend annual imaging followup by CTA or MRA. This recommendation follows 2010 ACCF/AHA/AATS/ACR/ASA/SCA/SCAI/SIR/STS/SVM Guidelines for the Diagnosis and Management of Patients with Thoracic Aortic Disease. Circulation. 2010; 121: B147-W295. Aortic aneurysm NOS (ICD10-I71.9)   Stable extensive three-vessel coronary artery calcifications.   Mediastinum/Nodes: Stable small scattered mediastinal and hilar lymph nodes. No mass or adenopathy. The esophagus is grossly normal.   Lungs/Pleura: No acute pulmonary process. No infiltrates, edema or effusions. No worrisome pulmonary lesions or nodules. Mild chronic lower lobe predominant bronchitic changes. No pleural effusions or pleural nodules.   Upper Abdomen: No significant upper abdominal findings. No hepatic or adrenal gland lesions. No upper  abdominal adenopathy. Stable vascular disease.   Musculoskeletal: No significant bony findings.   IMPRESSION: 1. Stable mild fusiform aneurysmal dilatation of the ascending thoracic aorta with maximum measurement of 4.2 cm. 2. Stable extensive three-vessel coronary artery calcifications. 3. No acute  pulmonary findings. 4. Aortic atherosclerosis.   Aortic Atherosclerosis (ICD10-I70.0).     Electronically Signed   By: Rudie Meyer M.D.   On: 10/06/2023 17:24          A/P: Ongoing surveillance of ascending thoracic aorta with maximum diameter of the aneurysm at 4.2 cm.  We discussed his blood pressure control as he may need to be improved.  He will continue to home monitor and report dated to his primary physician who manages blood pressure.  He is currently on losartan and metoprolol.  We will see the patient again in a routine 1 year chest CTA surveillance appointment.      Risk Modification:  Statin:  under cardiology care- has an intolerence to crestor  Smoking cessation instruction/counseling given:  non smoker  Patient was counseled on importance of Blood Pressure Control.  Despite Medical intervention if the patient notices persistently elevated blood pressure readings.  They are instructed to contact their Primary Care Physician  Please avoid use of Fluoroquinolones as this can potentially increase your risk of Aortic Rupture and/or Dissection  Patient educated on signs and symptoms of Aortic Dissection, handout also provided in AVS  Rowe Clack, PA-C 10/12/23

## 2023-10-14 DIAGNOSIS — N5201 Erectile dysfunction due to arterial insufficiency: Secondary | ICD-10-CM | POA: Diagnosis not present

## 2023-10-28 ENCOUNTER — Encounter: Payer: Self-pay | Admitting: Cardiology

## 2023-10-28 ENCOUNTER — Ambulatory Visit: Payer: Medicare Other | Attending: Cardiology | Admitting: Cardiology

## 2023-10-28 VITALS — BP 134/96 | HR 67 | Ht 71.0 in | Wt 234.8 lb

## 2023-10-28 DIAGNOSIS — I2510 Atherosclerotic heart disease of native coronary artery without angina pectoris: Secondary | ICD-10-CM | POA: Diagnosis not present

## 2023-10-28 DIAGNOSIS — G4733 Obstructive sleep apnea (adult) (pediatric): Secondary | ICD-10-CM

## 2023-10-28 DIAGNOSIS — I4811 Longstanding persistent atrial fibrillation: Secondary | ICD-10-CM

## 2023-10-28 DIAGNOSIS — D6869 Other thrombophilia: Secondary | ICD-10-CM

## 2023-10-28 DIAGNOSIS — I10 Essential (primary) hypertension: Secondary | ICD-10-CM

## 2023-10-28 NOTE — Progress Notes (Signed)
Electrophysiology Office Note:   Date:  10/28/2023  ID:  Troy [name], DOB 1951/03/26, MRN [id number]  Primary Cardiologist: Armanda Magic, MD Primary Heart Failure: None Electrophysiologist: Keasha Malkiewicz Jorja Loa, MD      History of Present Illness:   Troy [name] is a 72 y.o. male with h/o persistent atrial fibrillation, hypertension, coronary artery disease, obstructive sleep apnea seen today for routine electrophysiology followup.   Since last being seen in our clinic the patient reports increasing shortness of breath and fatigue.  He states that when he walks up a hill, he gets more short of breath.  He can play pickle ball, but plays quite slowly due to episodes of shortness of breath..  he denies chest pain, palpitations, PND, orthopnea, nausea, vomiting, dizziness, syncope, edema, weight gain, or early satiety.   Review of systems complete and found to be negative unless listed in HPI.   EP Information / Studies Reviewed:    EKG is not ordered today. EKG from 09/14/23 reviewed which showed atrial fibrillation        Risk Assessment/Calculations:    CHA2DS2-VASc Score = 3   This indicates a 3.2% annual risk of stroke. The patient's score is based upon: CHF History: 0 HTN History: 1 Diabetes History: 0 Stroke History: 0 Vascular Disease History: 1 Age Score: 1 Gender Score: 0            Physical Exam:   VS:  BP (!) 134/96   Pulse 67   Ht 5\' 11"  (1.803 m)   Wt 234 lb 12.8 oz (106.5 kg)   SpO2 97%   BMI 32.75 kg/m    Wt Readings from Last 3 Encounters:  10/28/23 234 lb 12.8 oz (106.5 kg)  10/12/23 230 lb (104.3 kg)  09/14/23 229 lb (103.9 kg)     GEN: Well nourished, well developed in no acute distress NECK: No JVD; No carotid bruits CARDIAC: Irregularly irregular rate and rhythm, no murmurs, rubs, gallops RESPIRATORY:  Clear to auscultation without rales, wheezing or rhonchi  ABDOMEN: Soft, non-tender, non-distended EXTREMITIES:  No edema; No  deformity   ASSESSMENT AND PLAN:    1.  Longstanding persistent atrial fibrillation: Currently on Coumadin.  He is feeling more short of breath and fatigue.  At this point, he would try it like to try rhythm control.  I discussed with him that rhythm control would be less successful as he has been in atrial fibrillation for quite some time.  Despite this, he is interested in ablation.  Risk and benefits have been discussed.  He understands the risks and is agreed to the procedure.  Risk, benefits, and alternatives to EP study and radiofrequency/pulse field ablation for afib were also discussed in detail today. These risks include but are not limited to stroke, bleeding, vascular damage, tamponade, perforation, damage to the esophagus, lungs, and other structures, pulmonary vein stenosis, worsening renal function, and death. The patient understands these risk and wishes to proceed.  We Leylani Duley therefore proceed with catheter ablation at the next available time.  Carto, ICE, anesthesia are requested for the procedure.  Zykiria Bruening also obtain CT PV protocol prior to the procedure to exclude LAA thrombus and further evaluate atrial anatomy.  2.  Hypertension: Well-controlled  3.  Coronary disease: No current chest pain.  Plan per primary cardiology  4.  Obstructive sleep apnea: CPAP compliance encouraged  5.  Secondary to coagula state: Currently on Coumadin for atrial fibrillation  Follow up with Dr. Elberta Fortis as usual post procedure  Signed, Chia Mowers Jorja Loa, MD

## 2023-10-28 NOTE — Patient Instructions (Signed)
Medication Instructions:  Your physician recommends that you continue on your current medications as directed. Please refer to the Current Medication list given to you today.  *If you need a refill on your cardiac medications before your next appointment, please call your pharmacy*   Lab Work: Pre procedure labs -- we will call you to schedule:  BMP & CBC  If you have a lab test that is abnormal and we need to change your treatment, we will call you to review the results -- otherwise no news is good news.    Testing/Procedures: Your physician has requested that you have cardiac CT 1 month PRIOR to your ablation. Cardiac computed tomography (CT) is a painless test that uses an x-ray machine to take clear, detailed pictures of your heart.  We will call you to schedule.  Your physician has recommended that you have an ablation. Catheter ablation is a medical procedure used to treat some cardiac arrhythmias (irregular heartbeats). During catheter ablation, a long, thin, flexible tube is put into a blood vessel in your groin (upper thigh), or neck. This tube is called an ablation catheter. It is then guided to your heart through the blood vessel. Radio frequency waves destroy small areas of heart tissue where abnormal heartbeats may cause an arrhythmia to start.   Your ablation is scheduled for ___________.   We will call you to schedule this procedure, it may be several weeks or more.   Follow-Up: At [facility], you and your health needs are our priority.  As part of our continuing mission to provide you with exceptional heart care, we have created designated Provider Care Teams.  These Care Teams include your primary Cardiologist (physician) and Advanced Practice Providers (APPs -  Physician Assistants and Nurse Practitioners) who all work together to provide you with the care you need, when you need it.  Your next appointment:   1 month(s) after your ablation  The format for your next  appointment:   In Person  Provider:   AFib clinic   Thank you for choosing [facility]!!   Dory Horn, RN [phone]    Other Instructions   Cardiac Ablation Cardiac ablation is a procedure to destroy (ablate) some heart tissue that is sending bad signals. These bad signals cause problems in heart rhythm. The heart has many areas that make these signals. If there are problems in these areas, they can make the heart beat in a way that is not normal. Destroying some tissues can help make the heart rhythm normal. Tell your doctor about: Any allergies you have. All medicines you are taking. These include vitamins, herbs, eye drops, creams, and over-the-counter medicines. Any problems you or family members have had with medicines that make you fall asleep (anesthetics). Any blood disorders you have. Any surgeries you have had. Any medical conditions you have, such as kidney failure. Whether you are pregnant or may be pregnant. What are the risks? This is a safe procedure. But problems may occur, including: Infection. Bruising and bleeding. Bleeding into the chest. Stroke or blood clots. Damage to nearby areas of your body. Allergies to medicines or dyes. The need for a pacemaker if the normal system is damaged. Failure of the procedure to treat the problem. What happens before the procedure? Medicines Ask your doctor about: Changing or stopping your normal medicines. This is important. Taking aspirin and ibuprofen. Do not take these medicines unless your doctor tells you to take them. Taking other medicines, vitamins, herbs, and  supplements. General instructions Follow instructions from your doctor about what you cannot eat or drink. Plan to have someone take you home from the hospital or clinic. If you will be going home right after the procedure, plan to have someone with you for 24 hours. Ask your doctor what steps will be taken to prevent infection. What  happens during the procedure?  An IV tube will be put into one of your veins. You will be given a medicine to help you relax. The skin on your neck or groin will be numbed. A cut (incision) will be made in your neck or groin. A needle will be put through your cut and into a large vein. A tube (catheter) will be put into the needle. The tube will be moved to your heart. Dye may be put through the tube. This helps your doctor see your heart. Small devices (electrodes) on the tube will send out signals. A type of energy will be used to destroy some heart tissue. The tube will be taken out. Pressure will be held on your cut. This helps stop bleeding. A bandage will be put over your cut. The exact procedure may vary among doctors and hospitals. What happens after the procedure? You will be watched until you leave the hospital or clinic. This includes checking your heart rate, breathing rate, oxygen, and blood pressure. Your cut will be watched for bleeding. You will need to lie still for a few hours. Do not drive for 24 hours or as long as your doctor tells you. Summary Cardiac ablation is a procedure to destroy some heart tissue. This is done to treat heart rhythm problems. Tell your doctor about any medical conditions you may have. Tell him or her about all medicines you are taking to treat them. This is a safe procedure. But problems may occur. These include infection, bruising, bleeding, and damage to nearby areas of your body. Follow what your doctor tells you about food and drink. You may also be told to change or stop some of your medicines. After the procedure, do not drive for 24 hours or as long as your doctor tells you. This information is not intended to replace advice given to you by your health care provider. Make sure you discuss any questions you have with your health care provider. Document Revised: 01/17/2022 Document Reviewed: 09/29/2019 Elsevier Patient Education  2023  Elsevier Inc[employer]   Cardiac Ablation, Care After  This sheet gives you information about how to care for yourself after your procedure. Your health care provider may also give you more specific instructions. If you have problems or questions, contact your health care provider. What can I expect after the procedure? After the procedure, it is common to have: Bruising around your puncture site. Tenderness around your puncture site. Skipped heartbeats. If you had an atrial fibrillation ablation, you may have atrial fibrillation during the first several months after your procedure.  Tiredness (fatigue).  Follow these instructions at home: Puncture site care  Follow instructions from your health care provider about how to take care of your puncture site. Make sure you: If present, leave stitches (sutures), skin glue, or adhesive strips in place. These skin closures may need to stay in place for up to 2 weeks. If adhesive strip edges start to loosen and curl up, you may trim the loose edges. Do not remove adhesive strips completely unless your health care provider tells you to do that. If a large square bandage is present, this  may be removed 24 hours after surgery.  Check your puncture site every day for signs of infection. Check for: Redness, swelling, or pain. Fluid or blood. If your puncture site starts to bleed, lie down on your back, apply firm pressure to the area, and contact your health care provider. Warmth. Pus or a bad smell. A pea or small marble sized lump at the site is normal and can take up to three months to resolve.  Driving Do not drive for at least 4 days after your procedure or however long your health care provider recommends. (Do not resume driving if you have previously been instructed not to drive for other health reasons.) Do not drive or use heavy machinery while taking prescription pain medicine. Activity Avoid activities that take a lot of effort for at least 7 days  after your procedure. Do not lift anything that is heavier than 5 lb (4.5 kg) for one week.  No sexual activity for 1 week.  Return to your normal activities as told by your health care provider. Ask your health care provider what activities are safe for you. General instructions Take over-the-counter and prescription medicines only as told by your health care provider. Do not use any products that contain nicotine or tobacco, such as cigarettes and e-cigarettes. If you need help quitting, ask your health care provider. You may shower after 24 hours, but Do not take baths, swim, or use a hot tub for 1 week.  Do not drink alcohol for 24 hours after your procedure. Keep all follow-up visits as told by your health care provider. This is important. Contact a health care provider if: You have redness, mild swelling, or pain around your puncture site. You have fluid or blood coming from your puncture site that stops after applying firm pressure to the area. Your puncture site feels warm to the touch. You have pus or a bad smell coming from your puncture site. You have a fever. You have chest pain or discomfort that spreads to your neck, jaw, or arm. You have chest pain that is worse with lying on your back or taking a deep breath. You are sweating a lot. You feel nauseous. You have a fast or irregular heartbeat. You have shortness of breath. You are dizzy or light-headed and feel the need to lie down. You have pain or numbness in the arm or leg closest to your puncture site. Get help right away if: Your puncture site suddenly swells. Your puncture site is bleeding and the bleeding does not stop after applying firm pressure to the area. These symptoms may represent a serious problem that is an emergency. Do not wait to see if the symptoms will go away. Get medical help right away. Call your local emergency services (911 in the [place] Do not drive yourself to the hospital. Summary After the  procedure, it is normal to have bruising and tenderness at the puncture site in your groin, neck, or forearm. Check your puncture site every day for signs of infection. Get help right away if your puncture site is bleeding and the bleeding does not stop after applying firm pressure to the area. This is a medical emergency. This information is not intended to replace advice given to you by your health care provider. Make sure you discuss any questions you have with your health care provider.

## 2023-11-16 DIAGNOSIS — Z7901 Long term (current) use of anticoagulants: Secondary | ICD-10-CM | POA: Diagnosis not present

## 2023-11-16 DIAGNOSIS — I480 Paroxysmal atrial fibrillation: Secondary | ICD-10-CM | POA: Diagnosis not present

## 2023-11-24 ENCOUNTER — Ambulatory Visit: Payer: Medicare Other | Admitting: Cardiology

## 2023-12-01 ENCOUNTER — Telehealth: Payer: Self-pay | Admitting: *Deleted

## 2023-12-01 DIAGNOSIS — I4811 Longstanding persistent atrial fibrillation: Secondary | ICD-10-CM

## 2023-12-01 NOTE — Telephone Encounter (Addendum)
Scheduled afib ablation for 01/11/24.  Aware office will be in touch to go over instructions.  Patient verbalized understanding and agreeable to plan.   Left message w/ PCP office to arrange INR checks and results being sent to [place] asap.

## 2023-12-01 NOTE — Telephone Encounter (Signed)
Pt scheduled for an AFib Ablation 01/11/2024.  He will need 3 weekly INRs check prior to ablation (these will occur the 3 weeks prior to ablation, and we will need result asap).  Have reached out to PCP office to assist and will fax this note, along w/ last OV note, to their office as requested. They can fax results to, attn: Dr. Elberta Fortis / [phone].

## 2023-12-01 NOTE — Telephone Encounter (Signed)
INR has been schedule for 1/28. And will discuss with the patient the upcoming procedure with the patient. Asking that our office send over in notes that is need. Fax # [phone] Audrie Gallus. Please advise

## 2023-12-08 DIAGNOSIS — Z7901 Long term (current) use of anticoagulants: Secondary | ICD-10-CM | POA: Diagnosis not present

## 2023-12-08 DIAGNOSIS — E785 Hyperlipidemia, unspecified: Secondary | ICD-10-CM | POA: Diagnosis not present

## 2023-12-08 DIAGNOSIS — I110 Hypertensive heart disease with heart failure: Secondary | ICD-10-CM | POA: Diagnosis not present

## 2023-12-08 DIAGNOSIS — Z125 Encounter for screening for malignant neoplasm of prostate: Secondary | ICD-10-CM | POA: Diagnosis not present

## 2023-12-08 DIAGNOSIS — I5020 Unspecified systolic (congestive) heart failure: Secondary | ICD-10-CM | POA: Diagnosis not present

## 2023-12-08 DIAGNOSIS — R739 Hyperglycemia, unspecified: Secondary | ICD-10-CM | POA: Diagnosis not present

## 2023-12-08 DIAGNOSIS — I480 Paroxysmal atrial fibrillation: Secondary | ICD-10-CM | POA: Diagnosis not present

## 2023-12-09 LAB — LAB REPORT - SCANNED
A1c: 5.4
EGFR: 82.5

## 2023-12-15 DIAGNOSIS — I480 Paroxysmal atrial fibrillation: Secondary | ICD-10-CM | POA: Diagnosis not present

## 2023-12-15 DIAGNOSIS — Z7901 Long term (current) use of anticoagulants: Secondary | ICD-10-CM | POA: Diagnosis not present

## 2023-12-16 ENCOUNTER — Telehealth: Payer: Self-pay

## 2023-12-16 DIAGNOSIS — D692 Other nonthrombocytopenic purpura: Secondary | ICD-10-CM | POA: Diagnosis not present

## 2023-12-16 DIAGNOSIS — E785 Hyperlipidemia, unspecified: Secondary | ICD-10-CM | POA: Diagnosis not present

## 2023-12-16 DIAGNOSIS — G720 Drug-induced myopathy: Secondary | ICD-10-CM | POA: Diagnosis not present

## 2023-12-16 DIAGNOSIS — Z7901 Long term (current) use of anticoagulants: Secondary | ICD-10-CM | POA: Diagnosis not present

## 2023-12-16 DIAGNOSIS — Z1339 Encounter for screening examination for other mental health and behavioral disorders: Secondary | ICD-10-CM | POA: Diagnosis not present

## 2023-12-16 DIAGNOSIS — I4811 Longstanding persistent atrial fibrillation: Secondary | ICD-10-CM

## 2023-12-16 DIAGNOSIS — I480 Paroxysmal atrial fibrillation: Secondary | ICD-10-CM | POA: Diagnosis not present

## 2023-12-16 DIAGNOSIS — I712 Thoracic aortic aneurysm, without rupture, unspecified: Secondary | ICD-10-CM | POA: Diagnosis not present

## 2023-12-16 DIAGNOSIS — I10 Essential (primary) hypertension: Secondary | ICD-10-CM | POA: Diagnosis not present

## 2023-12-16 DIAGNOSIS — I25118 Atherosclerotic heart disease of native coronary artery with other forms of angina pectoris: Secondary | ICD-10-CM | POA: Diagnosis not present

## 2023-12-16 DIAGNOSIS — R82998 Other abnormal findings in urine: Secondary | ICD-10-CM | POA: Diagnosis not present

## 2023-12-16 DIAGNOSIS — D6869 Other thrombophilia: Secondary | ICD-10-CM | POA: Diagnosis not present

## 2023-12-16 DIAGNOSIS — I700 Atherosclerosis of aorta: Secondary | ICD-10-CM | POA: Diagnosis not present

## 2023-12-16 DIAGNOSIS — Z1331 Encounter for screening for depression: Secondary | ICD-10-CM | POA: Diagnosis not present

## 2023-12-16 DIAGNOSIS — Z0000 Encounter for general adult medical examination without abnormal findings: Secondary | ICD-10-CM | POA: Diagnosis not present

## 2023-12-16 DIAGNOSIS — R911 Solitary pulmonary nodule: Secondary | ICD-10-CM | POA: Diagnosis not present

## 2023-12-16 MED ORDER — PREDNISONE 50 MG PO TABS: ORAL_TABLET | ORAL | 0 refills | Status: DC

## 2023-12-16 NOTE — Telephone Encounter (Signed)
 Called pt to discuss AF Ablation. He needs 3 weekly INR checks. Pt states that he had his yearly check up with his PCP this morning and they have arranged his weekly INR's.  He also has had recent labs with his PCP and I have called and ask them to fax us  the results. Pt is aware if they are out of date or not what we need then I will call and have him come in for labs that we need.   Pt is aware that Prednisone  has been sent to pharmacy for his CT.  Letters have been sent via MyChart.  Pre-meds for CT have been sent to Pharmacy.

## 2023-12-17 DIAGNOSIS — I4811 Longstanding persistent atrial fibrillation: Secondary | ICD-10-CM | POA: Diagnosis not present

## 2023-12-17 NOTE — Telephone Encounter (Signed)
 I called pt to let him know that I received his labs from him PCP and they are out of the time frame needed for the procedure. He will come to [facility] today to have updated labs.   Also, I spoke with him about his Contrast allergy and reaction he's had in the past. He's worried that the Prednisone  will not be strong enough to keep him from having the same reaction. I assured him that I had reached out to Dr. Inocencio and as soon as I heard back from him I would be in touch and let him know his recommendation.

## 2023-12-17 NOTE — Addendum Note (Signed)
 Addended by: Tierrah Anastos on: 12/17/2023 09:13 AM   Modules accepted: Orders

## 2023-12-18 LAB — BASIC METABOLIC PANEL
BUN/Creatinine Ratio: 23 (ref 10–24)
BUN: 22 mg/dL (ref 8–27)
CO2: 22 mmol/L (ref 20–29)
Calcium: 9.5 mg/dL (ref 8.6–10.2)
Chloride: 105 mmol/L (ref 96–106)
Creatinine, Ser: 0.95 mg/dL (ref 0.76–1.27)
Glucose: 92 mg/dL (ref 70–99)
Potassium: 4.6 mmol/L (ref 3.5–5.2)
Sodium: 142 mmol/L (ref 134–144)
eGFR: 84 mL/min/{1.73_m2} (ref >59)

## 2023-12-18 LAB — CBC
Hematocrit: 44.3 % (ref 37.5–51.0)
Hemoglobin: 13.9 g/dL (ref 13.0–17.7)
MCH: 28.5 pg (ref 26.6–33.0)
MCHC: 31.4 g/dL — ABNORMAL LOW (ref 31.5–35.7)
MCV: 91 fL (ref 79–97)
Platelets: 138 10*3/uL — ABNORMAL LOW (ref 150–450)
RBC: 4.88 x10E6/uL (ref 4.14–5.80)
RDW: 13.3 % (ref 11.6–15.4)
WBC: 6.6 10*3/uL (ref 3.4–10.8)

## 2023-12-18 NOTE — Telephone Encounter (Signed)
 Faxed request to PCP, confirmation received.

## 2023-12-18 NOTE — Telephone Encounter (Signed)
 Informed pt and CT dept that per Dr. Lawana Pray his CT can be cancelled.   Dr. Lawana Pray will review and let us  know if he would like for him to have a TEE.

## 2023-12-21 ENCOUNTER — Ambulatory Visit (HOSPITAL_COMMUNITY): Payer: Medicare Other

## 2023-12-21 ENCOUNTER — Encounter (HOSPITAL_COMMUNITY): Payer: Self-pay

## 2023-12-22 DIAGNOSIS — Z7901 Long term (current) use of anticoagulants: Secondary | ICD-10-CM | POA: Diagnosis not present

## 2023-12-22 DIAGNOSIS — I480 Paroxysmal atrial fibrillation: Secondary | ICD-10-CM | POA: Diagnosis not present

## 2023-12-23 NOTE — Telephone Encounter (Signed)
Spoke to PCP office. Pt had check on 2/11  (result 2.1) and scheduled for weekly checks next week and week after. Fax number given again and asked them  to fax result asap once complete.

## 2023-12-25 ENCOUNTER — Telehealth: Payer: Self-pay | Admitting: Cardiology

## 2023-12-25 MED ORDER — LOSARTAN POTASSIUM 100 MG PO TABS: ORAL_TABLET | ORAL | 3 refills | Status: AC

## 2023-12-25 NOTE — Telephone Encounter (Signed)
*  STAT* If patient is at the pharmacy, call can be transferred to refill team.   1. Which medications need to be refilled? (please list name of each medication and dose if known)   losartan (COZAAR) 100 MG tablet   2. Would you like to learn more about the convenience, safety, & potential cost savings by using the [facility]?   3. Are you open to using the [facility] (Type [facility]. ).  4. Which pharmacy/location (including street and city if local pharmacy) is medication to be sent to?  [id number] - [place], [place] - [address] AT [facility] OF [address]   5. Do they need a 30 day or 90 day supply?   90 day  Patient stated he has 3 days left of this medication.

## 2023-12-25 NOTE — Telephone Encounter (Signed)
Pt's medication was sent to pt's pharmacy as requested. Confirmation received.

## 2023-12-29 DIAGNOSIS — Z7901 Long term (current) use of anticoagulants: Secondary | ICD-10-CM | POA: Diagnosis not present

## 2023-12-29 DIAGNOSIS — I480 Paroxysmal atrial fibrillation: Secondary | ICD-10-CM | POA: Diagnosis not present

## 2024-01-08 DIAGNOSIS — D485 Neoplasm of uncertain behavior of skin: Secondary | ICD-10-CM | POA: Diagnosis not present

## 2024-01-08 DIAGNOSIS — Z85828 Personal history of other malignant neoplasm of skin: Secondary | ICD-10-CM | POA: Diagnosis not present

## 2024-01-08 DIAGNOSIS — Z7901 Long term (current) use of anticoagulants: Secondary | ICD-10-CM | POA: Diagnosis not present

## 2024-01-08 DIAGNOSIS — L570 Actinic keratosis: Secondary | ICD-10-CM | POA: Diagnosis not present

## 2024-01-08 DIAGNOSIS — L814 Other melanin hyperpigmentation: Secondary | ICD-10-CM | POA: Diagnosis not present

## 2024-01-08 DIAGNOSIS — I480 Paroxysmal atrial fibrillation: Secondary | ICD-10-CM | POA: Diagnosis not present

## 2024-01-08 DIAGNOSIS — D0462 Carcinoma in situ of skin of left upper limb, including shoulder: Secondary | ICD-10-CM | POA: Diagnosis not present

## 2024-01-08 DIAGNOSIS — L821 Other seborrheic keratosis: Secondary | ICD-10-CM | POA: Diagnosis not present

## 2024-01-08 DIAGNOSIS — S0010XA Contusion of unspecified eyelid and periocular area, initial encounter: Secondary | ICD-10-CM | POA: Diagnosis not present

## 2024-01-08 DIAGNOSIS — D692 Other nonthrombocytopenic purpura: Secondary | ICD-10-CM | POA: Diagnosis not present

## 2024-01-08 DIAGNOSIS — C44519 Basal cell carcinoma of skin of other part of trunk: Secondary | ICD-10-CM | POA: Diagnosis not present

## 2024-01-08 NOTE — Pre-Procedure Instructions (Signed)
 Attempted to call patient regarding procedure instructions.  Left voicemail on the following items: Arrival time 0515 Nothing to eat or drink after midnight No meds AM of procedure Responsible person to drive you home and stay with you for 24 hrs  Have you missed any doses of anti-coagulant Coumadin- should be taken once a day, if you have missed any doses please let us know.

## 2024-01-10 NOTE — Anesthesia Preprocedure Evaluation (Signed)
 Anesthesia Evaluation  Patient identified by MRN, date of birth, ID band Patient awake    Reviewed: Allergy & Precautions, NPO status , Patient's Chart, lab work & pertinent test results  Airway Mallampati: II  TM Distance: >3 FB     Dental no notable dental hx. (+) Teeth Intact, Caps, Dental Advisory Given   Pulmonary sleep apnea and Continuous Positive Airway Pressure Ventilation  Hx/o pulmonary nodules- stable   Pulmonary exam normal breath sounds clear to auscultation       Cardiovascular hypertension, Pt. on medications + angina  + CAD and + Cardiac Stents  Normal cardiovascular exam+ dysrhythmias Atrial Fibrillation + Valvular Problems/Murmurs AI and MR  Rhythm:Irregular Rate:Normal  EKG 09/14/23 Atrial fibrillation with premature ventricular or aberrantly conducted complexes Minimal voltage criteria for LVH, may be normal variant  Echo 07/11/22 1. Distal inferior hypokinesis. Left ventricular ejection fraction, by  estimation, is 55 to 60%. The left ventricle has normal function. The left  ventricle has no regional wall motion abnormalities. The left ventricular  internal cavity size was mildly  dilated. There is mild left ventricular hypertrophy. Left ventricular  diastolic parameters are indeterminate.   2. Right ventricular systolic function is normal. The right ventricular  size is normal.   3. Left atrial size was severely dilated.   4. Right atrial size was mildly dilated.   5. Mild mitral valve regurgitation.   6. The aortic valve is tricuspid. Aortic valve regurgitation is mild.  Aortic valve sclerosis/calcification is present, without any evidence of  aortic stenosis.   7. The inferior vena cava is normal in size with greater than 50%  respiratory variability, suggesting right atrial pressure of 3 mmHg.    Stents 2010, 2016 mid LAD DES, Stents LCx and RCA  Aortic dilation 41mm 2023   Neuro/Psych negative  neurological ROS  negative psych ROS   GI/Hepatic negative GI ROS, Neg liver ROS,,,  Endo/Other  Obesity HLD  Renal/GU Renal disease  negative genitourinary   Musculoskeletal  (+) Arthritis , Osteoarthritis,  Knees    Abdominal   Peds  Hematology Coumadin therapy- last dose  PT  23.3    INR   2.0   Anesthesia Other Findings   Reproductive/Obstetrics                             Anesthesia Physical Anesthesia Plan  ASA: 3  Anesthesia Plan: General   Post-op Pain Management: Minimal or no pain anticipated   Induction: Intravenous  PONV Risk Score and Plan: 2 and Treatment may vary due to age or medical condition, Ondansetron and TIVA  Airway Management Planned: Oral ETT  Additional Equipment: None  Intra-op Plan:   Post-operative Plan: Extubation in OR  Informed Consent: I have reviewed the patients History and Physical, chart, labs and discussed the procedure including the risks, benefits and alternatives for the proposed anesthesia with the patient or authorized representative who has indicated his/her understanding and acceptance.     Dental advisory given  Plan Discussed with: CRNA and Anesthesiologist  Anesthesia Plan Comments:         Anesthesia Quick Evaluation

## 2024-01-11 ENCOUNTER — Encounter (HOSPITAL_COMMUNITY)
Admission: RE | Disposition: A | Discharge status: Home / Self Care | Payer: Self-pay | Source: Home / Self Care | Attending: Cardiology

## 2024-01-11 ENCOUNTER — Ambulatory Visit (HOSPITAL_COMMUNITY)
Admission: RE | Admit: 2024-01-11 | Discharge: 2024-01-11 | Disposition: A | Discharge status: Home / Self Care | Source: Home / Self Care | Attending: Cardiology | Admitting: Cardiology

## 2024-01-11 ENCOUNTER — Encounter (HOSPITAL_COMMUNITY): Payer: Self-pay | Admitting: Cardiology

## 2024-01-11 ENCOUNTER — Other Ambulatory Visit: Payer: Self-pay

## 2024-01-11 ENCOUNTER — Ambulatory Visit (HOSPITAL_COMMUNITY)
Admission: RE | Admit: 2024-01-11 | Discharge: 2024-01-11 | Disposition: A | Discharge status: Home / Self Care | Payer: Medicare Other | Attending: Cardiology | Admitting: Cardiology

## 2024-01-11 ENCOUNTER — Ambulatory Visit (HOSPITAL_COMMUNITY): Payer: Self-pay | Admitting: Anesthesiology

## 2024-01-11 ENCOUNTER — Ambulatory Visit (HOSPITAL_BASED_OUTPATIENT_CLINIC_OR_DEPARTMENT_OTHER): Payer: Self-pay | Admitting: Anesthesiology

## 2024-01-11 DIAGNOSIS — I082 Rheumatic disorders of both aortic and tricuspid valves: Secondary | ICD-10-CM | POA: Insufficient documentation

## 2024-01-11 DIAGNOSIS — I4892 Unspecified atrial flutter: Secondary | ICD-10-CM

## 2024-01-11 DIAGNOSIS — Z7901 Long term (current) use of anticoagulants: Secondary | ICD-10-CM | POA: Insufficient documentation

## 2024-01-11 DIAGNOSIS — I4891 Unspecified atrial fibrillation: Secondary | ICD-10-CM

## 2024-01-11 DIAGNOSIS — I2510 Atherosclerotic heart disease of native coronary artery without angina pectoris: Secondary | ICD-10-CM

## 2024-01-11 DIAGNOSIS — G4733 Obstructive sleep apnea (adult) (pediatric): Secondary | ICD-10-CM | POA: Diagnosis not present

## 2024-01-11 DIAGNOSIS — E785 Hyperlipidemia, unspecified: Secondary | ICD-10-CM | POA: Diagnosis not present

## 2024-01-11 DIAGNOSIS — E669 Obesity, unspecified: Secondary | ICD-10-CM | POA: Insufficient documentation

## 2024-01-11 DIAGNOSIS — Z6833 Body mass index (BMI) 33.0-33.9, adult: Secondary | ICD-10-CM | POA: Insufficient documentation

## 2024-01-11 DIAGNOSIS — Z79899 Other long term (current) drug therapy: Secondary | ICD-10-CM | POA: Diagnosis not present

## 2024-01-11 DIAGNOSIS — I4811 Longstanding persistent atrial fibrillation: Secondary | ICD-10-CM | POA: Diagnosis not present

## 2024-01-11 DIAGNOSIS — I10 Essential (primary) hypertension: Secondary | ICD-10-CM

## 2024-01-11 DIAGNOSIS — I4819 Other persistent atrial fibrillation: Secondary | ICD-10-CM

## 2024-01-11 HISTORY — PX: ATRIAL FIBRILLATION ABLATION: EP1191

## 2024-01-11 HISTORY — PX: TRANSESOPHAGEAL ECHOCARDIOGRAM (CATH LAB): EP1270

## 2024-01-11 LAB — PROTIME-INR
INR: 2 — ABNORMAL HIGH (ref 0.8–1.2)
Prothrombin Time: 23.3 s — ABNORMAL HIGH (ref 11.4–15.2)

## 2024-01-11 LAB — ECHO TEE

## 2024-01-11 SURGERY — ATRIAL FIBRILLATION ABLATION
Anesthesia: General

## 2024-01-11 MED ORDER — PHENYLEPHRINE HCL-NACL 20-0.9 MG/250ML-% IV SOLN
INTRAVENOUS | Status: DC | PRN
  Administered 2024-01-11: 10 ug/min via INTRAVENOUS

## 2024-01-11 MED ORDER — FENTANYL CITRATE (PF) 100 MCG/2ML IJ SOLN
INTRAMUSCULAR | Status: DC | PRN
  Administered 2024-01-11: 100 ug via INTRAVENOUS

## 2024-01-11 MED ORDER — ATROPINE SULFATE 1 MG/10ML IJ SOSY
PREFILLED_SYRINGE | INTRAMUSCULAR | Status: DC | PRN
  Administered 2024-01-11: 1 mg via INTRAVENOUS

## 2024-01-11 MED ORDER — ATROPINE SULFATE 1 MG/10ML IJ SOSY
PREFILLED_SYRINGE | INTRAMUSCULAR | Status: AC
  Filled 2024-01-11: qty 10

## 2024-01-11 MED ORDER — MIDAZOLAM HCL 2 MG/2ML IJ SOLN
INTRAMUSCULAR | Status: DC | PRN
  Administered 2024-01-11: 2 mg via INTRAVENOUS

## 2024-01-11 MED ORDER — HEPARIN (PORCINE) IN NACL 1000-0.9 UT/500ML-% IV SOLN
INTRAVENOUS | Status: DC | PRN
Start: 2024-01-11 — End: 2024-01-11
  Administered 2024-01-11 (×3): 500 mL

## 2024-01-11 MED ORDER — PROPOFOL 10 MG/ML IV BOLUS
INTRAVENOUS | Status: DC | PRN
  Administered 2024-01-11: 100 mg via INTRAVENOUS

## 2024-01-11 MED ORDER — FENTANYL CITRATE (PF) 100 MCG/2ML IJ SOLN
INTRAMUSCULAR | Status: AC
  Filled 2024-01-11: qty 2

## 2024-01-11 MED ORDER — SODIUM CHLORIDE 0.9% FLUSH: 3.0000 mL | INTRAVENOUS | Status: DC | PRN

## 2024-01-11 MED ORDER — ROCURONIUM BROMIDE 10 MG/ML (PF) SYRINGE
PREFILLED_SYRINGE | INTRAVENOUS | Status: DC | PRN
  Administered 2024-01-11: 40 mg via INTRAVENOUS
  Administered 2024-01-11: 60 mg via INTRAVENOUS

## 2024-01-11 MED ORDER — ACETAMINOPHEN 325 MG PO TABS: 650.0000 mg | ORAL_TABLET | ORAL | Status: DC | PRN

## 2024-01-11 MED ORDER — SUGAMMADEX SODIUM 200 MG/2ML IV SOLN
INTRAVENOUS | Status: DC | PRN
  Administered 2024-01-11: 200 mg via INTRAVENOUS

## 2024-01-11 MED ORDER — SODIUM CHLORIDE 0.9 % IV SOLN
INTRAVENOUS | Status: DC
Start: 2024-01-11 — End: 2024-01-11

## 2024-01-11 MED ORDER — HEPARIN SODIUM (PORCINE) 1000 UNIT/ML IJ SOLN
INTRAMUSCULAR | Status: AC
  Filled 2024-01-11: qty 10

## 2024-01-11 MED ORDER — HEPARIN SODIUM (PORCINE) 1000 UNIT/ML IJ SOLN
INTRAMUSCULAR | Status: DC | PRN
  Administered 2024-01-11: 15000 [IU] via INTRAVENOUS
  Administered 2024-01-11: 2000 [IU] via INTRAVENOUS

## 2024-01-11 MED ORDER — PROPOFOL 500 MG/50ML IV EMUL
INTRAVENOUS | Status: DC | PRN
Start: 2024-01-11 — End: 2024-01-11
  Administered 2024-01-11: 150 ug/kg/min via INTRAVENOUS

## 2024-01-11 MED ORDER — LIDOCAINE 2% (20 MG/ML) 5 ML SYRINGE
INTRAMUSCULAR | Status: DC | PRN
  Administered 2024-01-11: 100 mg via INTRAVENOUS

## 2024-01-11 MED ORDER — MIDAZOLAM HCL 2 MG/2ML IJ SOLN
INTRAMUSCULAR | Status: AC
  Filled 2024-01-11: qty 2

## 2024-01-11 MED ORDER — HEPARIN SODIUM (PORCINE) 1000 UNIT/ML IJ SOLN
INTRAMUSCULAR | Status: AC
  Filled 2024-01-11: qty 20

## 2024-01-11 MED ORDER — DEXAMETHASONE SODIUM PHOSPHATE 10 MG/ML IJ SOLN
INTRAMUSCULAR | Status: DC | PRN
  Administered 2024-01-11: 10 mg via INTRAVENOUS

## 2024-01-11 MED ORDER — SODIUM CHLORIDE 0.9 % IV SOLN: 250.0000 mL | INTRAVENOUS | Status: DC | PRN

## 2024-01-11 MED ORDER — SODIUM CHLORIDE 0.9% FLUSH: 3.0000 mL | Freq: Two times a day (BID) | INTRAVENOUS | Status: DC

## 2024-01-11 MED ORDER — ONDANSETRON HCL 4 MG/2ML IJ SOLN
INTRAMUSCULAR | Status: DC | PRN
  Administered 2024-01-11: 4 mg via INTRAVENOUS

## 2024-01-11 MED ORDER — PROTAMINE SULFATE 10 MG/ML IV SOLN
INTRAVENOUS | Status: DC | PRN
  Administered 2024-01-11: 15 mg via INTRAVENOUS
  Administered 2024-01-11: 25 mg via INTRAVENOUS

## 2024-01-11 MED ORDER — ONDANSETRON HCL 4 MG/2ML IJ SOLN: 4.0000 mg | Freq: Four times a day (QID) | INTRAMUSCULAR | Status: DC | PRN

## 2024-01-11 SURGICAL SUPPLY — 21 items
BAG SNAP BAND KOVER 36X36 (MISCELLANEOUS) IMPLANT
CABLE PFA RX CATH CONN (CABLE) IMPLANT
CATH EZ STEER NAV 8MM F-J CUR (ABLATOR) IMPLANT
CATH FARAWAVE ABLATION 31 (CATHETERS) IMPLANT
CATH OCTARAY 2.0 F 3-3-3-3-3 (CATHETERS) IMPLANT
CATH SOUNDSTAR ECO 8FR (CATHETERS) IMPLANT
CATH WEBSTER BI DIR CS D-F CRV (CATHETERS) IMPLANT
CLOSURE MYNX CONTROL 6F/7F (Vascular Products) IMPLANT
CLOSURE PERCLOSE PROSTYLE (VASCULAR PRODUCTS) IMPLANT
COVER SWIFTLINK CONNECTOR (BAG) ×1 IMPLANT
DILATOR VESSEL 38 20CM 16FR (INTRODUCER) IMPLANT
GUIDEWIRE INQWIRE 1.5J.035X260 (WIRE) IMPLANT
INQWIRE 1.5J .035X260CM (WIRE) ×1 IMPLANT
KIT VERSACROSS CNCT FARADRIVE (KITS) IMPLANT
PACK EP LF (CUSTOM PROCEDURE TRAY) ×1 IMPLANT
PAD DEFIB RADIO PHYSIO CONN (PAD) ×1 IMPLANT
PATCH CARTO3 (PAD) IMPLANT
SHEATH FARADRIVE STEERABLE (SHEATH) IMPLANT
SHEATH PINNACLE 8F 10CM (SHEATH) IMPLANT
SHEATH PINNACLE 9F 10CM (SHEATH) IMPLANT
SHEATH PROBE COVER 6X72 (BAG) IMPLANT

## 2024-01-11 NOTE — Discharge Instructions (Signed)

## 2024-01-11 NOTE — H&P (Signed)
  Electrophysiology Office Note:   Date:  01/11/2024  ID:  Troy [name], DOB 28-Sep-1951, MRN [id number]  Primary Cardiologist: Armanda Magic, MD Primary Heart Failure: None Electrophysiologist: Ijeoma Loor Jorja Loa, MD      History of Present Illness:   Troy [name] is a 73 y.o. male with h/o persistent atrial fibrillation, hypertension, coronary artery disease, obstructive sleep apnea seen today for routine electrophysiology followup.   Today, denies symptoms of palpitations, chest pain, shortness of breath, orthopnea, PND, lower extremity edema, claudication, dizziness, presyncope, syncope, bleeding, or neurologic sequela. The patient is tolerating medications without difficulties. Plan ablation today.   EP Information / Studies Reviewed:    EKG is not ordered today. EKG from 09/14/23 reviewed which showed atrial fibrillation        Risk Assessment/Calculations:    CHA2DS2-VASc Score = 3   This indicates a 3.2% annual risk of stroke. The patient's score is based upon: CHF History: 0 HTN History: 1 Diabetes History: 0 Stroke History: 0 Vascular Disease History: 1 Age Score: 1 Gender Score: 0            Physical Exam:   VS:  BP (!) 142/89   Pulse 74   Temp 98.6 F (37 C) (Oral)   Resp 18   Ht 5\' 11"  (1.803 m)   Wt 108.9 kg   BMI 33.47 kg/m    Wt Readings from Last 3 Encounters:  01/11/24 108.9 kg  10/28/23 106.5 kg  10/12/23 104.3 kg    GEN: No acute distress.   Neck: No JVD Cardiac: irregular.  Respiratory: decreased BS bases bilaterally. GI: Soft, nontender, non-distended  MS: No edema; No deformity. Neuro:  Nonfocal  Skin: warm and dry Psych: Normal affect    ASSESSMENT AND PLAN:    1.  Longstanding persistent atrial fibrillation: Troy [name] has presented today for surgery, with the diagnosis of AF.  The various methods of treatment have been discussed with the patient and family. After consideration of risks, benefits and other options for  treatment, the patient has consented to  Procedure(s): Catheter ablation as a surgical intervention .  Risks include but not limited to complete heart block, stroke, esophageal damage, nerve damage, bleeding, vascular damage, tamponade, perforation, MI, and death. The patient's history has been reviewed, patient examined, no change in status, stable for surgery.  I have reviewed the patient's chart and labs.  Questions were answered to the patient's satisfaction.    Rahima Fleishman Elberta Fortis, MD 01/11/2024 6:54 AM

## 2024-01-11 NOTE — Anesthesia Procedure Notes (Signed)
 Procedure Name: Intubation Date/Time: 01/11/2024 7:54 AM  Performed by: Orlin Hilding, CRNAPre-anesthesia Checklist: Patient identified, Emergency Drugs available, Suction available, Patient being monitored and Timeout performed Patient Re-evaluated:Patient Re-evaluated prior to induction Oxygen Delivery Method: Circle system utilized Preoxygenation: Pre-oxygenation with 100% oxygen Induction Type: IV induction Ventilation: Mask ventilation without difficulty and Oral airway inserted - appropriate to patient size Laryngoscope Size: Mac and 4 Grade View: Grade I Tube type: Oral Tube size: 7.5 mm Number of attempts: 1 Placement Confirmation: ETT inserted through vocal cords under direct vision, positive ETCO2 and breath sounds checked- equal and bilateral Secured at: 23 cm Tube secured with: Tape Dental Injury: Teeth and Oropharynx as per pre-operative assessment

## 2024-01-11 NOTE — Anesthesia Postprocedure Evaluation (Signed)
 Anesthesia Post Note  Patient: Troy [name]  Procedure(s) Performed: ATRIAL FIBRILLATION ABLATION TRANSESOPHAGEAL ECHOCARDIOGRAM     Patient location during evaluation: PACU Anesthesia Type: General Level of consciousness: awake and alert and oriented Pain management: pain level controlled Vital Signs Assessment: post-procedure vital signs reviewed and stable Respiratory status: spontaneous breathing, nonlabored ventilation and respiratory function stable Cardiovascular status: blood pressure returned to baseline and stable Postop Assessment: no apparent nausea or vomiting Anesthetic complications: no   There were no known notable events for this encounter.  Last Vitals:  Vitals:   01/11/24 1050 01/11/24 1055  BP:    Pulse: (!) 59 61  Resp: 13 (!) 21  Temp:    SpO2: 97% 93%    Last Pain:  Vitals:   01/11/24 1045  TempSrc: Temporal  PainSc: 0-No pain                 Wilkin Lippy A.

## 2024-01-11 NOTE — Transfer of Care (Signed)
 Immediate Anesthesia Transfer of Care Note  Patient: Troy [name]  Procedure(s) Performed: ATRIAL FIBRILLATION ABLATION TRANSESOPHAGEAL ECHOCARDIOGRAM  Patient Location: PACU and Cath Lab  Anesthesia Type:General  Level of Consciousness: drowsy and responds to stimulation  Airway & Oxygen Therapy: Patient Spontanous Breathing and Patient connected to nasal cannula oxygen  Post-op Assessment: Report given to RN and Post -op Vital signs reviewed and stable  Post vital signs: Reviewed and stable  Last Vitals:  Vitals Value Taken Time  BP    Temp    Pulse 65 01/11/24 1013  Resp 16 01/11/24 1013  SpO2 98 % 01/11/24 1013  Vitals shown include unfiled device data.  Last Pain:  Vitals:   01/11/24 0615  TempSrc: Oral  PainSc: 0-No pain         Complications: There were no known notable events for this encounter.

## 2024-01-11 NOTE — Progress Notes (Signed)
*  PRELIMINARY RESULTS* Echocardiogram Echocardiogram Transesophageal has been performed.  Earlie Server Esaw Knippel 01/11/2024, 8:25 AM

## 2024-01-12 ENCOUNTER — Telehealth (HOSPITAL_COMMUNITY): Payer: Self-pay

## 2024-01-12 ENCOUNTER — Encounter: Payer: Self-pay | Admitting: Internal Medicine

## 2024-01-12 LAB — POCT ACTIVATED CLOTTING TIME: Activated Clotting Time: 337 s

## 2024-01-12 NOTE — Telephone Encounter (Signed)
 Attempted to reach patient to follow up with procedure completed on 01/11/24, no answer. Left VM for patient to return call.

## 2024-01-12 NOTE — Telephone Encounter (Signed)
 Spoke with patient to complete post procedure follow up call.  Patient reports no complications with groin sites.   Instructions reviewed with patient:  Remove large bandage at puncture site after 24 hours. It is normal to have bruising, tenderness and a pea or marble sized lump/knot at the groin site which can take up to three months to resolve.  Get help right away if you notice sudden swelling at the puncture site.  Check your puncture site every day for signs of infection: fever, redness, swelling, pus drainage, warmth, foul odor or excessive pain. If this occurs, please call the office at [phone], to speak with the nurse. Get help right away if your puncture site is bleeding and the bleeding does not stop after applying firm pressure to the area.  You may continue to have skipped beats/ atrial fibrillation during the first several months after your procedure.  It is very important not to miss any doses of your blood thinner Wafarin. Patient restarted taking this medication on yesterday, 01/11/24.   You will follow up with the Afib clinic on 02/08/24 and  the APP on 04/12/24.   Patient verbalized understanding to all instructions provided.

## 2024-01-13 DIAGNOSIS — N5201 Erectile dysfunction due to arterial insufficiency: Secondary | ICD-10-CM | POA: Diagnosis not present

## 2024-01-14 ENCOUNTER — Encounter: Payer: Self-pay | Admitting: Emergency Medicine

## 2024-02-01 DIAGNOSIS — I480 Paroxysmal atrial fibrillation: Secondary | ICD-10-CM | POA: Diagnosis not present

## 2024-02-01 DIAGNOSIS — Z7901 Long term (current) use of anticoagulants: Secondary | ICD-10-CM | POA: Diagnosis not present

## 2024-02-01 DIAGNOSIS — J302 Other seasonal allergic rhinitis: Secondary | ICD-10-CM | POA: Diagnosis not present

## 2024-02-08 ENCOUNTER — Ambulatory Visit (HOSPITAL_COMMUNITY)
Admit: 2024-02-08 | Discharge: 2024-02-08 | Disposition: A | Discharge status: Home / Self Care | Attending: Internal Medicine | Admitting: Internal Medicine

## 2024-02-08 VITALS — BP 180/96 | HR 65 | Ht 71.0 in | Wt 235.0 lb

## 2024-02-08 DIAGNOSIS — Z79899 Other long term (current) drug therapy: Secondary | ICD-10-CM | POA: Diagnosis not present

## 2024-02-08 DIAGNOSIS — I2510 Atherosclerotic heart disease of native coronary artery without angina pectoris: Secondary | ICD-10-CM | POA: Insufficient documentation

## 2024-02-08 DIAGNOSIS — D6869 Other thrombophilia: Secondary | ICD-10-CM | POA: Insufficient documentation

## 2024-02-08 DIAGNOSIS — G4733 Obstructive sleep apnea (adult) (pediatric): Secondary | ICD-10-CM | POA: Diagnosis not present

## 2024-02-08 DIAGNOSIS — Z7901 Long term (current) use of anticoagulants: Secondary | ICD-10-CM | POA: Diagnosis not present

## 2024-02-08 DIAGNOSIS — I4811 Longstanding persistent atrial fibrillation: Secondary | ICD-10-CM | POA: Insufficient documentation

## 2024-02-08 NOTE — Progress Notes (Addendum)
 Primary Care Physician: Alysia Penna, MD Primary Cardiologist: Armanda Magic, MD Electrophysiologist: Will Jorja Loa, MD     Referring Physician: Dr. Lorenso Courier Binford is a 73 y.o. male with a history of CAD, OSA on CPAP, HLD, HTN, and atrial fibrillation who presents for consultation in the [facility]. Patient is on coumadin for a CHADS2VASC score of 3.  On evaluation today, patient is currently in NSR. S/p Afib ablation on 01/11/24 by Dr. Elberta Fortis. No episodes of Afib since ablation. Unfortunately, his sister passed away 2 days ago and he has significant stress/emotion due to her recent passing. His BP is elevated today likely secondary to stress; home BP readings are around 140/70. No chest pain or SOB. Leg sites healed without issue. No missed doses of anticoagulant.  Today, he denies symptoms of orthopnea, PND, lower extremity edema, dizziness, presyncope, syncope, snoring, daytime somnolence, bleeding, or neurologic sequela. The patient is tolerating medications without difficulties and is otherwise without complaint today.    he has a BMI of Body mass index is 32.78 kg/m.Marland Kitchen Filed Weights   02/08/24 1120  Weight: 106.6 kg    Current Outpatient Medications  Medication Sig Dispense Refill   ALPRAZolam (XANAX) 1 MG tablet Take 1 mg by mouth as needed.     Ascorbic Acid (VITAMIN C PO) Take 400 mg by mouth daily.     losartan (COZAAR) 100 MG tablet TAKE 1 TABLET(100 MG) BY MOUTH DAILY 90 tablet 3   metoprolol tartrate (LOPRESSOR) 100 MG tablet TAKE 1 TABLET(100 MG) BY MOUTH TWICE DAILY 180 tablet 3   niacin (VITAMIN B3) 100 MG tablet 1 tablet with food Orally Once a day     sildenafil (VIAGRA) 50 MG tablet Take 50 mg by mouth daily as needed.     TURMERIC PO Take 100 mg by mouth daily as needed (Muscle and joint pain).     warfarin (COUMADIN) 5 MG tablet TAKE AS DIRECTED BY ANTICOAGULATION CLINIC (Patient taking differently: Take 7.5-10  mg by mouth See admin instructions. Take 10 mg on Sunday, Tuesday and Thursday, Take 7.5 mg on Monday, Wednesday , Friday and Saturday) 150 tablet 1   No current facility-administered medications for this encounter.    Atrial Fibrillation Management history:  Previous antiarrhythmic drugs:  Previous cardioversions: 2018 Previous ablations: 01/11/24 Anticoagulation history: coumadin   ROS- All systems are reviewed and negative except as per the HPI above.  Physical Exam: BP (!) 180/96   Pulse 65   Ht 5\' 11"  (1.803 m)   Wt 106.6 kg   BMI 32.78 kg/m   GEN: Well nourished, well developed in no acute distress NECK: No JVD; No carotid bruits CARDIAC: Regular rate and rhythm, no murmurs, rubs, gallops RESPIRATORY:  Clear to auscultation without rales, wheezing or rhonchi  ABDOMEN: Soft, non-tender, non-distended EXTREMITIES:  No edema; No deformity   EKG today demonstrates  Vent. rate 65 BPM PR interval 180 ms QRS duration 98 ms QT/QTcB 408/424 ms P-R-T axes 48 -31 -13 Normal sinus rhythm Left axis deviation T wave abnormality, consider inferior ischemia Abnormal ECG When compared with ECG of 11-Jan-2024 10:21, PREVIOUS ECG IS PRESENT  Echo 07/11/22 demonstrated  1. Distal inferior hypokinesis. Left ventricular ejection fraction, by  estimation, is 55 to 60%. The left ventricle has normal function. The left  ventricle has no regional wall motion abnormalities. The left ventricular  internal cavity size was mildly  dilated. There is mild left ventricular hypertrophy.  Left ventricular  diastolic parameters are indeterminate.   2. Right ventricular systolic function is normal. The right ventricular  size is normal.   3. Left atrial size was severely dilated.   4. Right atrial size was mildly dilated.   5. Mild mitral valve regurgitation.   6. The aortic valve is tricuspid. Aortic valve regurgitation is mild.  Aortic valve sclerosis/calcification is present, without any  evidence of  aortic stenosis.   7. The inferior vena cava is normal in size with greater than 50%  respiratory variability, suggesting right atrial pressure of 3 mmHg.   ASSESSMENT & PLAN CHA2DS2-VASc Score = 3  The patient's score is based upon: CHF History: 0 HTN History: 1 Diabetes History: 0 Stroke History: 0 Vascular Disease History: 1 Age Score: 1 Gender Score: 0       ASSESSMENT AND PLAN: Longstanding Persistent Atrial Fibrillation (ICD10:  I48.11) The patient's CHA2DS2-VASc score is 3, indicating a 3.2% annual risk of stroke.   S/p Afib ablatin on 01/11/24 by Dr. Elberta Fortis.  He is currently in NSR.   Continue Lopressor 100 mg BID.    Secondary Hypercoagulable State (ICD10:  D68.69) The patient is at significant risk for stroke/thromboembolism based upon his CHA2DS2-VASc Score of 3.  Continue Warfarin (Coumadin).  Continue coumadin as directed. He asked about coming off anticoagulation. He chose coumadin due to price. We discussed ILR implantation and Watchman procedure briefly.      Follow up with EP as scheduled.    Lake Bells, PA-C  Afib Clinic [facility] [address] [place], [place] [address] [phone]

## 2024-03-07 DIAGNOSIS — I10 Essential (primary) hypertension: Secondary | ICD-10-CM | POA: Diagnosis not present

## 2024-03-07 DIAGNOSIS — Z7901 Long term (current) use of anticoagulants: Secondary | ICD-10-CM | POA: Diagnosis not present

## 2024-03-07 DIAGNOSIS — I480 Paroxysmal atrial fibrillation: Secondary | ICD-10-CM | POA: Diagnosis not present

## 2024-03-07 DIAGNOSIS — J302 Other seasonal allergic rhinitis: Secondary | ICD-10-CM | POA: Diagnosis not present

## 2024-04-11 NOTE — Progress Notes (Signed)
  Electrophysiology Office Note:   Date:  04/12/2024  ID:  Dastan Krider, DOB 1950-12-30, MRN [id number]  Primary Cardiologist: Gaylyn Keas, MD Electrophysiologist: Lei Pump, MD      History of Present Illness:   Troy [name] is a 73 y.o. male with h/o CAD, OSA on CPAP, HLD, HTN, and atrial fibrillation  seen today for routine electrophysiology follow-up s/p Ablation.  Since last being seen in our clinic the patient reports doing very well. Has mild tachycardia when playing pickle ball, but no AF by symptoms or watch. Otherwise, he denies chest pain, palpitations, dyspnea, PND, orthopnea, nausea, vomiting, dizziness, syncope, edema, weight gain, or early satiety.    Review of systems complete and found to be negative unless listed in HPI.   EP Information / Studies Reviewed:    EKG is ordered today. Personal review as below.  EKG Interpretation Date/Time:  Tuesday April 12 2024 10:40:52 EDT Ventricular Rate:  75 PR Interval:  184 QRS Duration:  106 QT Interval:  440 QTC Calculation: 491 R Axis:   -51  Text Interpretation: Normal sinus rhythm Left axis deviation Incomplete right bundle branch block Moderate voltage criteria for LVH, may be normal variant ( R in aVL , Cornell product ) Prolonged QT When compared with ECG of 08-Feb-2024 11:29, Incomplete right bundle branch block is now Present Confirmed by Pilar Bridge [phone]) on 04/12/2024 10:53:51 AM    Arrhythmia/Device History S/p PVI 01/11/2024   Physical Exam:   VS:  BP (!) 148/94   Pulse 75   Ht 5\' 11"  (1.803 m)   Wt 242 lb 8 oz (110 kg)   SpO2 95%   BMI 33.82 kg/m    Wt Readings from Last 3 Encounters:  04/12/24 242 lb 8 oz (110 kg)  02/08/24 235 lb (106.6 kg)  01/11/24 240 lb (108.9 kg)     GEN: No acute distress NECK: No JVD; No carotid bruits CARDIAC: Regular rate and rhythm, no murmurs, rubs, gallops RESPIRATORY:  Clear to auscultation without rales, wheezing or rhonchi  ABDOMEN: Soft,  non-tender, non-distended EXTREMITIES:  No edema; No deformity   ASSESSMENT AND PLAN:    Persistent AF S/p ablation 01/11/2024 EKG today shows NSR Continue lopressor  100 mg BID Continue coumadin  for CHA2DS2/VASc of at least 4.   Secondary hypercoagulable state Pt on coumadin  as above  Can consider NOAC.  Can consider LAAO  CAD No s/s of ischemia.      Follow up with EP APP in 6 months  Signed, Tylene Galla, PA-C

## 2024-04-12 ENCOUNTER — Encounter: Payer: Self-pay | Admitting: Student

## 2024-04-12 ENCOUNTER — Ambulatory Visit: Attending: Student | Admitting: Student

## 2024-04-12 VITALS — BP 148/94 | HR 75 | Ht 71.0 in | Wt 242.5 lb

## 2024-04-12 DIAGNOSIS — I4811 Longstanding persistent atrial fibrillation: Secondary | ICD-10-CM | POA: Diagnosis not present

## 2024-04-12 DIAGNOSIS — D6869 Other thrombophilia: Secondary | ICD-10-CM | POA: Insufficient documentation

## 2024-04-12 DIAGNOSIS — I2510 Atherosclerotic heart disease of native coronary artery without angina pectoris: Secondary | ICD-10-CM | POA: Diagnosis not present

## 2024-04-12 NOTE — Patient Instructions (Signed)
 Medication Instructions:  No medication changes today. *If you need a refill on your cardiac medications before your next appointment, please call your pharmacy*  Lab Work: No labwork ordered today. If you have labs (blood work) drawn today and your tests are completely normal, you will receive your results only by: MyChart Message (if you have MyChart) OR A paper copy in the mail If you have any lab test that is abnormal or we need to change your treatment, we will call you to review the results.  Testing/Procedures: No testing ordered today  Follow-Up: At [facility], you and your health needs are our priority.  As part of our continuing mission to provide you with exceptional heart care, our providers are all part of one team.  This team includes your primary Cardiologist (physician) and Advanced Practice Providers or APPs (Physician Assistants and Nurse Practitioners) who all work together to provide you with the care you need, when you need it.  Your next appointment:   6 month(s)  Provider:   You may see Will Cortland Ding, MD or one of the following Advanced Practice Providers on your designated Care Team:   Mertha Abrahams, [place] [address] [place], PA-C Suzann Riddle, NP Creighton Doffing, NP    We recommend signing up for the patient portal called "MyChart".  Sign up information is provided on this After Visit Summary.  MyChart is used to connect with patients for Virtual Visits (Telemedicine).  Patients are able to view lab/test results, encounter notes, upcoming appointments, etc.  Non-urgent messages can be sent to your provider as well.   To learn more about what you can do with MyChart, go to [email or web address].

## 2024-05-09 DIAGNOSIS — H1789 Other corneal scars and opacities: Secondary | ICD-10-CM | POA: Diagnosis not present

## 2024-05-09 DIAGNOSIS — H5213 Myopia, bilateral: Secondary | ICD-10-CM | POA: Diagnosis not present

## 2024-05-09 DIAGNOSIS — H31001 Unspecified chorioretinal scars, right eye: Secondary | ICD-10-CM | POA: Diagnosis not present

## 2024-05-09 DIAGNOSIS — Z961 Presence of intraocular lens: Secondary | ICD-10-CM | POA: Diagnosis not present

## 2024-05-24 ENCOUNTER — Telehealth: Payer: Self-pay | Admitting: Cardiology

## 2024-05-24 ENCOUNTER — Other Ambulatory Visit: Payer: Self-pay | Admitting: Cardiology

## 2024-05-24 MED ORDER — SPIRONOLACTONE 25 MG PO TABS: 25.0000 mg | ORAL_TABLET | Freq: Every day | ORAL | 3 refills | Status: AC

## 2024-05-24 NOTE — Telephone Encounter (Signed)
 Pt c/o medication issue:  1. Name of Medication: Spironolactone   25 mg Tablet 2. How are you currently taking this medication (dosage and times per day)? 1 tablet a day   3. Are you having a reaction (difficulty breathing--STAT)? No   4. What is your medication issue? Pt doesn't remember if he took his self off this medication or the doctor did it. But he has started taking it again.

## 2024-05-24 NOTE — Telephone Encounter (Signed)
 Pt restarted Spironolactone  about a month ago after being off of it since November 2024. SBP 140s prior to restarting Spironolactone . After restarting, SBP is now back in the 120s and pt feels good. Pt had called PCP to see if they would refill since it looks like they originally prescribed the medication but they won't refill it. Pt wanted to know if Dr. Inocencio would refill since he has seen such an improvement in his blood pressure. Explained to pt that I will forward to Dr. Inocencio and his nurse.

## 2024-05-24 NOTE — Telephone Encounter (Signed)
 Spoke with pt and let him know that Dr. Inocencio said we can refill Spironolactone . Pt thanked us  for our help and had no further questions or concerns.

## 2024-08-10 DIAGNOSIS — S6992XA Unspecified injury of left wrist, hand and finger(s), initial encounter: Secondary | ICD-10-CM | POA: Diagnosis not present

## 2024-08-10 DIAGNOSIS — M19042 Primary osteoarthritis, left hand: Secondary | ICD-10-CM | POA: Diagnosis not present

## 2024-08-10 DIAGNOSIS — I10 Essential (primary) hypertension: Secondary | ICD-10-CM | POA: Diagnosis not present

## 2024-08-10 DIAGNOSIS — M19041 Primary osteoarthritis, right hand: Secondary | ICD-10-CM | POA: Diagnosis not present

## 2024-08-10 DIAGNOSIS — E785 Hyperlipidemia, unspecified: Secondary | ICD-10-CM | POA: Diagnosis not present

## 2024-09-19 ENCOUNTER — Other Ambulatory Visit: Payer: Self-pay | Admitting: Thoracic Surgery (Cardiothoracic Vascular Surgery)

## 2024-09-19 DIAGNOSIS — I77810 Thoracic aortic ectasia: Secondary | ICD-10-CM

## 2024-10-03 ENCOUNTER — Telehealth: Payer: Self-pay | Admitting: *Deleted

## 2024-10-03 ENCOUNTER — Other Ambulatory Visit: Payer: Self-pay

## 2024-10-03 MED ORDER — DIAZEPAM 2 MG PO TABS: 2.0000 mg | ORAL_TABLET | Freq: Four times a day (QID) | ORAL | 0 refills | Status: AC | PRN

## 2024-10-03 NOTE — Progress Notes (Signed)
-  Take one dose of valium  prior to CT scan for anxiety -Sent to pharmacy on file

## 2024-10-03 NOTE — Telephone Encounter (Signed)
 Patient requesting medication for anxiety prior to CT scan. Message left for patient regarding usage of medication.

## 2024-10-05 ENCOUNTER — Ambulatory Visit (HOSPITAL_COMMUNITY)
Admission: RE | Admit: 2024-10-05 | Discharge: 2024-10-05 | Disposition: A | Discharge status: Home / Self Care | Source: Ambulatory Visit | Attending: Cardiology | Admitting: Cardiology

## 2024-10-05 DIAGNOSIS — I77810 Thoracic aortic ectasia: Secondary | ICD-10-CM | POA: Insufficient documentation

## 2024-10-05 DIAGNOSIS — I700 Atherosclerosis of aorta: Secondary | ICD-10-CM | POA: Diagnosis not present

## 2024-10-05 DIAGNOSIS — I7121 Aneurysm of the ascending aorta, without rupture: Secondary | ICD-10-CM | POA: Diagnosis not present

## 2024-10-11 ENCOUNTER — Ambulatory Visit

## 2024-10-11 VITALS — BP 142/88 | HR 85 | Resp 20 | Ht 71.0 in | Wt 250.0 lb

## 2024-10-11 DIAGNOSIS — I77810 Thoracic aortic ectasia: Secondary | ICD-10-CM | POA: Insufficient documentation

## 2024-10-11 NOTE — Patient Instructions (Addendum)

## 2024-10-11 NOTE — Progress Notes (Signed)
 [address] [place] [address]             [phone]            Troy [name] [id number] 1951-01-09   History of Present Illness:  Troy [name] is a 73 year old man with medical history of hypertension, persistent atrial fibrillation, CAD, mitral regurgitation, osteoarthritis,  and hyperlipidemia who presents for continued follow up of ascending thoracic aortic aneurysm. He has been followed by our clinic since 2022 and aneurysm has measured from 4.2 cm - 4.5 cm.  On recent CT of chest aneurysm measured 4.2 cm.  Echocardiogram on 01/2024 showed that the aortic valve was normal in structure.   He presents to the clinic today and reports that he has been doing well.  His blood pressure is elevated at today's visit which he attributes to medical anxiety.  He checks his blood pressure at home and readings are 120s/70s.  He tries to be active with playing pickle ball but hasn't been playing recently due to injury.  He denies heavy lifting.  He denies chest pain, shortness of breath and lower leg edema.     Current Outpatient Medications on File Prior to Visit  Medication Sig Dispense Refill   ALPRAZolam  (XANAX ) 1 MG tablet Take 1 mg by mouth as needed.     diazepam  (VALIUM ) 2 MG tablet Take 1 tablet (2 mg total) by mouth every 6 (six) hours as needed for up to 1 dose for anxiety. Take one tablet prior to CT scan for anxiety 1 tablet 0   losartan  (COZAAR ) 100 MG tablet TAKE 1 TABLET(100 MG) BY MOUTH DAILY 90 tablet 3   sildenafil (VIAGRA) 50 MG tablet Take 50 mg by mouth daily as needed.     spironolactone  (ALDACTONE ) 25 MG tablet Take 1 tablet (25 mg total) by mouth daily. 90 tablet 3   TURMERIC PO Take 100 mg by mouth daily as needed (Muscle and joint pain).     No current facility-administered medications on file prior to visit.     ROS: Review of Systems  Constitutional: Negative.  Negative for malaise/fatigue.  Respiratory:  Negative for  cough and shortness of breath.   Cardiovascular: Negative.  Negative for chest pain, palpitations and leg swelling.     BP (!) 142/88   Pulse 85   Resp 20   Ht 5' 11 (1.803 m)   Wt 250 lb (113.4 kg)   SpO2 97% Comment: RA  BMI 34.87 kg/m   Physical Exam Constitutional:      Appearance: Normal appearance.  HENT:     Head: Normocephalic and atraumatic.  Cardiovascular:     Rate and Rhythm: Normal rate and regular rhythm.     Heart sounds: Normal heart sounds, S1 normal and S2 normal.  Pulmonary:     Effort: Pulmonary effort is normal.     Breath sounds: Normal breath sounds.  Skin:    General: Skin is warm and dry.  Neurological:     General: No focal deficit present.     Mental Status: He is alert and oriented to person, place, and time.      Imaging: CLINICAL DATA:  Ascending thoracic aortic aneurysm   EXAM: CT CHEST WITHOUT CONTRAST   TECHNIQUE: Multidetector CT imaging of the chest was performed following the standard protocol without IV contrast. Assessment of the vascular structures is severely limited without IV contrast.  RADIATION DOSE REDUCTION: This exam was performed according to the departmental dose-optimization program which includes automated exposure control, adjustment of the mA and/or kV according to patient size and/or use of iterative reconstruction technique.   COMPARISON:  10/06/2023   FINDINGS: Cardiovascular: Unenhanced imaging of the heart is unremarkable without pericardial effusion. Stable calcifications of the mitral and aortic valves.   Fusiform ascending thoracic aortic aneurysm measuring up to 4.2 cm. Assessment of the vascular lumen cannot be performed without intravenous contrast. Stable atherosclerosis of the aorta and coronary vasculature.   Mediastinum/Nodes: No enlarged mediastinal or axillary lymph nodes. Thyroid  gland, trachea, and esophagus demonstrate no significant findings.   Lungs/Pleura: No acute airspace  disease, effusion, or pneumothorax. The central airways are patent.   Upper Abdomen: No acute abnormality.   Musculoskeletal: No acute or destructive bony abnormalities. Reconstructed images demonstrate no additional findings.   IMPRESSION: 1. Stable 4.2 cm ascending thoracic aortic aneurysm. Recommend annual imaging followup by CTA or MRA. This recommendation follows 2010 ACCF/AHA/AATS/ACR/ASA/SCA/SCAI/SIR/STS/SVM Guidelines for the Diagnosis and Management of Patients with Thoracic Aortic Disease. Circulation. 2010; 121: [id number]. Aortic aneurysm NOS (ICD10-I71.9) 2. No acute intrathoracic process. 3. Aortic Atherosclerosis (ICD10-I70.0). Coronary artery atherosclerosis.     Electronically Signed   By: Ozell Daring M.D.   On: 10/05/2024 17:06     A/P:  Ascending aorta dilatation -4.2 cm ascending thoracic aortic aneurysm on CTA of chest.  Echocardiogram showed tricuspid aortic valve.  -We discussed the natural history and and risk factors for growth of ascending aortic aneurysms. Discussed recommendations to minimize the risk of further expansion or dissection including careful blood pressure control, avoidance of contact sports and heavy lifting, attention to lipid management.  We covered the importance of staying never user of tobacco.  The patient does not yet meet surgical criteria of >5.5cm. The patient is aware of signs and symptoms of aortic dissection and when to present to the emergency department   -Follow up in one year with CT of chest for continued surveillance    Risk Modification:  Statin:  not currently prescribed due to intolerance  Smoking cessation instruction/counseling given:  never user  Patient was counseled on importance of Blood Pressure Control  They are instructed to contact their Primary Care Physician if they start to have blood pressure readings over 130s/90s. Do not ever stop blood pressure medications on your own, unless instructed by  healthcare professional.  Please avoid use of Fluoroquinolones as this can potentially increase your risk of Aortic Rupture and/or Dissection  Patient educated on signs and symptoms of Aortic Dissection, handout also provided in AVS  Troy CHRISTELLA Rough, PA-C 10/11/24
